# Patient Record
Sex: Male | Born: 1940 | Race: White | Hispanic: No | Marital: Married | Smoking: Former smoker
Health system: Southern US, Community
[De-identification: ages and names within clinical notes are randomized; demographics above are authoritative.]

## PROBLEM LIST (undated history)

## (undated) DIAGNOSIS — G8929 Other chronic pain: Secondary | ICD-10-CM

## (undated) DIAGNOSIS — J45909 Unspecified asthma, uncomplicated: Secondary | ICD-10-CM

## (undated) DIAGNOSIS — E785 Hyperlipidemia, unspecified: Secondary | ICD-10-CM

## (undated) DIAGNOSIS — Z955 Presence of coronary angioplasty implant and graft: Secondary | ICD-10-CM

## (undated) DIAGNOSIS — I2510 Atherosclerotic heart disease of native coronary artery without angina pectoris: Secondary | ICD-10-CM

## (undated) DIAGNOSIS — I214 Non-ST elevation (NSTEMI) myocardial infarction: Secondary | ICD-10-CM

## (undated) DIAGNOSIS — I10 Essential (primary) hypertension: Secondary | ICD-10-CM

## (undated) DIAGNOSIS — Z9861 Coronary angioplasty status: Secondary | ICD-10-CM

## (undated) DIAGNOSIS — M545 Low back pain, unspecified: Secondary | ICD-10-CM

## (undated) DIAGNOSIS — H3530 Unspecified macular degeneration: Secondary | ICD-10-CM

## (undated) DIAGNOSIS — Z9889 Other specified postprocedural states: Secondary | ICD-10-CM

## (undated) DIAGNOSIS — H35039 Hypertensive retinopathy, unspecified eye: Secondary | ICD-10-CM

## (undated) DIAGNOSIS — Z951 Presence of aortocoronary bypass graft: Secondary | ICD-10-CM

## (undated) DIAGNOSIS — R0989 Other specified symptoms and signs involving the circulatory and respiratory systems: Secondary | ICD-10-CM

## (undated) DIAGNOSIS — I25810 Atherosclerosis of coronary artery bypass graft(s) without angina pectoris: Secondary | ICD-10-CM

## (undated) HISTORY — DX: Presence of aortocoronary bypass graft: Z95.1

## (undated) HISTORY — PX: TONSILLECTOMY: SUR1361

## (undated) HISTORY — DX: Essential (primary) hypertension: I10

## (undated) HISTORY — PX: EYE SURGERY: SHX253

## (undated) HISTORY — DX: Atherosclerosis of coronary artery bypass graft(s) without angina pectoris: I25.810

## (undated) HISTORY — DX: Hypertensive retinopathy, unspecified eye: H35.039

## (undated) HISTORY — DX: Hyperlipidemia, unspecified: E78.5

## (undated) HISTORY — DX: Non-ST elevation (NSTEMI) myocardial infarction: I21.4

## (undated) HISTORY — DX: Atherosclerotic heart disease of native coronary artery without angina pectoris: Z98.61

## (undated) HISTORY — PX: CATARACT EXTRACTION: SUR2

## (undated) HISTORY — DX: Other specified symptoms and signs involving the circulatory and respiratory systems: R09.89

## (undated) HISTORY — DX: Atherosclerotic heart disease of native coronary artery without angina pectoris: I25.10

## (undated) HISTORY — DX: Low back pain, unspecified: M54.50

## (undated) HISTORY — DX: Unspecified macular degeneration: H35.30

## (undated) HISTORY — DX: Other chronic pain: G89.29

## (undated) HISTORY — DX: Low back pain: M54.5

## (undated) HISTORY — DX: Presence of coronary angioplasty implant and graft: Z95.5

## (undated) HISTORY — PX: CATARACT EXTRACTION W/ INTRAOCULAR LENS  IMPLANT, BILATERAL: SHX1307

## (undated) HISTORY — DX: Other specified postprocedural states: Z98.890

---

## 1996-03-02 DIAGNOSIS — Z951 Presence of aortocoronary bypass graft: Secondary | ICD-10-CM

## 1996-03-02 DIAGNOSIS — I2510 Atherosclerotic heart disease of native coronary artery without angina pectoris: Secondary | ICD-10-CM

## 1996-03-02 HISTORY — DX: Presence of aortocoronary bypass graft: Z95.1

## 1996-03-02 HISTORY — DX: Atherosclerotic heart disease of native coronary artery without angina pectoris: I25.10

## 1996-03-02 HISTORY — PX: CORONARY ARTERY BYPASS GRAFT: SHX141

## 1997-08-20 ENCOUNTER — Ambulatory Visit (HOSPITAL_COMMUNITY): Admission: RE | Admit: 1997-08-20 | Discharge: 1997-08-20 | Payer: Self-pay | Admitting: Cardiology

## 2001-03-02 DIAGNOSIS — I25810 Atherosclerosis of coronary artery bypass graft(s) without angina pectoris: Secondary | ICD-10-CM

## 2001-03-02 HISTORY — DX: Atherosclerosis of coronary artery bypass graft(s) without angina pectoris: I25.810

## 2001-03-02 HISTORY — PX: LEFT HEART CATH AND CORS/GRAFTS ANGIOGRAPHY: CATH118250

## 2001-03-02 HISTORY — PX: CORONARY ANGIOPLASTY WITH STENT PLACEMENT: SHX49

## 2001-09-21 ENCOUNTER — Encounter: Admission: RE | Admit: 2001-09-21 | Discharge: 2001-09-21 | Payer: Self-pay | Admitting: Cardiology

## 2001-09-21 ENCOUNTER — Encounter: Payer: Self-pay | Admitting: Cardiology

## 2001-09-26 ENCOUNTER — Ambulatory Visit (HOSPITAL_COMMUNITY): Admission: RE | Admit: 2001-09-26 | Discharge: 2001-09-27 | Payer: Self-pay | Admitting: Cardiology

## 2007-07-27 ENCOUNTER — Encounter: Admission: RE | Admit: 2007-07-27 | Discharge: 2007-07-27 | Payer: Self-pay | Admitting: Cardiology

## 2007-07-27 IMAGING — CR DG CHEST 2V
2 series · 2 of 2 positions shown · non-contrast
Comparison: None

CLINICAL DATA: Short of breath, history of CABG in [UE]

CHEST - 2 VIEW

[w chest pa]
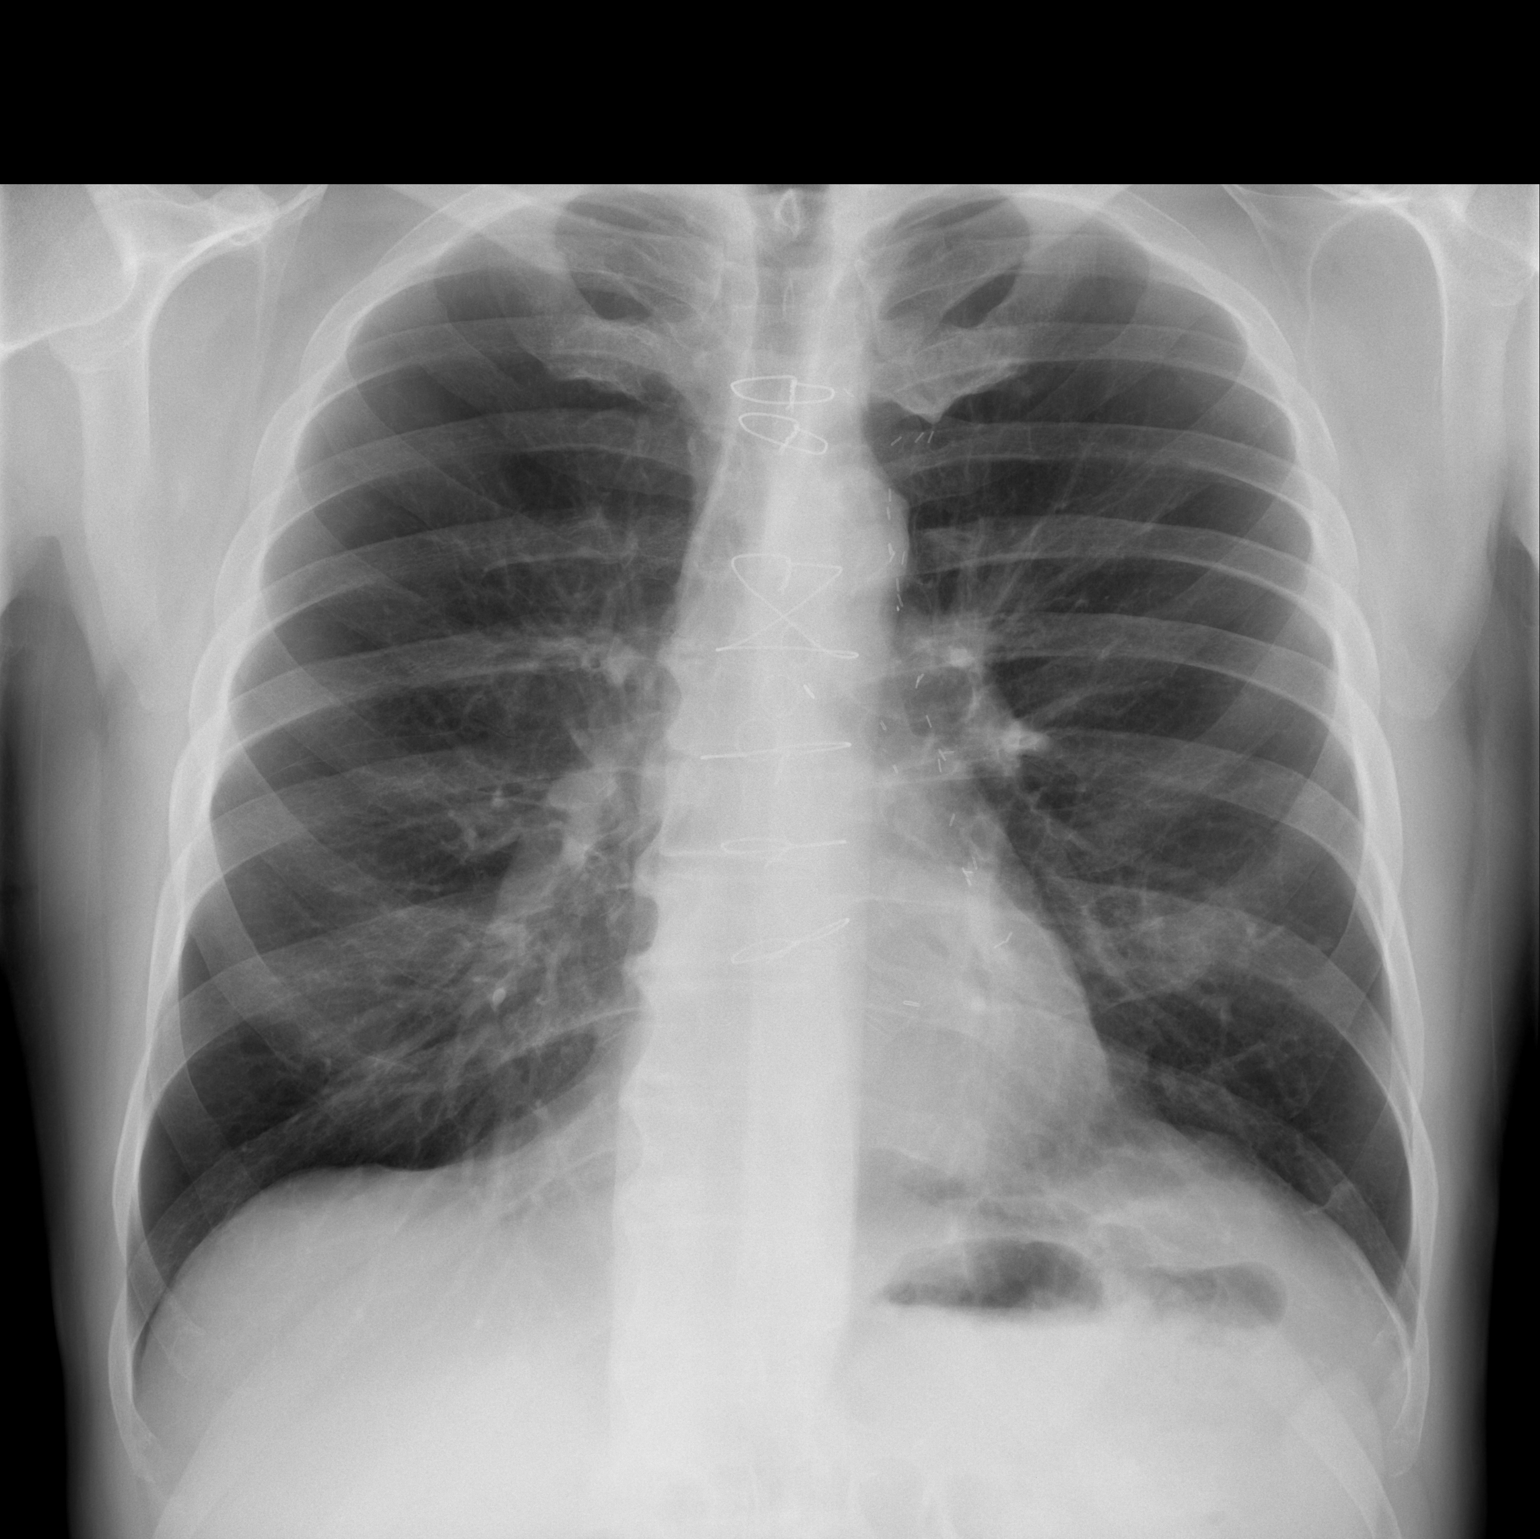

[w chest lat]
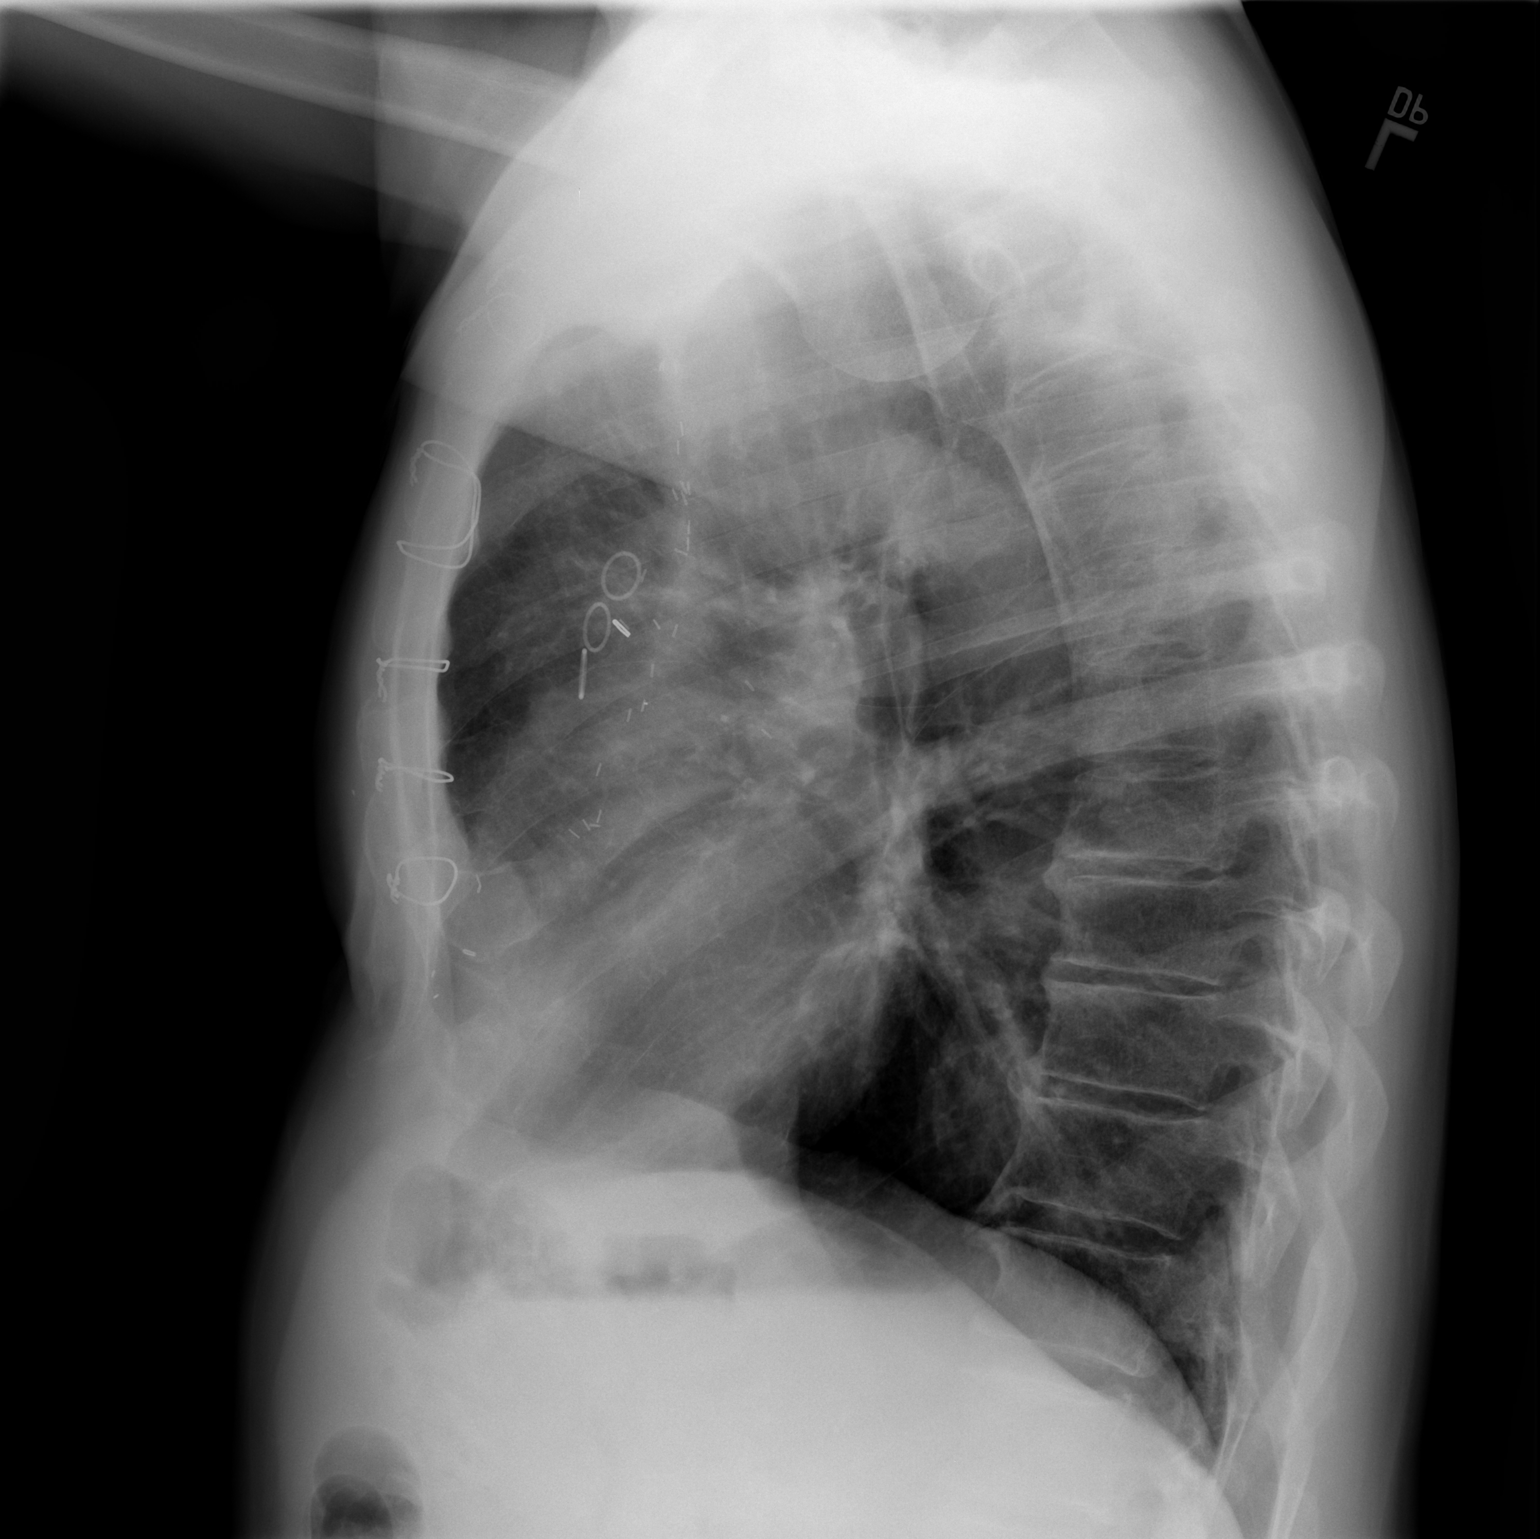

[2 of 2 positions shown; findings below may reference images not displayed]

FINDINGS: The lungs are clear but hyperaerated.  The heart is
within normal limits in size.  Median sternotomy sutures are noted
from prior CABG.  There are degenerative changes in the mid to
lower thoracic spine.
IMPRESSION: No active lung disease.  The lungs are somewhat hyperaerated.

## 2007-08-04 ENCOUNTER — Ambulatory Visit: Payer: Self-pay | Admitting: Internal Medicine

## 2010-07-18 NOTE — Cardiovascular Report (Signed)
NAME:  Xavier Giles, Xavier Giles                          ACCOUNT NO.:  [id number]   MEDICAL RECORD NO.:  [id number]                   PATIENT TYPE:  OIB   LOCATION:  6532                                 FACILITY:  [facility]   PHYSICIAN:  Thereasa Solo. Little, M.D.              DATE OF BIRTH:  12/06/1940   DATE OF PROCEDURE:  09/26/2001  DATE OF DISCHARGE:  09/27/2001                              CARDIAC CATHETERIZATION   INDICATIONS:  The patient is a 70 year old male who had bypass surgery five  years ago.  He has had increasing episodes of tiredness and fatigue.  He  underwent a nuclear study which was positive for anterolateral ischemia.  Because of this, he was brought in for outpatient cardiac catheterization  and graft visualization.   DESCRIPTION OF PROCEDURE:  The patient was prepped and draped in the usual  sterile fashion, exposing the right groin.  Following local anesthetic with  1% Xylocaine, a 5 French introducer sheath was placed into the right femoral  artery.  Selective left and right coronary arteriography and graft  visualization using a right coronary catheter was performed.   Ventriculography in the RAO projection was performed.   RESULTS:  1. Hemodynamic monitoring:  Central aortic pressure 147/74, left ventricular     pressure 147/15 with no aortic valve gradient noted at the time of     pullback.  2. Ventriculography:  Ventriculography in the RAO projection using 25 cc of     contrast at 12 cc/sec revealed good opacification of the left ventricle.     The posterior basilar and inferior walls were hypokinetic.  The ejection     fraction was 45-50%.  The end-diastolic pressure was 15.   CORONARY ARTERIOGRAPHY:  On fluoroscopy, there was calcification in the  distribution of the left main and LAD.  1. Left main normal.  2. LAD:  In in the distal portion of the LAD there was bidirectional flow     and no visualization of the distal portion of the vessel.  There was 60%  irregularity in the proximal portion.  The first diagonal was well     visualized with 50% proximal and 80% distal narrowing.  3. Circumflex:  The circumflex was a large vessel that stayed in the AV     groove.  OM1 and OM2 were faintly visualized late and retrograde from     left-to-left collaterals.  They were totally occluded proximally.  Small     OM3 and OM4 were well visualized.  Mild 40% or less irregularities in the     AV groove were noted.  4. Right coronary artery:  The right coronary artery was 100% occluded     proximally with skipped collaterals to the distal RCA and the PDA.  There     were also a large left-to-right collateral beds of the PDA.   GRAFT VISUALIZATION:  1. Saphenous vein graft to  the diagonal:  The graft itself was widely     patent.  Just distal to the insertion site, the saphenous vein graft in     the diagonal itself was an area of 80% narrowing.  There was retrograde     filling of the LAD and circumflex via this bypass graft.  2. Saphenous vein graft to the OM:  In the distal portion of the graft just     before the anastomotic site and the anastomotic site showed an area of     90% narrowing.  The OM vessel itself was free of significant disease and     was about the same size as the bypass graft.  This graft was sequential     and OM2 is where the problem was.  OM1 was a relatively small vessel and     this anastomotic site was fine.  3. Saphenous vein graft to the PDA:  This graft was never visualized.  It     appeared to be 100% occluded at the ostium.  4. Left internal mammary artery to the LAD:  The internal mammary artery     itself was widely patent.  The LAD itself had 40% irregularities with     some left-to-left collaterals filling some faint OM's.   Because of the high-grade stenosis in both the saphenous vein graft to the  OM and in the diagonal itself, arrangements were made for intervention.  The  entire case time was 2 hours and 15  minutes.   Initially the 5 French sheath was changed out for a 6 [place] sheath.  A left  coronary bypass catheter was tried, but gave very poor backup.  A right  coronary bypass catheter also gave poor backup.  With attempts at using a J-  tipped short Luque wire, the wire could easily be passed down the vessel,  but each time the stent was placed the guide catheters would back out.  Finally a Medtronic hockey stick guide catheter was used and a Art therapist.  The wire was placed down the distal portion of the vessel.  At this point, a  3.0 x 8 Zeta stent was made ready.  It was placed in such a manner that it  covered the area of narrowing in the distal portion of the saphenous vein  graft to the OM and actually extended into the proximal portion of OM2.  Once appropriate placement was visualized, the stent was deployed at 10 x 62  and finally 13 x 62.  After stent placement, the vessel appeared to be  slightly hyperexpanded at the stent site.  It had been 90% previous.  There  was brisk TIMI-3 pre and post and there was no evidence of any dissection or  thrombus formation.   At this point, we decided to proceed on with treatment of the diagonal  itself.  The same hockey stick guide catheter was used and the Sport wire.  The wire was placed down the saphenous vein graft to the diagonal and into  the distal portion of the diagonal.  The cutting balloon was placed through  the stent into the diagonal and a total of four inflations ranging from 6 x  60-8 x 62 were performed.  This area of 90% narrowing that was initially  seen now appeared to be normal post angioplasty.  There was brisk distal  flow and no dissection of thrombus.   Angiomax was used during the case.  Even with inflations,  the patient had no  symptoms.  A total of 300 cc of contrast media was used.  The sheath will be  removed later today.                                               Thereasa Solo. Little, M.D.    ABL/MEDQ   D:  09/26/2001  T:  10/03/2001  Job:  [id number]   cc:   Catheterization Laboratory   Kathlee Nations Suann Larry, M.D.   Ronnald Nian, M.D.

## 2010-12-29 ENCOUNTER — Telehealth: Payer: Self-pay | Admitting: Internal Medicine

## 2010-12-29 NOTE — Telephone Encounter (Signed)
Pt hasnt not been here in years. No current labs we have for the SE heart for his appt

## 2011-03-26 DIAGNOSIS — E782 Mixed hyperlipidemia: Secondary | ICD-10-CM | POA: Diagnosis not present

## 2011-06-10 DIAGNOSIS — I2510 Atherosclerotic heart disease of native coronary artery without angina pectoris: Secondary | ICD-10-CM | POA: Diagnosis not present

## 2011-06-10 DIAGNOSIS — E782 Mixed hyperlipidemia: Secondary | ICD-10-CM | POA: Diagnosis not present

## 2011-06-10 DIAGNOSIS — I10 Essential (primary) hypertension: Secondary | ICD-10-CM | POA: Diagnosis not present

## 2011-09-22 DIAGNOSIS — L0291 Cutaneous abscess, unspecified: Secondary | ICD-10-CM | POA: Diagnosis not present

## 2011-10-14 DIAGNOSIS — I2510 Atherosclerotic heart disease of native coronary artery without angina pectoris: Secondary | ICD-10-CM | POA: Diagnosis not present

## 2011-10-14 DIAGNOSIS — I10 Essential (primary) hypertension: Secondary | ICD-10-CM | POA: Diagnosis not present

## 2012-01-01 DIAGNOSIS — R0989 Other specified symptoms and signs involving the circulatory and respiratory systems: Secondary | ICD-10-CM

## 2012-01-01 DIAGNOSIS — I214 Non-ST elevation (NSTEMI) myocardial infarction: Secondary | ICD-10-CM

## 2012-01-01 HISTORY — DX: Non-ST elevation (NSTEMI) myocardial infarction: I21.4

## 2012-01-01 HISTORY — DX: Other specified symptoms and signs involving the circulatory and respiratory systems: R09.89

## 2012-01-13 ENCOUNTER — Encounter (HOSPITAL_COMMUNITY)
Admission: EM | Disposition: A | Discharge status: Home / Self Care | Payer: Self-pay | Source: Home / Self Care | Attending: Cardiovascular Disease

## 2012-01-13 ENCOUNTER — Inpatient Hospital Stay (HOSPITAL_COMMUNITY)
Admission: EM | Admit: 2012-01-13 | Discharge: 2012-01-15 | DRG: 247 | Disposition: A | Discharge status: Home / Self Care | Payer: Medicare Other | Attending: Cardiovascular Disease | Admitting: Cardiovascular Disease

## 2012-01-13 ENCOUNTER — Emergency Department (HOSPITAL_COMMUNITY): Payer: Medicare Other

## 2012-01-13 ENCOUNTER — Encounter (HOSPITAL_COMMUNITY): Payer: Self-pay | Admitting: Emergency Medicine

## 2012-01-13 DIAGNOSIS — Z9861 Coronary angioplasty status: Secondary | ICD-10-CM

## 2012-01-13 DIAGNOSIS — I25810 Atherosclerosis of coronary artery bypass graft(s) without angina pectoris: Secondary | ICD-10-CM | POA: Diagnosis present

## 2012-01-13 DIAGNOSIS — R079 Chest pain, unspecified: Secondary | ICD-10-CM | POA: Diagnosis not present

## 2012-01-13 DIAGNOSIS — I10 Essential (primary) hypertension: Secondary | ICD-10-CM | POA: Diagnosis present

## 2012-01-13 DIAGNOSIS — I214 Non-ST elevation (NSTEMI) myocardial infarction: Secondary | ICD-10-CM | POA: Diagnosis not present

## 2012-01-13 DIAGNOSIS — I2510 Atherosclerotic heart disease of native coronary artery without angina pectoris: Secondary | ICD-10-CM | POA: Diagnosis present

## 2012-01-13 DIAGNOSIS — E7800 Pure hypercholesterolemia, unspecified: Secondary | ICD-10-CM | POA: Diagnosis present

## 2012-01-13 DIAGNOSIS — R072 Precordial pain: Secondary | ICD-10-CM | POA: Diagnosis not present

## 2012-01-13 HISTORY — PX: CORONARY STENT INTERVENTION: CATH118234

## 2012-01-13 HISTORY — PX: LEFT HEART CATHETERIZATION WITH CORONARY/GRAFT ANGIOGRAM: SHX5450

## 2012-01-13 LAB — PROTIME-INR
INR: 1.09 (ref 0.00–1.49)
INR: 1.14 (ref 0.00–1.49)
Prothrombin Time: 14 seconds (ref 11.6–15.2)

## 2012-01-13 LAB — TROPONIN I
Troponin I: 0.7 ng/mL (ref <0.30)
Troponin I: 1.2 ng/mL (ref <0.30)

## 2012-01-13 LAB — CBC
HCT: 42.6 % (ref 39.0–52.0)
Hemoglobin: 14.8 g/dL (ref 13.0–17.0)
MCHC: 34.7 g/dL (ref 30.0–36.0)
RBC: 5.1 MIL/uL (ref 4.22–5.81)
WBC: 7.5 10*3/uL (ref 4.0–10.5)

## 2012-01-13 LAB — TSH: TSH: 1.783 u[IU]/mL (ref 0.350–4.500)

## 2012-01-13 LAB — BASIC METABOLIC PANEL
BUN: 17 mg/dL (ref 6–23)
CO2: 27 mEq/L (ref 19–32)
Chloride: 103 mEq/L (ref 96–112)
GFR calc non Af Amer: 58 mL/min — ABNORMAL LOW (ref >90)
Glucose, Bld: 178 mg/dL — ABNORMAL HIGH (ref 70–99)
Potassium: 4.2 mEq/L (ref 3.5–5.1)
Sodium: 140 mEq/L (ref 135–145)

## 2012-01-13 LAB — APTT: aPTT: 31 seconds (ref 24–37)

## 2012-01-13 LAB — HEMOGLOBIN A1C: Mean Plasma Glucose: 140 mg/dL — ABNORMAL HIGH (ref <117)

## 2012-01-13 LAB — POCT ACTIVATED CLOTTING TIME: Activated Clotting Time: 324 seconds

## 2012-01-13 LAB — MRSA PCR SCREENING: MRSA by PCR: NEGATIVE

## 2012-01-13 IMAGING — CR DG CHEST 2V
2 series · 2 of 2 positions shown · non-contrast
Comparison: [DATE]

CLINICAL DATA: Chest pain

CHEST - 2 VIEW

[w chest pa]
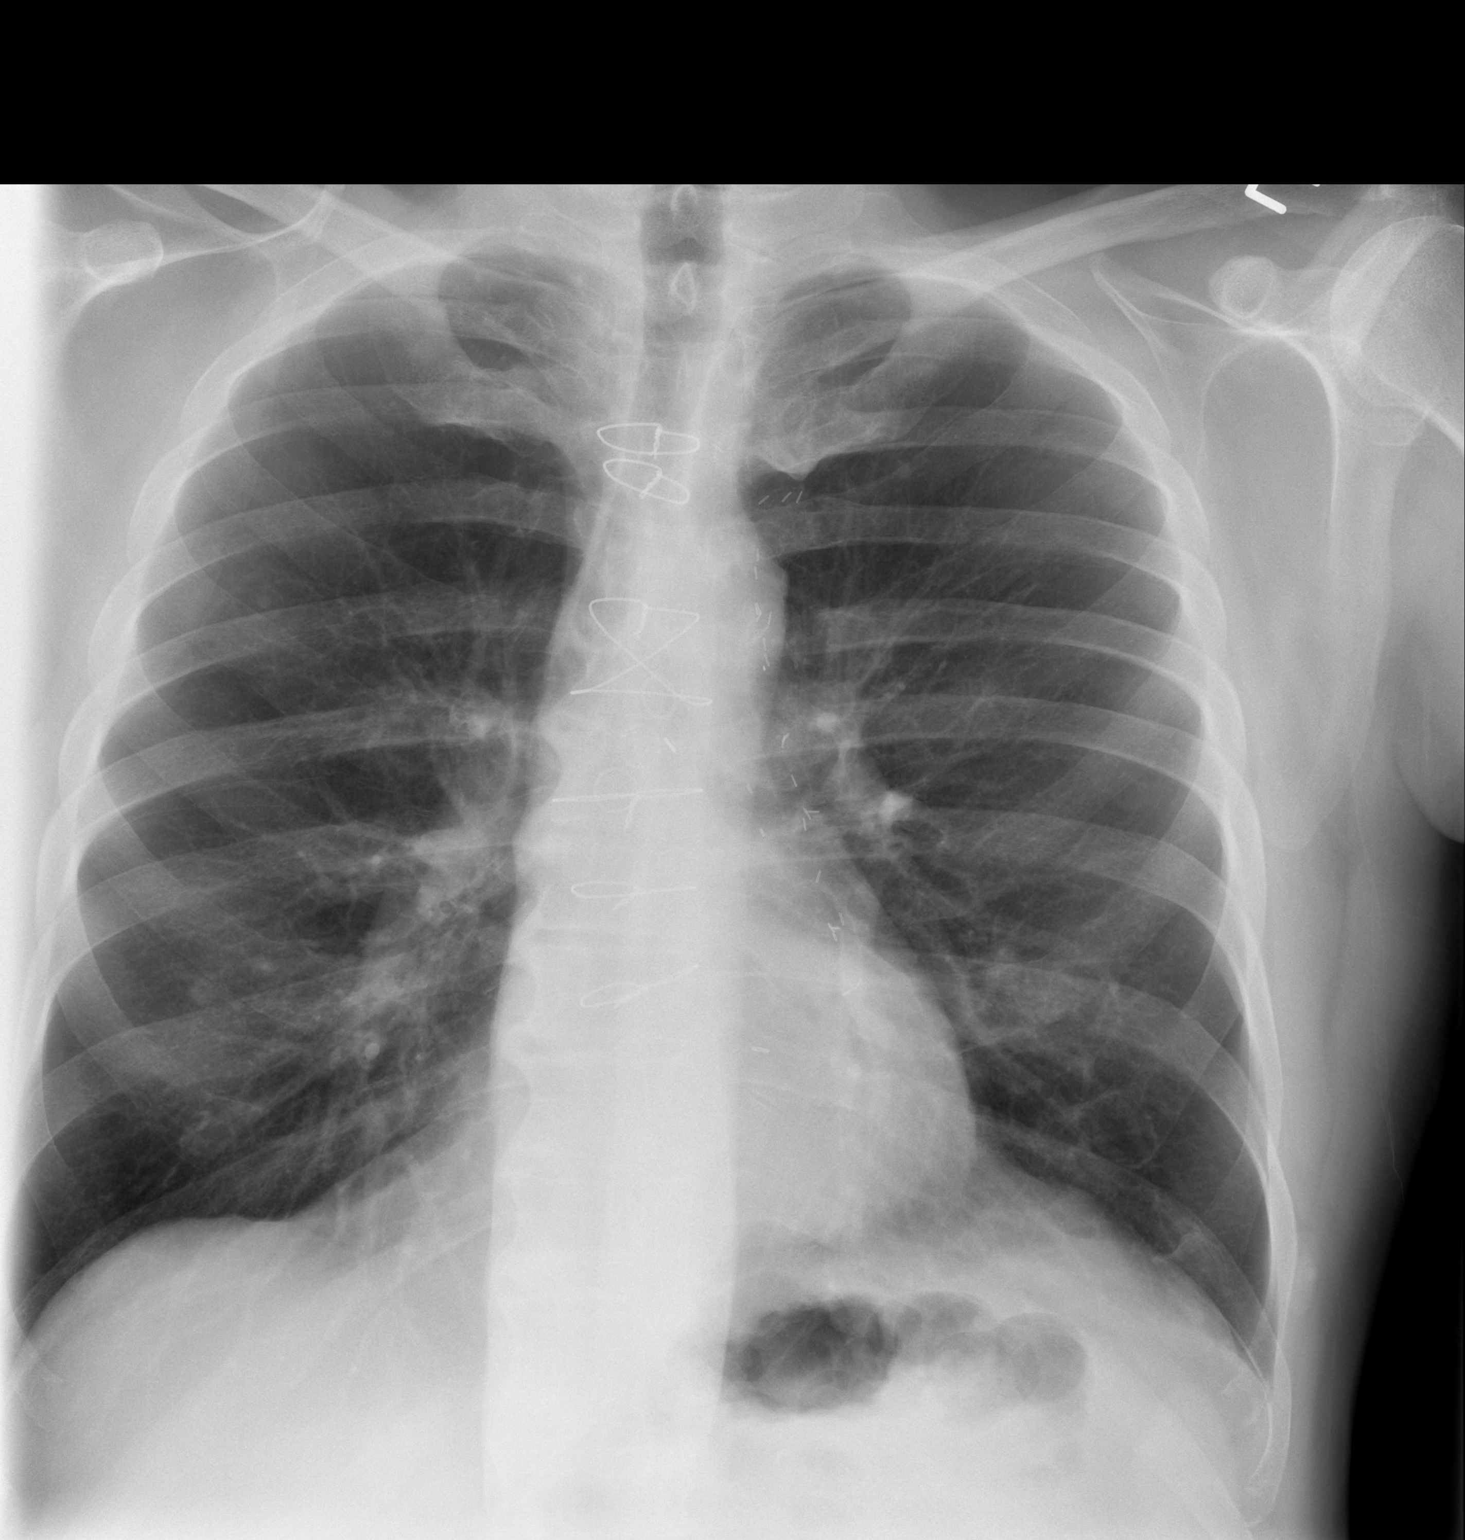

[w chest lat]
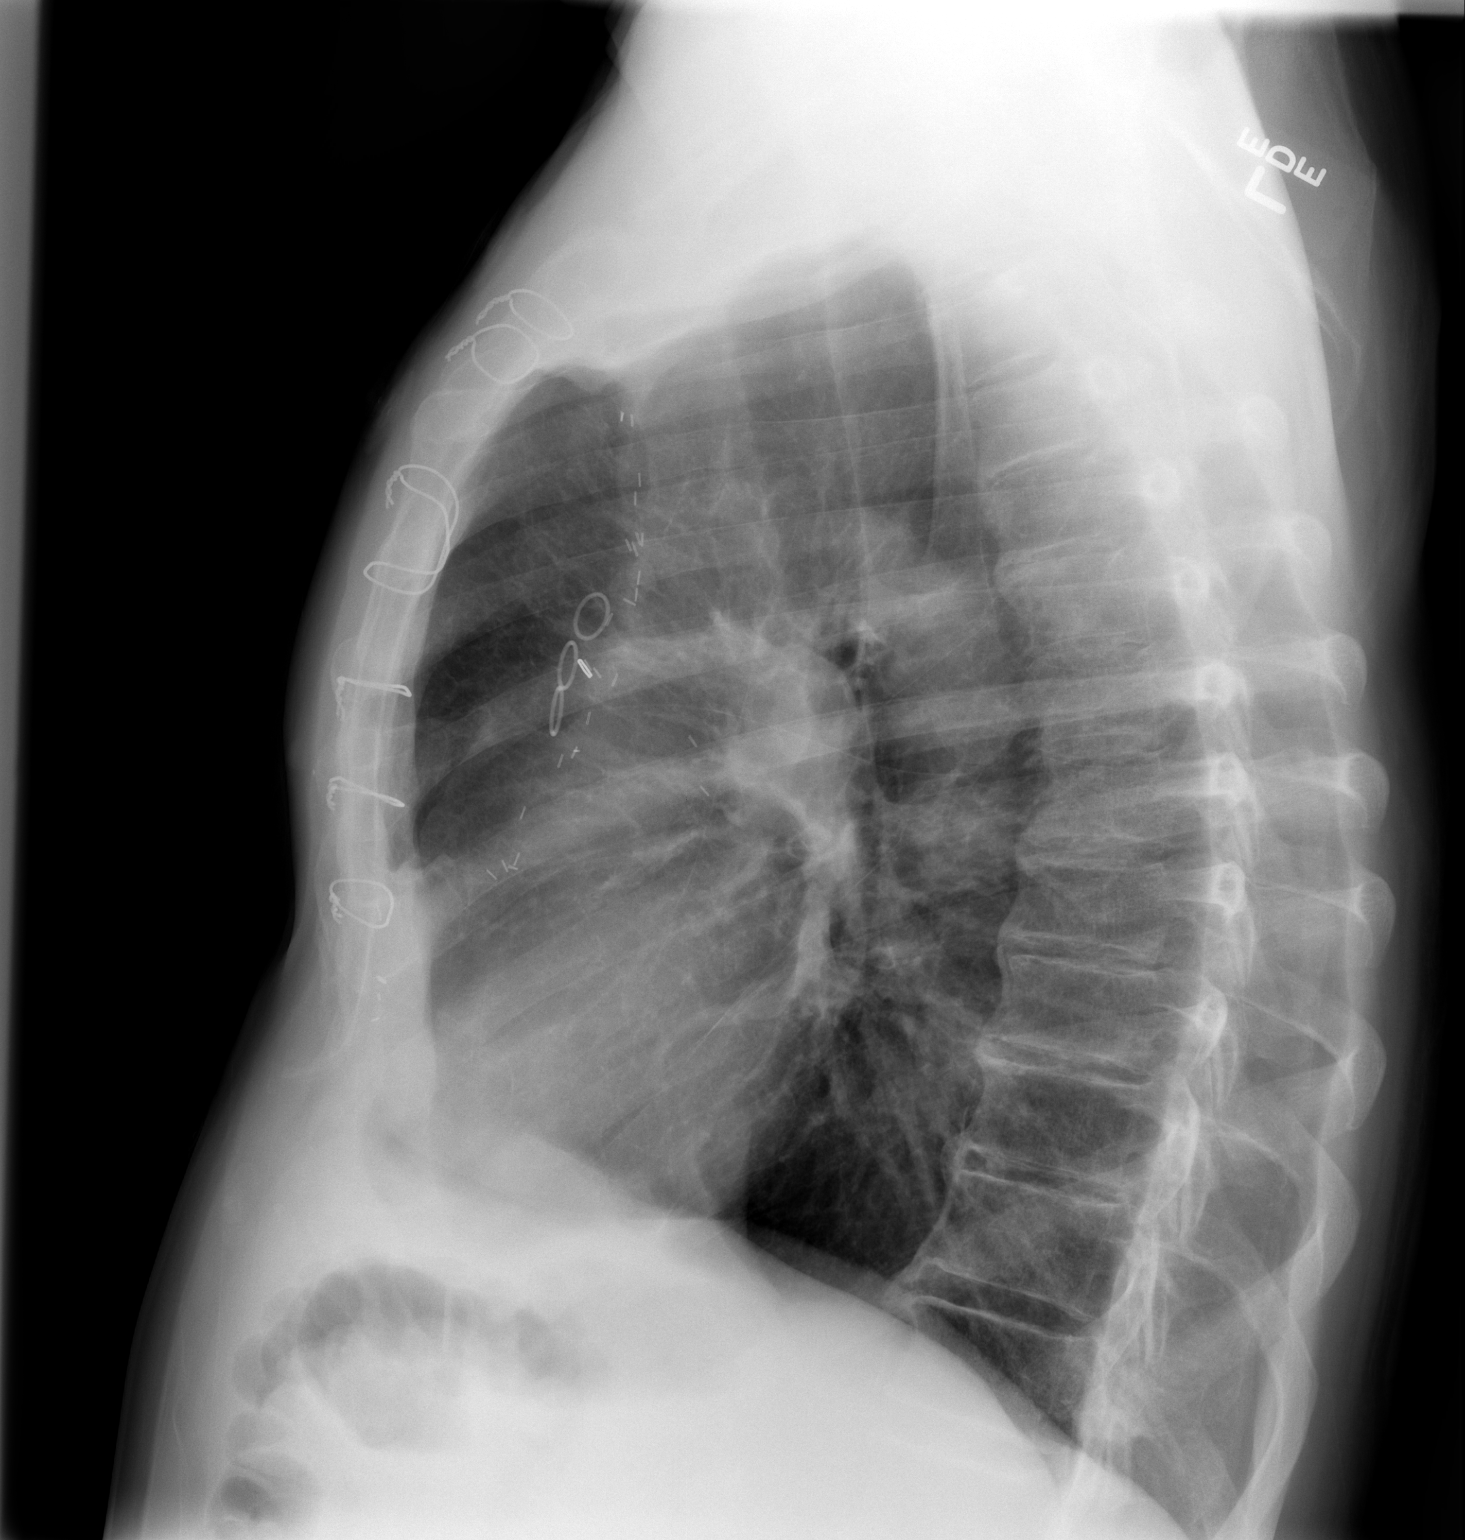

[2 of 2 positions shown; findings below may reference images not displayed]

FINDINGS: Lungs are essentially clear. No pleural effusion or
pneumothorax.

Cardiomediastinal silhouette is within normal limits. Postsurgical
changes related to prior CABG.

Degenerative changes of the visualized thoracolumbar spine.
IMPRESSION: No evidence of acute cardiopulmonary disease.

## 2012-01-13 SURGERY — LEFT HEART CATHETERIZATION WITH CORONARY/GRAFT ANGIOGRAM
Anesthesia: LOCAL

## 2012-01-13 MED ORDER — BENAZEPRIL HCL 20 MG PO TABS
20.0000 mg | ORAL_TABLET | Freq: Every day | ORAL | Status: DC
  Administered 2012-01-14 – 2012-01-15 (×2): 20 mg via ORAL
  Filled 2012-01-13 (×2): qty 1

## 2012-01-13 MED ORDER — TICAGRELOR 90 MG PO TABS
90.0000 mg | ORAL_TABLET | Freq: Two times a day (BID) | ORAL | Status: DC
  Administered 2012-01-14 – 2012-01-15 (×3): 90 mg via ORAL
  Filled 2012-01-13 (×4): qty 1

## 2012-01-13 MED ORDER — HYDROCHLOROTHIAZIDE 12.5 MG PO CAPS
12.5000 mg | ORAL_CAPSULE | Freq: Every day | ORAL | Status: DC
  Administered 2012-01-14 – 2012-01-15 (×2): 12.5 mg via ORAL
  Filled 2012-01-13 (×2): qty 1

## 2012-01-13 MED ORDER — ASPIRIN 81 MG PO CHEW
81.0000 mg | CHEWABLE_TABLET | Freq: Every day | ORAL | Status: DC
  Administered 2012-01-14 – 2012-01-15 (×2): 81 mg via ORAL
  Filled 2012-01-13 (×2): qty 1

## 2012-01-13 MED ORDER — HEPARIN (PORCINE) IN NACL 100-0.45 UNIT/ML-% IJ SOLN
1000.0000 [IU]/h | INTRAMUSCULAR | Status: DC
  Administered 2012-01-13: 1000 [IU]/h via INTRAVENOUS
  Filled 2012-01-13: qty 250

## 2012-01-13 MED ORDER — ALPRAZOLAM 0.25 MG PO TABS: 0.2500 mg | ORAL_TABLET | Freq: Two times a day (BID) | ORAL | Status: DC | PRN

## 2012-01-13 MED ORDER — SODIUM CHLORIDE 0.9 % IJ SOLN: 3.0000 mL | Freq: Two times a day (BID) | INTRAMUSCULAR | Status: DC

## 2012-01-13 MED ORDER — ONDANSETRON HCL 4 MG/2ML IJ SOLN: 4.0000 mg | Freq: Four times a day (QID) | INTRAMUSCULAR | Status: DC | PRN

## 2012-01-13 MED ORDER — ZOLPIDEM TARTRATE 5 MG PO TABS: 5.0000 mg | ORAL_TABLET | Freq: Every evening | ORAL | Status: DC | PRN

## 2012-01-13 MED ORDER — ACETAMINOPHEN 325 MG PO TABS: 650.0000 mg | ORAL_TABLET | ORAL | Status: DC | PRN

## 2012-01-13 MED ORDER — INFLUENZA VIRUS VACC SPLIT PF IM SUSP
0.5000 mL | INTRAMUSCULAR | Status: AC
  Administered 2012-01-15: 0.5 mL via INTRAMUSCULAR
  Filled 2012-01-13 (×2): qty 0.5

## 2012-01-13 MED ORDER — BIVALIRUDIN 250 MG IV SOLR
INTRAVENOUS | Status: AC
  Filled 2012-01-13: qty 250

## 2012-01-13 MED ORDER — HEPARIN BOLUS VIA INFUSION
4000.0000 [IU] | Freq: Once | INTRAVENOUS | Status: AC
  Administered 2012-01-13: 4000 [IU] via INTRAVENOUS

## 2012-01-13 MED ORDER — NITROGLYCERIN 0.4 MG SL SUBL: 0.4000 mg | SUBLINGUAL_TABLET | SUBLINGUAL | Status: DC | PRN

## 2012-01-13 MED ORDER — AMLODIPINE BESY-BENAZEPRIL HCL 10-20 MG PO CAPS: 1.0000 | ORAL_CAPSULE | Freq: Every day | ORAL | Status: DC

## 2012-01-13 MED ORDER — NITROGLYCERIN 0.2 MG/ML ON CALL CATH LAB
INTRAVENOUS | Status: AC
  Filled 2012-01-13: qty 1

## 2012-01-13 MED ORDER — NON FORMULARY: 40.0000 mg | Freq: Every day | Status: DC

## 2012-01-13 MED ORDER — MORPHINE SULFATE 2 MG/ML IJ SOLN: 2.0000 mg | INTRAMUSCULAR | Status: DC | PRN

## 2012-01-13 MED ORDER — PRAVASTATIN SODIUM 40 MG PO TABS
40.0000 mg | ORAL_TABLET | Freq: Every day | ORAL | Status: DC
  Administered 2012-01-13 – 2012-01-14 (×2): 40 mg via ORAL
  Filled 2012-01-13 (×3): qty 1

## 2012-01-13 MED ORDER — SODIUM CHLORIDE 0.9 % IV SOLN: 250.0000 mL | INTRAVENOUS | Status: DC

## 2012-01-13 MED ORDER — SODIUM CHLORIDE 0.9 % IV SOLN
INTRAVENOUS | Status: DC
  Administered 2012-01-13 – 2012-01-14 (×2): via INTRAVENOUS

## 2012-01-13 MED ORDER — NITROGLYCERIN IN D5W 200-5 MCG/ML-% IV SOLN
5.0000 ug/min | INTRAVENOUS | Status: DC
  Administered 2012-01-13: 5 ug/min via INTRAVENOUS
  Filled 2012-01-13: qty 250

## 2012-01-13 MED ORDER — TICAGRELOR 90 MG PO TABS
ORAL_TABLET | ORAL | Status: AC
  Filled 2012-01-13: qty 2

## 2012-01-13 MED ORDER — ASPIRIN 81 MG PO CHEW: 324.0000 mg | CHEWABLE_TABLET | ORAL | Status: DC

## 2012-01-13 MED ORDER — HEPARIN (PORCINE) IN NACL 2-0.9 UNIT/ML-% IJ SOLN
INTRAMUSCULAR | Status: AC
  Filled 2012-01-13: qty 1000

## 2012-01-13 MED ORDER — ASPIRIN 81 MG PO CHEW: 324.0000 mg | CHEWABLE_TABLET | Freq: Once | ORAL | Status: DC

## 2012-01-13 MED ORDER — ASPIRIN EC 81 MG PO TBEC: 81.0000 mg | DELAYED_RELEASE_TABLET | Freq: Every day | ORAL | Status: DC

## 2012-01-13 MED ORDER — DIAZEPAM 5 MG PO TABS
5.0000 mg | ORAL_TABLET | ORAL | Status: DC
  Filled 2012-01-13: qty 1

## 2012-01-13 MED ORDER — AMLODIPINE BESYLATE 10 MG PO TABS
10.0000 mg | ORAL_TABLET | Freq: Every day | ORAL | Status: DC
  Administered 2012-01-14 – 2012-01-15 (×2): 10 mg via ORAL
  Filled 2012-01-13 (×2): qty 1

## 2012-01-13 MED ORDER — SODIUM CHLORIDE 0.9 % IV SOLN
INTRAVENOUS | Status: DC
  Administered 2012-01-13: 100 mL/h via INTRAVENOUS

## 2012-01-13 MED ORDER — LIDOCAINE HCL (PF) 1 % IJ SOLN
INTRAMUSCULAR | Status: AC
  Filled 2012-01-13: qty 30

## 2012-01-13 MED ORDER — ASPIRIN 81 MG PO CHEW: 81.0000 mg | CHEWABLE_TABLET | Freq: Every day | ORAL | Status: DC

## 2012-01-13 MED ORDER — ASPIRIN 81 MG PO CHEW
243.0000 mg | CHEWABLE_TABLET | Freq: Once | ORAL | Status: AC
  Administered 2012-01-13: 243 mg via ORAL
  Filled 2012-01-13: qty 3

## 2012-01-13 MED ORDER — ATENOLOL 50 MG PO TABS
50.0000 mg | ORAL_TABLET | Freq: Every day | ORAL | Status: DC
  Administered 2012-01-14 – 2012-01-15 (×2): 50 mg via ORAL
  Filled 2012-01-13 (×2): qty 1

## 2012-01-13 MED ORDER — SODIUM CHLORIDE 0.9 % IJ SOLN: 3.0000 mL | INTRAMUSCULAR | Status: DC | PRN

## 2012-01-13 NOTE — ED Notes (Signed)
Pt c/o mid sternal CP with nausea and SOB x 1 week

## 2012-01-13 NOTE — Op Note (Signed)
Xavier Giles is a 71 y.o. male    [id number] LOCATION:  FACILITY: [facility]  PHYSICIAN: Xavier Giles, M.D. 29-Mar-1940   DATE OF PROCEDURE:  01/13/2012  DATE OF DISCHARGE:  [facility]  CARDIAC CATHETERIZATION    .   History obtained from chart review.71 year old white married male presents to the emergency room today after 4 day history of episodic chest pain. Initially the pain occurred with activity and would resolve with rest. Last night during the night he had chest discomfort premeds all night describes as a midsternal pressure with radiation across the chest none into his arms or back, mild nausea, no diaphoresis, mild shortness of breath. He does not take nitroglycerin sublingual. Here in the emergency room he is currently pain-free but his troponin is 0.67 and he has EKG changes with inferior lateral ischemia. His IV heparin has been started.  Cardiac history does include bypass grafting in 1998 with vein graft to the diagonal vein graft to the OM vein graft to the PDA and LIMA to the LAD. His last cardiac cath was 2003 after positive stress test he was found to have 100% occluded vein graft to the PDA, LIMA was patent, vein graft to the diagonal had an area of 80% narrowing and underwent cutting balloon angioplasty and the vein graft to the OM was 90% stenotic and underwent PTCA and stent.  The patient has done quite well since 2003 without chest pain, and his last nuclear stress test was 2012 and there was no ischemia, though the EKG was positive with apparent conduction in the inferior leads with ST elevation in aVL and aVR. EF in 2003 by cardiac cath was 45-50%   PROCEDURE DESCRIPTION:    The patient was brought to the second floor  [facility] lab in the postabsorptive state. He was  premedicated with Valium 5 mg by mouth.. His right groin was prepped and shaved in usual sterile fashion. Xylocaine 1% was used  for local anesthesia. A 6  French sheath was inserted into the right common femoral  artery using standard Seldinger technique. The 5 French right and left Judkins diagnostic catheters all the 5 French pigtail catheter were used for selective coronary angiography, left ventriculography, selective vein graft and LIMA angiography. Visipaque dye was used for the entirety of the case. Retrograde aortic, left ventricular, and pullback pressures were recorded.   HEMODYNAMICS:    AO SYSTOLIC/AO DIASTOLIC: 112/59   LV SYSTOLIC/LV DIASTOLIC: 115/12  ANGIOGRAPHIC RESULTS:   1. Left main; 50% tapered distal  2. LAD; the 70-80% ostial/proximal, 90% mid focal after diagonal branch #2 with competitive flow distally. The first diagonal branch was moderate in size and a 50% segmental proximal stenosis. There was a tented segment with what was appeared to be an occluded SVG. 3. Left circumflex; occluded in the mid AV groove after OM1 with bridging collaterals to the posterolateral branches..  4. Right coronary artery; codominant with a Mignogna segment of occlusion in the midportion and bridging collaterals 5.LIMA TO LAD; widely patent with a 70% hypodense lesion just after the anastomosis 6. SVG TO RCA was totally occluded at the origin and this was an old finding     SVG TO diagonal branch was occluded at the origin     SVG TO 12 and 3 was patent with a 90% stenosis in the vein graft between the 2 anastomoses.  7. Left ventriculography; RAO left ventriculogram was performed using  25 mL of Visipaque dye  at 12 mL/second. The overall LVEF estimated  60 % Without wall motion abnormalities  IMPRESSION:Xavier Giles has occluded vein graft to the RCA and diagonal branch, patent sequential vein to the obtuse marginal branches with a high-grade lesion between the 2 anastomoses distally and a patent LIMA the LAD was a moderate lesion just after insertion. I believe the culprit vessel this is obtuse marginal branch and vein graft which intervene on. I  do not think an FFR of the LAD lesion the the LIMA given competitive flow be physiologically accurate.  Procedure description: the patient received 180 mg of Brilenta loading dose. He had already received aspirin. Using a 6 French left bypass graft guiding catheter along with an 014 Clinical cytogeneticist guiding wire and a 2.0 x 12 mm Lo balloon predilatation was performed on the distal SVG to obtuse marginal branches 2 and 3. Stenting was then performed with a 2.75 mm x 15 mm Harries Xience Xpedition drug-eluting stent at 15 atmospheres post dilatation being performed with a 3.0 mm but 12 mm Rieman noncompliant balloon at 16 atmospheres (3.1 mm) resulting reduction of a 90% stenosis to 0% residual with excellent tone or dissection.  Final impression: Vessel PCI and stenting of the SVG to obtuse marginal branch with drug-eluting stent, Angiomax, and Brilenta. The RCA and diagonal branch vein grafts are occluded and there is moderate disease in the diagonal branch proximally and in the distal LAD beyond the LIMA insertion for CABG medically. The sheath was removed and the right common femoral arterial puncture site was sealed with the "[facility] " closure device with excellent hemostasis. The patient left the Cath Lab in stable condition on reduced dose Angiomax until the existing bag is completed.Xavier Gess MD, [facility] 01/13/2012 6:01 PM

## 2012-01-13 NOTE — ED Provider Notes (Signed)
History     CSN: [id number]  Arrival date & time 01/13/12  0940   First MD Initiated Contact with Patient 01/13/12 [phone]      Chief Complaint  Patient presents with  . Chest Pain    (Consider location/radiation/quality/duration/timing/severity/associated sxs/prior treatment) HPI Comments: 71 year old male presents emergency department with his wife complaining of sudden onset chest pain while laying in bed on Friday. His pain has been intermittent since, worse when he is up and moving. Chest pain described as dull and nonradiating. He has not tried any alleviating factors. States this feels similar to his prior heart attack 15 years ago when he had bypass surgery. He sees cardiology a regular basis, however Dr. little is retiring and he'll be seeing a new cardiologist at the same practice. He takes an aspirin 81 mg daily. Admits to associated shortness of breath it has been present for one week and feeling slightly nauseated. Denies vomiting or diaphoresis. Denies headache, lightheadedness, dizziness.  The history is provided by the patient and the spouse.    Past Medical History  Diagnosis Date  . Coronary artery disease   . Hypertension   . Hypercholesteremia     Past Surgical History  Procedure Date  . Coronary artery bypass graft     History reviewed. No pertinent family history.  History  Substance Use Topics  . Smoking status: Never Smoker   . Smokeless tobacco: Not on file  . Alcohol Use: Yes     Comment: occ      Review of Systems  Respiratory: Positive for shortness of breath. Negative for cough.   Cardiovascular: Positive for chest pain.  Gastrointestinal: Positive for nausea.  All other systems reviewed and are negative.    Allergies  Sulfa antibiotics  Home Medications  No current outpatient prescriptions on file.  BP 133/68  Temp 97.4 F (36.3 C) (Oral)  Resp 20  SpO2 99%  Physical Exam  Constitutional: He is oriented to person, place, and  time. He appears well-developed and well-nourished. No distress.  HENT:  Head: Normocephalic and atraumatic.  Mouth/Throat: Oropharynx is clear and moist.       Hard of hearing  Eyes: Conjunctivae normal and EOM are normal. Pupils are equal, round, and reactive to light.  Neck: Normal range of motion. Neck supple. No JVD present.  Cardiovascular: Normal rate, regular rhythm, normal heart sounds and intact distal pulses.   Pulmonary/Chest: Effort normal and breath sounds normal. No respiratory distress. He has no wheezes. He has no rhonchi. He has no rales. He exhibits no tenderness.  Abdominal: Soft. Bowel sounds are normal. There is no tenderness.  Musculoskeletal: Normal range of motion.  Neurological: He is alert and oriented to person, place, and time.  Skin: Skin is warm and dry.  Psychiatric: He has a normal mood and affect. His behavior is normal.    ED Course  Procedures (including critical care time)  Labs Reviewed  BASIC METABOLIC PANEL - Abnormal; Notable for the following:    Glucose, Bld 178 (*)     GFR calc non Af Amer 58 (*)     GFR calc Af Amer 67 (*)     All other components within normal limits  TROPONIN I - Abnormal; Notable for the following:    Troponin I 0.67 (*)     All other components within normal limits  CBC  APTT  PROTIME-INR   Dg Chest 2 View  01/13/2012  *RADIOLOGY REPORT*  Clinical Data: Chest pain  CHEST - 2 VIEW  Comparison: 07/27/2007  Findings: Lungs are essentially clear. No pleural effusion or pneumothorax.  Cardiomediastinal silhouette is within normal limits. Postsurgical changes related to prior CABG.  Degenerative changes of the visualized thoracolumbar spine.  IMPRESSION: No evidence of acute cardiopulmonary disease.   Original Report Authenticated By: Charline Bills, M.D.     Date: 01/13/2012  Rate: 55  Rhythm: normal sinus rhythm  QRS Axis: left  Intervals: abnormal R wave progression  ST/T Wave abnormalities: ST depressions  anteriorly  Conduction Disutrbances:ventricular premature complexes  Narrative Interpretation: no stemi, abnormal ekg  Old EKG Reviewed: none available    1. NSTEMI (non-ST elevated myocardial infarction)       MDM  71 year old male with NSTEMI. ST depression noted in anterior leads on EKG. Troponin elevated at 0.67. Spoke with [facility] cardiology who will come evaluate patient for admission.       Trevor Mace, [name] 01/13/12 1146

## 2012-01-13 NOTE — Progress Notes (Signed)
Utilization review completed. P.J. Ahyan Kreeger,RN,BSN case manager [phone]

## 2012-01-13 NOTE — ED Notes (Signed)
Patient denies chest pain at present.  Took 1 81 mg asa at home.

## 2012-01-13 NOTE — H&P (Signed)
    Pt was reexamined and existing H & P reviewed. No changes found.  [name], MD [facility] 01/13/2012 4:27 PM

## 2012-01-13 NOTE — ED Notes (Signed)
Critical trop related to Dr. Bebe Shaggy and Mathis Fare PA

## 2012-01-13 NOTE — H&P (Signed)
Xavier Giles is an 71 y.o. male.    Cardiologist: Dr. Langston Reusing  -- now Dr. Herbie Baltimore Chief Complaint: chest pain HPI: 71 year old white married male presents to the emergency room today after 4 day history of episodic chest pain. Initially the pain occurred with activity and would resolve with rest. Last night during the night he had chest discomfort premeds all night describes as a midsternal pressure with radiation across the chest none into his arms or back, mild nausea, no diaphoresis, mild shortness of breath. He does not take nitroglycerin sublingual. Here in the emergency room he is currently pain-free but his troponin is 0.67 and he has EKG changes with inferior lateral ischemia. His IV heparin has been started.  Cardiac history does include bypass grafting in 1998 with vein graft to the diagonal vein graft to the OM vein graft to the PDA and LIMA to the LAD. His last cardiac cath was 2003 after positive stress test he was found to have 100% occluded vein graft to the PDA, LIMA was patent, vein graft to the diagonal had an area of 80% narrowing and underwent cutting balloon angioplasty and the vein graft to the OM was 90% stenotic and underwent PTCA and stent. The patient has done quite well since 2003 without chest pain, and his last nuclear stress test was 2012 and there was no ischemia, though the EKG was positive with apparent conduction in the inferior leads with ST elevation in aVL and aVR.  EF in 2003 by cardiac cath was 45-50%.    Past Medical History  Diagnosis Date  . Coronary artery disease   . Hypertension   . Hypercholesteremia   . NSTEMI (non-ST elevated myocardial infarction) 01/13/2012  . CAD (coronary artery disease), CABG in 1998 and stent to VG-OM with cutting balloon to diag in 2003 01/13/2012  . HTN (hypertension) 01/13/2012    Past Surgical History  Procedure Date  . Coronary artery bypass graft   . Cardiac catheterization   . Coronary angioplasty     History  reviewed. No pertinent family history. Social History:  reports that he has never smoked. He has never used smokeless tobacco. He reports that he drinks about .6 ounces of alcohol per week. He reports that he does not use illicit drugs.Married with 2 children and 3 grandchildren. He does not exercise but he is active around the house.  Allergies:  Allergies  Allergen Reactions  . Keflex (Cephalexin)   . Lipitor (Atorvastatin)   . Sulfa Antibiotics Nausea Only  . Zocor (Simvastatin)    Outpatient medications: Pravastatin 40 mg daily Hydrochlorothiazide 12 and half milligrams daily Atenolol 50 mg daily Enteric-coated aspirin 81 mg daily Lotrel 10/40 one daily  Results for orders placed during the hospital encounter of 01/13/12 (from the past 48 hour(s))  CBC     Status: Normal   Collection Time   01/13/12 10:10 AM      Component Value Range Comment   WBC 7.5  4.0 - 10.5 K/uL    RBC 5.10  4.22 - 5.81 MIL/uL    Hemoglobin 14.8  13.0 - 17.0 g/dL    HCT 40.9  81.1 - 91.4 %    MCV 83.5  78.0 - 100.0 fL    MCH 29.0  26.0 - 34.0 pg    MCHC 34.7  30.0 - 36.0 g/dL    RDW 78.2  95.6 - 21.3 %    Platelets 197  150 - 400 K/uL   BASIC METABOLIC PANEL  Status: Abnormal   Collection Time   01/13/12 10:10 AM      Component Value Range Comment   Sodium 140  135 - 145 mEq/L    Potassium 4.2  3.5 - 5.1 mEq/L    Chloride 103  96 - 112 mEq/L    CO2 27  19 - 32 mEq/L    Glucose, Bld 178 (*) 70 - 99 mg/dL    BUN 17  6 - 23 mg/dL    Creatinine, Ser 0.45  0.50 - 1.35 mg/dL    Calcium 9.6  8.4 - 40.9 mg/dL    GFR calc non Af Amer 58 (*) >90 mL/min    GFR calc Af Amer 67 (*) >90 mL/min   TROPONIN I     Status: Abnormal   Collection Time   01/13/12 10:10 AM      Component Value Range Comment   Troponin I 0.67 (*) <0.30 ng/mL    Dg Chest 2 View  01/13/2012  *RADIOLOGY REPORT*  Clinical Data: Chest pain  CHEST - 2 VIEW  Comparison: 07/27/2007  Findings: Lungs are essentially clear. No  pleural effusion or pneumothorax.  Cardiomediastinal silhouette is within normal limits. Postsurgical changes related to prior CABG.  Degenerative changes of the visualized thoracolumbar spine.  IMPRESSION: No evidence of acute cardiopulmonary disease.   Original Report Authenticated By: Charline Bills, M.D.     ROS: General:no colds or fevers, no weight changes Skin:no rashes or ulcers HEENT:no blurred vision, no congestion CV:see HPI PUL:see HPI GI:no diarrhea constipation or melena, no indigestion GU:no hematuria, no dysuria MS:no joint pain, no claudication, does have back pain, lower back pain Neuro:no syncope, no lightheadedness, no history of CVA Endo:no diabetes, no thyroid disease   Blood pressure 139/77, pulse 58, temperature 97.4 F (36.3 C), temperature source Oral, resp. rate 17, SpO2 97.00%. PE: General:Alert oriented and pleasant affect, anxious but in no acute distress Skin:Warm and dry brisk capillary refill HEENT:Normocephalic, sclera clear Neck:Supple no JVD Soft carotid bruits, Have been noted before Heart:S1-S2 regular rate and rhythm, no murmur gallop or click Lungs:Diminished but no rales rhonchi or wheezes WJX:BJYN nontender positive bowel sounds do not palpate liver spleen or masses Ext:No edema 2+ pedal pulses bilaterally 2+ radials bilaterally Neuro:Alert and oriented x3 follows commands moves all extremities    Assessment/Plan Principal Problem:  *NSTEMI (non-ST elevated myocardial infarction) Active Problems:  Hypercholesteremia  CAD (coronary artery disease), CABG in 1998 and stent to VG-OM with cutting balloon to diag in 2003  HTN (hypertension)  PLAN: Crescendo angina began with activity only then last pm pain during the night now with abnormal EKG and pos. Trop.  IV heparin has been started, He has rec'd asprin.  Will add IV NTG to prevent recurrence of angina. Plan to proceed with cath today as schedule allows. Currently pain free. [id number]  R 01/13/2012, 12:21 PM    Agree with note written by Nada Boozer RNP  Crescendo angina, H/O CAD s/p remote CABG. + eng/NSTEMI. With EKG changes. For cath today  [name] 01/13/2012 4:03 PM

## 2012-01-13 NOTE — ED Provider Notes (Signed)
Pt seen with PA He has EKG changes/ST depression History is consistent with unstable angina Will call cardiology for admission Heparin ordered He is CP free at this time Denies recent h/o GI bleed  Joya Gaskins, MD 01/13/12 1121

## 2012-01-14 DIAGNOSIS — I10 Essential (primary) hypertension: Secondary | ICD-10-CM | POA: Diagnosis not present

## 2012-01-14 DIAGNOSIS — I214 Non-ST elevation (NSTEMI) myocardial infarction: Secondary | ICD-10-CM | POA: Diagnosis not present

## 2012-01-14 DIAGNOSIS — E7800 Pure hypercholesterolemia, unspecified: Secondary | ICD-10-CM | POA: Diagnosis not present

## 2012-01-14 DIAGNOSIS — I25810 Atherosclerosis of coronary artery bypass graft(s) without angina pectoris: Secondary | ICD-10-CM | POA: Diagnosis not present

## 2012-01-14 DIAGNOSIS — I2510 Atherosclerotic heart disease of native coronary artery without angina pectoris: Secondary | ICD-10-CM | POA: Diagnosis not present

## 2012-01-14 LAB — CBC
MCHC: 35.5 g/dL (ref 30.0–36.0)
Platelets: 180 10*3/uL (ref 150–400)
RDW: 13.2 % (ref 11.5–15.5)
WBC: 7.1 10*3/uL (ref 4.0–10.5)

## 2012-01-14 LAB — LIPID PANEL
HDL: 38 mg/dL — ABNORMAL LOW (ref >39)
LDL Cholesterol: 74 mg/dL (ref 0–99)
Total CHOL/HDL Ratio: 3.5 RATIO
VLDL: 21 mg/dL (ref 0–40)

## 2012-01-14 LAB — BASIC METABOLIC PANEL
BUN: 14 mg/dL (ref 6–23)
Chloride: 102 mEq/L (ref 96–112)
GFR calc Af Amer: 79 mL/min — ABNORMAL LOW (ref >90)
GFR calc non Af Amer: 68 mL/min — ABNORMAL LOW (ref >90)
Potassium: 3.5 mEq/L (ref 3.5–5.1)
Sodium: 136 mEq/L (ref 135–145)

## 2012-01-14 MED FILL — Dextrose Inj 5%: INTRAVENOUS | Qty: 50 | Status: AC

## 2012-01-14 NOTE — Progress Notes (Signed)
CARDIAC REHAB PHASE I   PRE:  Rate/Rhythm: 88SR  BP:  Supine:   Sitting: 147/76  Standing:    SaO2:   MODE:  Ambulation: 620 ft   POST:  Rate/Rhythem: 90SR  BP:  Supine:   Sitting: 167/74  Standing:    SaO2:  1010-1105 Pt walked 620 ft with steady gait. Denied CP. Tolerated well. Education completed with pt and family except ex ed. Discussed CRP 2. Pt declined at this time. Does not want referral. Let brochure in case pt changes his mind. Will follow up tomorrow.  Duanne Limerick

## 2012-01-14 NOTE — Care Management Note (Addendum)
    Page 1 of 1   01/15/2012     2:23:18 PM   CARE MANAGEMENT NOTE 01/15/2012  Patient:  Xavier, Giles   Account Number:  [id number]  Date Initiated:  01/13/2012  Documentation initiated by:  Junius Creamer  Subjective/Objective Assessment:   adm w mi     Action/Plan:   lives w spouse   Anticipated DC Date:  01/15/2012   Anticipated DC Plan:  HOME/SELF CARE      DC Planning Services  CM consult      Choice offered to / List presented to:             Status of service:  Completed, signed off Medicare Important Message given?   (If response is "NO", the following Medicare IM given date fields will be blank) Date Medicare IM given:   Date Additional Medicare IM given:    Discharge Disposition:  HOME/SELF CARE  Per UR Regulation:  Reviewed for med. necessity/level of care/duration of stay  If discussed at [facility] Length of Stay Meetings, dates discussed:    Comments:  11/14 11:23a debbie dowell rn,bsn gave pt brilinta 30day free card and copay assist card.no ins for meds. left pt assist form for brilinta on chart for md to sign. gave pt prescription card that may help w brand name meds.  11/13 15:00 debbie dowell rn,bsn [phone]

## 2012-01-14 NOTE — Progress Notes (Signed)
Subjective:  S/P LCX-OM PCI/Stent using DES. Small NSTEMI. No CP last PM  Objective:  Temp:  [97.4 F (36.3 C)-98.6 F (37 C)] 97.4 F (36.3 C) (11/14 0724) Pulse Rate:  [55-72] 68  (11/14 0724) Resp:  [9-21] 12  (11/14 0330) BP: (108-139)/(56-92) 131/65 mmHg (11/14 0724) SpO2:  [96 %-100 %] 97 % (11/14 0724) Weight:  [81.8 kg (180 lb 5.4 oz)] 81.8 kg (180 lb 5.4 oz) (11/13 1529) Weight change:   Intake/Output from previous day: 11/13 0701 - 11/14 0700 In: 758.5 [P.O.:120; I.V.:638.5] Out: 1025 [Urine:1025]  Intake/Output from this shift:    Physical Exam: General appearance: alert, cooperative and no distress Neck: no adenopathy, no carotid bruit, no JVD, supple, symmetrical, trachea midline and thyroid not enlarged, symmetric, no tenderness/mass/nodules Lungs: clear to auscultation bilaterally Heart: regular rate and rhythm, S1, S2 normal, no murmur, click, rub or gallop Extremities: extremities normal, atraumatic, no cyanosis or edema and right groin OK  Lab Results: Results for orders placed during the hospital encounter of 01/13/12 (from the past 48 hour(s))  CBC     Status: Normal   Collection Time   01/13/12 10:10 AM      Component Value Range Comment   WBC 7.5  4.0 - 10.5 K/uL    RBC 5.10  4.22 - 5.81 MIL/uL    Hemoglobin 14.8  13.0 - 17.0 g/dL    HCT 16.1  09.6 - 04.5 %    MCV 83.5  78.0 - 100.0 fL    MCH 29.0  26.0 - 34.0 pg    MCHC 34.7  30.0 - 36.0 g/dL    RDW 40.9  81.1 - 91.4 %    Platelets 197  150 - 400 K/uL   BASIC METABOLIC PANEL     Status: Abnormal   Collection Time   01/13/12 10:10 AM      Component Value Range Comment   Sodium 140  135 - 145 mEq/L    Potassium 4.2  3.5 - 5.1 mEq/L    Chloride 103  96 - 112 mEq/L    CO2 27  19 - 32 mEq/L    Glucose, Bld 178 (*) 70 - 99 mg/dL    BUN 17  6 - 23 mg/dL    Creatinine, Ser 7.82  0.50 - 1.35 mg/dL    Calcium 9.6  8.4 - 95.6 mg/dL    GFR calc non Af Amer 58 (*) >90 mL/min    GFR calc Af Amer 67  (*) >90 mL/min   TROPONIN I     Status: Abnormal   Collection Time   01/13/12 10:10 AM      Component Value Range Comment   Troponin I 0.67 (*) <0.30 ng/mL   APTT     Status: Normal   Collection Time   01/13/12 11:31 AM      Component Value Range Comment   aPTT 31  24 - 37 seconds   PROTIME-INR     Status: Normal   Collection Time   01/13/12 11:31 AM      Component Value Range Comment   Prothrombin Time 14.0  11.6 - 15.2 seconds    INR 1.09  0.00 - 1.49   PROTIME-INR     Status: Normal   Collection Time   01/13/12 12:23 PM      Component Value Range Comment   Prothrombin Time 14.4  11.6 - 15.2 seconds    INR 1.14  0.00 - 1.49   TSH  Status: Normal   Collection Time   01/13/12 12:23 PM      Component Value Range Comment   TSH 1.783  0.350 - 4.500 uIU/mL   MAGNESIUM     Status: Normal   Collection Time   01/13/12 12:23 PM      Component Value Range Comment   Magnesium 2.1  1.5 - 2.5 mg/dL   HEMOGLOBIN Z6X     Status: Abnormal   Collection Time   01/13/12 12:23 PM      Component Value Range Comment   Hemoglobin A1C 6.5 (*) <5.7 %    Mean Plasma Glucose 140 (*) <117 mg/dL   CK TOTAL AND CKMB     Status: Abnormal   Collection Time   01/13/12 12:24 PM      Component Value Range Comment   Total CK 64  7 - 232 U/L    CK, MB 4.7 (*) 0.3 - 4.0 ng/mL    Relative Index RELATIVE INDEX IS INVALID  0.0 - 2.5   TROPONIN I     Status: Abnormal   Collection Time   01/13/12 12:24 PM      Component Value Range Comment   Troponin I 0.70 (*) <0.30 ng/mL   MRSA PCR SCREENING     Status: Normal   Collection Time   01/13/12  3:11 PM      Component Value Range Comment   MRSA by PCR NEGATIVE  NEGATIVE   POCT ACTIVATED CLOTTING TIME     Status: Normal   Collection Time   01/13/12  5:02 PM      Component Value Range Comment   Activated Clotting Time 324     TROPONIN I     Status: Abnormal   Collection Time   01/13/12  6:16 PM      Component Value Range Comment   Troponin I 1.20  (*) <0.30 ng/mL   TROPONIN I     Status: Abnormal   Collection Time   01/13/12 11:02 PM      Component Value Range Comment   Troponin I 2.15 (*) <0.30 ng/mL   CBC     Status: Normal   Collection Time   01/14/12  4:00 AM      Component Value Range Comment   WBC 7.1  4.0 - 10.5 K/uL    RBC 4.75  4.22 - 5.81 MIL/uL    Hemoglobin 14.1  13.0 - 17.0 g/dL    HCT 09.6  04.5 - 40.9 %    MCV 83.6  78.0 - 100.0 fL    MCH 29.7  26.0 - 34.0 pg    MCHC 35.5  30.0 - 36.0 g/dL    RDW 81.1  91.4 - 78.2 %    Platelets 180  150 - 400 K/uL   BASIC METABOLIC PANEL     Status: Abnormal   Collection Time   01/14/12  4:00 AM      Component Value Range Comment   Sodium 136  135 - 145 mEq/L    Potassium 3.5  3.5 - 5.1 mEq/L    Chloride 102  96 - 112 mEq/L    CO2 22  19 - 32 mEq/L    Glucose, Bld 134 (*) 70 - 99 mg/dL    BUN 14  6 - 23 mg/dL    Creatinine, Ser 9.56  0.50 - 1.35 mg/dL    Calcium 9.0  8.4 - 21.3 mg/dL    GFR calc non Af Amer 68 (*) >  90 mL/min    GFR calc Af Amer 79 (*) >90 mL/min   LIPID PANEL     Status: Abnormal   Collection Time   01/14/12  4:00 AM      Component Value Range Comment   Cholesterol 133  0 - 200 mg/dL    Triglycerides 478  <295 mg/dL    HDL 38 (*) >62 mg/dL    Total CHOL/HDL Ratio 3.5      VLDL 21  0 - 40 mg/dL    LDL Cholesterol 74  0 - 99 mg/dL     Imaging: Imaging results have been reviewed  Assessment/Plan:   1. Principal Problem: 2.  *NSTEMI (non-ST elevated myocardial infarction) 3. Active Problems: 4.  Hypercholesteremia 5.  CAD (coronary artery disease), CABG in 1998 and stent to VG-OM with cutting balloon to diag in 2003 6.  HTN (hypertension) 7.   Time Spent Directly with Patient:  20 minutes  Length of Stay:  LOS: 1 day   OK for transfer to tele. Ambulate. ASA and Brilenta. Home tomorrow.  [name] 01/14/2012, 7:39 AM

## 2012-01-15 DIAGNOSIS — I2510 Atherosclerotic heart disease of native coronary artery without angina pectoris: Secondary | ICD-10-CM | POA: Diagnosis not present

## 2012-01-15 DIAGNOSIS — I214 Non-ST elevation (NSTEMI) myocardial infarction: Secondary | ICD-10-CM | POA: Diagnosis not present

## 2012-01-15 MED ORDER — TICAGRELOR 90 MG PO TABS: 90.0000 mg | ORAL_TABLET | Freq: Two times a day (BID) | ORAL | Status: DC

## 2012-01-15 MED ORDER — NITROGLYCERIN 0.4 MG SL SUBL: 0.4000 mg | SUBLINGUAL_TABLET | SUBLINGUAL | Status: DC | PRN

## 2012-01-15 MED ORDER — POTASSIUM CHLORIDE CRYS ER 20 MEQ PO TBCR
EXTENDED_RELEASE_TABLET | ORAL | Status: AC
  Filled 2012-01-15: qty 2

## 2012-01-15 NOTE — ED Provider Notes (Signed)
Medical screening examination/treatment/procedure(s) were conducted as a shared visit with non-physician practitioner(s) and myself.  I personally evaluated the patient during the encounter  Pt seen/examed, he has story c/w unstable angina with elevated troponin and EKG changes Cardiology consulted.  Pt stabilized in the ED  Joya Gaskins, MD 01/15/12 0230

## 2012-01-15 NOTE — Discharge Summary (Signed)
Physician Discharge Summary  Patient ID: Xavier Giles MRN: [id number] DOB/AGE: August 31, 1940 71 y.o.  Admit date: 01/13/2012 Discharge date: 01/15/2012  Admission Diagnoses: NSTEMI  Discharge Diagnoses:  Principal Problem:  *NSTEMI (non-ST elevated myocardial infarction) Active Problems:  Hypercholesteremia  CAD (coronary artery disease), CABG in 1998 and stent to VG-OM with cutting balloon to diag in 2003  HTN (hypertension)   Discharged Condition: stable  Hospital Course:   71 year old white married male presents to the emergency room after 4 day history of episodic chest pain. Initially the pain occurred with activity and would resolve with rest. During the night he had chest discomfort described as a midsternal pressure with radiation across the chest none into his arms or back, mild nausea, no diaphoresis, mild shortness of breath. He did not take nitroglycerin sublingual. In the emergency room he was pain-free.  Troponin was 0.67 and he has EKG changes with inferior lateral ischemia.  IV heparin was started.   Cardiac history  includes bypass grafting in 1998 with vein graft to the diagonal vein graft to the OM vein graft to the PDA and LIMA to the LAD. His last cardiac cath was 2003 after positive stress test he was found to have 100% occluded vein graft to the PDA, LIMA was patent, vein graft to the diagonal had an area of 80% narrowing and underwent cutting balloon angioplasty and the vein graft to the OM was 90% stenotic and underwent PTCA and stent. His last nuclear stress test was 2012 and there was no ischemia, though the EKG was positive with apparent conduction in the inferior leads with ST elevation in aVL and aVR. EF in 2003 by cardiac cath was 45-50%.  The patient was admitted as an inpatient.  IV nitro was started.  He was taken for [facility] which revealed occluded vein graft to the RCA and diagonal branch, patent sequential vein to the obtuse marginal branches with a high-grade  lesion between the 2 anastomoses distally and a patent LIMA the LAD was a moderate lesion just after insertion.  He had successful PCI and stenting of the SVG to the OM with a DES(See full cath report below).  The patient was seen by Dr. Herbie Baltimore who feels he is stable for DC home.  Consults: None  Significant Diagnostic Studies:  Left heart cath with PCI PROCEDURE DESCRIPTION:  The patient was brought to the second floor  [facility] lab in the postabsorptive state. He was  premedicated with Valium 5 mg by mouth.. His right groin  was prepped and shaved in usual sterile fashion. Xylocaine 1% was used  for local anesthesia. A 6 French sheath was inserted into the right common femoral  artery using standard Seldinger technique. The 5 French right and left Judkins diagnostic catheters all the 5 French pigtail catheter were used for selective coronary angiography, left ventriculography, selective vein graft and LIMA angiography. Visipaque dye was used for the entirety of the case. Retrograde aortic, left ventricular, and pullback pressures were recorded.  HEMODYNAMICS:  AO SYSTOLIC/AO DIASTOLIC: 112/59  LV SYSTOLIC/LV DIASTOLIC: 115/12  ANGIOGRAPHIC RESULTS:  1. Left main; 50% tapered distal  2. LAD; the 70-80% ostial/proximal, 90% mid focal after diagonal branch #2 with competitive flow distally. The first diagonal branch was moderate in size and a 50% segmental proximal stenosis. There was a tented segment with what was appeared to be an occluded SVG.  3. Left circumflex; occluded in the mid AV groove after OM1 with bridging collaterals to the  posterolateral branches..  4. Right coronary artery; codominant with a Mahaffy segment of occlusion in the midportion and bridging collaterals  5.LIMA TO LAD; widely patent with a 70% hypodense lesion just after the anastomosis  6. SVG TO RCA was totally occluded at the origin and this was an old finding  SVG TO diagonal branch was occluded at the  origin  SVG TO 12 and 3 was patent with a 90% stenosis in the vein graft between the 2 anastomoses.  7. Left ventriculography; RAO left ventriculogram was performed using  25 mL of Visipaque dye at 12 mL/second. The overall LVEF estimated  60 % Without wall motion abnormalities  IMPRESSION:Xavier Giles has occluded vein graft to the RCA and diagonal branch, patent sequential vein to the obtuse marginal branches with a high-grade lesion between the 2 anastomoses distally and a patent LIMA the LAD was a moderate lesion just after insertion. I believe the culprit vessel this is obtuse marginal branch and vein graft which intervene on. I do not think an FFR of the LAD lesion the the LIMA given competitive flow be physiologically accurate.  Procedure description: the patient received 180 mg of Brilenta loading dose. He had already received aspirin. Using a 6 French left bypass graft guiding catheter along with an 014 Clinical cytogeneticist guiding wire and a 2.0 x 12 mm Keeling balloon predilatation was performed on the distal SVG to obtuse marginal branches 2 and 3. Stenting was then performed with a 2.75 mm x 15 mm Frutiger Xience Xpedition drug-eluting stent at 15 atmospheres post dilatation being performed with a 3.0 mm but 12 mm Zaremba noncompliant balloon at 16 atmospheres (3.1 mm) resulting reduction of a 90% stenosis to 0% residual with excellent tone or dissection.  Final impression: Vessel PCI and stenting of the SVG to obtuse marginal branch with drug-eluting stent, Angiomax, and Brilenta. The RCA and diagonal branch vein grafts are occluded and there is moderate disease in the diagonal branch proximally and in the distal LAD beyond the LIMA insertion for CABG medically. The sheath was removed and the right common femoral arterial puncture site was sealed with the "[facility] " closure device with excellent hemostasis. The patient left the Cath Lab in stable condition on reduced dose Angiomax until the existing bag is completed.[name] MD, [facility]  BMET    Component Value Date/Time   NA 136 01/14/2012 0400   K 3.5 01/14/2012 0400   CL 102 01/14/2012 0400   CO2 22 01/14/2012 0400   GLUCOSE 134* 01/14/2012 0400   BUN 14 01/14/2012 0400   CREATININE 1.05 01/14/2012 0400   CALCIUM 9.0 01/14/2012 0400   GFRNONAA 68* 01/14/2012 0400   GFRAA 79* 01/14/2012 0400   CBC    Component Value Date/Time   WBC 7.1 01/14/2012 0400   RBC 4.75 01/14/2012 0400   HGB 14.1 01/14/2012 0400   HCT 39.7 01/14/2012 0400   PLT 180 01/14/2012 0400   MCV 83.6 01/14/2012 0400   MCH 29.7 01/14/2012 0400   MCHC 35.5 01/14/2012 0400   RDW 13.2 01/14/2012 0400   Treatments:  See above  Discharge Exam: Blood pressure 146/68, pulse 85, temperature 98.4 F (36.9 C), temperature source Oral, resp. rate 17, height 5\' 11"  (1.803 m), weight 81.8 kg (180 lb 5.4 oz), SpO2 98.00%.  Disposition:   Discharge Orders    Future Orders Please Complete By Expires   Diet - low sodium heart healthy      Increase activity slowly  Discharge instructions      Comments:   No lifting more than a half gallon of milk or driving for the next two days.       Medication List     As of 01/15/2012 10:50 AM    TAKE these medications         amLODipine-benazepril 10-20 MG per capsule   Commonly known as: LOTREL   Take 1 capsule by mouth daily.      aspirin EC 81 MG tablet   Take 81 mg by mouth daily.      atenolol 50 MG tablet   Commonly known as: TENORMIN   Take 50 mg by mouth daily.      hydrochlorothiazide 12.5 MG capsule   Commonly known as: MICROZIDE   Take 12.5 mg by mouth daily.      nitroGLYCERIN 0.4 MG SL tablet   Commonly known as: NITROSTAT   Place 1 tablet (0.4 mg total) under the tongue every 5 (five) minutes x 3 doses as needed for chest pain.      pravastatin 40 MG tablet   Commonly known as: PRAVACHOL   Take 40 mg by mouth daily.      Ticagrelor 90 MG Tabs tablet   Commonly known as: BRILINTA   Take 1  tablet (90 mg total) by mouth 2 (two) times daily.           Follow-up Information    Follow up with Sukhdeep Wieting W, MD. (Our office will call you with your appt date and time. )    Contact information:   [address] [place] [address] [phone]          Signed: Wilburt Finlay 01/15/2012, 10:50 AM  I saw and examined the patient on the morning of discharge.   He was doing well s/p PCI in the setting of a NSTEMI.  He has had no further angina or CHF symptoms.  His cath site is stable. BP & HR are also stable.  He is ready for discharge.  I agree with Xavier Giles d/c summary.  Marykay Lex, M.D., M.S. [facility] [address] [place], [place]  [address]  [phone] Pager # [phone] 01/16/2012 1:10 AM

## 2012-01-15 NOTE — Progress Notes (Signed)
CARDIAC REHAB PHASE I   PRE:  Rate/Rhythm: 81SR  BP:  Supine:   Sitting: 120/60  Standing:    SaO2:   MODE:  Ambulation: 790 ft   POST:  Rate/Rhythem: 93  BP:  Supine:   Sitting: 130/70  Standing:    SaO2: 95%RA [id number] Pt walked 790 ft with steady gait. Denied chest pain. Tolerated well. Ex ed completed with pt and wife. Offered CRP 2 again but pt declines.  Xavier Giles

## 2012-01-15 NOTE — Progress Notes (Signed)
[facility]  Subjective: No Complaints  Objective: Vital signs in last 24 hours: Temp:  [97.6 F (36.4 C)-98.6 F (37 C)] 98.4 F (36.9 C) (11/15 0600) Pulse Rate:  [62-81] 62  (11/15 0600) Resp:  [17-18] 17  (11/15 0600) BP: (111-135)/(57-75) 125/72 mmHg (11/15 0600) SpO2:  [95 %-99 %] 98 % (11/15 0600) Last BM Date: 01/15/12  Intake/Output from previous day: 11/14 0701 - 11/15 0700 In: 455 [P.O.:440; I.V.:15] Out: 875 [Urine:875] Intake/Output this shift:    Medications Current Facility-Administered Medications  Medication Dose Route Frequency Provider Last Rate Last Dose  . acetaminophen (TYLENOL) tablet 650 mg  650 mg Oral Q4H PRN [name], MD      . ALPRAZolam Prudy Feeler) tablet 0.25 mg  0.25 mg Oral BID PRN Nada Boozer, NP      . amLODipine (NORVASC) tablet 10 mg  10 mg Oral Daily [name], MD   10 mg at 01/14/12 0956   And  . benazepril (LOTENSIN) tablet 20 mg  20 mg Oral Daily [name], MD   20 mg at 01/14/12 0956  . aspirin chewable tablet 81 mg  81 mg Oral Daily [name], MD   81 mg at 01/14/12 0956  . atenolol (TENORMIN) tablet 50 mg  50 mg Oral Daily Nada Boozer, NP   50 mg at 01/14/12 0956  . hydrochlorothiazide (MICROZIDE) capsule 12.5 mg  12.5 mg Oral Daily Nada Boozer, NP   12.5 mg at 01/14/12 0956  . influenza  inactive virus vaccine (FLUZONE/FLUARIX) injection 0.5 mL  0.5 mL Intramuscular Tomorrow-1000 [name], MD      . morphine 2 MG/ML injection 2 mg  2 mg Intravenous Q1H PRN [name], MD      . nitroGLYCERIN (NITROSTAT) SL tablet 0.4 mg  0.4 mg Sublingual Q5 Min x 3 PRN Nada Boozer, NP      . ondansetron [facility]) injection 4 mg  4 mg Intravenous Q6H PRN [name], MD      . pravastatin (PRAVACHOL) tablet 40 mg  40 mg Oral QHS [name], MD   40 mg at 01/14/12 2116  . Ticagrelor (BRILINTA) tablet 90 mg  90 mg Oral BID Marykay Lex, MD   90 mg at 01/14/12 2116    . zolpidem (AMBIEN) tablet 5 mg  5 mg Oral QHS PRN Nada Boozer, NP      . [DISCONTINUED] 0.9 %  sodium chloride infusion   Intravenous Continuous [name], MD 75 mL/hr at 01/14/12 0153      PE: General appearance: alert, cooperative and no distress Lungs: clear to auscultation bilaterally Heart: regular rate and rhythm, S1, S2 normal, no murmur, click, rub or gallop Extremities: No LEE Pulses: 2+ and symmetric Neurologic: Grossly normal Skin:  Right groin-small hematoma.  No ecchymosis or tenderness.  Lab Results:   Basename 01/14/12 0400 01/13/12 1010  WBC 7.1 7.5  HGB 14.1 14.8  HCT 39.7 42.6  PLT 180 197   BMET  Basename 01/14/12 0400 01/13/12 1010  NA 136 140  K 3.5 4.2  CL 102 103  CO2 22 27  GLUCOSE 134* 178*  BUN 14 17  CREATININE 1.05 1.20  CALCIUM 9.0 9.6   PT/INR  Basename 01/13/12 1223 01/13/12 1131  LABPROT 14.4 14.0  INR 1.14 1.09   Lipid Panel     Component Value Date/Time   CHOL 133 01/14/2012 0400   TRIG 104 01/14/2012  0400   HDL 38* 01/14/2012 0400   CHOLHDL 3.5 01/14/2012 0400   VLDL 21 01/14/2012 0400   LDLCALC 74 01/14/2012 0400    Assessment/Plan  Principal Problem:  *NSTEMI (non-ST elevated myocardial infarction) Active Problems:  Hypercholesteremia  CAD (coronary artery disease), CABG in 1998 and stent to VG-OM with cutting balloon to diag in 2003  HTN (hypertension)  Plan:  S/P LCX-OM PCI/Stent using DES.   Doing well.  No CP.   BP and HR controlled.   ASA, benazepril, Atenolol, statin, brilinta, HCTZ.  DC home today.    LOS: 2 days    HAGER, BRYAN 01/15/2012 7:54 AM  I seen and evaluated the patient this morning along with Wilburt Finlay, PA. I agree with their findings, examination as well as impression recommendations.  Looks good s/p PCI of LC-OM for NSTEMI.  BP & HR well controlled & on DAPT.  Labs look good & cath site looks good.  I agree with plan to d/c home today.  Marykay Lex, M.D., M.S. [facility] [address] [place], [place]  [address]  [phone] Pager # [phone] 01/15/2012 9:27 AM

## 2012-01-15 NOTE — Progress Notes (Signed)
Patient given flu vaccine prior to discharge and discharge instructions given to patient and family questions answered will discharge home with wife. Jaretssi Kraker, Randall An RN

## 2012-01-26 DIAGNOSIS — E782 Mixed hyperlipidemia: Secondary | ICD-10-CM | POA: Diagnosis not present

## 2012-01-26 DIAGNOSIS — I2510 Atherosclerotic heart disease of native coronary artery without angina pectoris: Secondary | ICD-10-CM | POA: Diagnosis not present

## 2012-01-26 DIAGNOSIS — I10 Essential (primary) hypertension: Secondary | ICD-10-CM | POA: Diagnosis not present

## 2012-03-28 DIAGNOSIS — E782 Mixed hyperlipidemia: Secondary | ICD-10-CM | POA: Diagnosis not present

## 2012-03-28 DIAGNOSIS — I10 Essential (primary) hypertension: Secondary | ICD-10-CM | POA: Diagnosis not present

## 2012-03-28 DIAGNOSIS — Z9861 Coronary angioplasty status: Secondary | ICD-10-CM | POA: Diagnosis not present

## 2012-03-28 DIAGNOSIS — I2510 Atherosclerotic heart disease of native coronary artery without angina pectoris: Secondary | ICD-10-CM | POA: Diagnosis not present

## 2012-04-01 ENCOUNTER — Ambulatory Visit (HOSPITAL_COMMUNITY)
Admission: RE | Admit: 2012-04-01 | Discharge: 2012-04-01 | Disposition: A | Discharge status: Home / Self Care | Payer: Medicare Other | Source: Ambulatory Visit | Attending: Cardiology | Admitting: Cardiology

## 2012-04-01 DIAGNOSIS — Z9861 Coronary angioplasty status: Secondary | ICD-10-CM | POA: Diagnosis not present

## 2012-04-01 DIAGNOSIS — I10 Essential (primary) hypertension: Secondary | ICD-10-CM | POA: Insufficient documentation

## 2012-04-01 DIAGNOSIS — I25810 Atherosclerosis of coronary artery bypass graft(s) without angina pectoris: Secondary | ICD-10-CM | POA: Insufficient documentation

## 2012-04-01 DIAGNOSIS — E7800 Pure hypercholesterolemia, unspecified: Secondary | ICD-10-CM | POA: Diagnosis not present

## 2012-04-01 DIAGNOSIS — I214 Non-ST elevation (NSTEMI) myocardial infarction: Secondary | ICD-10-CM | POA: Diagnosis not present

## 2012-08-23 ENCOUNTER — Other Ambulatory Visit: Payer: Self-pay | Admitting: *Deleted

## 2012-08-23 MED ORDER — HYDROCHLOROTHIAZIDE 12.5 MG PO CAPS: 12.5000 mg | ORAL_CAPSULE | Freq: Every day | ORAL | Status: DC

## 2012-08-23 NOTE — Telephone Encounter (Signed)
HCTZ refilled electronically

## 2012-08-29 ENCOUNTER — Other Ambulatory Visit: Payer: Self-pay | Admitting: *Deleted

## 2012-08-29 MED ORDER — HYDROCHLOROTHIAZIDE 12.5 MG PO CAPS: 12.5000 mg | ORAL_CAPSULE | Freq: Every day | ORAL | Status: DC

## 2012-08-29 NOTE — Telephone Encounter (Signed)
HCTZ refilled w/6 refills

## 2012-10-03 ENCOUNTER — Encounter: Payer: Self-pay | Admitting: Cardiology

## 2012-10-03 ENCOUNTER — Encounter: Payer: Self-pay | Admitting: *Deleted

## 2012-10-04 ENCOUNTER — Ambulatory Visit: Payer: PRIVATE HEALTH INSURANCE | Admitting: Cardiology

## 2012-10-07 ENCOUNTER — Other Ambulatory Visit: Payer: Self-pay | Admitting: *Deleted

## 2012-10-07 MED ORDER — AMLODIPINE BESY-BENAZEPRIL HCL 10-20 MG PO CAPS: 1.0000 | ORAL_CAPSULE | Freq: Every day | ORAL | Status: DC

## 2012-10-07 NOTE — Telephone Encounter (Signed)
Rx was sent to pharmacy electronically. 

## 2012-10-18 ENCOUNTER — Encounter: Payer: Self-pay | Admitting: Cardiology

## 2012-10-18 ENCOUNTER — Ambulatory Visit (INDEPENDENT_AMBULATORY_CARE_PROVIDER_SITE_OTHER): Payer: Medicare Other | Admitting: Cardiology

## 2012-10-18 VITALS — BP 140/50 | HR 62 | Ht 71.0 in | Wt 180.6 lb

## 2012-10-18 DIAGNOSIS — I10 Essential (primary) hypertension: Secondary | ICD-10-CM

## 2012-10-18 DIAGNOSIS — Z79899 Other long term (current) drug therapy: Secondary | ICD-10-CM | POA: Diagnosis not present

## 2012-10-18 DIAGNOSIS — I214 Non-ST elevation (NSTEMI) myocardial infarction: Secondary | ICD-10-CM

## 2012-10-18 DIAGNOSIS — Z951 Presence of aortocoronary bypass graft: Secondary | ICD-10-CM

## 2012-10-18 DIAGNOSIS — I25810 Atherosclerosis of coronary artery bypass graft(s) without angina pectoris: Secondary | ICD-10-CM

## 2012-10-18 DIAGNOSIS — Z9861 Coronary angioplasty status: Secondary | ICD-10-CM

## 2012-10-18 DIAGNOSIS — I951 Orthostatic hypotension: Secondary | ICD-10-CM

## 2012-10-18 DIAGNOSIS — E782 Mixed hyperlipidemia: Secondary | ICD-10-CM | POA: Diagnosis not present

## 2012-10-18 DIAGNOSIS — E785 Hyperlipidemia, unspecified: Secondary | ICD-10-CM

## 2012-10-18 DIAGNOSIS — I2510 Atherosclerotic heart disease of native coronary artery without angina pectoris: Secondary | ICD-10-CM

## 2012-10-18 DIAGNOSIS — Z955 Presence of coronary angioplasty implant and graft: Secondary | ICD-10-CM

## 2012-10-18 NOTE — Patient Instructions (Addendum)
Things see pretty stable. Your blood pressure is OK, but with your occasional dizziness, I would like you to take your Atenolol at night instead of all together with the Lotrel & HCTZ.  I need you to stay on the Plavix continuously until at [facility] November -- after that, we can stop it for procedures.  Since you still have other areas of concern, I would like to keep you on it otherwise.  We are going to order some blood work to check your cholesterol levels.    I will see you back in 6 months.

## 2012-10-24 DIAGNOSIS — Z79899 Other long term (current) drug therapy: Secondary | ICD-10-CM | POA: Diagnosis not present

## 2012-10-24 DIAGNOSIS — E782 Mixed hyperlipidemia: Secondary | ICD-10-CM | POA: Diagnosis not present

## 2012-10-24 LAB — LIPID PANEL
Cholesterol: 130 mg/dL (ref 0–200)
HDL: 38 mg/dL — ABNORMAL LOW (ref >39)
Triglycerides: 86 mg/dL (ref <150)

## 2012-10-24 LAB — COMPREHENSIVE METABOLIC PANEL
Albumin: 5 g/dL (ref 3.5–5.2)
BUN: 16 mg/dL (ref 6–23)
CO2: 29 mEq/L (ref 19–32)
Calcium: 9.6 mg/dL (ref 8.4–10.5)
Chloride: 104 mEq/L (ref 96–112)
Glucose, Bld: 131 mg/dL — ABNORMAL HIGH (ref 70–99)
Potassium: 4.6 mEq/L (ref 3.5–5.3)

## 2012-10-26 ENCOUNTER — Telehealth: Payer: Self-pay | Admitting: *Deleted

## 2012-10-26 NOTE — Telephone Encounter (Signed)
Spoke to wife results were good. If the actual # are needed. He can call back.  She verbalized understanding.

## 2012-10-28 ENCOUNTER — Encounter: Payer: Self-pay | Admitting: Cardiology

## 2012-10-28 DIAGNOSIS — Z515 Encounter for palliative care: Secondary | ICD-10-CM | POA: Insufficient documentation

## 2012-10-28 DIAGNOSIS — E785 Hyperlipidemia, unspecified: Secondary | ICD-10-CM | POA: Insufficient documentation

## 2012-10-28 DIAGNOSIS — I25709 Atherosclerosis of coronary artery bypass graft(s), unspecified, with unspecified angina pectoris: Secondary | ICD-10-CM | POA: Insufficient documentation

## 2012-10-28 DIAGNOSIS — I951 Orthostatic hypotension: Secondary | ICD-10-CM | POA: Insufficient documentation

## 2012-10-28 DIAGNOSIS — Z951 Presence of aortocoronary bypass graft: Secondary | ICD-10-CM | POA: Insufficient documentation

## 2012-10-28 DIAGNOSIS — Z79899 Other long term (current) drug therapy: Secondary | ICD-10-CM | POA: Insufficient documentation

## 2012-10-28 DIAGNOSIS — Z955 Presence of coronary angioplasty implant and graft: Secondary | ICD-10-CM | POA: Insufficient documentation

## 2012-10-28 NOTE — Assessment & Plan Note (Signed)
Borderline controlled.  However with having with orthostatic symptoms, mild PERMISSIVE hypertension is warranted.

## 2012-10-28 NOTE — Assessment & Plan Note (Signed)
He has a Xience DES in the vein graft sequential limb.  His best served on dual antiplatelet therapy for least a year.  At this time it can be stopped for procedure.  I would like to keep him on dual antiplatelet therapy unless he has a bleeding complication.  Simply because of the 8 extent of his disease.

## 2012-10-28 NOTE — Assessment & Plan Note (Addendum)
Quite stable on aspirin Plavix as well as beta blocker and statin.  He is also on calcitriol blocker for blood pressure and antianginal effect.  Plan: Continue current regimen, simply make dose timing adjustments as noted below.

## 2012-10-28 NOTE — Progress Notes (Signed)
Patient ID: Xavier Giles, male   DOB: [birth year], 72 y.o.   MRN: [id number] PCP: Carollee Herter, MD  Clinic Note: Chief Complaint  Patient presents with  . ROV 8 months    Lightheadedness when he stands up.   HPI: Xavier Giles is a 72 y.o. male with a PMH below who presents today for followup of his coronary disease. He is a Morning-term patient of Dr. Caprice Kluver although a 72 years old he appears to be in his 75s. His very healthy-appearing but with a [place]-standing coronary history of CABG and multiple interventions noted below.  We recently converted from Brilinta to Plavix. He, 12 assay checked which was suggestive of adequate platelet inhibition  Interval History: He presents today relatively  well but his symptoms. He denies any chest pain or shortness of breath at rest exertion unless he really pushes it. He does about a mile walking a day and with that has no symptoms. If he does any more strenuous or heavy lifting he'll not be breathing hard but not as early chest discomfort. He notes occasional orthostatic dizziness and occasional palpitations but otherwise no TIA or amaurosis fugax in this. No syncope or near-syncope. No melena, hematochezia or hematuria. No claudication symptoms.  Past Medical History  Diagnosis Date  . CAD in native artery 1998    Multivessel disease referred for CABG  . S/P CABG x 3 2003    LIMA-LAD, SVG-OM2-OM 3, SVG to PDA, SVG-D1  . CAD (coronary artery disease) of bypass graft 2003    Positive stress test: 100% occlusion SVG-PDA, 80% in D1 through SVG, 80% SVG-OM, patent LIMA-LAD; PCI SVG-OM (3.0 x 8 Zeta stent - going into native OM) and native D1 - PTCA only  . History of: Non-STEMI (non-ST elevated myocardial infarction) 01/13/2012    SVG-diagonal occluded, 50% distal left main, 70-80% proximal LAD and 90% mid.  Patent LIMA with 70% lesion post anastomosis.  SVG to RCA occluded, SVG-OM2-3 90% sequential limb stenosis -- PCI SVG-OM 2 -OM 3: Xience  Xpedition DES 2.75 MM x15 MM (3 MM)  . Presence of drug coated stent in left circumflex coronary artery     Xience expedition DES and SVG-OM 2 and OM 3.;  Previously placed BMS into first anastomosis.  [name] HTN (hypertension) 01/13/2012  . History of nuclear stress test 01/2011    infarct v. scar with with fixed inferoseptal defecrt  . Carotid bruit     asymptomatic; carotid doppler 01/2012 - R bulb/prox ICA mild-mod amt of fibrous plaque, L subclavian 70-99% diameter reduction, L bulb/ICA mild amt of fibrous plaque  . Hypertension   . Dyslipidemia, goal LDL below 70    Prior Cardiac Evaluation and Past Surgical History: Past Surgical History  Procedure Laterality Date  . Coronary artery bypass graft  1998    SVG-diagonal, DVG-OM, DVG-PDA, LIMA-LAD  . Coronary angioplasty  2003    BMS 3.0 mm x 8 mm stent to SVG to OM & cuttin balloon to diagonal; 100% occluded vein graft to PDA, patent LIMA; 80% stenosis in SVG-diagnal; vein graft stenosis in OM  . Coronary angioplasty with stent placement  01/2012    PCI & stenting of SVG-OM2 to OM3 segment with Xience Xpedition 2.62mmx15mm DES   Allergies  Allergen Reactions  . Keflex [Cephalexin]   . Lipitor [Atorvastatin]   . Sulfa Antibiotics Nausea Only  . Zocor [Simvastatin]     Current Outpatient Prescriptions  Medication Sig Dispense Refill  . amLODipine-benazepril (LOTREL)  10-20 MG per capsule Take 1 capsule by mouth daily.  90 capsule  2  . aspirin EC 81 MG tablet Take 81 mg by mouth daily.      [name] atenolol (TENORMIN) 50 MG tablet Take 50 mg by mouth daily.      . clopidogrel (PLAVIX) 75 MG tablet Take 75 mg by mouth daily.      . hydrochlorothiazide (MICROZIDE) 12.5 MG capsule Take 1 capsule (12.5 mg total) by mouth daily.  30 capsule  6  . nitroGLYCERIN (NITROSTAT) 0.4 MG SL tablet Place 1 tablet (0.4 mg total) under the tongue every 5 (five) minutes x 3 doses as needed for chest pain.  25 tablet  5  . pravastatin (PRAVACHOL) 40 MG  tablet Take 40 mg by mouth daily.       No current facility-administered medications for this visit.    History   Social History Narrative   Retired Married father of 2, grandfather tube.  Exercises routinely walking a roughly an hour a day.  Does not smoke does not drink.  He quit smoking many years ago.    ROS: A comprehensive Review of Systems - Negative except Pertinence and as noted above. Otherwise mild musculoskeletal pains.  PHYSICAL EXAM BP 140/50  Pulse 62  Ht 5\' 11"  (1.803 m)  Wt 180 lb 9.6 oz (81.92 kg)  BMI 25.2 kg/m2 General: He is a pleasant, healthy-appearing gentleman. He looks younger than his stated age. He is in no acute distress, alert and oriented x3, answers questions appropriately.  HEENT: NCAT, EOMI, MMM, anicteric sclerae.  Neck: Supple; no LAN, JVD, with soft carotid bruits bilaterally, more notable on the right.  Heart: RRR, normal S1, S2. No M/R/G.  Lungs: CTAB, nonlabored, normal effort, good air movement.  Abdomen: Soft/NT/ND/NABS. No HSM. Extremities: No C/C/E, 2+ equal pulses throughout. Strength 5/5 throughout.   ZOX:[id number] today: Yes Rate: 62 , Rhythm: Sinus rhythm with PVC, cannot rule out anteroseptal MI age undetermined.  No significant change  Recent Labs: None at time of visit  ASSESSMENT / PLAN: CAD (coronary artery disease), CABG in 1998 and stent to VG-OM with cutting balloon to diag in 2003 Quite stable on aspirin Plavix as well as beta blocker and statin.  He is also on calcitriol blocker for blood pressure and antianginal effect.  Plan: Continue current regimen, simply make dose timing adjustments as noted below.  Presence of drug coated stent in left circumflex coronary artery He has a Xience DES in the vein graft sequential limb.  His best served on dual antiplatelet therapy for least a year.  At this time it can be stopped for procedure.  I would like to keep him on dual antiplatelet therapy unless he has a bleeding  complication.  Simply because of the 8 extent of his disease.  Dyslipidemia, goal LDL below 70 On pravastatin.  He is due for surveillance labs including lipid panel and CMP.  Orthostatic hypotension Problems he takes all his medications at one time.  I recommended that he take the atenolol in the evening.  He'll take the amlodipine plus HCTZ in the morning.  HTN (hypertension) Borderline controlled.  However with having with orthostatic symptoms, mild PERMISSIVE hypertension is warranted.   Orders Placed This Encounter  Procedures  . Comprehensive metabolic panel    Order Specific Question:  Has the patient fasted?    Answer:  Yes  . Lipid panel    Order Specific Question:  Has the patient fasted?  Answer:  Yes  . EKG 12-Lead   No orders of the defined types were placed in this encounter.    Followup: 6 months  DAVID W. Herbie Baltimore, M.D., M.S. [facility] [address] [place], [place]  [address]  [phone] Pager # [phone]

## 2012-10-28 NOTE — Assessment & Plan Note (Addendum)
Problems he takes all his medications at one time.  I recommended that he take the atenolol in the evening.  He'll take the amlodipine plus HCTZ in the morning.

## 2012-10-28 NOTE — Assessment & Plan Note (Signed)
On pravastatin.  He is due for surveillance labs including lipid panel and CMP.

## 2012-11-21 DIAGNOSIS — I8310 Varicose veins of unspecified lower extremity with inflammation: Secondary | ICD-10-CM | POA: Diagnosis not present

## 2012-12-09 ENCOUNTER — Other Ambulatory Visit: Payer: Self-pay | Admitting: *Deleted

## 2012-12-09 MED ORDER — PRAVASTATIN SODIUM 40 MG PO TABS: 40.0000 mg | ORAL_TABLET | Freq: Every day | ORAL | Status: DC

## 2012-12-09 NOTE — Telephone Encounter (Signed)
Wife aware.e-prescribed to the pharmacy

## 2013-01-03 DIAGNOSIS — Z23 Encounter for immunization: Secondary | ICD-10-CM | POA: Diagnosis not present

## 2013-01-05 ENCOUNTER — Encounter: Payer: Self-pay | Admitting: Internal Medicine

## 2013-02-17 ENCOUNTER — Other Ambulatory Visit: Payer: Self-pay | Admitting: Cardiology

## 2013-02-17 NOTE — Telephone Encounter (Signed)
Rx was sent to pharmacy electronically. 

## 2013-04-05 ENCOUNTER — Other Ambulatory Visit: Payer: Self-pay | Admitting: Cardiology

## 2013-04-05 NOTE — Telephone Encounter (Signed)
Rx was sent to pharmacy electronically. 

## 2013-04-26 ENCOUNTER — Other Ambulatory Visit: Payer: Self-pay | Admitting: Cardiology

## 2013-04-26 NOTE — Telephone Encounter (Signed)
Rx was sent to pharmacy electronically. 

## 2013-06-05 ENCOUNTER — Encounter: Payer: Self-pay | Admitting: Cardiology

## 2013-06-05 ENCOUNTER — Ambulatory Visit (INDEPENDENT_AMBULATORY_CARE_PROVIDER_SITE_OTHER): Payer: Medicare Other | Admitting: Cardiology

## 2013-06-05 VITALS — BP 118/60 | HR 62 | Ht 71.0 in | Wt 181.9 lb

## 2013-06-05 DIAGNOSIS — E785 Hyperlipidemia, unspecified: Secondary | ICD-10-CM

## 2013-06-05 DIAGNOSIS — I25810 Atherosclerosis of coronary artery bypass graft(s) without angina pectoris: Secondary | ICD-10-CM | POA: Diagnosis not present

## 2013-06-05 DIAGNOSIS — I214 Non-ST elevation (NSTEMI) myocardial infarction: Secondary | ICD-10-CM | POA: Diagnosis not present

## 2013-06-05 DIAGNOSIS — Z955 Presence of coronary angioplasty implant and graft: Secondary | ICD-10-CM

## 2013-06-05 DIAGNOSIS — I10 Essential (primary) hypertension: Secondary | ICD-10-CM

## 2013-06-05 DIAGNOSIS — Z951 Presence of aortocoronary bypass graft: Secondary | ICD-10-CM | POA: Diagnosis not present

## 2013-06-05 DIAGNOSIS — Z79899 Other long term (current) drug therapy: Secondary | ICD-10-CM

## 2013-06-05 DIAGNOSIS — I2510 Atherosclerotic heart disease of native coronary artery without angina pectoris: Secondary | ICD-10-CM | POA: Diagnosis not present

## 2013-06-05 DIAGNOSIS — Z9861 Coronary angioplasty status: Secondary | ICD-10-CM

## 2013-06-05 NOTE — Assessment & Plan Note (Signed)
Well-controlled. Would not be more aggressive due to history of orthostatic symptoms in the past.

## 2013-06-05 NOTE — Assessment & Plan Note (Addendum)
Lab were not checked for this visit based on last labs. Will recheck in August -- NMR panel. Continue pravastatin.

## 2013-06-05 NOTE — Assessment & Plan Note (Signed)
Very stable. Can likely stop aspirin and continue Plavix. On beta blocker on combination ACE inhibitor/CCB. Also on statin.  Plan: Continue current regimen. As he will be nearly 2 years status post most recent PCI for graft disease, will check Treadmill Myoview stress prior to one year followup

## 2013-06-05 NOTE — Progress Notes (Signed)
Patient ID: Xavier Giles, male   DOB: 1940-10-03, 73 y.o.   MRN: [id number] PCP: Wyatt Haste, MD  Clinic Note: Chief Complaint  Patient presents with  . 6 month visit    no chest pain ,little sob , no edema last set of labs 8/ 2014   HPI: Xavier Giles is a 73 y.o. male with a PMH below who presents today for followup of his coronary disease. He is a former [facility]-term patient of Dr. Aldona Bar who looks to be in his 61s despite bieng 73 years old. His very healthy-appearing but with a [place]-standing coronary history of CABG and multiple interventions noted below.    Interval History: He presents today with no significant cardiac symptoms. He denies any chest pain or shortness of breath at rest exertion unless he really pushes it. He does about a mile to 2 miles walking a day and with that has no symptoms. If he does any more strenuous or heavy lifting he may start breathing hard, but not as early chest discomfort. He notes occasional orthostatic dizziness and occasional palpitations but otherwise no TIA or amaurosis fugax in this. No syncope or near-syncope. No melena, hematochezia, hematuria or epistaxis. No claudication symptoms. No PND, orthopnea, with mild/stable lower extremity edema.  Past Medical History  Diagnosis Date  . CAD in native artery 1998    Multivessel disease referred for CABG  . S/P CABG x 3 1990    LIMA-LAD, SVG-OM2-OM 3, SVG to PDA, SVG-D1  . CAD (coronary artery disease) of bypass graft 2003    Positive stress test: 100% occlusion SVG-PDA, 80% in D1 through SVG, 80% SVG-OM, patent LIMA-LAD; PCI SVG-OM (3.0 x 8 Zeta stent - going into native OM) and native D1 - PTCA only  . History of: Non-STEMI (non-ST elevated myocardial infarction) 01/13/2012    SVG-diagonal occluded, 50% distal left main, 70-80% proximal LAD and 90% mid.  Patent LIMA with 70% lesion post anastomosis.  SVG to RCA occluded, SVG-OM2-3 90% sequential limb stenosis -- PCI SVG-OM 2 -OM 3: Xience  Xpedition DES 2.75 MM x15 MM (3 MM)  . Presence of drug coated stent in left circumflex coronary artery     Xience expedition DES -- SVG-OM 2 -OM 3.;  Previously placed BMS into first anastomosis.  [name] HTN (hypertension) 01/13/2012  . History of nuclear stress test 01/2011    infarct v. scar with with fixed inferoseptal defecrt  . Carotid bruit     asymptomatic; carotid doppler 01/2012 - R bulb/prox ICA mild-mod amt of fibrous plaque, L subclavian 70-99% diameter reduction, L bulb/ICA mild amt of fibrous plaque  . Hypertension   . Dyslipidemia, goal LDL below 70    Prior Cardiac Evaluation and Past Surgical History: Past Surgical History  Procedure Laterality Date  . Coronary artery bypass graft  1998    SVG-diagonal, DVG-OM, DVG-PDA, LIMA-LAD  . Coronary angioplasty  2003    BMS 3.0 mm x 8 mm stent to SVG to OM & cuttin balloon to diagonal; 100% occluded vein graft to PDA, patent LIMA; 80% stenosis in SVG-diagnal; vein graft stenosis in OM  . Coronary angioplasty with stent placement  01/2012    PCI & stenting of SVG-OM2 to OM3 segment with Xience Xpedition 2.56mmx15mm DES   Allergies  Allergen Reactions  . Keflex [Cephalexin]   . Lipitor [Atorvastatin]   . Sulfa Antibiotics Nausea Only  . Zocor [Simvastatin]     Current Outpatient Prescriptions  Medication Sig Dispense Refill  .  amLODipine-benazepril (LOTREL) 10-20 MG per capsule Take 1 capsule by mouth daily.  90 capsule  2  . aspirin EC 81 MG tablet Take 81 mg by mouth daily.      [name] atenolol (TENORMIN) 50 MG tablet TAKE ONE TABLET BY MOUTH EVERY DAY  90 tablet  1  . clopidogrel (PLAVIX) 75 MG tablet TAKE ONE TABLET BY MOUTH EVERY DAY  30 tablet  8  . hydrochlorothiazide (MICROZIDE) 12.5 MG capsule TAKE ONE CAPSULE BY MOUTH ONCE DAILY  30 capsule  6  . nitroGLYCERIN (NITROSTAT) 0.4 MG SL tablet Place 1 tablet (0.4 mg total) under the tongue every 5 (five) minutes x 3 doses as needed for chest pain.  25 tablet  5  . pravastatin  (PRAVACHOL) 40 MG tablet Take 1 tablet (40 mg total) by mouth daily.  30 tablet  11   No current facility-administered medications for this visit.    History   Social History Narrative   Retired Married father of 2, grandfather tube.  Exercises routinely walking a roughly an hour a day.  Does not smoke does not drink.  He quit smoking many years ago.   ROS: A comprehensive Review of Systems - Negative except pertinent symptoms and as noted above. Otherwise mild musculoskeletal pains.  occasional sharp/20 mean discomfort in his chest regardless of activity. Mild lower extremity edema that comes and goes.  PHYSICAL EXAM BP 118/60  Pulse 62  Ht 5\' 11"  (1.803 m)  Wt 181 lb 14.4 oz (82.509 kg)  BMI 25.38 kg/m2 General: He is a pleasant, healthy-appearing gentleman. He looks younger than his stated age. He is in no acute distress, alert and oriented x3, answers questions appropriately.  HEENT: NCAT, EOMI, MMM, anicteric sclerae.  Neck: Supple; no LAN, JVD, with soft carotid bruits bilaterally, more notable on the right.  Heart: RRR, normal S1, S2. No M/R/G.  Lungs: CTAB, nonlabored, normal effort, good air movement.  Abdomen: Soft/NT/ND/NABS. No HSM. Extremities: No C/C/E, 2+ equal pulses throughout. Strength 5/5 throughout.   WUJ:[id number] today: Yes Rate: 62 , Rhythm: NSR; cannot rule out anteroseptal MI age undetermined - with forward progression.  No significant change  Recent Labs: Last lipids 10/24/12: T. C-130, TG 86, HDL 38, LDL 75 - stable  ASSESSMENT / PLAN: CAD (coronary artery disease), CABG in 1998 and stent to VG-OM with cutting balloon to diag in 2003 Very stable. Can likely stop aspirin and continue Plavix. On beta blocker on combination ACE inhibitor/CCB. Also on statin.  Plan: Continue current regimen. As he will be nearly 2 years status post most recent PCI for graft disease, will check Treadmill Myoview stress prior to one year followup  History of: NSTEMI (non-ST  elevated myocardial infarction) No active anginal symptoms. Prior nuclear stress test showed a fixed inferoseptal defect. Will reassess prior to followup.  HTN (hypertension) Well-controlled. Would not be more aggressive due to history of orthostatic symptoms in the past.  Presence of drug coated stent in left circumflex coronary artery Doing well with Plavix. Now almost 1 1/2 years post PCI, can DC aspirin  Dyslipidemia, goal LDL below 70 Lab were not checked for this visit based on last labs. Will recheck in August -- NMR panel. Continue pravastatin.   Orders Placed This Encounter  Procedures  . NMR Lipoprofile with Lipids    Standing Status: Future     Number of Occurrences:      Standing Expiration Date: 06/06/2014  . Comprehensive metabolic panel    Standing Status:  Future     Number of Occurrences:      Standing Expiration Date: 06/06/2014    Order Specific Question:  Has the patient fasted?    Answer:  Yes  . Myocardial Perfusion Imaging    Standing Status: Future     Number of Occurrences:      Standing Expiration Date: 06/05/2015    Scheduling Instructions:     Schedule in April 2016 follow up appointment.    Order Specific Question:  Where should this test be performed    Answer:  [facility]    Order Specific Question:  Type of stress    Answer:  Exercise    Order Specific Question:  Patient weight in lbs    Answer:  181  . EKG 12-Lead   No orders of the defined types were placed in this encounter.    Followup: 12 months  DAVID W. Ellyn Hack, M.D., M.S. [facility] [address] [place], [place]  [address]  [phone] Pager # [phone]

## 2013-06-05 NOTE — Assessment & Plan Note (Signed)
Doing well with Plavix. Now almost 1 1/2 years post PCI, can DC aspirin

## 2013-06-05 NOTE — Patient Instructions (Signed)
Your physician has requested that you have en exercise stress myoview. For further information please visit [email or web address]. Please follow instruction sheet, as given. Schedule April 2016   LABS  NMR with LIPIDS, CMP Aug 2015  Your physician wants you to follow-up in APRIL  2016 DR Churchill.  You will receive a reminder letter in the mail two months in advance. If you don't receive a letter, please call our office to schedule the follow-up appointment.

## 2013-06-05 NOTE — Assessment & Plan Note (Signed)
No active anginal symptoms. Prior nuclear stress test showed a fixed inferoseptal defect. Will reassess prior to followup.

## 2013-06-28 ENCOUNTER — Encounter (HOSPITAL_COMMUNITY)
Admission: EM | Disposition: A | Discharge status: Home / Self Care | Payer: Medicare Other | Source: Home / Self Care | Attending: Cardiology

## 2013-06-28 ENCOUNTER — Emergency Department (HOSPITAL_COMMUNITY): Payer: Medicare Other

## 2013-06-28 ENCOUNTER — Inpatient Hospital Stay (HOSPITAL_COMMUNITY)
Admission: EM | Admit: 2013-06-28 | Discharge: 2013-06-30 | DRG: 247 | Disposition: A | Discharge status: Home / Self Care | Payer: Medicare Other | Attending: Cardiology | Admitting: Cardiology

## 2013-06-28 ENCOUNTER — Encounter (HOSPITAL_COMMUNITY): Payer: Self-pay | Admitting: Emergency Medicine

## 2013-06-28 DIAGNOSIS — Z79899 Other long term (current) drug therapy: Secondary | ICD-10-CM

## 2013-06-28 DIAGNOSIS — Z951 Presence of aortocoronary bypass graft: Secondary | ICD-10-CM

## 2013-06-28 DIAGNOSIS — E782 Mixed hyperlipidemia: Secondary | ICD-10-CM | POA: Diagnosis present

## 2013-06-28 DIAGNOSIS — K920 Hematemesis: Secondary | ICD-10-CM

## 2013-06-28 DIAGNOSIS — I200 Unstable angina: Secondary | ICD-10-CM | POA: Diagnosis present

## 2013-06-28 DIAGNOSIS — Z9861 Coronary angioplasty status: Secondary | ICD-10-CM | POA: Diagnosis not present

## 2013-06-28 DIAGNOSIS — I10 Essential (primary) hypertension: Secondary | ICD-10-CM | POA: Diagnosis present

## 2013-06-28 DIAGNOSIS — I2582 Chronic total occlusion of coronary artery: Secondary | ICD-10-CM | POA: Diagnosis not present

## 2013-06-28 DIAGNOSIS — E785 Hyperlipidemia, unspecified: Secondary | ICD-10-CM | POA: Diagnosis present

## 2013-06-28 DIAGNOSIS — I252 Old myocardial infarction: Secondary | ICD-10-CM | POA: Diagnosis not present

## 2013-06-28 DIAGNOSIS — J984 Other disorders of lung: Secondary | ICD-10-CM | POA: Diagnosis not present

## 2013-06-28 DIAGNOSIS — Y831 Surgical operation with implant of artificial internal device as the cause of abnormal reaction of the patient, or of later complication, without mention of misadventure at the time of the procedure: Secondary | ICD-10-CM | POA: Diagnosis present

## 2013-06-28 DIAGNOSIS — Z7982 Long term (current) use of aspirin: Secondary | ICD-10-CM | POA: Diagnosis not present

## 2013-06-28 DIAGNOSIS — R079 Chest pain, unspecified: Secondary | ICD-10-CM | POA: Diagnosis not present

## 2013-06-28 DIAGNOSIS — K208 Other esophagitis without bleeding: Secondary | ICD-10-CM | POA: Diagnosis not present

## 2013-06-28 DIAGNOSIS — Z7902 Long term (current) use of antithrombotics/antiplatelets: Secondary | ICD-10-CM

## 2013-06-28 DIAGNOSIS — I25810 Atherosclerosis of coronary artery bypass graft(s) without angina pectoris: Secondary | ICD-10-CM

## 2013-06-28 DIAGNOSIS — I2510 Atherosclerotic heart disease of native coronary artery without angina pectoris: Secondary | ICD-10-CM | POA: Diagnosis present

## 2013-06-28 DIAGNOSIS — Z48812 Encounter for surgical aftercare following surgery on the circulatory system: Secondary | ICD-10-CM | POA: Diagnosis not present

## 2013-06-28 DIAGNOSIS — T82897A Other specified complication of cardiac prosthetic devices, implants and grafts, initial encounter: Secondary | ICD-10-CM | POA: Diagnosis not present

## 2013-06-28 DIAGNOSIS — K449 Diaphragmatic hernia without obstruction or gangrene: Secondary | ICD-10-CM | POA: Diagnosis present

## 2013-06-28 DIAGNOSIS — K209 Esophagitis, unspecified without bleeding: Secondary | ICD-10-CM | POA: Diagnosis present

## 2013-06-28 HISTORY — PX: LEFT HEART CATHETERIZATION WITH CORONARY/GRAFT ANGIOGRAM: SHX5450

## 2013-06-28 HISTORY — PX: CORONARY ANGIOPLASTY WITH STENT PLACEMENT: SHX49

## 2013-06-28 HISTORY — DX: Unspecified asthma, uncomplicated: J45.909

## 2013-06-28 LAB — PROTIME-INR
INR: 1.09 (ref 0.00–1.49)
PROTHROMBIN TIME: 13.9 s (ref 11.6–15.2)

## 2013-06-28 LAB — CBC WITH DIFFERENTIAL/PLATELET
BASOS ABS: 0 10*3/uL (ref 0.0–0.1)
Basophils Absolute: 0 10*3/uL (ref 0.0–0.1)
Basophils Relative: 1 % (ref 0–1)
Basophils Relative: 0 % (ref 0–1)
EOS ABS: 0.1 10*3/uL (ref 0.0–0.7)
Eosinophils Absolute: 0.1 10*3/uL (ref 0.0–0.7)
Eosinophils Relative: 2 % (ref 0–5)
Eosinophils Relative: 1 % (ref 0–5)
HCT: 42.1 % (ref 39.0–52.0)
HEMATOCRIT: 42.2 % (ref 39.0–52.0)
HEMOGLOBIN: 14.7 g/dL (ref 13.0–17.0)
HEMOGLOBIN: 14.7 g/dL (ref 13.0–17.0)
LYMPHS ABS: 1.2 10*3/uL (ref 0.7–4.0)
LYMPHS PCT: 25 % (ref 12–46)
LYMPHS PCT: 13 % (ref 12–46)
Lymphs Abs: 1.1 10*3/uL (ref 0.7–4.0)
MCH: 29.2 pg (ref 26.0–34.0)
MCH: 29.1 pg (ref 26.0–34.0)
MCHC: 34.9 g/dL (ref 30.0–36.0)
MCHC: 34.8 g/dL (ref 30.0–36.0)
MCV: 83.7 fL (ref 78.0–100.0)
MCV: 83.6 fL (ref 78.0–100.0)
MONO ABS: 1.1 10*3/uL — AB (ref 0.1–1.0)
MONOS PCT: 14 % — AB (ref 3–12)
Monocytes Absolute: 0.7 10*3/uL (ref 0.1–1.0)
Monocytes Relative: 12 % (ref 3–12)
NEUTROS ABS: 2.9 10*3/uL (ref 1.7–7.7)
NEUTROS ABS: 6.6 10*3/uL (ref 1.7–7.7)
NEUTROS PCT: 58 % (ref 43–77)
Neutrophils Relative %: 74 % (ref 43–77)
Platelets: 173 10*3/uL (ref 150–400)
Platelets: 168 10*3/uL (ref 150–400)
RBC: 5.03 MIL/uL (ref 4.22–5.81)
RBC: 5.05 MIL/uL (ref 4.22–5.81)
RDW: 13.3 % (ref 11.5–15.5)
RDW: 13.3 % (ref 11.5–15.5)
WBC: 5 10*3/uL (ref 4.0–10.5)
WBC: 8.8 10*3/uL (ref 4.0–10.5)

## 2013-06-28 LAB — I-STAT CHEM 8, ED
BUN: 16 mg/dL (ref 6–23)
CHLORIDE: 107 meq/L (ref 96–112)
CREATININE: 1.2 mg/dL (ref 0.50–1.35)
Calcium, Ion: 1.14 mmol/L (ref 1.13–1.30)
Glucose, Bld: 158 mg/dL — ABNORMAL HIGH (ref 70–99)
HCT: 43 % (ref 39.0–52.0)
Hemoglobin: 14.6 g/dL (ref 13.0–17.0)
POTASSIUM: 3.9 meq/L (ref 3.7–5.3)
Sodium: 137 mEq/L (ref 137–147)
TCO2: 24 mmol/L (ref 0–100)

## 2013-06-28 LAB — COMPREHENSIVE METABOLIC PANEL
ALBUMIN: 4.5 g/dL (ref 3.5–5.2)
ALT: 19 U/L (ref 0–53)
AST: 22 U/L (ref 0–37)
Alkaline Phosphatase: 62 U/L (ref 39–117)
BUN: 16 mg/dL (ref 6–23)
CALCIUM: 9.8 mg/dL (ref 8.4–10.5)
CO2: 27 mEq/L (ref 19–32)
CREATININE: 1.05 mg/dL (ref 0.50–1.35)
Chloride: 102 mEq/L (ref 96–112)
GFR calc Af Amer: 79 mL/min — ABNORMAL LOW (ref >90)
GFR calc non Af Amer: 68 mL/min — ABNORMAL LOW (ref >90)
Glucose, Bld: 120 mg/dL — ABNORMAL HIGH (ref 70–99)
POTASSIUM: 4.4 meq/L (ref 3.7–5.3)
Sodium: 143 mEq/L (ref 137–147)
Total Bilirubin: 0.6 mg/dL (ref 0.3–1.2)
Total Protein: 7.4 g/dL (ref 6.0–8.3)

## 2013-06-28 LAB — TROPONIN I
Troponin I: <0.3 ng/mL (ref <0.30)
Troponin I: <0.3 ng/mL (ref <0.30)

## 2013-06-28 LAB — I-STAT TROPONIN, ED: Troponin i, poc: 0 ng/mL (ref 0.00–0.08)

## 2013-06-28 LAB — CREATININE, SERUM
Creatinine, Ser: 0.96 mg/dL (ref 0.50–1.35)
GFR calc Af Amer: >90 mL/min (ref >90)
GFR, EST NON AFRICAN AMERICAN: 80 mL/min — AB (ref >90)

## 2013-06-28 LAB — HEMOGLOBIN AND HEMATOCRIT, BLOOD
HCT: 41 % (ref 39.0–52.0)
Hemoglobin: 14.2 g/dL (ref 13.0–17.0)

## 2013-06-28 LAB — TSH: TSH: 2.9 u[IU]/mL (ref 0.350–4.500)

## 2013-06-28 LAB — POCT ACTIVATED CLOTTING TIME: ACTIVATED CLOTTING TIME: 354 s

## 2013-06-28 LAB — PRO B NATRIURETIC PEPTIDE: PRO B NATRI PEPTIDE: 443.4 pg/mL — AB (ref 0–125)

## 2013-06-28 IMAGING — CR DG CHEST 2V
2 series · 2 of 2 positions shown · non-contrast
Comparison: DG CHEST 2 VIEW dated [DATE]; DG CHEST 2 VIEW dated
[DATE]

CLINICAL DATA: Chest pain.

EXAM:
CHEST  2 VIEW

[w chest pa]
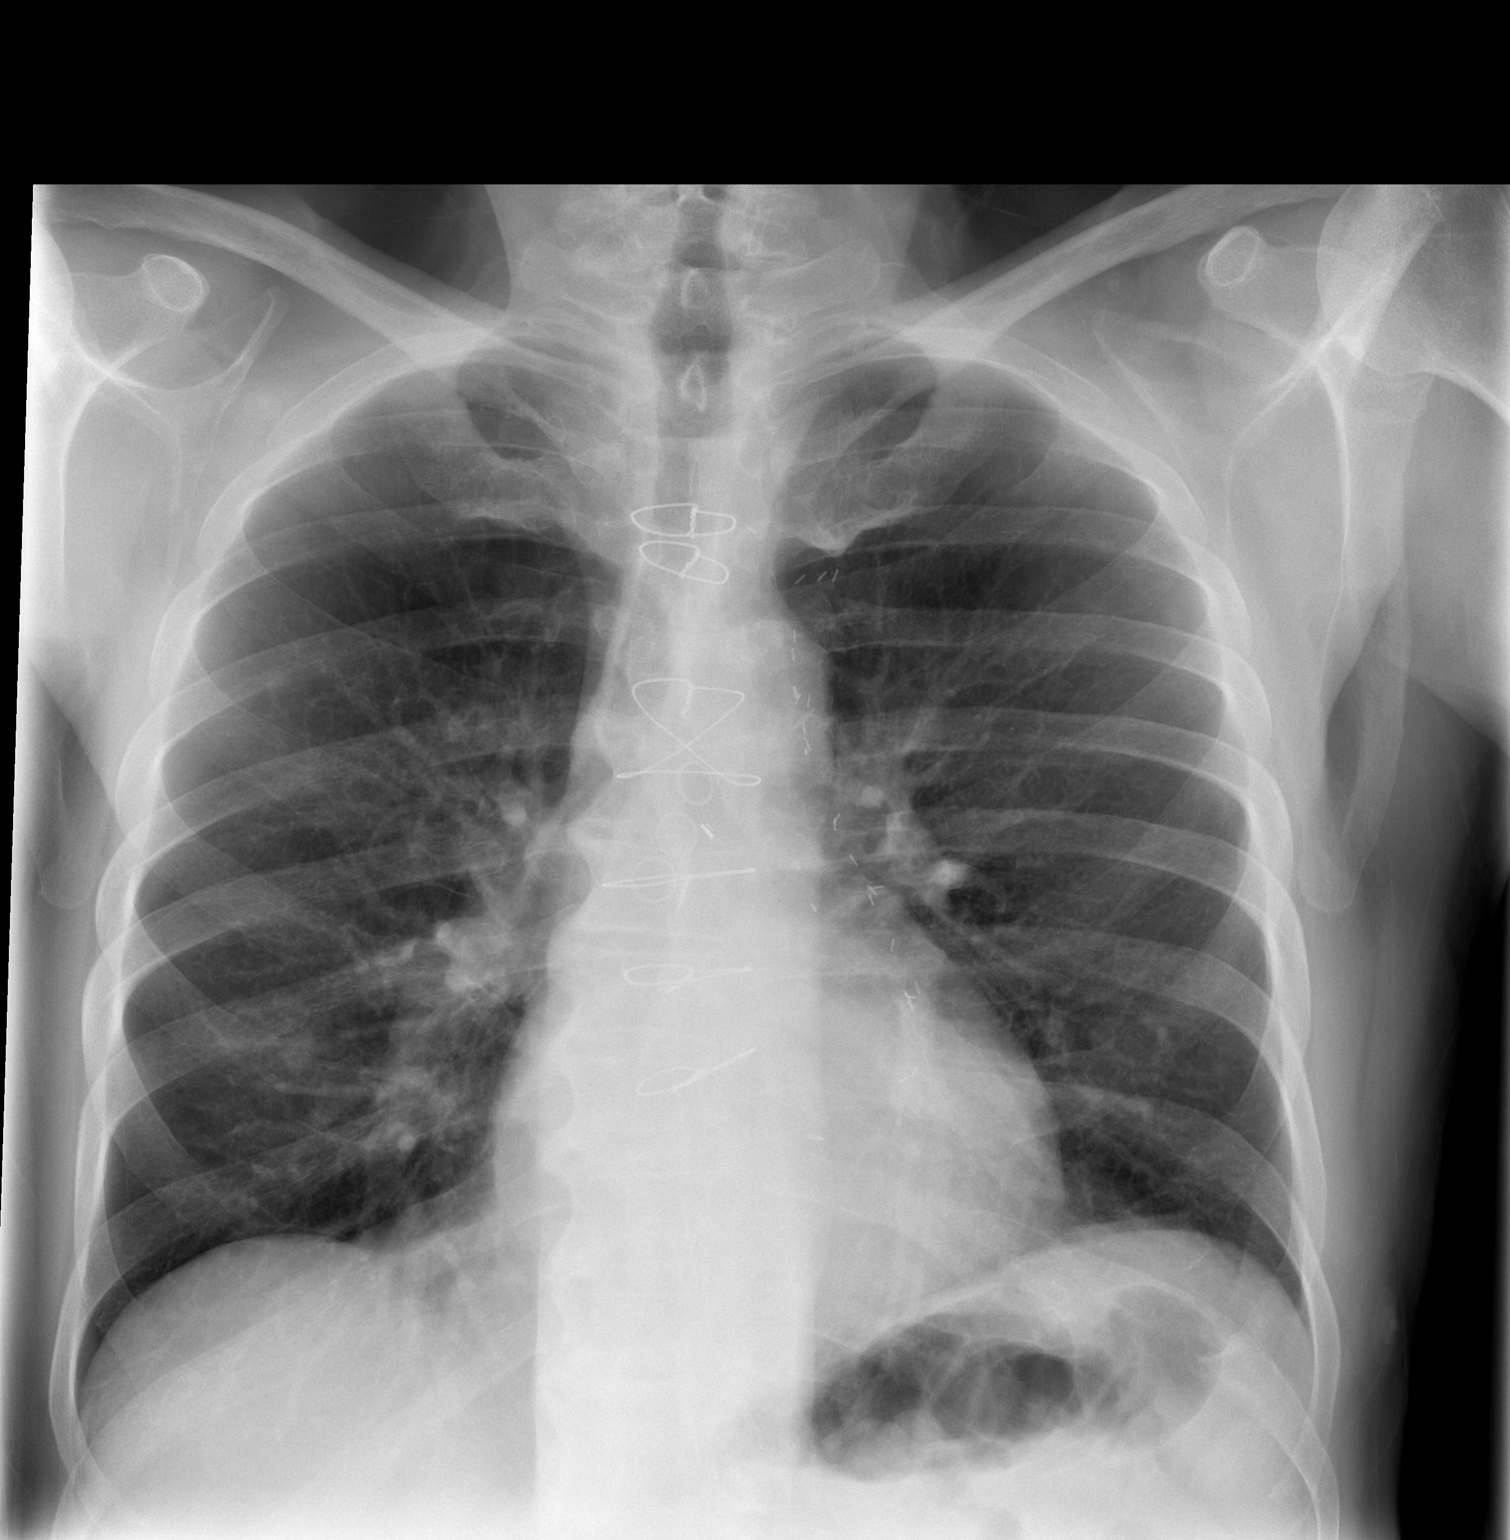

[w chest lat]
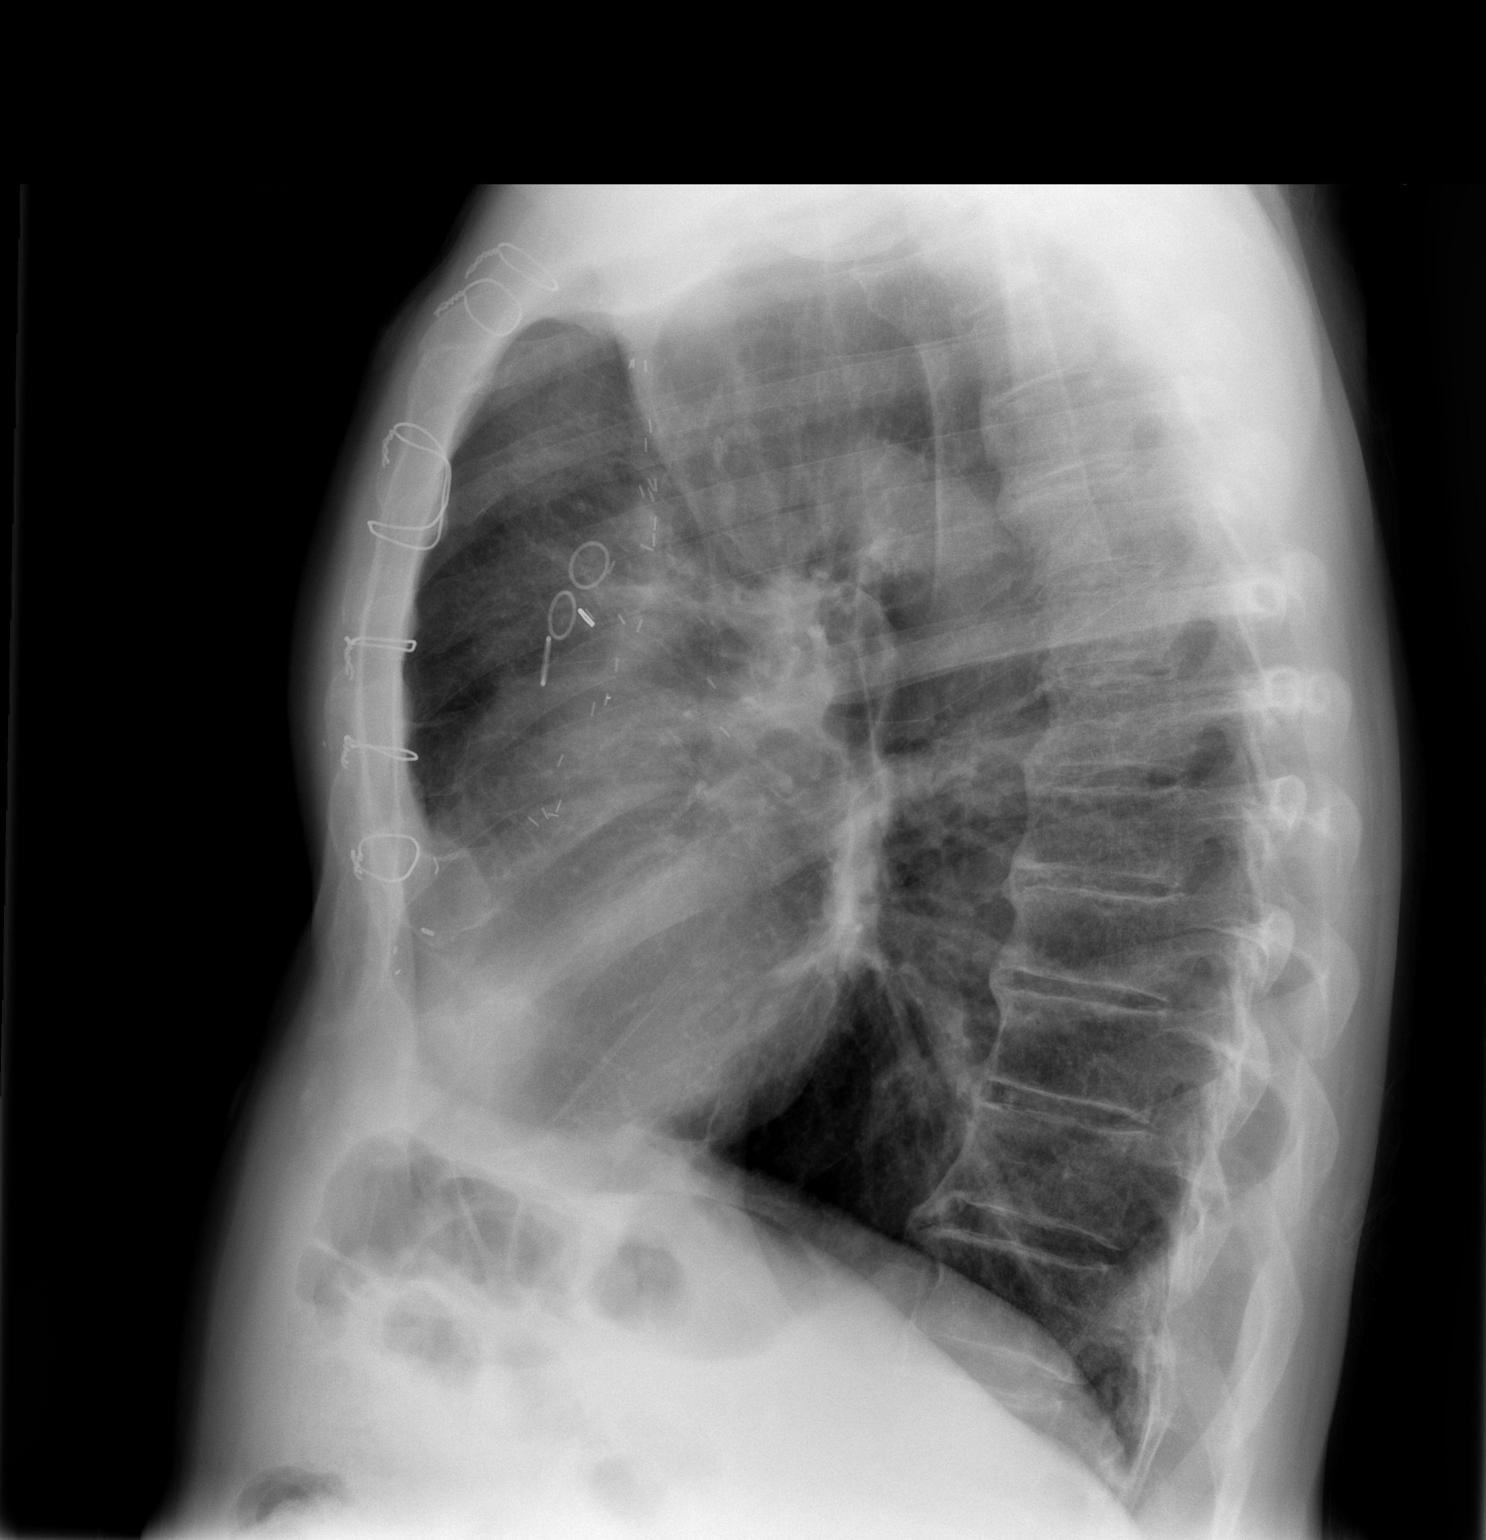

[2 of 2 positions shown; findings below may reference images not displayed]

FINDINGS: Mediastinum hilar structures normal. Question pulmonary nodule right
and left lung base, and right upper lobe. Chest CT suggested for
further evaluate. Lungs clear of acute infiltrates. Prior CABG.
Cardiomegaly. Normal pulmonary vascularity. No pleural effusion or
pneumothorax. Degenerative changes thoracic spine.
IMPRESSION: 1. Questionable new bilateral pulmonary nodules, chest CT suggested
for further evaluation.
2. Prior CABG.  Cardiomegaly, no CHF.

## 2013-06-28 IMAGING — CT CT CHEST W/O CM
2 of 3 series · 15 of 36 positions shown, 18 images · non-contrast
Comparison: Chest x-ray [DATE]

CLINICAL DATA: Chest pain. Assess pulmonary nodule seen on chest
x-ray.

EXAM:
CT CHEST WITHOUT CONTRAST
TECHNIQUE: Multidetector CT imaging of the chest was performed following the
standard protocol without IV contrast.

[Series 2: thorax 5.0 i31f 1 · axial · 0.79mm/px · z∈[-399,-44]mm · 12 of 85 slices shown, 15 images]
[im 7/85  mediastinal]
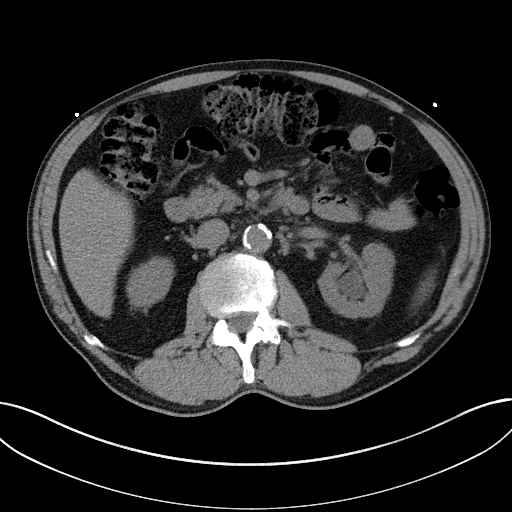
[im 7/85  lung]
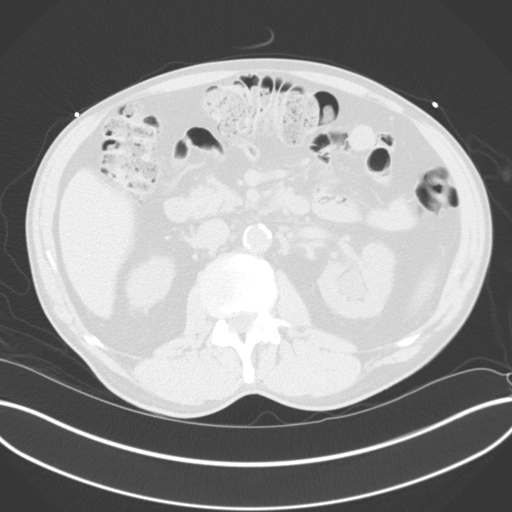
[im 13/85  lung]
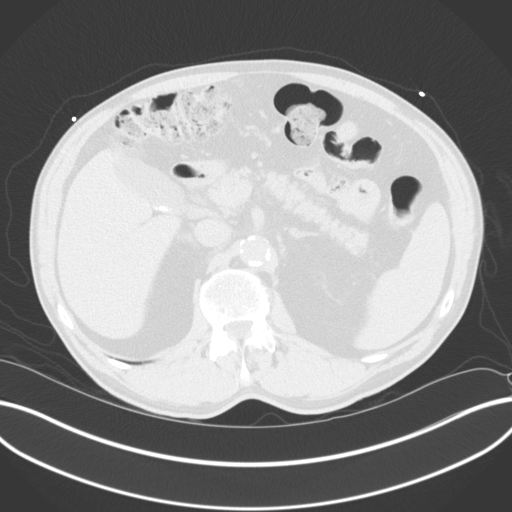
[im 19/85  lung]
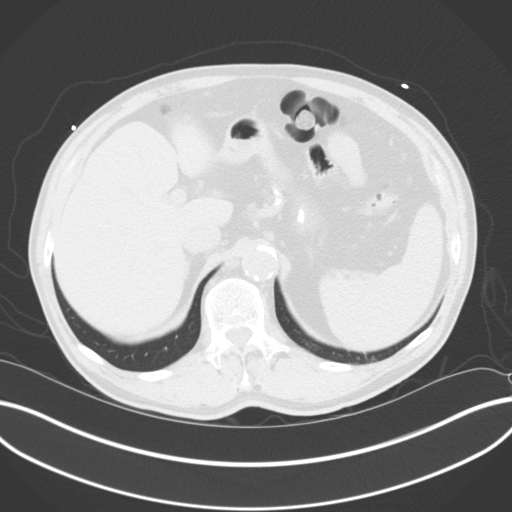
[im 25/85  lung]
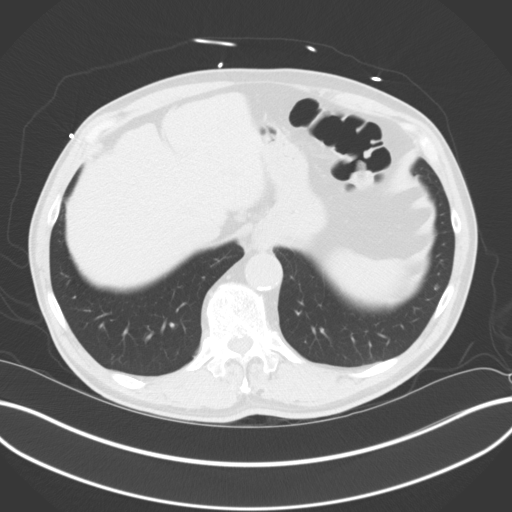
[im 32/85  mediastinal]
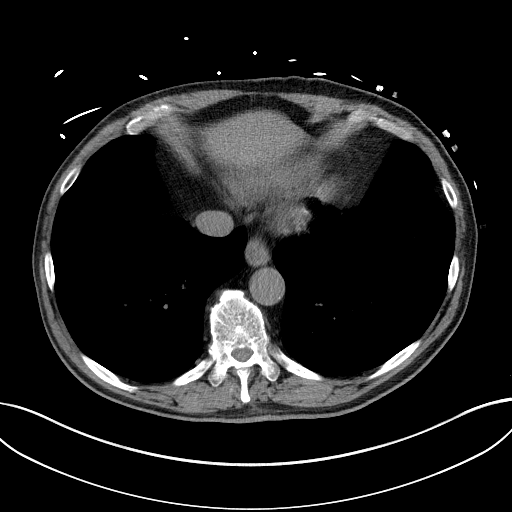
[im 32/85  lung]
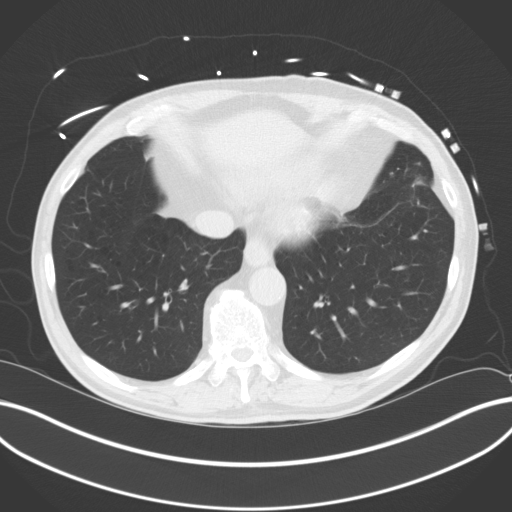
[im 38/85  lung]
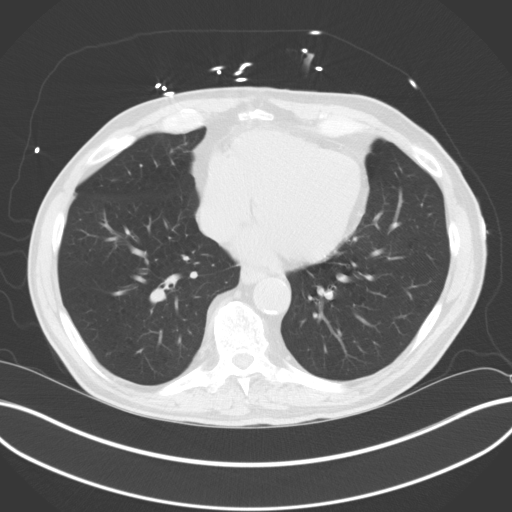
[im 47/85  lung]
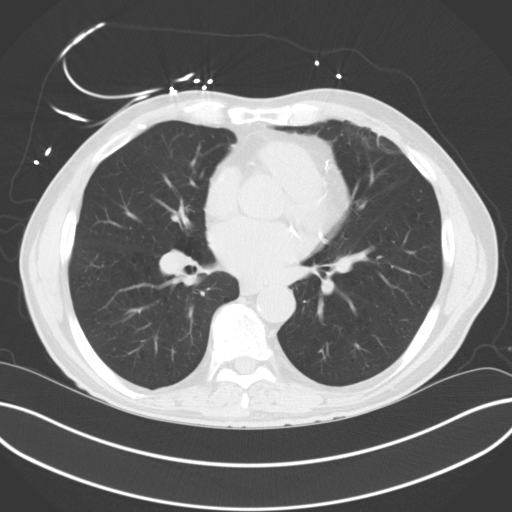
[im 53/85  lung]
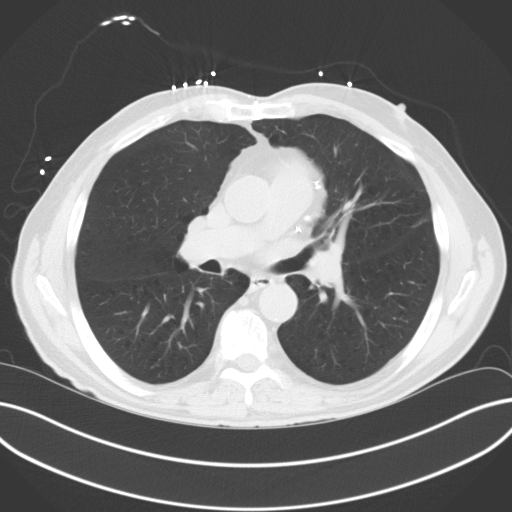
[im 60/85  mediastinal]
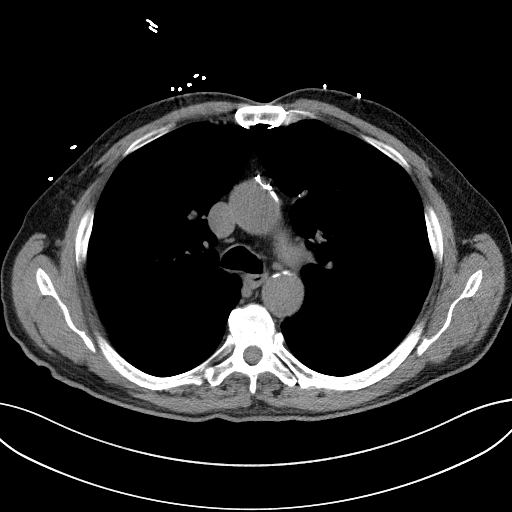
[im 60/85  lung]
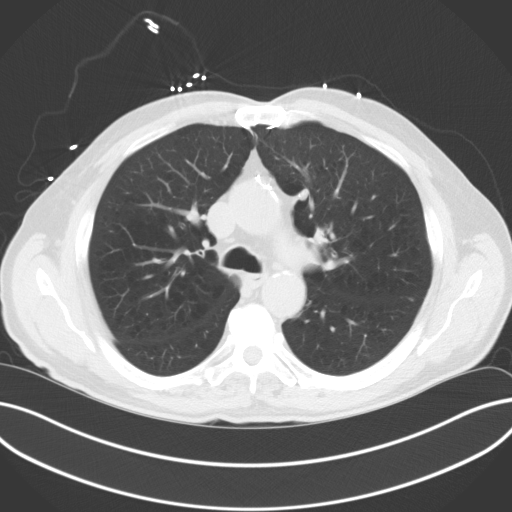
[im 66/85  lung]
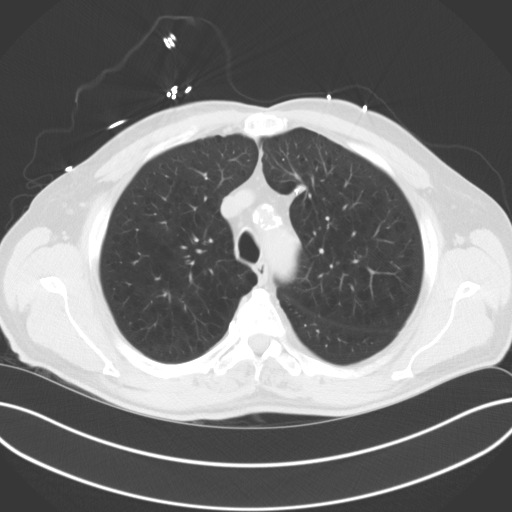
[im 72/85  lung]
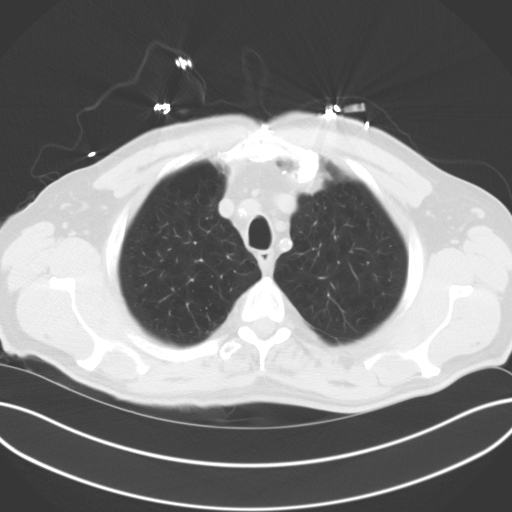
[im 78/85  lung]
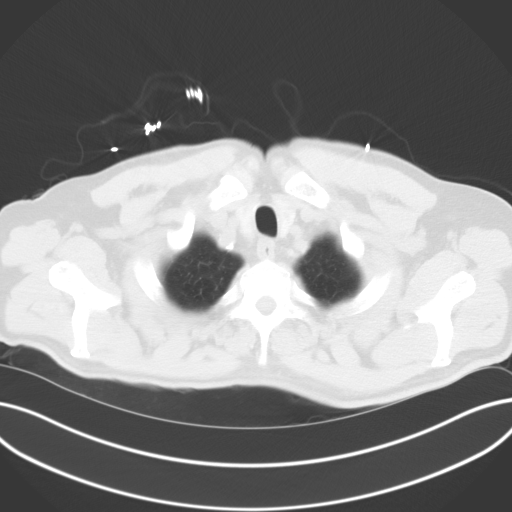

[Series 5: coronal · coronal · 0.87mm/px · 3 of 95 slices shown]
[im 19/95  lung]
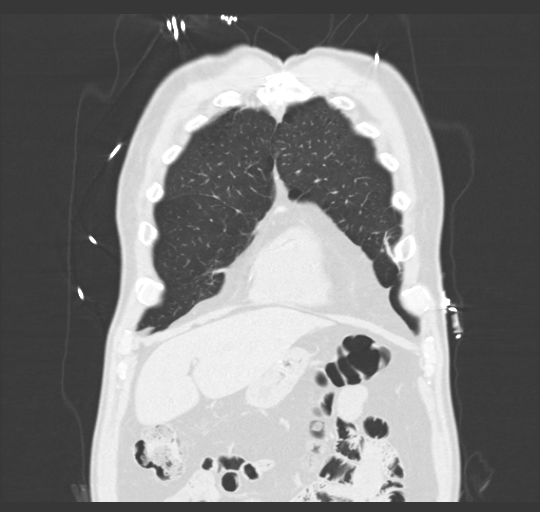
[im 38/95  lung]
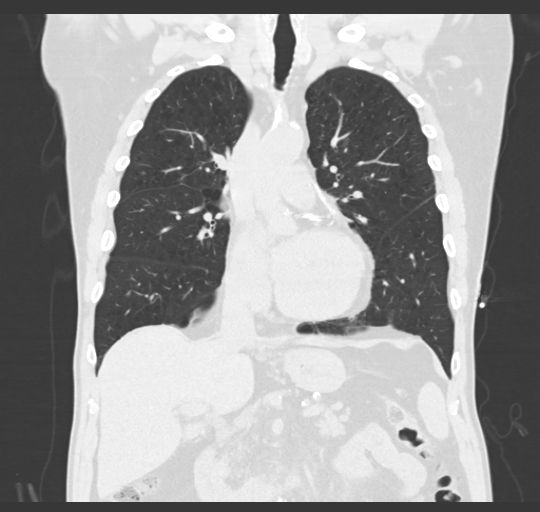
[im 57/95  lung]
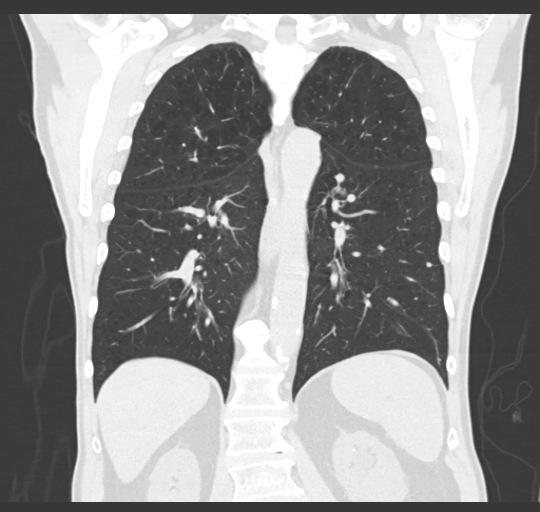

[15 of 36 positions shown; findings below may reference images not displayed]

FINDINGS: Linear densities within the lingula and right middle lobe likely
reflect scarring and corresponds to some of the density seen on
prior chest x-ray. No nodular density is noted in the right upper
lobe in the area questioned on chest x-ray.

There is a 5 mm well-circumscribed rounded nodule in the left lower
lobe on image 45, nonspecific. 6 mm posterior right lower lobe
nodule on image 45. Mild centrilobular emphysema. No pleural
effusions.

Prior CABG. Heart is normal size. Aorta is normal caliber. No
mediastinal, hilar, or axillary adenopathy. Chest wall soft tissues
are unremarkable.

Imaging into the upper abdomen demonstrates left sided
hydronephrosis, partially imaged. There is a nonobstructing stone in
the midpole measuring 5 mm. Layering gallstones within the
gallbladder.
IMPRESSION: Small 5-6 mm pulmonary nodules in the lower lobes. If the patient is
at high risk for bronchogenic carcinoma, follow-up chest CT at 6-12
months is recommended. If the patient is at low risk for
bronchogenic carcinoma, follow-up chest CT at 12 months is
recommended. This recommendation follows the consensus statement:
Guidelines for Management of Small Pulmonary Nodules Detected on CT
Scans: A Statement from the [HOSPITAL] as published in

Scarring in the lingula and right middle lobe corresponding to some
of the densities on chest x-ray.

Left-sided hydronephrosis and nephrolithiasis. Cannot exclude left
ureteral stone. Recommend clinical correlation for left flank pain
and hematuria.

Cholelithiasis.

## 2013-06-28 SURGERY — LEFT HEART CATHETERIZATION WITH CORONARY/GRAFT ANGIOGRAM
Anesthesia: LOCAL

## 2013-06-28 MED ORDER — ISOSORBIDE MONONITRATE ER 30 MG PO TB24
30.0000 mg | ORAL_TABLET | Freq: Every day | ORAL | Status: DC
  Administered 2013-06-29 – 2013-06-30 (×2): 30 mg via ORAL
  Filled 2013-06-28 (×4): qty 1

## 2013-06-28 MED ORDER — AMLODIPINE BESYLATE 5 MG PO TABS
5.0000 mg | ORAL_TABLET | Freq: Every day | ORAL | Status: DC
  Administered 2013-06-29 – 2013-06-30 (×2): 5 mg via ORAL
  Filled 2013-06-28 (×3): qty 1

## 2013-06-28 MED ORDER — HYDROCHLOROTHIAZIDE 12.5 MG PO CAPS
12.5000 mg | ORAL_CAPSULE | Freq: Every day | ORAL | Status: DC
  Administered 2013-06-29 – 2013-06-30 (×2): 12.5 mg via ORAL
  Filled 2013-06-28 (×3): qty 1

## 2013-06-28 MED ORDER — ONDANSETRON HCL 4 MG/2ML IJ SOLN
INTRAMUSCULAR | Status: AC
  Filled 2013-06-28: qty 2

## 2013-06-28 MED ORDER — MIDAZOLAM HCL 2 MG/2ML IJ SOLN
INTRAMUSCULAR | Status: AC
Start: 2013-06-28 — End: 2013-06-28
  Filled 2013-06-28: qty 2

## 2013-06-28 MED ORDER — CLOPIDOGREL BISULFATE 75 MG PO TABS: 75.0000 mg | ORAL_TABLET | Freq: Every day | ORAL | Status: DC

## 2013-06-28 MED ORDER — ONDANSETRON HCL 4 MG/2ML IJ SOLN
4.0000 mg | Freq: Four times a day (QID) | INTRAMUSCULAR | Status: DC | PRN
  Administered 2013-06-28 – 2013-06-29 (×2): 4 mg via INTRAVENOUS
  Filled 2013-06-28: qty 2

## 2013-06-28 MED ORDER — HYDRALAZINE HCL 20 MG/ML IJ SOLN
10.0000 mg | INTRAMUSCULAR | Status: DC | PRN
  Administered 2013-06-28: 10 mg via INTRAVENOUS
  Filled 2013-06-28: qty 1

## 2013-06-28 MED ORDER — HEPARIN (PORCINE) IN NACL 2-0.9 UNIT/ML-% IJ SOLN
INTRAMUSCULAR | Status: AC
  Filled 2013-06-28: qty 500

## 2013-06-28 MED ORDER — ASPIRIN 81 MG PO CHEW
324.0000 mg | CHEWABLE_TABLET | Freq: Once | ORAL | Status: AC
  Administered 2013-06-28: 324 mg via ORAL
  Filled 2013-06-28: qty 4

## 2013-06-28 MED ORDER — TICAGRELOR 90 MG PO TABS
90.0000 mg | ORAL_TABLET | Freq: Two times a day (BID) | ORAL | Status: DC
  Administered 2013-06-29: 90 mg via ORAL
  Filled 2013-06-28 (×4): qty 1

## 2013-06-28 MED ORDER — LIDOCAINE HCL (PF) 1 % IJ SOLN
INTRAMUSCULAR | Status: AC
  Filled 2013-06-28: qty 30

## 2013-06-28 MED ORDER — MIDAZOLAM HCL 2 MG/2ML IJ SOLN
INTRAMUSCULAR | Status: AC
  Filled 2013-06-28: qty 2

## 2013-06-28 MED ORDER — SIMVASTATIN 20 MG PO TABS: 20.0000 mg | ORAL_TABLET | Freq: Every day | ORAL | Status: DC

## 2013-06-28 MED ORDER — SODIUM CHLORIDE 0.9 % IV SOLN
0.2500 mg/kg/h | INTRAVENOUS | Status: DC
  Filled 2013-06-28: qty 250

## 2013-06-28 MED ORDER — SODIUM CHLORIDE 0.9 % IV SOLN: 250.0000 mL | INTRAVENOUS | Status: DC | PRN

## 2013-06-28 MED ORDER — HEPARIN (PORCINE) IN NACL 2-0.9 UNIT/ML-% IJ SOLN
INTRAMUSCULAR | Status: AC
  Filled 2013-06-28: qty 1000

## 2013-06-28 MED ORDER — SODIUM CHLORIDE 0.9 % IV SOLN
Freq: Once | INTRAVENOUS | Status: AC
  Administered 2013-06-28: 17:00:00 via INTRAVENOUS

## 2013-06-28 MED ORDER — SODIUM CHLORIDE 0.9 % IJ SOLN
3.0000 mL | INTRAMUSCULAR | Status: DC | PRN
  Administered 2013-06-28: 3 mL via INTRAVENOUS

## 2013-06-28 MED ORDER — HEPARIN (PORCINE) IN NACL 100-0.45 UNIT/ML-% IJ SOLN
800.0000 [IU]/h | INTRAMUSCULAR | Status: DC
  Filled 2013-06-28: qty 250

## 2013-06-28 MED ORDER — PROCHLORPERAZINE EDISYLATE 5 MG/ML IJ SOLN
10.0000 mg | Freq: Four times a day (QID) | INTRAMUSCULAR | Status: DC | PRN
  Administered 2013-06-28: 10 mg via INTRAVENOUS
  Filled 2013-06-28: qty 2

## 2013-06-28 MED ORDER — FENTANYL CITRATE 0.05 MG/ML IJ SOLN
INTRAMUSCULAR | Status: AC
  Filled 2013-06-28: qty 2

## 2013-06-28 MED ORDER — ASPIRIN EC 81 MG PO TBEC
81.0000 mg | DELAYED_RELEASE_TABLET | Freq: Every day | ORAL | Status: DC
  Administered 2013-06-29 – 2013-06-30 (×2): 81 mg via ORAL
  Filled 2013-06-28 (×4): qty 1

## 2013-06-28 MED ORDER — ASPIRIN EC 81 MG PO TBEC: 81.0000 mg | DELAYED_RELEASE_TABLET | Freq: Every day | ORAL | Status: DC

## 2013-06-28 MED ORDER — BENAZEPRIL HCL 20 MG PO TABS
20.0000 mg | ORAL_TABLET | Freq: Every day | ORAL | Status: DC
  Administered 2013-06-29 – 2013-06-30 (×2): 20 mg via ORAL
  Filled 2013-06-28 (×3): qty 1

## 2013-06-28 MED ORDER — TICAGRELOR 90 MG PO TABS
ORAL_TABLET | ORAL | Status: AC
Start: 2013-06-28 — End: 2013-06-28
  Filled 2013-06-28: qty 2

## 2013-06-28 MED ORDER — TICAGRELOR 90 MG PO TABS
ORAL_TABLET | ORAL | Status: AC
  Filled 2013-06-28: qty 1

## 2013-06-28 MED ORDER — ATENOLOL 50 MG PO TABS
50.0000 mg | ORAL_TABLET | Freq: Every day | ORAL | Status: DC
  Administered 2013-06-29 – 2013-06-30 (×2): 50 mg via ORAL
  Filled 2013-06-28 (×2): qty 1

## 2013-06-28 MED ORDER — SODIUM CHLORIDE 0.9 % IJ SOLN: 3.0000 mL | Freq: Two times a day (BID) | INTRAMUSCULAR | Status: DC

## 2013-06-28 MED ORDER — NITROGLYCERIN 0.4 MG SL SUBL
0.4000 mg | SUBLINGUAL_TABLET | SUBLINGUAL | Status: DC | PRN
  Filled 2013-06-28: qty 1

## 2013-06-28 MED ORDER — DIAZEPAM 5 MG PO TABS
5.0000 mg | ORAL_TABLET | ORAL | Status: AC
  Administered 2013-06-28: 5 mg via ORAL
  Filled 2013-06-28: qty 1

## 2013-06-28 MED ORDER — BIVALIRUDIN 250 MG IV SOLR
INTRAVENOUS | Status: AC
  Filled 2013-06-28: qty 250

## 2013-06-28 MED ORDER — ACETAMINOPHEN 325 MG PO TABS
650.0000 mg | ORAL_TABLET | ORAL | Status: DC | PRN
  Administered 2013-06-28 – 2013-06-29 (×3): 650 mg via ORAL
  Filled 2013-06-28 (×3): qty 2

## 2013-06-28 MED ORDER — HEPARIN SODIUM (PORCINE) 5000 UNIT/ML IJ SOLN
4000.0000 [IU] | Freq: Once | INTRAMUSCULAR | Status: AC
Start: 2013-06-28 — End: 2013-06-28
  Administered 2013-06-28: 4000 [IU] via INTRAVENOUS

## 2013-06-28 MED ORDER — PRAVASTATIN SODIUM 40 MG PO TABS
40.0000 mg | ORAL_TABLET | Freq: Every day | ORAL | Status: DC
  Administered 2013-06-29: 16:00:00 40 mg via ORAL
  Filled 2013-06-28 (×4): qty 1

## 2013-06-28 MED ORDER — HEPARIN SODIUM (PORCINE) 5000 UNIT/ML IJ SOLN
5000.0000 [IU] | Freq: Three times a day (TID) | INTRAMUSCULAR | Status: DC
  Filled 2013-06-28: qty 1

## 2013-06-28 MED ORDER — NITROGLYCERIN 0.2 MG/ML ON CALL CATH LAB
INTRAVENOUS | Status: AC
  Filled 2013-06-28: qty 1

## 2013-06-28 MED ORDER — SODIUM CHLORIDE 0.9 % IV SOLN
1.0000 mL/kg/h | INTRAVENOUS | Status: DC
  Administered 2013-06-28: 1 mL/kg/h via INTRAVENOUS

## 2013-06-28 NOTE — Progress Notes (Signed)
Pt having nausea and vomiting.  Pt had 500cc of coffee ground emesis.  Pt denied CP, SOB, Dr.Kelly aware of emesis & orders received.  Will continue to monitor patient.

## 2013-06-28 NOTE — ED Notes (Signed)
Dr. Belfi at bedside 

## 2013-06-28 NOTE — Progress Notes (Signed)
UR completed Jonet Mathies K. Kennet Mccort, RN, BSN, [place], CCM  06/28/2013 6:38 PM

## 2013-06-28 NOTE — Progress Notes (Signed)
Site area: right groin  Site Prior to Removal:  Level 0  Pressure Applied For 20 MINUTES    Minutes Beginning at 1715  Manual:   yes  Patient Status During Pull:  stable  Post Pull Groin Site:  Level 0  Post Pull Instructions Given:  yes  Post Pull Pulses Present:  yes  Dressing Applied:  yes  Comments:

## 2013-06-28 NOTE — ED Notes (Signed)
Pt reports intermittent left sided cp since Monday night. Denies SOB, n/v. Skin warm and dry. Has cardiac hx.

## 2013-06-28 NOTE — ED Notes (Signed)
Patient transported to CT 

## 2013-06-28 NOTE — ED Provider Notes (Signed)
CSN: [id number]     Arrival date & time 06/28/13  0757 History   First MD Initiated Contact with Patient 06/28/13 0801     Chief Complaint  Patient presents with  . Chest Pain     (Consider location/radiation/quality/duration/timing/severity/associated sxs/prior Treatment) HPI Comments: Patient presents with chest pain. He has a history of coronary artery disease status post CABG in 1990. He also had stenting of his vein graft going to the obtuse marginal in 2013. He presents today with chest tightness. He describes it as a tightness across the left side of his chest. Otherwise nonradiating. It started on Monday night and was only with exertion. Last night he says it was while he was trying to lay down in bed. The episodes lasting about 30 minutes. He has no associated shortness of breath but no nausea or diaphoresis. He denies any increase in his pedal edema or calf tenderness. He denies any cough or chest congestion. He denies any fevers or chills. He has nitroglycerin at home, but he says it's outdated and he did not take any for these anginal symptoms. He does feel that his discomfort today similar to when he was admitted in 2013 for non-STEMI.  Patient is a 73 y.o. male presenting with chest pain.  Chest Pain Associated symptoms: shortness of breath   Associated symptoms: no abdominal pain, no back pain, no cough, no diaphoresis, no dizziness, no fatigue, no fever, no headache, no nausea, no numbness, not vomiting and no weakness     Past Medical History  Diagnosis Date  . CAD in native artery 1998    Multivessel disease referred for CABG  . S/P CABG x 3 1990    LIMA-LAD, SVG-OM2-OM 3, SVG to PDA, SVG-D1  . CAD (coronary artery disease) of bypass graft 2003    Positive stress test: 100% occlusion SVG-PDA, 80% in D1 through SVG, 80% SVG-OM, patent LIMA-LAD; PCI SVG-OM (3.0 x 8 Zeta stent - going into native OM) and native D1 - PTCA only  . History of: Non-STEMI (non-ST elevated  myocardial infarction) 01/13/2012    SVG-diagonal occluded, 50% distal left main, 70-80% proximal LAD and 90% mid.  Patent LIMA with 70% lesion post anastomosis.  SVG to RCA occluded, SVG-OM2-3 90% sequential limb stenosis -- PCI SVG-OM 2 -OM 3: Xience Xpedition DES 2.75 MM x15 MM (3 MM)  . Presence of drug coated stent in left circumflex coronary artery     Xience expedition DES -- SVG-OM 2 -OM 3.;  Previously placed BMS into first anastomosis.  [name] HTN (hypertension) 01/13/2012  . History of nuclear stress test 01/2011    infarct v. scar with with fixed inferoseptal defecrt  . Carotid bruit     asymptomatic; carotid doppler 01/2012 - R bulb/prox ICA mild-mod amt of fibrous plaque, L subclavian 70-99% diameter reduction, L bulb/ICA mild amt of fibrous plaque  . Hypertension   . Dyslipidemia, goal LDL below 70    Past Surgical History  Procedure Laterality Date  . Coronary artery bypass graft  1998    SVG-diagonal, DVG-OM, DVG-PDA, LIMA-LAD  . Coronary angioplasty  2003    BMS 3.0 mm x 8 mm stent to SVG to OM & cuttin balloon to diagonal; 100% occluded vein graft to PDA, patent LIMA; 80% stenosis in SVG-diagnal; vein graft stenosis in OM  . Coronary angioplasty with stent placement  01/2012    PCI & stenting of SVG-OM2 to OM3 segment with Xience Xpedition 2.28mmx15mm DES   Family History  Problem  Relation Age of Onset  . Cancer Mother   . Heart attack Father    History  Substance Use Topics  . Smoking status: Former Smoker    Quit date: 03/02/1990  . Smokeless tobacco: Never Used  . Alcohol Use: No     Comment: occ, 10/18/12-no alcohol use    Review of Systems  Constitutional: Negative for fever, chills, diaphoresis and fatigue.  HENT: Negative for congestion, rhinorrhea and sneezing.   Eyes: Negative.   Respiratory: Positive for shortness of breath. Negative for cough and chest tightness.   Cardiovascular: Positive for chest pain. Negative for leg swelling.  Gastrointestinal:  Negative for nausea, vomiting, abdominal pain, diarrhea and blood in stool.  Genitourinary: Negative for frequency, hematuria, flank pain and difficulty urinating.  Musculoskeletal: Negative for arthralgias and back pain.  Skin: Negative for rash.  Neurological: Negative for dizziness, speech difficulty, weakness, numbness and headaches.      Allergies  Keflex; Lipitor; Sulfa antibiotics; and Zocor  Home Medications   Prior to Admission medications   Medication Sig Start Date End Date Taking? Authorizing Provider  amLODipine-benazepril (LOTREL) 10-20 MG per capsule Take 1 capsule by mouth daily. 10/07/12   [name], MD  aspirin EC 81 MG tablet Take 81 mg by mouth daily.    Historical Provider, MD  atenolol (TENORMIN) 50 MG tablet TAKE ONE TABLET BY MOUTH EVERY DAY 04/05/13   [name], MD  clopidogrel (PLAVIX) 75 MG tablet TAKE ONE TABLET BY MOUTH EVERY DAY 02/17/13   [name], MD  hydrochlorothiazide (MICROZIDE) 12.5 MG capsule TAKE ONE CAPSULE BY MOUTH ONCE DAILY 04/26/13   [name], MD  nitroGLYCERIN (NITROSTAT) 0.4 MG SL tablet Place 1 tablet (0.4 mg total) under the tongue every 5 (five) minutes x 3 doses as needed for chest pain. 01/15/12   Tarri Fuller, [name]  pravastatin (PRAVACHOL) 40 MG tablet Take 1 tablet (40 mg total) by mouth daily. 12/09/12   [name], MD   BP 152/68  Pulse 64  Temp(Src) 97.8 F (36.6 C) (Oral)  Resp 15  Wt 185 lb (83.915 kg)  SpO2 98% Physical Exam  Constitutional: He is oriented to person, place, and time. He appears well-developed and well-nourished.  HENT:  Head: Normocephalic and atraumatic.  Eyes: Pupils are equal, round, and reactive to light.  Neck: Normal range of motion. Neck supple.  Cardiovascular: Normal rate, regular rhythm and normal heart sounds.   Pulmonary/Chest: Effort normal and breath sounds normal. No respiratory distress. He has no wheezes. He has no rales. He exhibits no tenderness.  Abdominal:  Soft. Bowel sounds are normal. There is no tenderness. There is no rebound and no guarding.  Musculoskeletal: Normal range of motion. He exhibits edema.  Mild pitting edema of RLE, no calf tenderness (pt says this is chronic and unchanged, is leg where he had vein graft removed)  Lymphadenopathy:    He has no cervical adenopathy.  Neurological: He is alert and oriented to person, place, and time.  Skin: Skin is warm and dry. No rash noted.  Psychiatric: He has a normal mood and affect.    ED Course  Procedures (including critical care time) Labs Review Results for orders placed during the hospital encounter of 06/28/13  CBC WITH DIFFERENTIAL      Result Value Ref Range   WBC 5.0  4.0 - 10.5 K/uL   RBC 5.03  4.22 - 5.81 MIL/uL   Hemoglobin 14.7  13.0 - 17.0 g/dL  HCT 42.1  39.0 - 52.0 %   MCV 83.7  78.0 - 100.0 fL   MCH 29.2  26.0 - 34.0 pg   MCHC 34.9  30.0 - 36.0 g/dL   RDW 27.7  41.2 - 87.8 %   Platelets 173  150 - 400 K/uL   Neutrophils Relative % 58  43 - 77 %   Neutro Abs 2.9  1.7 - 7.7 K/uL   Lymphocytes Relative 25  12 - 46 %   Lymphs Abs 1.2  0.7 - 4.0 K/uL   Monocytes Relative 14 (*) 3 - 12 %   Monocytes Absolute 0.7  0.1 - 1.0 K/uL   Eosinophils Relative 2  0 - 5 %   Eosinophils Absolute 0.1  0.0 - 0.7 K/uL   Basophils Relative 1  0 - 1 %   Basophils Absolute 0.0  0.0 - 0.1 K/uL  I-STAT CHEM 8, ED      Result Value Ref Range   Sodium 137  137 - 147 mEq/L   Potassium 3.9  3.7 - 5.3 mEq/L   Chloride 107  96 - 112 mEq/L   BUN 16  6 - 23 mg/dL   Creatinine, Ser 6.76  0.50 - 1.35 mg/dL   Glucose, Bld 720 (*) 70 - 99 mg/dL   Calcium, Ion 9.47  0.96 - 1.30 mmol/L   TCO2 24  0 - 100 mmol/L   Hemoglobin 14.6  13.0 - 17.0 g/dL   HCT 28.3  66.2 - 94.7 %  I-STAT TROPOININ, ED      Result Value Ref Range   Troponin i, poc 0.00  0.00 - 0.08 ng/mL   Comment 3            Dg Chest 2 View  06/28/2013   CLINICAL DATA:  Chest pain.  EXAM: CHEST  2 VIEW  COMPARISON:  DG  CHEST 2 VIEW dated 01/13/2012; DG CHEST 2 VIEW dated 07/27/2007  FINDINGS: Mediastinum hilar structures normal. Question pulmonary nodule right and left lung base, and right upper lobe. Chest CT suggested for further evaluate. Lungs clear of acute infiltrates. Prior CABG. Cardiomegaly. Normal pulmonary vascularity. No pleural effusion or pneumothorax. Degenerative changes thoracic spine.  IMPRESSION: 1. Questionable new bilateral pulmonary nodules, chest CT suggested for further evaluation. 2. Prior CABG.  Cardiomegaly, no CHF.   Electronically Signed   By: Maisie Fus  Register   On: 06/28/2013 08:54      Imaging Review Dg Chest 2 View  06/28/2013   CLINICAL DATA:  Chest pain.  EXAM: CHEST  2 VIEW  COMPARISON:  DG CHEST 2 VIEW dated 01/13/2012; DG CHEST 2 VIEW dated 07/27/2007  FINDINGS: Mediastinum hilar structures normal. Question pulmonary nodule right and left lung base, and right upper lobe. Chest CT suggested for further evaluate. Lungs clear of acute infiltrates. Prior CABG. Cardiomegaly. Normal pulmonary vascularity. No pleural effusion or pneumothorax. Degenerative changes thoracic spine.  IMPRESSION: 1. Questionable new bilateral pulmonary nodules, chest CT suggested for further evaluation. 2. Prior CABG.  Cardiomegaly, no CHF.   Electronically Signed   By: Maisie Fus  Register   On: 06/28/2013 08:54     EKG Interpretation None      Date: 06/28/2013  Rate: 68  Rhythm: normal sinus rhythm  QRS Axis: normal  Intervals: normal  ST/T Wave abnormalities: nonspecific ST/T changes and ST depressions laterally  Conduction Disutrbances:none  Narrative Interpretation:   Old EKG Reviewed: unchanged    MDM   Final diagnoses:  Unstable angina  Pt was seen by cards.  No ischemic changes on EKG, but has a concerning story of unstable angina.  [name] has seen pt, will arrange for pt to go to cath lab this am.  Per [name], will hold off on heparin since he is going straight to cath lab.  Pt  given asa.  Denies current pain or pressure.    Malvin Johns, MD 06/28/13 1036

## 2013-06-28 NOTE — ED Notes (Signed)
Patient returned from X-ray 

## 2013-06-28 NOTE — Progress Notes (Signed)
Brief X-Cover Note  Mr. Khoi Hamberger is a 73 yo man with PMH of CABG who had PCI today and loaded on ticagrelor. At 19:xx he has an episode of emesis which was dark tinged and had speckles of dark material, "borderline coffee ground emesis." He had one other episode of vomiting this evening. This is new for him. He hasn't had GERD or an ucler per my bedside history. He feels well, he has two 20g peripheral IVs. CBC was initially stable, recheck CBC was stable (14/42). I discussed findings with him. We will continue planned 02:xx ticagrelor given stent but hold off on any heparin gtt. Close monitoring, early AM repeat CBC.   Jules Husbands, MD

## 2013-06-28 NOTE — CV Procedure (Addendum)
Xavier Giles is a 73 y.o. male    [id number]  [id number] LOCATION:  FACILITY: [facility]  PHYSICIAN: Troy Sine, MD, [facility] 1941/01/25   DATE OF PROCEDURE:  06/28/2013    CARDIAC CATHETERIZATION     HISTORY:  Xavier Giles. [name] is a 73 year old patient who is a former patient of [name] and now sees [name].  The patient underwent CABG revascularization surgery in 1998 and had a LIMA graft placed to the LAD, sequential vein graft placed to the OM2 and OM 3 vessel, vein graft to a diagonal and vein graft to the distal right coronary artery.  Patient had a stent placed in 2003 to the sequential limb and 2013 at last catheterization by [name] had a stent placed in the sequential limb segment after the OM to take off.  At that time, the vein graft to the diagonal, and the vein graft to the RCA with were occluded.  The LIMA was patent, but there was 70% stenosis beyond the LIMA insertion.  The patient had done well recently.  He has developed exertional chest pain.  He was awakened from sleep.  This morning with class IV unstable angina.  He was admitted to the hospital by [name] and presents now for definitive cardiac catheterization and possible percutaneous coronary intervention.   PROCEDURE: Left heart catheterization: Coronary angiography; angiography into the vein graft; selective angiography into the left internal mammary artery graft; left ventriculography; percutaneous coronary intervention involving the the proximal limb of the vein graft supplying the circumflex marginal vessels with PTCA and stenting intervention to the distal limb of the graft do to in-stent restenosis with Angiosculp scoring balloon and noncompliant balloon dilatation.  The patient was brought to the second floor [place] Cardiac cath lab in the postabsorptive state.  He was premedicated with Versed 2 mg and fentanyl 25 mcg.  His right femoral artery was punctured anteriorly and a 5 French sheath was  inserted without difficulty.  Diagnostic cardiac catheterization was performed utilizing 5 French Judkins 4 left and right catheters.  The right catheter was used for selective angiography into the 3 vein grafts.  A LIMA catheter was used for selective angiography into the [place].  A 5 French pigtail catheter was used for left ventriculography.  With the demonstration of significant stenosis in the sequential vein graft, as well as a patent left internal artery graft, but with progressive disease in the LAD beyond the LIMA insertion, I broke scrub and went out and spoke with the patient's wife in detail concerning the catheterization findings after iscussing the options at length with the patient.  Percutaneous cardiac intervention was then performed to the proximal limb of the saphenous vein graft, as well as the distal sequential limb of the saphenous vein graft supplying the marginal vessels.  The 5 French sheath was upgraded to a 6 [place] sheath.  Angiomax bolus plus infusion was administered.  Brilinta 180 mg was administered for antiplatelet therapy.  A P[place]ater wire was able to be advanced down the graft via [place] 4 guide.  After demonstration of therapeutic anticoagulation, a Sprinter 2.0x12 mm balloon was inserted.  Initial dilatation was done at the 95-99% proximal stenosis.  It was felt that for the in-stent restenosis in the sequential limb an Angiosculpt cutting balloon should be utilized.  A 3.0x10 mm Angioculpt balloon was then inserted.  This was able to be advanced into the proximal portion of the stent, but was unable to cross  the distal high-grade in-stent restenosis.  There was significant difficulty with catheter backup.  Ultimately, the entire system was removed, and 6 French left bypass graft guiding catheter was inserted.  This provided somewhat improved back up to backup still remained difficult.  The 2.0x12 mm Sprinter balloon was then inserted over the [place] and was able to dilate  the distal in-stent restenosis.  The  3.0x10 mm Angiosculpt balloon was then reinserted and additional dilatations were made within the entire stented region.   Due to the diffuse moderate disease proximal to the initial proximal stenosis it was felt that this needed to be covered with the proximal stent.  A Resolute Integrity 3.0x26 mm DES stent was then inserted proximally covering the entire region of proximal stenosis.  This was dilated x2 up to 14 atmospheres.  A 3.25x15 mm Wallace Trek balloon was then used for post stent dilatation.  There was still an area that did not fully expand at the site of the original very high-grade stenosis.  The Ajo Trek was  advanced to the  distal sequential limb for noncompliant balloon dilatation within the entire stented sequential limb.  Angiography still confirmed significant distal in-stent narrowing.  A 3.5x8 mm Bruin Trek was then inserted and higher pressures up to 13 atmospheres focally at the stenosis was performed with successful benefit.  This 3.25x8 mm balloon was then positioned in the proximal stent at the site of residual narrowing with high pressure dilatation at 13 atmospheres.  Scattered angiography confirmed a very good angiographic result.  The patient tolerated the procedure well.  He was chest pain-free.  Left catheterization laboratory with stable hemodynamics.  HEMODYNAMICS:   Central Aorta: 109/50   Left Ventricle: 109/9  ANGIOGRAPHY:  Left main: [place] vessel with 60 to 70% distal stenosis prior to bifurcating into the LAD and the circumflex vessel.  LAD: Moderate size vessel that has 70% ostial stenosis.  The vessel gave rise to several small proximal septal perforating arteries and 2 diagonal.  A flush and fill phenonomen was seen in the mid LAD segment.  There was mild collateralization of the distal RCA via septal collaterals.  Left circumflex: Occluded marginal vessels and small caliber AV groove circumflex after the left atrial branch with  diffuse 90% stenosis.  Right coronary artery: The right coronary artery totally occluded proximally after a proximal branch.  There was bridging collaterals which supply the mid RCA.  There was evidence for thrombus in the mid RCA in this segment after the initial bridging collaterals proximal to a larger collateral which arose from a proximal branch.  The distal RCA ended in a small PDA vessel  LIMA to LAD: patent with 80% stenosis in LAD after anastomosis  SVG to Diagonal: occluded at origin  SVG to RCA: occluded at origin  Sequential SVG to the OM2 and OM 3 vessels with a 95-99% proximal stenosis in the proximal third of the body of the proximal graft with narrowing of 50% prior to this stenosis, 50% eccentric stenosis in the mid portion of the vein graft proximal to the initial anastomosis, and evidence for 95% distal in-stent restenosis in the sequential limb of the graft which had previously placed tandem stents from 2003, and 2013.   Following difficult, but successful percutaneous coronary intervention of the sequential SVG supplying the left circumflex marginal vessels, the region of proximal stenosis, treated with PTCA and DES stenting with a 3.0x26 mm Resolute Integrity DES stent, postdilated to 3.28 mm was reduced to 0%.  There was no change in the mid body 50% stenosis which was not intervened upon.  The in-stent restenosis that was 95% in the sequential limb of the graft treated with Angiosculpt scoring balloon and high pressure noncompliant balloon dilatation was reduced to 0%.  There was TIMI-3 flow.  There is no evidence for dissection.   IMPRESSION:  Significant native coronary artery disease with 60% distal left main stenosis; 70% ostial LAD stenosis; 90% circumflex stenoses with occlusion of the marginal branches; a total proximal RCA occlusion with antegrade bridging collaterals and evidence for thrombus burden in the mid RCA and evidence for faint left to right collaterals to  the distal RCA.  Total occlusion of the vein graft, which had supplied the diagonal vessel  Total occlusion of the vein graft, which had supplied the distal RCA  Sequential vein graft supplying the circumflex marginal vessels with high grade 95-99% proximal stenosis after an area of smooth, 50% narrowing, 50% mid graft stenosis prior to the initial marginal anastomosis and diffuse distal in-stent restenosis in tandem stents in the sequential limb of the graft supplying the distal marginal vessel.  Patent LIMA graft supplying the mid LAD, but with progressive 80% focal stenosis beyond the graft insertion.  Difficult but successful percutaneous coronary intervention involving the proximal limb of the sequential vein graft with PTCA and DES stenting with a 3.0x26 mm Resolute Integrity DES stent, postdilated to 3.28 mm with the >95% stenosis reduced to 0  and Angiosculpt scoring  balloon/PTCA of the sequential limb for instent restenosis reduced from 95% to 0.  RECOMMENDATION:  The patient developed significant thrombus and CAD progression on Plavix.  He was restarted on Brilinta for anti-platelet therapy.  Angiograms will be reviewed with colleagues.  He underwent successful intervention to his remaining sequential vein graft, but may require additional intervention to his LAD via the patent LIMA graft due to aggressive mid LAD stenosis beyond the graft anastomosis of 80%.  Troy Sine, MD, [facility] 06/28/2013 4:08 PM

## 2013-06-28 NOTE — Care Management Note (Addendum)
  Page 2 of 2   06/30/2013     3:06:58 PM CARE MANAGEMENT NOTE 06/30/2013  Patient:  Xavier Giles, Xavier Giles   Account Number:  [id number]  Date Initiated:  06/28/2013  Documentation initiated by:  Xavier Giles  Subjective/Objective Assessment:   Admitted with CP     Action/Plan:   CM to follow for disposition needs   Anticipated DC Date:  06/30/2013   Anticipated DC Plan:  [place]  CM consult  Medication Assistance      Choice offered to / List presented to:             Status of service:  Completed, signed off Medicare Important Message given?  YES (If response is "NO", the following Medicare IM given date fields will be blank) Date Medicare IM given:  06/28/2013 Date Additional Medicare IM given:    Discharge Disposition:  HOME/SELF CARE  Per UR Regulation:  Reviewed for med. necessity/level of care/duration of stay  If discussed at [place] of Stay Meetings, dates discussed:    Comments:  Xavier Jiminez RN, BSN, MSHL, CCM  Nurse - Case Manager, (Unit [phone]  06/29/2013 CM confirmed patient has no medication coverage;  patient using $4.00 med list [facility] Med assist started. Note to MD:  MD_ please sign Rx for application process. CM spoke with Xavier Giles [phone] pager re:  update of no coverage. CM discused option of MD office providing 30 days of samples needed for 1st month during application processing d/t patient has used life time benefit of 1 month free sample 01/2012. CM contacted Xavier Giles, [facility] Representative to confirm no option for another 30 day free option.  Phone call post d/c from patient stating MDs office did not know anything about sample pick up of [place]. CM f/u with Xavier Giles again to confirm office only had 4 day supply and rep will deliver medication to Dr. Ellyn Giles office on Monday around noon. CM contacted patients wife to confirm plan. CM instructed to complete application sent  home asap and [facility] will ship more medication to cover needs until financial aid application  completed. CM faxed partial / incomplete application to [employer] today to jump start process.    Xavier Stachnik RN, BSN, MSHL, CCM  Nurse - Case Manager, (Unit [phone]  06/28/2013 Benefits Check: ticagrelor (BRILINTA) tablet 90 mg Dose: 90 mg Freq: 2 times daily Route: PO Please check coverage, co-pays, deductibles, authorizations, and preferred pharmacy. Thanks,  Initial IM: 4/atatat29/2015 / 8:27:16 AM

## 2013-06-28 NOTE — Interval H&P Note (Signed)
Cath Lab Visit (complete for each Cath Lab visit)  Clinical Evaluation Leading to the Procedure:   ACS: no  Non-ACS:    Anginal Classification: CCS IV  Anti-ischemic medical therapy: Maximal Therapy (2 or more classes of medications)  Non-Invasive Test Results: No non-invasive testing performed  Prior CABG: Previous CABG      History and Physical Interval Note:  06/28/2013 12:29 PM  Xavier Giles  has presented today for surgery, with the diagnosis of cp  The various methods of treatment have been discussed with the patient and family. After consideration of risks, benefits and other options for treatment, the patient has consented to  Procedure(s): LEFT HEART CATHETERIZATION WITH CORONARY/GRAFT ANGIOGRAM (N/A) as a surgical intervention .  The patient's history has been reviewed, patient examined, no change in status, stable for surgery.  I have reviewed the patient's chart and labs.  Questions were answered to the patient's satisfaction.     Troy Sine

## 2013-06-28 NOTE — ED Notes (Signed)
Patient states that chest pain started Monday with exertion and decreased with rest, last night chest pain occurred while at rest. Describes chest pain as similar to previous occurences of angina.  30 minutes in duration.  Some sob, no nausea, diaphoresis, Some cough and congestion, denies fever, vomiting.  States it has been a Riv[place] time since he has had a PCP.   Sees [name].  Cardiology to see patient. Patient has history of CABG, Denies taking NTG, ASA today.

## 2013-06-28 NOTE — Progress Notes (Signed)
ANTICOAGULATION CONSULT NOTE - Initial Consult  Pharmacy Consult for heparin Indication: chest pain/ACS  Allergies  Allergen Reactions  . Keflex [Cephalexin] Other (See Comments)    Unknown   . Lipitor [Atorvastatin] Other (See Comments)    Elevated blood sugar  . Sulfa Antibiotics Nausea Only  . Zocor [Simvastatin] Other (See Comments)    Unknown     Patient Measurements: Weight: 185 lb (83.915 kg)   Vital Signs: Temp: 97.5 F (36.4 C) (04/29 1633) Temp src: Oral (04/29 1633) BP: 143/66 mmHg (04/29 1900) Pulse Rate: 59 (04/29 1900)  Labs:  Recent Labs  06/28/13 0820 06/28/13 0830 06/28/13 0838 06/28/13 1101  HGB 14.7  --  14.6  --   HCT 42.1  --  43.0  --   PLT 173  --   --   --   LABPROT  --  13.9  --   --   INR  --  1.09  --   --   CREATININE  --   --  1.20 1.05  TROPONINI  --   --   --  <0.30    The CrCl is unknown because both a height and weight (above a minimum accepted value) are required for this calculation.   Medical History: Past Medical History  Diagnosis Date  . CAD in native artery 1998    Multivessel disease referred for CABG  . S/P CABG x 3 1990    LIMA-LAD, SVG-OM2-OM 3, SVG to PDA, SVG-D1  . CAD (coronary artery disease) of bypass graft 2003    Positive stress test: 100% occlusion SVG-PDA, 80% in D1 through SVG, 80% SVG-OM, patent LIMA-LAD; PCI SVG-OM (3.0 x 8 Zeta stent - going into native OM) and native D1 - PTCA only  . History of: Non-STEMI (non-ST elevated myocardial infarction) 01/13/2012    SVG-diagonal occluded, 50% distal left main, 70-80% proximal LAD and 90% mid.  Patent LIMA with 70% lesion post anastomosis.  SVG to RCA occluded, SVG-OM2-3 90% sequential limb stenosis -- PCI SVG-OM 2 -OM 3: Xience Xpedition DES 2.75 MM x15 MM (3 MM)  . Presence of drug coated stent in left circumflex coronary artery     Xience expedition DES -- SVG-OM 2 -OM 3.;  Previously placed BMS into first anastomosis.  [name] HTN (hypertension) 01/13/2012  .  History of nuclear stress test 01/2011    infarct v. scar with with fixed inferoseptal defecrt  . Carotid bruit     asymptomatic; carotid doppler 01/2012 - R bulb/prox ICA mild-mod amt of fibrous plaque, L subclavian 70-99% diameter reduction, L bulb/ICA mild amt of fibrous plaque  . Hypertension   . Dyslipidemia, goal LDL below 70   . Arthritis     "hands" (06/28/2013)  . Chronic lower back pain       Assessment: 72yom with Hx CAD/CABG admitted with CP.  He as was taken to cath lab and found to have occluded grafts.  DES to LCx graft and possibly need more PCI.  Plan to start heparin 8hr after sheath out of groi.  Sheath pulled by RN at 1730 - start heparin 06/29/13 at 0130.   Goal of Therapy:  Heparin level 0.3-0.7 units/ml Monitor platelets by anticoagulation protocol: Yes   Plan:  Start heparin drip 800 uts/hr at 0130 06/29/13 Draw heparin level, CBC 6hr after start and daily  Bonnita Nasuti Pharm.D. CPP, BCPS Clinical Pharmacist [phone] 06/28/2013 7:32 PM

## 2013-06-28 NOTE — ED Notes (Signed)
Pt brought to room via wheelchair with family in tow; pt instructed to undress and get into a gown; RN and NT in room

## 2013-06-28 NOTE — H&P (Signed)
History and Physical  Patient ID: Xavier Giles MRN: [id number], SOB: 07/14/1940 73 y.o. Date of Encounter: 06/28/2013, 10:13 AM  Primary Physician: Wyatt Haste, MD Primary Cardiologist: Dr Ellyn Hack  Chief Complaint: Chest pain  HPI: 73 y.o. male w/ PMHx significant for CAD s/p CABG and PCI who presented to [facility] on 06/28/2013 with complaints of chest pain. The patient initially underwent multivessel CABG in 1998. He was treated with a LIMA to LAD, sequential saphenous vein graft to OM 2 and OM 3, saphenous vein graft to PDA, and saphenous vein graft to first diagonal. His last heart catheterization was in November 2013 when he presented with non-ST elevation infarction. At that time the vein graft to diagonal and vein graft to RCA were both occluded. The sequential vein graft to OM 2 and OM 3 had a 90% stenosis in the sequence limb and this was treated with a 2.75 x 15 mm Xience Xpedition DES.  There was moderate stenosis documented beyond the LIMA insertion site in the LAD and medical therapy was recommended.  The patient has been clinically stable until approximately one week ago. He was recently seen by [name] and was doing well on his medical program. Aspirin was stopped about one month ago and he has continued on clopidogrel. One week ago he developed left-sided chest pressure with exertion. The patient is physically active and he walks every day for about one hour. He has experienced progressive left-sided chest discomfort associated with activity over the past week. Yesterday this occurred within the first 15 minutes of his walk. His pain did not resolve until about 30-40 minutes of rest. Last night he awoke with mild chest discomfort in the left chest at rest. Because of progressive symptoms, he presents to the emergency department today. He is chest pain-free on my evaluation.  The patient denies shortness of breath, lightheadedness, nausea, diaphoresis,  palpitations, or syncope. He's had no recent illness. He specifically denies any flulike symptoms, headache, bowel or bladder problems, or joint aches. He otherwise has been in his normal state of health. He is compliant with his medications. He does not smoke cigarettes. At the time of his last PCI procedure, he was started on brilinta but the cost was prohibitive after 30 days and he switched back to clopidogrel.  Past Medical History  Diagnosis Date  . CAD in native artery 1998    Multivessel disease referred for CABG  . S/P CABG x 3 1990    LIMA-LAD, SVG-OM2-OM 3, SVG to PDA, SVG-D1  . CAD (coronary artery disease) of bypass graft 2003    Positive stress test: 100% occlusion SVG-PDA, 80% in D1 through SVG, 80% SVG-OM, patent LIMA-LAD; PCI SVG-OM (3.0 x 8 Zeta stent - going into native OM) and native D1 - PTCA only  . History of: Non-STEMI (non-ST elevated myocardial infarction) 01/13/2012    SVG-diagonal occluded, 50% distal left main, 70-80% proximal LAD and 90% mid.  Patent LIMA with 70% lesion post anastomosis.  SVG to RCA occluded, SVG-OM2-3 90% sequential limb stenosis -- PCI SVG-OM 2 -OM 3: Xience Xpedition DES 2.75 MM x15 MM (3 MM)  . Presence of drug coated stent in left circumflex coronary artery     Xience expedition DES -- SVG-OM 2 -OM 3.;  Previously placed BMS into first anastomosis.  [name] HTN (hypertension) 01/13/2012  . History of nuclear stress test 01/2011    infarct v. scar with with fixed inferoseptal defecrt  . Carotid bruit  asymptomatic; carotid doppler 01/2012 - R bulb/prox ICA mild-mod amt of fibrous plaque, L subclavian 70-99% diameter reduction, L bulb/ICA mild amt of fibrous plaque  . Hypertension   . Dyslipidemia, goal LDL below 70      Surgical History:  Past Surgical History  Procedure Laterality Date  . Coronary artery bypass graft  1998    SVG-diagonal, DVG-OM, DVG-PDA, LIMA-LAD  . Coronary angioplasty  2003    BMS 3.0 mm x 8 mm stent to SVG to OM &  cuttin balloon to diagonal; 100% occluded vein graft to PDA, patent LIMA; 80% stenosis in SVG-diagnal; vein graft stenosis in OM  . Coronary angioplasty with stent placement  01/2012    PCI & stenting of SVG-OM2 to OM3 segment with Xience Xpedition 2.22mmx15mm DES     Home Meds: Prior to Admission medications   Medication Sig Start Date End Date Taking? Authorizing Provider  amLODipine-benazepril (LOTREL) 10-20 MG per capsule Take 1 capsule by mouth daily. 10/07/12  Yes [name], MD  aspirin EC 81 MG tablet Take 81 mg by mouth daily.   Yes Historical Provider, MD  atenolol (TENORMIN) 50 MG tablet TAKE ONE TABLET BY MOUTH EVERY DAY 04/05/13  Yes [name], MD  clopidogrel (PLAVIX) 75 MG tablet TAKE ONE TABLET BY MOUTH EVERY DAY 02/17/13  Yes [name], MD  hydrochlorothiazide (MICROZIDE) 12.5 MG capsule TAKE ONE CAPSULE BY MOUTH ONCE DAILY 04/26/13  Yes [name], MD  nitroGLYCERIN (NITROSTAT) 0.4 MG SL tablet Place 1 tablet (0.4 mg total) under the tongue every 5 (five) minutes x 3 doses as needed for chest pain. 01/15/12  Yes Tarri Fuller, [name]  pravastatin (PRAVACHOL) 40 MG tablet Take 1 tablet (40 mg total) by mouth daily. 12/09/12  Yes [name], MD    Allergies:  Allergies  Allergen Reactions  . Keflex [Cephalexin]   . Lipitor [Atorvastatin] Other (See Comments)    Elevated blood sugar  . Sulfa Antibiotics Nausea Only  . Zocor [Simvastatin]     History   Social History  . Marital Status: Married    Spouse Name: N/A    Number of Children: N/A  . Years of Education: N/A   Occupational History  . Not on file.   Social History Main Topics  . Smoking status: Former Smoker    Quit date: 03/02/1990  . Smokeless tobacco: Never Used  . Alcohol Use: No     Comment: occ, 10/18/12-no alcohol use  . Drug Use: No  . Sexual Activity: Not on file   Other Topics Concern  . Not on file   Social History Narrative   Retired Married father of 2, grandfather  tube.  Exercises routinely walking a roughly an hour a day.  Does not smoke does not drink.  He quit smoking many years ago.     Family History  Problem Relation Age of Onset  . Cancer Mother   . Heart attack Father     Review of Systems: General: negative for chills, fever, night sweats or weight changes.  ENT: negative for rhinorrhea or epistaxis Cardiovascular: see HPI Dermatological: negative for rash Respiratory: negative for cough or wheezing GI: negative for nausea, vomiting, diarrhea, bright red blood per rectum, melena, or hematemesis GU: no hematuria, urgency, or frequency Neurologic: negative for visual changes, syncope, headache, or dizziness Heme: no easy bruising or bleeding Endo: negative for excessive thirst, thyroid disorder, or flushing Musculoskeletal: negative for joint pain or swelling, negative for myalgias  All other systems reviewed and are otherwise negative except as noted above.  Physical Exam: Blood pressure 152/68, pulse 64, temperature 97.8 F (36.6 C), temperature source Oral, resp. rate 15, weight 83.915 kg (185 lb), SpO2 98.00%. General: Well developed, well nourished, alert and oriented, in no acute distress. HEENT: Normocephalic, atraumatic, sclera non-icteric, no xanthomas, nares are without discharge.  Neck: Supple. Carotids 2+ without bruits. JVP normal Lungs: Clear bilaterally to auscultation without wheezes, rales, or rhonchi. Breathing is unlabored. Heart: RRR with normal S1 and S2. No murmurs, rubs, or gallops appreciated. Abdomen: Soft, non-tender, non-distended with normoactive bowel sounds. No hepatomegaly. No rebound/guarding. No obvious abdominal masses. Back: No CVA tenderness Msk:  Strength and tone appear normal for age. Extremities: No clubbing, cyanosis, or edema.  Distal pedal pulses are 2+ and equal bilaterally. Neuro: CNII-XII intact, moves all extremities spontaneously. Psych:  Responds to questions appropriately with a normal  affect.   Labs:   Lab Results  Component Value Date   WBC 5.0 06/28/2013   HGB 14.6 06/28/2013   HCT 43.0 06/28/2013   MCV 83.7 06/28/2013   PLT 173 06/28/2013    Recent Labs Lab 06/28/13 0838  NA 137  K 3.9  CL 107  BUN 16  CREATININE 1.20  GLUCOSE 158*   No results found for this basename: CKTOTAL, CKMB, TROPONINI,  in the last 72 hours Lab Results  Component Value Date   CHOL 130 10/24/2012   HDL 38* 10/24/2012   LDLCALC 75 10/24/2012   TRIG 86 10/24/2012   No results found for this basename: DDIMER    Radiology/Studies:  Dg Chest 2 View  06/28/2013   CLINICAL DATA:  Chest pain.  EXAM: CHEST  2 VIEW  COMPARISON:  DG CHEST 2 VIEW dated 01/13/2012; DG CHEST 2 VIEW dated 07/27/2007  FINDINGS: Mediastinum hilar structures normal. Question pulmonary nodule right and left lung base, and right upper lobe. Chest CT suggested for further evaluate. Lungs clear of acute infiltrates. Prior CABG. Cardiomegaly. Normal pulmonary vascularity. No pleural effusion or pneumothorax. Degenerative changes thoracic spine.  IMPRESSION: 1. Questionable new bilateral pulmonary nodules, chest CT suggested for further evaluation. 2. Prior CABG.  Cardiomegaly, no CHF.   Electronically Signed   By: Marcello Moores  Register   On: 06/28/2013 08:54     EKG: NSR with nonspecific IVCD, possible anteroseptal infarct age-undetermined  ASSESSMENT AND PLAN:  1. unstable angina pectoris. The patient's symptoms are highly suggestive of crescendo angina, now CCS class IV with rest pain overnight. His initial cardiac markers are normal. With his classic history of exertional chest pain progressing to rest pain, I feel cardiac catheterization and PCI are indicated. I have reviewed the specific risks, including but not limited to stroke, vascular injury, bleeding, infection, myocardial infarction, arrhythmia, and death. The patient understands these risks occur at low frequency and agrees to proceed. The case will be scheduled with  Dr. Claiborne Billings later this morning. The patient has been on chronic Plavix. He was given aspirin and 324 mg this morning. I will not start IV heparin since he is going to the Cath Lab likely within the hour. Since he has had recurrent ACS presentations, will check P2Y12 testing to evaluate for plavix hyporesponsiveness. All other risk factors appear to be well-controlled.   2. HTN - BP controlled on atenolol and HCTZ  3. Hyperlipidemia. Last lipids: Lipid Panel     Component Value Date/Time   CHOL 130 10/24/2012 0723   TRIG 86 10/24/2012 0723   HDL  38* 10/24/2012 0723   CHOLHDL 3.4 10/24/2012 0723   VLDL 17 10/24/2012 0723   LDLCALC 75 10/24/2012 0723   4. Pulmonary nodule - seen on CXR. Pt has undergone noncontrasted chest CT with result currently pending.  Deatra James  06/28/2013, 10:13 AM

## 2013-06-28 NOTE — ED Notes (Signed)
Patient transported to X-ray 

## 2013-06-29 ENCOUNTER — Encounter (HOSPITAL_COMMUNITY): Payer: Self-pay

## 2013-06-29 ENCOUNTER — Encounter (HOSPITAL_COMMUNITY)
Admission: EM | Disposition: A | Discharge status: Home / Self Care | Payer: Self-pay | Source: Home / Self Care | Attending: Cardiology

## 2013-06-29 DIAGNOSIS — I2510 Atherosclerotic heart disease of native coronary artery without angina pectoris: Secondary | ICD-10-CM | POA: Diagnosis not present

## 2013-06-29 DIAGNOSIS — Z951 Presence of aortocoronary bypass graft: Secondary | ICD-10-CM

## 2013-06-29 DIAGNOSIS — I200 Unstable angina: Secondary | ICD-10-CM | POA: Diagnosis not present

## 2013-06-29 DIAGNOSIS — Z48812 Encounter for surgical aftercare following surgery on the circulatory system: Secondary | ICD-10-CM

## 2013-06-29 DIAGNOSIS — I2582 Chronic total occlusion of coronary artery: Secondary | ICD-10-CM | POA: Diagnosis not present

## 2013-06-29 DIAGNOSIS — K920 Hematemesis: Secondary | ICD-10-CM | POA: Diagnosis not present

## 2013-06-29 DIAGNOSIS — T82897A Other specified complication of cardiac prosthetic devices, implants and grafts, initial encounter: Secondary | ICD-10-CM | POA: Diagnosis not present

## 2013-06-29 DIAGNOSIS — K449 Diaphragmatic hernia without obstruction or gangrene: Secondary | ICD-10-CM | POA: Diagnosis not present

## 2013-06-29 HISTORY — PX: ESOPHAGOGASTRODUODENOSCOPY: SHX5428

## 2013-06-29 LAB — CBC
HEMATOCRIT: 38.9 % — AB (ref 39.0–52.0)
Hemoglobin: 13.1 g/dL (ref 13.0–17.0)
MCH: 28.3 pg (ref 26.0–34.0)
MCHC: 33.7 g/dL (ref 30.0–36.0)
MCV: 84 fL (ref 78.0–100.0)
PLATELETS: 163 10*3/uL (ref 150–400)
RBC: 4.63 MIL/uL (ref 4.22–5.81)
RDW: 13.5 % (ref 11.5–15.5)
WBC: 8.7 10*3/uL (ref 4.0–10.5)

## 2013-06-29 LAB — LIPID PANEL
CHOL/HDL RATIO: 3.4 ratio
Cholesterol: 112 mg/dL (ref 0–200)
HDL: 33 mg/dL — AB (ref >39)
LDL CALC: 62 mg/dL (ref 0–99)
Triglycerides: 87 mg/dL (ref <150)
VLDL: 17 mg/dL (ref 0–40)

## 2013-06-29 LAB — HEMOGLOBIN A1C
Hgb A1c MFr Bld: 6.4 % — ABNORMAL HIGH (ref <5.7)
Mean Plasma Glucose: 137 mg/dL — ABNORMAL HIGH (ref <117)

## 2013-06-29 LAB — TYPE AND SCREEN
ABO/RH(D): A POS
Antibody Screen: NEGATIVE

## 2013-06-29 LAB — BASIC METABOLIC PANEL
BUN: 20 mg/dL (ref 6–23)
CALCIUM: 9.1 mg/dL (ref 8.4–10.5)
CO2: 24 mEq/L (ref 19–32)
Chloride: 104 mEq/L (ref 96–112)
Creatinine, Ser: 0.95 mg/dL (ref 0.50–1.35)
GFR, EST NON AFRICAN AMERICAN: 80 mL/min — AB (ref >90)
Glucose, Bld: 124 mg/dL — ABNORMAL HIGH (ref 70–99)
Potassium: 3.7 mEq/L (ref 3.7–5.3)
Sodium: 141 mEq/L (ref 137–147)

## 2013-06-29 LAB — HEMOGLOBIN AND HEMATOCRIT, BLOOD
HCT: 37.9 % — ABNORMAL LOW (ref 39.0–52.0)
HCT: 36.1 % — ABNORMAL LOW (ref 39.0–52.0)
HEMOGLOBIN: 12.9 g/dL — AB (ref 13.0–17.0)
Hemoglobin: 12.3 g/dL — ABNORMAL LOW (ref 13.0–17.0)

## 2013-06-29 LAB — ABO/RH: ABO/RH(D): A POS

## 2013-06-29 LAB — HEPARIN LEVEL (UNFRACTIONATED)

## 2013-06-29 SURGERY — EGD (ESOPHAGOGASTRODUODENOSCOPY)
Anesthesia: Moderate Sedation

## 2013-06-29 MED ORDER — PANTOPRAZOLE SODIUM 40 MG IV SOLR
40.0000 mg | INTRAVENOUS | Status: DC
  Filled 2013-06-29 (×2): qty 40

## 2013-06-29 MED ORDER — BUTAMBEN-TETRACAINE-BENZOCAINE 2-2-14 % EX AERO
INHALATION_SPRAY | CUTANEOUS | Status: DC | PRN
  Administered 2013-06-29: 2 via TOPICAL

## 2013-06-29 MED ORDER — MIDAZOLAM HCL 5 MG/ML IJ SOLN
INTRAMUSCULAR | Status: AC
  Filled 2013-06-29: qty 2

## 2013-06-29 MED ORDER — ZOLPIDEM TARTRATE 5 MG PO TABS
5.0000 mg | ORAL_TABLET | Freq: Every evening | ORAL | Status: DC | PRN
  Administered 2013-06-29: 5 mg via ORAL
  Filled 2013-06-29: qty 1

## 2013-06-29 MED ORDER — MIDAZOLAM HCL 10 MG/2ML IJ SOLN
INTRAMUSCULAR | Status: DC | PRN
  Administered 2013-06-29: 2 mg via INTRAVENOUS
  Administered 2013-06-29: 14:00:00 1 mg via INTRAVENOUS

## 2013-06-29 MED ORDER — FENTANYL CITRATE 0.05 MG/ML IJ SOLN
INTRAMUSCULAR | Status: DC | PRN
Start: 2013-06-29 — End: 2013-06-29
  Administered 2013-06-29 (×2): 25 ug via INTRAVENOUS

## 2013-06-29 MED ORDER — FENTANYL CITRATE 0.05 MG/ML IJ SOLN
INTRAMUSCULAR | Status: AC
  Filled 2013-06-29: qty 2

## 2013-06-29 MED ORDER — PANTOPRAZOLE SODIUM 40 MG IV SOLR
40.0000 mg | Freq: Two times a day (BID) | INTRAVENOUS | Status: DC
Start: 2013-06-29 — End: 2013-06-30
  Administered 2013-06-29 (×2): 40 mg via INTRAVENOUS
  Filled 2013-06-29 (×3): qty 40

## 2013-06-29 MED ORDER — SODIUM CHLORIDE 0.9 % IV SOLN
INTRAVENOUS | Status: DC
  Administered 2013-06-29: 07:00:00 via INTRAVENOUS

## 2013-06-29 MED ORDER — TICAGRELOR 90 MG PO TABS
90.0000 mg | ORAL_TABLET | Freq: Two times a day (BID) | ORAL | Status: DC
  Administered 2013-06-29 – 2013-06-30 (×2): 90 mg via ORAL
  Filled 2013-06-29 (×4): qty 1

## 2013-06-29 MED ORDER — SODIUM CHLORIDE 0.9 % IV SOLN: INTRAVENOUS | Status: DC

## 2013-06-29 MED FILL — Sodium Chloride IV Soln 0.9%: INTRAVENOUS | Qty: 50 | Status: AC

## 2013-06-29 NOTE — Progress Notes (Addendum)
I have spoken with Dr. Benson Norway. He has esophagitis. He feels it is okay to use proton pump inhibition and to resume the DAPT with ASA and Brilinta. Many thanks to Dr. Benson Norway.

## 2013-06-29 NOTE — Progress Notes (Signed)
Patient Name: Xavier Giles Date of Encounter: 06/29/2013  Active Problems:   Unstable angina    Patient Profile: 73 yo male w/ hx CABG 1998 admitted w/ chest pain 04/29, cath w/ DES to the proximal SVG to the OM2 and OM 3 and PTCA w/ Angiosculpt balloon to the distal graft limb for ISR.   SUBJECTIVE: Pt feels bad. No chest pain or SOB, but feels weak and nauseated. Vomited x 2, once on days and once overnight. 2nd was hematemesis, 1st time just stomach contents.  OBJECTIVE Filed Vitals:   06/29/13 0200 06/29/13 0300 06/29/13 0400 06/29/13 0500  BP: 114/46 125/54 127/89 103/40  Pulse: 76 76 68 93  Temp:    97.5 F (36.4 C)  TempSrc:    Axillary  Resp:    16  Height:      Weight:      SpO2: 96% 97% 95% 96%    Intake/Output Summary (Last 24 hours) at 06/29/13 0657 Last data filed at 06/28/13 2300  Gross per 24 hour  Intake    618 ml  Output   1400 ml  Net   -782 ml   Filed Weights   06/28/13 0802 06/29/13 0001  Weight: 185 lb (83.915 kg) 181 lb (82.1 kg)    PHYSICAL EXAM General: Well developed, well nourished, male in no acute distress. Head: Normocephalic, atraumatic.  Neck: Supple without bruits, JVD not elevated. Lungs:  Resp regular and unlabored, CTA. Heart: RRR, S1, S2, no S3, S4, or murmur; no rub. Abdomen: Soft, non-tender, non-distended, BS + x 4.  Extremities: No clubbing, cyanosis, no edema. Right groin with bruit (>left), small hematoma Neuro: Alert and oriented X 3. Moves all extremities spontaneously. Psych: Normal affect.  LABS: CBC: Recent Labs  06/28/13 0820  06/28/13 1939 06/28/13 2230 06/29/13 0540  WBC 5.0  --  8.8  --  8.7  NEUTROABS 2.9  --  6.6  --   --   HGB 14.7  < > 14.7 14.2 13.1  HCT 42.1  < > 42.2 41.0 38.9*  MCV 83.7  --  83.6  --  84.0  PLT 173  --  168  --  163  < > = values in this interval not displayed. INR:  Recent Labs  06/28/13 0830  INR 2.02   Basic Metabolic Panel:  Recent Labs  06/28/13 1101  06/28/13 1939 06/29/13 0540  NA 143  --  141  K 4.4  --  3.7  CL 102  --  104  CO2 27  --  24  GLUCOSE 120*  --  124*  BUN 16  --  20  CREATININE 1.05 0.96 0.95  CALCIUM 9.8  --  9.1   Liver Function Tests:  Recent Labs  06/28/13 1101  AST 22  ALT 19  ALKPHOS 62  BILITOT 0.6  PROT 7.4  ALBUMIN 4.5   Cardiac Enzymes:  Recent Labs  06/28/13 1101 06/28/13 1939 06/28/13 2230  TROPONINI <0.30 <0.30 <0.30    Recent Labs  06/28/13 0836  TROPIPOC 0.00   BNP: Pro B Natriuretic peptide (BNP)  Date/Time Value Ref Range Status  06/28/2013 11:01 AM 443.4* 0 - 125 pg/mL Final   Hemoglobin A1C:  Recent Labs  06/28/13 1939  HGBA1C 6.4*   Thyroid Function Tests:  Recent Labs  06/28/13 1101  TSH 2.900   TELE:  SR, PVCs and occ pairs      ECG: SR, 85 bpm, no significant changes  Radiology/Studies: Dg Chest 2 View 06/28/2013   CLINICAL DATA:  Chest pain.  EXAM: CHEST  2 VIEW  COMPARISON:  DG CHEST 2 VIEW dated 01/13/2012; DG CHEST 2 VIEW dated 07/27/2007  FINDINGS: Mediastinum hilar structures normal. Question pulmonary nodule right and left lung base, and right upper lobe. Chest CT suggested for further evaluate. Lungs clear of acute infiltrates. Prior CABG. Cardiomegaly. Normal pulmonary vascularity. No pleural effusion or pneumothorax. Degenerative changes thoracic spine.  IMPRESSION: 1. Questionable new bilateral pulmonary nodules, chest CT suggested for further evaluation. 2. Prior CABG.  Cardiomegaly, no CHF.   Electronically Signed   By: Appleby   On: 06/28/2013 08:54   Ct Chest Wo Contrast 06/28/2013   CLINICAL DATA:  Chest pain. Assess pulmonary nodule seen on chest x-ray.  EXAM: CT CHEST WITHOUT CONTRAST  TECHNIQUE: Multidetector CT imaging of the chest was performed following the standard protocol without IV contrast.  COMPARISON:  Chest x-ray 06/28/2013  FINDINGS: Linear densities within the lingula and right middle lobe likely reflect scarring and  corresponds to some of the density seen on prior chest x-ray. No nodular density is noted in the right upper lobe in the area questioned on chest x-ray.  There is a 5 mm well-circumscribed rounded nodule in the left lower lobe on image 45, nonspecific. 6 mm posterior right lower lobe nodule on image 45. Mild centrilobular emphysema. No pleural effusions.  Prior CABG. Heart is normal size. Aorta is normal caliber. No mediastinal, hilar, or axillary adenopathy. Chest wall soft tissues are unremarkable.  Imaging into the upper abdomen demonstrates left sided hydronephrosis, partially imaged. There is a nonobstructing stone in the midpole measuring 5 mm. Layering gallstones within the gallbladder.  IMPRESSION: Small 5-6 mm pulmonary nodules in the lower lobes. If the patient is at high risk for bronchogenic carcinoma, follow-up chest CT at 6-12 months is recommended. If the patient is at low risk for bronchogenic carcinoma, follow-up chest CT at 12 months is recommended. This recommendation follows the consensus statement: Guidelines for Management of Small Pulmonary Nodules Detected on CT Scans: A Statement from the [employer] as published in Radiology 2005;237:395-400.  Scarring in the lingula and right middle lobe corresponding to some of the densities on chest x-ray.  Left-sided hydronephrosis and nephrolithiasis. Cannot exclude left ureteral stone. Recommend clinical correlation for left flank pain and hematuria.  Cholelithiasis.   Electronically Signed   By: Rolm Baptise M.D.   On: 06/28/2013 10:34     Current Medications:  . amLODipine  5 mg Oral Daily  . aspirin EC  81 mg Oral Daily  . atenolol  50 mg Oral Daily  . benazepril  20 mg Oral Daily  . hydrochlorothiazide  12.5 mg Oral Daily  . isosorbide mononitrate  30 mg Oral Daily  . ondansetron      . pantoprazole (PROTONIX) IV  40 mg Intravenous Q24H  . pravastatin  40 mg Oral q1800  . ticagrelor  90 mg Oral BID   . sodium chloride  Stopped (06/29/13 0500)    ASSESSMENT AND PLAN: Active Problems: 1.  Unstable angina - s/p cath 04/29 w/ results below: "Difficult but successful percutaneous coronary intervention involving the proximal limb of the sequential vein graft with PTCA and DES stenting with a 3.0x26 mm Resolute Integrity DES stent, postdilated to 3.28 mm with the >95% stenosis reduced to 0 and Angiosculpt scoring balloon/PTCA of the sequential limb for instent restenosis reduced from 95% to 0."  "The patient developed significant  thrombus and CAD progression on Plavix. He was restarted on Brilinta for anti-platelet therapy. Angiograms will be reviewed with colleagues. He underwent successful intervention to his remaining sequential vein graft, but may require additional intervention to his LAD via the patent LIMA graft due to aggressive mid LAD stenosis beyond the graft anastomosis of 80%."  2. Hematemesis - H&H slight downward trend, pt still feels bad. Spoke w/ Dr. Cristy Folks to see. Get input from them on continuing [place]ta. Serial H&H. Start Protonix IV BID for now, keep NPO. Since NPO, keep IVF going.  3. HTN - good control on current Rx.   Signed, Lonn Georgia , [name] 6:57 AM 06/29/2013  Addendum: Damaris Schooner w/ GI, hold Brilinta for now.  Lonn Georgia, [name] 06/29/2013 7:36 AM Beeper [phone]

## 2013-06-29 NOTE — Progress Notes (Signed)
CARDIAC REHAB PHASE I   PRE:  Rate/Rhythm: 80 SR    BP: sitting 115/56    SaO2:   MODE:  Ambulation: 500 ft   POST:  Rate/Rhythm: 101 ST with occ PVC    BP: sitting 161/61     SaO2:   Pt sts he feels generally tired, some nausea. Some trouble with communication due to [facility] and pt not wanting to complain. Seems uncomfortable in recliner. Able to walk without difficulty, sts he did not feel worse. Does sts that his nausea increases with a deep breath. Gave diet sheets and discussed borderline DM. Pt did not seem ready to hear education. Will f/u when wife is here and feeling better.  [place], ACSM 06/29/2013 8:36 AM

## 2013-06-29 NOTE — Consult Note (Signed)
Reason for Consult: Hematemesis Referring Physician: Cardiology  Xavier Giles HPI: This is a 73 year old male with a PMH of CAD who was admitted for [place].  He underwent dififcult, but successfully stent placement.  After the procedure he had a severe headache and he vomited several times.  Hematemesis was the results, but his HGB was stable.  As a result of the bleeding no further anticoagulation was provided.  No history of GERD, esophagitis, or PUD.  He does use NSAIDs on occasion, but this is not on a chronic basis.  No prior issues of hematemesis.  GI was consulted for further evaluation.    Past Medical History  Diagnosis Date  . CAD in native artery 1998    Multivessel disease referred for CABG  . S/P CABG x 3 1990    LIMA-LAD, SVG-OM2-OM 3, SVG to PDA, SVG-D1  . CAD (coronary artery disease) of bypass graft 2003    Positive stress test: 100% occlusion SVG-PDA, 80% in D1 through SVG, 80% SVG-OM, patent LIMA-LAD; PCI SVG-OM (3.0 x 8 Zeta stent - going into native OM) and native D1 - PTCA only  . History of: Non-STEMI (non-ST elevated myocardial infarction) 01/13/2012    SVG-diagonal occluded, 50% distal left main, 70-80% proximal LAD and 90% mid.  Patent LIMA with 70% lesion post anastomosis.  SVG to RCA occluded, SVG-OM2-3 90% sequential limb stenosis -- PCI SVG-OM 2 -OM 3: Xience Xpedition DES 2.75 MM x15 MM (3 MM)  . Presence of drug coated stent in left circumflex coronary artery     Xience expedition DES -- SVG-OM 2 -OM 3.;  Previously placed BMS into first anastomosis.  [name] HTN (hypertension) 01/13/2012  . History of nuclear stress test 01/2011    infarct v. scar with with fixed inferoseptal defecrt  . Carotid bruit     asymptomatic; carotid doppler 01/2012 - R bulb/prox ICA mild-mod amt of fibrous plaque, L subclavian 70-99% diameter reduction, L bulb/ICA mild amt of fibrous plaque  . Hypertension   . Dyslipidemia, goal LDL below 70   . Arthritis     "hands" (06/28/2013)  .  Chronic lower back pain     Past Surgical History  Procedure Laterality Date  . Coronary angioplasty with stent placement  2003    BMS 3.0 mm x 8 mm stent to SVG to OM & cuttin balloon to diagonal; 100% occluded vein graft to PDA, patent LIMA; 80% stenosis in SVG-diagnal; vein graft stenosis in OM  . Coronary angioplasty with stent placement  01/2012    PCI & stenting of SVG-OM2 to OM3 segment with Xience Xpedition 2.101mmx15mm DES  . Coronary angioplasty with stent placement  06/28/2013    "1 stent, I think"  . Tonsillectomy    . Cataract extraction w/ intraocular lens  implant, bilateral Bilateral   . Coronary artery bypass graft  1998    SVG-diagonal, DVG-OM, DVG-PDA, LIMA-LAD    Family History  Problem Relation Age of Onset  . Cancer Mother   . Heart attack Father     Social History:  reports that he quit smoking about 23 years ago. His smoking use included Cigarettes. He has a 60 pack-year smoking history. He has never used smokeless tobacco. He reports that he drinks alcohol. He reports that he does not use illicit drugs.  Allergies:  Allergies  Allergen Reactions  . Keflex [Cephalexin] Other (See Comments)    Unknown   . Lipitor [Atorvastatin] Other (See Comments)    Elevated blood  sugar  . Sulfa Antibiotics Nausea Only  . Zocor [Simvastatin] Other (See Comments)    Unknown     Medications:  Scheduled: . amLODipine  5 mg Oral Daily  . aspirin EC  81 mg Oral Daily  . atenolol  50 mg Oral Daily  . benazepril  20 mg Oral Daily  . hydrochlorothiazide  12.5 mg Oral Daily  . isosorbide mononitrate  30 mg Oral Daily  . pantoprazole (PROTONIX) IV  40 mg Intravenous Q12H  . pravastatin  40 mg Oral q1800   Continuous: . sodium chloride 50 mL/hr at 06/29/13 0725  . sodium chloride 20 mL/hr at 06/29/13 1023    Results for orders placed during the hospital encounter of 06/28/13 (from the past 24 hour(s))  TROPONIN I     Status: None   Collection Time    06/28/13  7:39  PM      Result Value Ref Range   Troponin I <0.30  <0.30 ng/mL  CBC WITH DIFFERENTIAL     Status: Abnormal   Collection Time    06/28/13  7:39 PM      Result Value Ref Range   WBC 8.8  4.0 - 10.5 K/uL   RBC 5.05  4.22 - 5.81 MIL/uL   Hemoglobin 14.7  13.0 - 17.0 g/dL   HCT 42.2  39.0 - 52.0 %   MCV 83.6  78.0 - 100.0 fL   MCH 29.1  26.0 - 34.0 pg   MCHC 34.8  30.0 - 36.0 g/dL   RDW 13.3  11.5 - 15.5 %   Platelets 168  150 - 400 K/uL   Neutrophils Relative % 74  43 - 77 %   Neutro Abs 6.6  1.7 - 7.7 K/uL   Lymphocytes Relative 13  12 - 46 %   Lymphs Abs 1.1  0.7 - 4.0 K/uL   Monocytes Relative 12  3 - 12 %   Monocytes Absolute 1.1 (*) 0.1 - 1.0 K/uL   Eosinophils Relative 1  0 - 5 %   Eosinophils Absolute 0.1  0.0 - 0.7 K/uL   Basophils Relative 0  0 - 1 %   Basophils Absolute 0.0  0.0 - 0.1 K/uL  HEMOGLOBIN A1C     Status: Abnormal   Collection Time    06/28/13  7:39 PM      Result Value Ref Range   Hemoglobin A1C 6.4 (*) <5.7 %   Mean Plasma Glucose 137 (*) <117 mg/dL  CREATININE, SERUM     Status: Abnormal   Collection Time    06/28/13  7:39 PM      Result Value Ref Range   Creatinine, Ser 0.96  0.50 - 1.35 mg/dL   GFR calc non Af Amer 80 (*) >90 mL/min   GFR calc Af Amer >90  >90 mL/min  TROPONIN I     Status: None   Collection Time    06/28/13 10:30 PM      Result Value Ref Range   Troponin I <0.30  <0.30 ng/mL  HEMOGLOBIN AND HEMATOCRIT, BLOOD     Status: None   Collection Time    06/28/13 10:30 PM      Result Value Ref Range   Hemoglobin 14.2  13.0 - 17.0 g/dL   HCT 41.0  39.0 - 52.0 %  LIPID PANEL     Status: Abnormal   Collection Time    06/29/13  5:40 AM      Result Value Ref Range  Cholesterol 112  0 - 200 mg/dL   Triglycerides 87  <150 mg/dL   HDL 33 (*) >39 mg/dL   Total CHOL/HDL Ratio 3.4     VLDL 17  0 - 40 mg/dL   LDL Cholesterol 62  0 - 99 mg/dL  CBC     Status: Abnormal   Collection Time    06/29/13  5:40 AM      Result Value Ref Range    WBC 8.7  4.0 - 10.5 K/uL   RBC 4.63  4.22 - 5.81 MIL/uL   Hemoglobin 13.1  13.0 - 17.0 g/dL   HCT 38.9 (*) 39.0 - 52.0 %   MCV 84.0  78.0 - 100.0 fL   MCH 28.3  26.0 - 34.0 pg   MCHC 33.7  30.0 - 36.0 g/dL   RDW 13.5  11.5 - 15.5 %   Platelets 163  150 - 400 K/uL  BASIC METABOLIC PANEL     Status: Abnormal   Collection Time    06/29/13  5:40 AM      Result Value Ref Range   Sodium 141  137 - 147 mEq/L   Potassium 3.7  3.7 - 5.3 mEq/L   Chloride 104  96 - 112 mEq/L   CO2 24  19 - 32 mEq/L   Glucose, Bld 124 (*) 70 - 99 mg/dL   BUN 20  6 - 23 mg/dL   Creatinine, Ser 0.95  0.50 - 1.35 mg/dL   Calcium 9.1  8.4 - 10.5 mg/dL   GFR calc non Af Amer 80 (*) >90 mL/min   GFR calc Af Amer >90  >90 mL/min  TYPE AND SCREEN     Status: None   Collection Time    06/29/13  5:40 AM      Result Value Ref Range   ABO/RH(D) A POS     Antibody Screen NEG     Sample Expiration 07/02/2013    ABO/RH     Status: None   Collection Time    06/29/13  5:40 AM      Result Value Ref Range   ABO/RH(D) A POS    HEPARIN LEVEL (UNFRACTIONATED)     Status: Abnormal   Collection Time    06/29/13  9:00 AM      Result Value Ref Range   Heparin Unfractionated <0.10 (*) 0.30 - 0.70 IU/mL  HEMOGLOBIN AND HEMATOCRIT, BLOOD     Status: Abnormal   Collection Time    06/29/13 12:10 PM      Result Value Ref Range   Hemoglobin 12.9 (*) 13.0 - 17.0 g/dL   HCT 37.9 (*) 39.0 - 52.0 %     Dg Chest 2 View  06/28/2013   CLINICAL DATA:  Chest pain.  EXAM: CHEST  2 VIEW  COMPARISON:  DG CHEST 2 VIEW dated 01/13/2012; DG CHEST 2 VIEW dated 07/27/2007  FINDINGS: Mediastinum hilar structures normal. Question pulmonary nodule right and left lung base, and right upper lobe. Chest CT suggested for further evaluate. Lungs clear of acute infiltrates. Prior CABG. Cardiomegaly. Normal pulmonary vascularity. No pleural effusion or pneumothorax. Degenerative changes thoracic spine.  IMPRESSION: 1. Questionable new bilateral  pulmonary nodules, chest CT suggested for further evaluation. 2. Prior CABG.  Cardiomegaly, no CHF.   Electronically Signed   By: [place]   On: 06/28/2013 08:54   Ct Chest Wo Contrast  06/28/2013   CLINICAL DATA:  Chest pain. Assess pulmonary nodule seen on chest x-ray.  EXAM:  CT CHEST WITHOUT CONTRAST  TECHNIQUE: Multidetector CT imaging of the chest was performed following the standard protocol without IV contrast.  COMPARISON:  Chest x-ray 06/28/2013  FINDINGS: Linear densities within the lingula and right middle lobe likely reflect scarring and corresponds to some of the density seen on prior chest x-ray. No nodular density is noted in the right upper lobe in the area questioned on chest x-ray.  There is a 5 mm well-circumscribed rounded nodule in the left lower lobe on image 45, nonspecific. 6 mm posterior right lower lobe nodule on image 45. Mild centrilobular emphysema. No pleural effusions.  Prior CABG. Heart is normal size. Aorta is normal caliber. No mediastinal, hilar, or axillary adenopathy. Chest wall soft tissues are unremarkable.  Imaging into the upper abdomen demonstrates left sided hydronephrosis, partially imaged. There is a nonobstructing stone in the midpole measuring 5 mm. Layering gallstones within the gallbladder.  IMPRESSION: Small 5-6 mm pulmonary nodules in the lower lobes. If the patient is at high risk for bronchogenic carcinoma, follow-up chest CT at 6-12 months is recommended. If the patient is at low risk for bronchogenic carcinoma, follow-up chest CT at 12 months is recommended. This recommendation follows the consensus statement: Guidelines for Management of Small Pulmonary Nodules Detected on CT Scans: A Statement from the [employer] as published in Radiology 2005;237:395-400.  Scarring in the lingula and right middle lobe corresponding to some of the densities on chest x-ray.  Left-sided hydronephrosis and nephrolithiasis. Cannot exclude left ureteral stone.  Recommend clinical correlation for left flank pain and hematuria.  Cholelithiasis.   Electronically Signed   By: Rolm Baptise M.D.   On: 06/28/2013 10:34    ROS:  As stated above in the HPI otherwise negative.  Blood pressure 170/77, pulse 78, temperature 98.2 F (36.8 C), temperature source Oral, resp. rate 14, height 5\' 11"  (1.803 m), weight 181 lb (82.1 kg), SpO2 99.00%.    PE: Gen: NAD, Alert and Oriented HEENT:  [place]/AT, EOMI Neck: Supple, no LAD Lungs: CTA Bilaterally CV: RRR without M/G/R ABM: Soft, NTND, +BS Ext: No C/C/E  Assessment/Plan: 1) Hematemesis. 2) CAD s/p stent placement.   Cardiology is concerned about his current cardiac status, i.e., not being on any anticoagulation.  There is an urgency with performing his procedure as he is at higher risk of restenosis.  I will perform an EGD at this time.  Plan: 1) EGD now.  Beryle Beams 06/29/2013, 1:26 PM

## 2013-06-29 NOTE — Progress Notes (Addendum)
ANTICOAGULATION CONSULT NOTE - Follow Up Consult  Pharmacy Consult for heparin  Indication: chest pain/ACS  Allergies  Allergen Reactions  . Keflex [Cephalexin] Other (See Comments)    Unknown   . Lipitor [Atorvastatin] Other (See Comments)    Elevated blood sugar  . Sulfa Antibiotics Nausea Only  . Zocor [Simvastatin] Other (See Comments)    Unknown     Patient Measurements: Height: 5\' 11"  (180.3 cm) Weight: 181 lb (82.1 kg) IBW/kg (Calculated) : 75.3 Heparin Dosing Weight: 82 kg  Vital Signs: Temp: 97.3 F (36.3 C) (04/30 0802) Temp src: Oral (04/30 0802) BP: 113/62 mmHg (04/30 1000) Pulse Rate: 84 (04/30 0802)  Labs:  Recent Labs  06/28/13 0820 06/28/13 0830  06/28/13 1101 06/28/13 1939 06/28/13 2230 06/29/13 0540 06/29/13 0900  HGB 14.7  --   < >  --  14.7 14.2 13.1  --   HCT 42.1  --   < >  --  42.2 41.0 38.9*  --   PLT 173  --   --   --  168  --  163  --   LABPROT  --  13.9  --   --   --   --   --   --   INR  --  1.09  --   --   --   --   --   --   HEPARINUNFRC  --   --   --   --   --   --   --  <0.10*  CREATININE  --   --   < > 1.05 0.96  --  0.95  --   TROPONINI  --   --   --  <0.30 <0.30 <0.30  --   --   < > = values in this interval not displayed.  Estimated Creatinine Clearance: 72.7 ml/min (by C-G formula based on Cr of 0.95).   Medications:  Scheduled:  . amLODipine  5 mg Oral Daily  . aspirin EC  81 mg Oral Daily  . atenolol  50 mg Oral Daily  . benazepril  20 mg Oral Daily  . hydrochlorothiazide  12.5 mg Oral Daily  . isosorbide mononitrate  30 mg Oral Daily  . pantoprazole (PROTONIX) IV  40 mg Intravenous Q12H  . pravastatin  40 mg Oral q1800   Infusions:  . sodium chloride 50 mL/hr at 06/29/13 0725  . sodium chloride 20 mL/hr at 06/29/13 1023    Assessment: 72yom with Hx CAD/CABG admitted with CP is currently on subtherapeutic heparin.  Heparin level is <0.1.  Heparin was actually discontinued due to possible GI bleed  Goal of  Therapy:  Heparin level 0.3-0.7 units/ml Monitor platelets by anticoagulation protocol: Yes   Plan:  1) pharmacy will sign off  Tsz-Yin Marlos Carmen 06/29/2013,11:23 AM

## 2013-06-29 NOTE — Op Note (Signed)
[facility] [place], [id number]   OPERATIVE PROCEDURE REPORT  PATIENT: Xavier Giles, Xavier Giles  MR#: [id number] BIRTHDATE: May 13, 1940  GENDER: Male ENDOSCOPIST: Carol Ada, MD ASSISTANT:   William Dalton, technician, Cleda Daub, RN CGRN, and Carolynn Comment, technician PROCEDURE DATE: 06/29/2013 PROCEDURE:   EGD, diagnostic ASA CLASS:   Class III INDICATIONS: Hematemesis MEDICATIONS: Versed 3 mg IV and Fentanyl 50 mcg IV TOPICAL ANESTHETIC:   Cetacaine Spray  DESCRIPTION OF PROCEDURE:   After the risks benefits and alternatives of the procedure were thoroughly explained, informed consent was obtained.  The Pentax Gastroscope [id number]  endoscope was introduced through the mouth  and advanced to the second portion of the duodenum Without limitations.      The instrument was slowly withdrawn as the mucosa was fully examined.      FINDINGS: In the distal esophagus there was evidence of an LA grade A esophagitis.  There was clear evidence that the bleeding was from this site.  No active bleeding during the endoscopy.  A small 2 cm sliding hiatal hernia was noted.  No findings of ulcerations, erosions, polyps, masses, or vascular abnormalities in the stomach or duodenum.   Retroflexed views revealed no abnormalities.     The scope was then withdrawn from the patient and the procedure terminated.  COMPLICATIONS: There were no complications.  IMPRESSION: 1) LA Grade A esophagitis. 2) 2 cm sliding hiatal hernia.  RECOMMENDATIONS: 1) Continue with Protonix. 2) Okay to start Froid.  He is going to be at risk for some bleeding before this area heals up, but the benefits are greater than the risks at this time.  I discussed the findings with Dr. Tamala Julian.  _______________________________ eSigned:  Carol Ada, MD 06/29/2013 1:48 PM

## 2013-06-29 NOTE — Progress Notes (Signed)
*  PRELIMINARY RESULTS* Vascular Ultrasound Limited right Lower Extremity Arterial Duplex has been completed.   There is no obvious evidence of pseudoaneurysm involving the right femoral artery.  06/29/2013 12:57 PM Maudry Mayhew, RVT, RDCS, RDMS

## 2013-06-29 NOTE — Progress Notes (Signed)
Patient interviewed and examined. Clinical data reviewed. Will speak with Dr. Claiborne Billings about safety of holding Brilinta. No major drop in hemoglobin. Nausea and vomiting started after developing severe head ache. He has no focal neuro deficits and currently, headache has resolved.

## 2013-06-30 ENCOUNTER — Encounter (HOSPITAL_COMMUNITY): Payer: Self-pay | Admitting: Gastroenterology

## 2013-06-30 ENCOUNTER — Other Ambulatory Visit: Payer: Self-pay | Admitting: *Deleted

## 2013-06-30 DIAGNOSIS — I2510 Atherosclerotic heart disease of native coronary artery without angina pectoris: Secondary | ICD-10-CM | POA: Diagnosis not present

## 2013-06-30 DIAGNOSIS — K920 Hematemesis: Secondary | ICD-10-CM | POA: Diagnosis not present

## 2013-06-30 DIAGNOSIS — K449 Diaphragmatic hernia without obstruction or gangrene: Secondary | ICD-10-CM | POA: Diagnosis not present

## 2013-06-30 DIAGNOSIS — I2582 Chronic total occlusion of coronary artery: Secondary | ICD-10-CM | POA: Diagnosis not present

## 2013-06-30 DIAGNOSIS — I200 Unstable angina: Secondary | ICD-10-CM | POA: Diagnosis not present

## 2013-06-30 DIAGNOSIS — T82897A Other specified complication of cardiac prosthetic devices, implants and grafts, initial encounter: Secondary | ICD-10-CM | POA: Diagnosis not present

## 2013-06-30 DIAGNOSIS — Z951 Presence of aortocoronary bypass graft: Secondary | ICD-10-CM | POA: Diagnosis not present

## 2013-06-30 LAB — HEMOGLOBIN AND HEMATOCRIT, BLOOD
HCT: 34.3 % — ABNORMAL LOW (ref 39.0–52.0)
HCT: 35.4 % — ABNORMAL LOW (ref 39.0–52.0)
HEMOGLOBIN: 12.2 g/dL — AB (ref 13.0–17.0)
Hemoglobin: 11.7 g/dL — ABNORMAL LOW (ref 13.0–17.0)

## 2013-06-30 MED ORDER — TICAGRELOR 90 MG PO TABS: 90.0000 mg | ORAL_TABLET | Freq: Two times a day (BID) | ORAL | Status: DC

## 2013-06-30 MED ORDER — ISOSORBIDE MONONITRATE ER 30 MG PO TB24: 30.0000 mg | ORAL_TABLET | Freq: Every day | ORAL | Status: DC

## 2013-06-30 MED ORDER — NITROGLYCERIN 0.4 MG SL SUBL: 0.4000 mg | SUBLINGUAL_TABLET | SUBLINGUAL | Status: DC | PRN

## 2013-06-30 MED ORDER — PANTOPRAZOLE SODIUM 40 MG PO TBEC: 40.0000 mg | DELAYED_RELEASE_TABLET | Freq: Every day | ORAL | Status: DC

## 2013-06-30 NOTE — Progress Notes (Signed)
Discharged reviewed with pt and wife. Both verbally stated understanding of discharge instructions. Patient instructed to to pickup 30 day supply of BRILINTA  @ doctor's office to cover until  assistance card processed

## 2013-06-30 NOTE — Progress Notes (Signed)
CARDIAC REHAB PHASE I   PRE:  Rate/Rhythm:   BP:  Sitting:      SaO2:   MODE:  Ambulation: 1000 ft   POST:  Rate/Rhythm:   BP:  Sitting:     SaO2: 1010-1045 Patient ambulated independently with steady gait noted. Denied complaints. Cardiac monitor discontinued prior to ambulation per primary RN and patient dressed waiting for discharge. Education completed with patient and wife at bedside. Both verbalized understanding and teach back noted. Wife stated waiting on further information from primary RN about resources for [place] after 30 day free supply. Wife and patient stated Pattricia Boss was unaffordable without additional help.  Information about phase II rehab given, but patient adamantly declined stating "I had rather walk for an hour at home on my own time".  Marcelline Temkin English PayneRN, BSN 06/30/2013 10:48 AM

## 2013-06-30 NOTE — Progress Notes (Signed)
Subjective: No acute events.  Feeling well.  Objective: Vital signs in last 24 hours: Temp:  [97.3 F (36.3 C)-98.2 F (36.8 C)] 97.3 F (36.3 C) (05/01 0401) Pulse Rate:  [70-84] 70 (05/01 0401) Resp:  [14-18] 16 (05/01 0401) BP: (113-174)/(50-77) 119/73 mmHg (05/01 0401) SpO2:  [93 %-99 %] 93 % (05/01 0401) Last BM Date: 06/29/13  Intake/Output from previous day: 04/30 0701 - 05/01 0700 In: 719.2 [P.O.:240; I.V.:479.2] Out: -  Intake/Output this shift:    General appearance: alert and no distress GI: soft, non-tender; bowel sounds normal; no masses,  no organomegaly  Lab Results:  Recent Labs  06/28/13 0820  06/28/13 1939  06/29/13 0540  06/29/13 1920 06/30/13 0135 06/30/13 0600  WBC 5.0  --  8.8  --  8.7  --   --   --   --   HGB 14.7  < > 14.7  < > 13.1  < > 12.3* 11.7* 12.2*  HCT 42.1  < > 42.2  < > 38.9*  < > 36.1* 34.3* 35.4*  PLT 173  --  168  --  163  --   --   --   --   < > = values in this interval not displayed. BMET  Recent Labs  06/28/13 0838 06/28/13 1101 06/28/13 1939 06/29/13 0540  NA 137 143  --  141  K 3.9 4.4  --  3.7  CL 107 102  --  104  CO2  --  27  --  24  GLUCOSE 158* 120*  --  124*  BUN 16 16  --  20  CREATININE 1.20 1.05 0.96 0.95  CALCIUM  --  9.8  --  9.1   LFT  Recent Labs  06/28/13 1101  PROT 7.4  ALBUMIN 4.5  AST 22  ALT 19  ALKPHOS 62  BILITOT 0.6   PT/INR  Recent Labs  06/28/13 0830  LABPROT 13.9  INR 1.09   Hepatitis Panel No results found for this basename: HEPBSAG, HCVAB, HEPAIGM, HEPBIGM,  in the last 72 hours C-Diff No results found for this basename: CDIFFTOX,  in the last 72 hours Fecal Lactopherrin No results found for this basename: FECLLACTOFRN,  in the last 72 hours  Studies/Results: Dg Chest 2 View  06/28/2013   CLINICAL DATA:  Chest pain.  EXAM: CHEST  2 VIEW  COMPARISON:  DG CHEST 2 VIEW dated 01/13/2012; DG CHEST 2 VIEW dated 07/27/2007  FINDINGS: Mediastinum hilar structures normal.  Question pulmonary nodule right and left lung base, and right upper lobe. Chest CT suggested for further evaluate. Lungs clear of acute infiltrates. Prior CABG. Cardiomegaly. Normal pulmonary vascularity. No pleural effusion or pneumothorax. Degenerative changes thoracic spine.  IMPRESSION: 1. Questionable new bilateral pulmonary nodules, chest CT suggested for further evaluation. 2. Prior CABG.  Cardiomegaly, no CHF.   Electronically Signed   By: [place]   On: 06/28/2013 08:54   Ct Chest Wo Contrast  06/28/2013   CLINICAL DATA:  Chest pain. Assess pulmonary nodule seen on chest x-ray.  EXAM: CT CHEST WITHOUT CONTRAST  TECHNIQUE: Multidetector CT imaging of the chest was performed following the standard protocol without IV contrast.  COMPARISON:  Chest x-ray 06/28/2013  FINDINGS: Linear densities within the lingula and right middle lobe likely reflect scarring and corresponds to some of the density seen on prior chest x-ray. No nodular density is noted in the right upper lobe in the area questioned on chest x-ray.  There is a 5  mm well-circumscribed rounded nodule in the left lower lobe on image 45, nonspecific. 6 mm posterior right lower lobe nodule on image 45. Mild centrilobular emphysema. No pleural effusions.  Prior CABG. Heart is normal size. Aorta is normal caliber. No mediastinal, hilar, or axillary adenopathy. Chest wall soft tissues are unremarkable.  Imaging into the upper abdomen demonstrates left sided hydronephrosis, partially imaged. There is a nonobstructing stone in the midpole measuring 5 mm. Layering gallstones within the gallbladder.  IMPRESSION: Small 5-6 mm pulmonary nodules in the lower lobes. If the patient is at high risk for bronchogenic carcinoma, follow-up chest CT at 6-12 months is recommended. If the patient is at low risk for bronchogenic carcinoma, follow-up chest CT at 12 months is recommended. This recommendation follows the consensus statement: Guidelines for Management  of Small Pulmonary Nodules Detected on CT Scans: A Statement from the [employer] as published in Radiology 2005;237:395-400.  Scarring in the lingula and right middle lobe corresponding to some of the densities on chest x-ray.  Left-sided hydronephrosis and nephrolithiasis. Cannot exclude left ureteral stone. Recommend clinical correlation for left flank pain and hematuria.  Cholelithiasis.   Electronically Signed   By: Rolm Baptise M.D.   On: 06/28/2013 10:34    Medications:  Scheduled: . amLODipine  5 mg Oral Daily  . aspirin EC  81 mg Oral Daily  . atenolol  50 mg Oral Daily  . benazepril  20 mg Oral Daily  . hydrochlorothiazide  12.5 mg Oral Daily  . isosorbide mononitrate  30 mg Oral Daily  . pantoprazole (PROTONIX) IV  40 mg Intravenous Q12H  . pravastatin  40 mg Oral q1800  . ticagrelor  90 mg Oral BID   Continuous: . sodium chloride 50 mL/hr at 06/29/13 1700    Assessment/Plan: 1) LA Grade A esophagitis. 2) Hiatal hernia. 3) CAD s/p stenting.   Clinically he is stable.  No reports of bleeding.  Plan: 1) PPI indefinitely. 2) Anticoagulation per Cardiology.   LOS: 2 days   Xavier Giles 06/30/2013, 7:16 AM

## 2013-06-30 NOTE — Progress Notes (Addendum)
He had a great night and absent nausea and vomiting. No melena. DAPT was restarted.Plan home today and f/u with [name]. Headache, resolved. Hemoglobin stable. Dr. Benson Norway has approved continued DAPT.

## 2013-06-30 NOTE — Discharge Instructions (Signed)

## 2013-06-30 NOTE — Discharge Summary (Signed)
Discharge Summary   Patient ID: Xavier Giles MRN: [id number], DOB/AGE: [id number] 75 y.o. Admit date: 06/28/2013 D/C date:     06/30/2013  Primary Cardiologist: [name]   Principal Problem:   Unstable angina Active Problems:   CAD (coronary artery disease), CABG in 1998 and stent to VG-OM with cutting balloon to diag in 2003   HTN (hypertension)   S/P CABG x 3   Mixed hyperlipidemia   Admission Dates: 06/28/13-06/30/13 Discharge Diagnosis: Unstable angina s/p DES to prox SVG-->OM2-OM 3 and PTCA w/ Angiosculpt balloon to the distal graft limb for ISR.   HPI: ABSALOM ARO is a 73 y.o. male with a history of HTN, HLD, and CAD s/p CABGx3 1998 and NSTEMI s/p DES to SVG to OM1-OM3 (2013) who was admitted to [facility] on 04/29 with chest pain.   The patient had been clinically stable until approximately one week prior to [place]. He was recently seen by [name] and was doing well on his medical program. Aspirin was stopped about one month ago and he has continued on clopidogrel. One week ago he developed left-sided chest pressure with exertion. The patient is physically active and he walks every day for about one hour. He has experienced progressive left-sided chest discomfort associated with activity until 06/27/13 when he awoke from sleep with mild chest discomfort in the left chest at rest. Because of progressive symptoms, he presented to the emergency department the following day. The patient's symptoms were thought to be highly suggestive of crescendo angina and he was taken for cardiac cath.   Hospital Course: He underwent cardiac catheterization on 06/28/13. There was difficult but successful percutaneous coronary intervention involving the proximal limb of the sequential vein graft with PTCA and DES stenting with a 3.0x26 mm Resolute Integrity DES stent, postdilated to 3.28 mm with the >95% stenosis reduced to 0 and Angiosculpt scoring balloon/PTCA of the sequential limb for instent  restenosis reduced from 95% to 0. The patient developed significant thrombus and CAD progression on Plavix. He was restarted on DAPT with Brilinta as there was question of plavix hyporesponsiveness with recurrent ACS events. He underwent successful intervention to his remaining sequential vein graft, but may require additional intervention to his LAD via the patent LIMA graft due to aggressive mid LAD stenosis beyond the graft anastomosis of 80%. On the night after his intervention he developed coffee ground emesis. Brilinta was initially stopped d/t possible GI bleed. GI was consulted and he is now s/p EGD revealing LA Grade A esophagitis and a 2 cm sliding hiatal hernia. It was recommended to continue with protonix and resume Brilinta/ASA as the benefits are greater than the risks of bleeding from this area. His H/H dropped slightly, but has been trending upwards. HG 13.1--> 12.9--> 12.3-->11.7-->12.2. He has had no further episodes of emesis. GI recommended that he continue a PPI indefinitely. His groin site was negative for for pseudoanyerism by doppler.  The patient has had complicated hospital course but is recovering well. The femoral catheter site is stable. He has been seen by Dr. Tamala Julian today and deemed ready for discharge home. All follow-up appointments have been scheduled. A 30 day free supply of Brilinta was provided for the patient. Discharge medications are listed below.    Discharge Vitals: Blood pressure 134/75, pulse 66, temperature 98.1 F (36.7 C), temperature source Oral, resp. rate 18, height 5\' 11"  (1.803 m), weight 181 lb (82.1 kg), SpO2 94.00%.  Labs: Lab Results  Component Value Date  WBC 8.7 06/29/2013   HGB 12.2* 06/30/2013   HCT 35.4* 06/30/2013   MCV 84.0 06/29/2013   PLT 163 06/29/2013    Recent Labs Lab 06/28/13 1101  06/29/13 0540  NA 143  --  141  K 4.4  --  3.7  CL 102  --  104  CO2 27  --  24  BUN 16  --  20  CREATININE 1.05  < > 0.95  CALCIUM 9.8  --  9.1    PROT 7.4  --   --   BILITOT 0.6  --   --   ALKPHOS 62  --   --   ALT 19  --   --   AST 22  --   --   GLUCOSE 120*  --  124*  < > = values in this interval not displayed.  Recent Labs  06/28/13 1101 06/28/13 1939 06/28/13 2230  TROPONINI <0.30 <0.30 <0.30   Lab Results  Component Value Date   CHOL 112 06/29/2013   HDL 33* 06/29/2013   LDLCALC 62 06/29/2013   TRIG 87 06/29/2013     Diagnostic Studies/Procedures   Dg Chest 2 View  06/28/2013   CLINICAL DATA:  Chest pain.  EXAM: CHEST  2 VIEW  COMPARISON:  DG CHEST 2 VIEW dated 01/13/2012; DG CHEST 2 VIEW dated 07/27/2007  FINDINGS: Mediastinum hilar structures normal. Question pulmonary nodule right and left lung base, and right upper lobe. Chest CT suggested for further evaluate. Lungs clear of acute infiltrates. Prior CABG. Cardiomegaly. Normal pulmonary vascularity. No pleural effusion or pneumothorax. Degenerative changes thoracic spine.  IMPRESSION: 1. Questionable new bilateral pulmonary nodules, chest CT suggested for further evaluation. 2. Prior CABG.  Cardiomegaly, no CHF.   Ct Chest Wo Contrast  06/28/2013   CLINICAL DATA:  Chest pain. Assess pulmonary nodule seen on chest x-ray.  EXAM: CT CHEST WITHOUT CONTRAST  TECHNIQUE: Multidetector CT imaging of the chest was performed following the standard protocol without IV contrast.  COMPARISON:  Chest x-ray 06/28/2013  FINDINGS: Linear densities within the lingula and right middle lobe likely reflect scarring and corresponds to some of the density seen on prior chest x-ray. No nodular density is noted in the right upper lobe in the area questioned on chest x-ray.  There is a 5 mm well-circumscribed rounded nodule in the left lower lobe on image 45, nonspecific. 6 mm posterior right lower lobe nodule on image 45. Mild centrilobular emphysema. No pleural effusions.  Prior CABG. Heart is normal size. Aorta is normal caliber. No mediastinal, hilar, or axillary adenopathy. Chest wall soft  tissues are unremarkable.  Imaging into the upper abdomen demonstrates left sided hydronephrosis, partially imaged. There is a nonobstructing stone in the midpole measuring 5 mm. Layering gallstones within the gallbladder.  IMPRESSION: Small 5-6 mm pulmonary nodules in the lower lobes. If the patient is at high risk for bronchogenic carcinoma, follow-up chest CT at 6-12 months is recommended. If the patient is at low risk for bronchogenic carcinoma, follow-up chest CT at 12 months is recommended. This recommendation follows the consensus statement: Guidelines for Management of Small Pulmonary Nodules Detected on CT Scans: A Statement from the [place] as published in Radiology 2005;237:395-400.  Scarring in the lingula and right middle lobe corresponding to some of the densities on chest x-ray.  Left-sided hydronephrosis and nephrolithiasis. Cannot exclude left ureteral stone. Recommend clinical correlation for left flank pain and hematuria.  Cholelithiasis.     CARDIAC CATHETERIZATION  HISTORY: Mr.  Jessy Oto. Merrow is a 73 year old patient who is a former patient of [name] and now sees [name]. The patient underwent CABG revascularization surgery in 1998 and had a LIMA graft placed to the LAD, sequential vein graft placed to the OM2 and OM 3 vessel, vein graft to a diagonal and vein graft to the distal right coronary artery. Patient had a stent placed in 2003 to the sequential limb and 2013 at last catheterization by [name] had a stent placed in the sequential limb segment after the OM to take off. At that time, the vein graft to the diagonal, and the vein graft to the RCA with were occluded. The LIMA was patent, but there was 70% stenosis beyond the LIMA insertion. The patient had done well recently. He has developed exertional chest pain. He was awakened from sleep. This morning with class IV unstable angina. He was admitted to the hospital by [name] and presents now for definitive  cardiac catheterization and possible percutaneous coronary intervention.  PROCEDURE: Left heart catheterization: Coronary angiography; angiography into the vein graft; selective angiography into the left internal mammary artery graft; left ventriculography; percutaneous coronary intervention involving the the proximal limb of the vein graft supplying the circumflex marginal vessels with PTCA and stenting intervention to the distal limb of the graft do to in-stent restenosis with Angiosculp scoring balloon and noncompliant balloon dilatation.  The patient was brought to the second floor [place] [facility]iac cath lab in the postabsorptive state. He was premedicated with Versed 2 mg and fentanyl 25 mcg. His right femoral artery was punctured anteriorly and a 5 French sheath was inserted without difficulty. Diagnostic cardiac catheterization was performed utilizing 5 French Judkins 4 left and right catheters. The right catheter was used for selective angiography into the 3 vein grafts. A LIMA catheter was used for selective angiography into the [place]. A 5 French pigtail catheter was used for left ventriculography. With the demonstration of significant stenosis in the sequential vein graft, as well as a patent left internal artery graft, but with progressive disease in the LAD beyond the LIMA insertion, I broke scrub and went out and spoke with the patient's wife in detail concerning the catheterization findings after iscussing the options at length with the patient. Percutaneous cardiac intervention was then performed to the proximal limb of the saphenous vein graft, as well as the distal sequential limb of the saphenous vein graft supplying the marginal vessels. The 5 French sheath was upgraded to a 6 [place] sheath. Angiomax bolus plus infusion was administered. Brilinta 180 mg was administered for antiplatelet therapy. A [place] wire was able to be advanced down the graft via [place] 4 guide. After demonstration  of therapeutic anticoagulation, a Sprinter 2.0x12 mm balloon was inserted. Initial dilatation was done at the 95-99% proximal stenosis. It was felt that for the in-stent restenosis in the sequential limb an Angiosculpt cutting balloon should be utilized. A 3.0x10 mm Angioculpt balloon was then inserted. This was able to be advanced into the proximal portion of the stent, but was unable to cross the distal high-grade in-stent restenosis. There was significant difficulty with catheter backup. Ultimately, the entire system was removed, and 6 French left bypass graft guiding catheter was inserted. This provided somewhat improved back up to backup still remained difficult. The 2.0x12 mm Sprinter balloon was then inserted over the [place] and was able to dilate the distal in-stent restenosis. The 3.0x10 mm Angiosculpt balloon was then reinserted and additional dilatations were  made within the entire stented region. Due to the diffuse moderate disease proximal to the initial proximal stenosis it was felt that this needed to be covered with the proximal stent. A Resolute Integrity 3.0x26 mm DES stent was then inserted proximally covering the entire region of proximal stenosis. This was dilated x2 up to 14 atmospheres. A 3.25x15 mm Flasher Trek balloon was then used for post stent dilatation. There was still an area that did not fully expand at the site of the original very high-grade stenosis. The [place] Trek was advanced to the distal sequential limb for noncompliant balloon dilatation within the entire stented sequential limb. Angiography still confirmed significant distal in-stent narrowing. A 3.5x8 mm Dickinson Trek was then inserted and higher pressures up to 13 atmospheres focally at the stenosis was performed with successful benefit. This 3.25x8 mm balloon was then positioned in the proximal stent at the site of residual narrowing with high pressure dilatation at 13 atmospheres. Scattered angiography confirmed a very good  angiographic result. The patient tolerated the procedure well. He was chest pain-free. Left catheterization laboratory with stable hemodynamics.  HEMODYNAMICS:  Central Aorta: 109/50  Left Ventricle: 109/9  ANGIOGRAPHY:  Left main: [place] vessel with 60 to 70% distal stenosis prior to bifurcating into the LAD and the circumflex vessel.  LAD: Moderate size vessel that has 70% ostial stenosis. The vessel gave rise to several small proximal septal perforating arteries and 2 diagonal. A flush and fill phenonomen was seen in the mid LAD segment. There was mild collateralization of the distal RCA via septal collaterals.  Left circumflex: Occluded marginal vessels and small caliber AV groove circumflex after the left atrial branch with diffuse 90% stenosis.  Right coronary artery: The right coronary artery totally occluded proximally after a proximal branch. There was bridging collaterals which supply the mid RCA. There was evidence for thrombus in the mid RCA in this segment after the initial bridging collaterals proximal to a larger collateral which arose from a proximal branch. The distal RCA ended in a small PDA vessel  LIMA to LAD: patent with 80% stenosis in LAD after anastomosis  SVG to Diagonal: occluded at origin  SVG to RCA: occluded at origin  Sequential SVG to the OM2 and OM 3 vessels with a 95-99% proximal stenosis in the proximal third of the body of the proximal graft with narrowing of 50% prior to this stenosis, 50% eccentric stenosis in the mid portion of the vein graft proximal to the initial anastomosis, and evidence for 95% distal in-stent restenosis in the sequential limb of the graft which had previously placed tandem stents from 2003, and 2013.  Following difficult, but successful percutaneous coronary intervention of the sequential SVG supplying the left circumflex marginal vessels, the region of proximal stenosis, treated with PTCA and DES stenting with a 3.0x26 mm Resolute Integrity DES  stent, postdilated to 3.28 mm was reduced to 0%. There was no change in the mid body 50% stenosis which was not intervened upon. The in-stent restenosis that was 95% in the sequential limb of the graft treated with Angiosculpt scoring balloon and high pressure noncompliant balloon dilatation was reduced to 0%. There was TIMI-3 flow. There is no evidence for dissection.  IMPRESSION:  Significant native coronary artery disease with 60% distal left main stenosis; 70% ostial LAD stenosis; 90% circumflex stenoses with occlusion of the marginal branches; a total proximal RCA occlusion with antegrade bridging collaterals and evidence for thrombus burden in the mid RCA and evidence for faint left  to right collaterals to the distal RCA.  Total occlusion of the vein graft, which had supplied the diagonal vessel  Total occlusion of the vein graft, which had supplied the distal RCA  Sequential vein graft supplying the circumflex marginal vessels with high grade 95-99% proximal stenosis after an area of smooth, 50% narrowing, 50% mid graft stenosis prior to the initial marginal anastomosis and diffuse distal in-stent restenosis in tandem stents in the sequential limb of the graft supplying the distal marginal vessel.  Patent LIMA graft supplying the mid LAD, but with progressive 80% focal stenosis beyond the graft insertion.  Difficult but successful percutaneous coronary intervention involving the proximal limb of the sequential vein graft with PTCA and DES stenting with a 3.0x26 mm Resolute Integrity DES stent, postdilated to 3.28 mm with the >95% stenosis reduced to 0 and Angiosculpt scoring balloon/PTCA of the sequential limb for instent restenosis reduced from 95% to 0.  RECOMMENDATION:  The patient developed significant thrombus and CAD progression on Plavix. He was restarted on Brilinta for anti-platelet therapy. Angiograms will be reviewed with colleagues. He underwent successful intervention to his remaining  sequential vein graft, but may require additional intervention to his LAD via the patent LIMA graft due to aggressive mid LAD stenosis beyond the graft anastomosis of 80%.       Discharge Medications     Medication List    STOP taking these medications       clopidogrel 75 MG tablet  Commonly known as:  PLAVIX      TAKE these medications       amLODipine-benazepril 10-20 MG per capsule  Commonly known as:  LOTREL  Take 1 capsule by mouth daily.     aspirin EC 81 MG tablet  Take 81 mg by mouth daily.     atenolol 50 MG tablet  Commonly known as:  TENORMIN  TAKE ONE TABLET BY MOUTH EVERY DAY     hydrochlorothiazide 12.5 MG capsule  Commonly known as:  MICROZIDE  TAKE ONE CAPSULE BY MOUTH ONCE DAILY     isosorbide mononitrate 30 MG 24 hr tablet  Commonly known as:  IMDUR  Take 1 tablet (30 mg total) by mouth daily.     nitroGLYCERIN 0.4 MG SL tablet  Commonly known as:  NITROSTAT  Place 1 tablet (0.4 mg total) under the tongue every 5 (five) minutes as needed (up to 3 doses).     pantoprazole 40 MG tablet  Commonly known as:  PROTONIX  Take 1 tablet (40 mg total) by mouth daily.     pravastatin 40 MG tablet  Commonly known as:  PRAVACHOL  Take 1 tablet (40 mg total) by mouth daily.     ticagrelor 90 MG Tabs tablet  Commonly known as:  BRILINTA  Take 1 tablet (90 mg total) by mouth 2 (two) times daily.        Disposition   The patient will be discharged in stable condition to home.  Future Appointments Provider Department Dept Phone   07/07/2013 8:30 AM Brittainy Delmer [facility] [phone]     Follow-up Information   Follow up with Robbie Lis, [name] On 07/07/2013. (@8 :30 am)    Specialty:  Cardiology   Contact information:   [address] [place] [address] [phone]         Duration of Discharge Encounter: Greater than 30 minutes including physician and PA time.  Signed, Thereasa Parkin  [name] 06/30/2013, 2:55 PM

## 2013-06-30 NOTE — Telephone Encounter (Signed)
Walk-In Message: "Samples Brilinta"  Per [name], she will give pt's wife one bottle of Brilinta given to her by Erasmo Downer (PharmD)) to give to wife.  Lot: [id number]

## 2013-06-30 NOTE — Progress Notes (Signed)
Patient Name: Xavier Giles Date of Encounter: 06/30/2013     Active Problems:   Unstable angina    SUBJECTIVE  Ready to go home. No more vomiting. No CP or SOB. No pain at groin site. Feeling well.   CURRENT MEDS . amLODipine  5 mg Oral Daily  . aspirin EC  81 mg Oral Daily  . atenolol  50 mg Oral Daily  . benazepril  20 mg Oral Daily  . hydrochlorothiazide  12.5 mg Oral Daily  . isosorbide mononitrate  30 mg Oral Daily  . pantoprazole (PROTONIX) IV  40 mg Intravenous Q12H  . pravastatin  40 mg Oral q1800  . ticagrelor  90 mg Oral BID    OBJECTIVE  Filed Vitals:   06/29/13 1636 06/29/13 2034 06/29/13 2345 06/30/13 0401  BP: 129/57 142/54 119/50 119/73  Giles: 74 77 70 70  Temp: 97.8 F (36.6 C) 98.2 F (36.8 C) 98.2 F (36.8 C) 97.3 F (36.3 C)  TempSrc: Oral Oral Oral Oral  Resp: 16 16 16 16   Height:      Weight:      SpO2: 97% 96% 94% 93%    Intake/Output Summary (Last 24 hours) at 06/30/13 0630 Last data filed at 06/29/13 1833  Gross per 24 hour  Intake 719.17 ml  Output      0 ml  Net 719.17 ml   Filed Weights   06/28/13 0802 06/29/13 0001  Weight: 185 lb (83.915 kg) 181 lb (82.1 kg)    PHYSICAL EXAM  General: Pleasant, NAD. Neuro: Alert and oriented X 3. Moves all extremities spontaneously. Psych: Normal affect. HEENT:  Normal  Neck: Supple without bruits or JVD. Lungs:  Resp regular and unlabored, CTA. Heart: RRR no s3, s4, or murmurs. Abdomen: Soft, non-tender, non-distended, BS + x 4.  Extremities: No clubbing, cyanosis, no edema. Right groin stable- no pain    Accessory Clinical Findings  CBC  Recent Labs  06/28/13 0820  06/28/13 1939  06/29/13 0540  06/29/13 1920 06/30/13 0135  WBC 5.0  --  8.8  --  8.7  --   --   --   NEUTROABS 2.9  --  6.6  --   --   --   --   --   HGB 14.7  < > 14.7  < > 13.1  < > 12.3* 11.7*  HCT 42.1  < > 42.2  < > 38.9*  < > 36.1* 34.3*  MCV 83.7  --  83.6  --  84.0  --   --   --   PLT 173  --   168  --  163  --   --   --   < > = values in this interval not displayed. Basic Metabolic Panel  Recent Labs  06/28/13 1101 06/28/13 1939 06/29/13 0540  NA 143  --  141  K 4.4  --  3.7  CL 102  --  104  CO2 27  --  24  GLUCOSE 120*  --  124*  BUN 16  --  20  CREATININE 1.05 0.96 0.95  CALCIUM 9.8  --  9.1   Liver Function Tests  Recent Labs  06/28/13 1101  AST 22  ALT 19  ALKPHOS 62  BILITOT 0.6  PROT 7.4  ALBUMIN 4.5    Cardiac Enzymes  Recent Labs  06/28/13 1101 06/28/13 1939 06/28/13 2230  TROPONINI <0.30 <0.30 <0.30    Hemoglobin A1C  Recent Labs  06/28/13  1939  HGBA1C 6.4*   Fasting Lipid Panel  Recent Labs  06/29/13 0540  CHOL 112  HDL 33*  LDLCALC 62  TRIG 87  CHOLHDL 3.4   Thyroid Function Tests  Recent Labs  06/28/13 1101  TSH 2.900    TELE NSR Ventricular bigeminy and PVCs  Radiology/Studies  Dg Chest 2 View  06/28/2013   CLINICAL DATA:  Chest pain.  EXAM: CHEST  2 VIEW  COMPARISON:  DG CHEST 2 VIEW dated 01/13/2012; DG CHEST 2 VIEW dated 07/27/2007  FINDINGS: Mediastinum hilar structures normal. Question pulmonary nodule right and left lung base, and right upper lobe. Chest CT suggested for further evaluate. Lungs clear of acute infiltrates. Prior CABG. Cardiomegaly. Normal pulmonary vascularity. No pleural effusion or pneumothorax. Degenerative changes thoracic spine.  IMPRESSION: 1. Questionable new bilateral pulmonary nodules, chest CT suggested for further evaluation. 2. Prior CABG.  Cardiomegaly, no CHF.      Ct Chest Wo Contrast  06/28/2013   CLINICAL DATA:  Chest pain. Assess pulmonary nodule seen on chest x-ray.  EXAM: CT CHEST WITHOUT CONTRAST  TECHNIQUE: Multidetector CT imaging of the chest was performed following the standard protocol without IV contrast.  COMPARISON:  Chest x-ray 06/28/2013  FINDINGS: Linear densities within the lingula and right middle lobe likely reflect scarring and corresponds to some of the  density seen on prior chest x-ray. No nodular density is noted in the right upper lobe in the area questioned on chest x-ray.  There is a 5 mm well-circumscribed rounded nodule in the left lower lobe on image 45, nonspecific. 6 mm posterior right lower lobe nodule on image 45. Mild centrilobular emphysema. No pleural effusions.  Prior CABG. Heart is normal size. Aorta is normal caliber. No mediastinal, hilar, or axillary adenopathy. Chest wall soft tissues are unremarkable.  Imaging into the upper abdomen demonstrates left sided hydronephrosis, partially imaged. There is a nonobstructing stone in the midpole measuring 5 mm. Layering gallstones within the gallbladder.  IMPRESSION: Small 5-6 mm pulmonary nodules in the lower lobes. If the patient is at high risk for bronchogenic carcinoma, follow-up chest CT at 6-12 months is recommended. If the patient is at low risk for bronchogenic carcinoma, follow-up chest CT at 12 months is recommended. This recommendation follows the consensus statement: Guidelines for Management of Small Pulmonary Nodules Detected on CT Scans: A Statement from the [place] as published in Radiology 2005;237:395-400.  Scarring in the lingula and right middle lobe corresponding to some of the densities on chest x-ray.  Left-sided hydronephrosis and nephrolithiasis. Cannot exclude left ureteral stone. Recommend clinical correlation for left flank pain and hematuria.  Cholelithiasis.     ASSESSMENT AND PLAN  73 yo male w/ hx CABG 1998 admitted w/ chest pain 04/29, cath w/ DES to the proximal SVG to the OM2 and OM 3 and PTCA w/ Angiosculpt balloon to the distal graft limb for ISR.  Unstable angina - s/p cath 04/29 w/ results below:  "Difficult but successful percutaneous coronary intervention involving the proximal limb of the sequential vein graft with PTCA and DES stenting with a 3.0x26 mm Resolute Integrity DES stent, postdilated to 3.28 mm with the >95% stenosis reduced to 0  and Angiosculpt scoring balloon/PTCA of the sequential limb for instent restenosis reduced from 95% to 0."  "The patient developed significant thrombus and CAD progression on Plavix. He was restarted on Brilinta for anti-platelet therapy. Angiograms will be reviewed with colleagues. He underwent successful intervention to his remaining sequential vein graft, but  may require additional intervention to his LAD via the patent LIMA graft due to aggressive mid LAD stenosis beyond the graft anastomosis of 80%."  -- Groin site with no hematoma and neg for pseudoanyerism by doppler  Hematemesis - Brilinta was initially stopped d/t possible GI bleed. GI saw yesterday and s/p EGD revealing LA Grade A esophagitis and a 2 cm sliding hiatal hernia. It was recommended to continue with Protonix and resume Brilinta/ASA as the benefits are greater than the risks of bleeding from this area.   -- H/H improved. HG 13.1--> 12.9--> 12.3-->11.7-->12.2 -- Cont PPI indefinitely  HTN- good control on current Rx.   Signed, Perry Mount [name]  Pager [phone]

## 2013-07-03 ENCOUNTER — Telehealth: Payer: Self-pay | Admitting: Cardiology

## 2013-07-03 NOTE — Discharge Summary (Signed)
See my note from earlier in the day. He is ready for discharge.

## 2013-07-03 NOTE — Telephone Encounter (Signed)
Was supposed to be able to pick up [place] today.  Called up front and said no meds up there.  Please call

## 2013-07-03 NOTE — Telephone Encounter (Signed)
SAMPLES BROUGHT OVER BY THE DRUG REP AND IN DRAWER UP FRONT.  WIFE NOTIFIED AND WILL COME TODAY OR TOMORROW TO PICK UP.

## 2013-07-07 ENCOUNTER — Ambulatory Visit (INDEPENDENT_AMBULATORY_CARE_PROVIDER_SITE_OTHER): Payer: Medicare Other | Admitting: Cardiology

## 2013-07-07 VITALS — BP 158/72 | HR 66 | Ht 71.0 in | Wt 178.0 lb

## 2013-07-07 DIAGNOSIS — I200 Unstable angina: Secondary | ICD-10-CM

## 2013-07-07 DIAGNOSIS — I2510 Atherosclerotic heart disease of native coronary artery without angina pectoris: Secondary | ICD-10-CM | POA: Diagnosis not present

## 2013-07-07 NOTE — Patient Instructions (Signed)
1. Continue you meds as you are presently  2.Your physician recommends that you schedule a follow-up appointment in:  4 to6 weeks with [name]

## 2013-07-13 ENCOUNTER — Encounter: Payer: Self-pay | Admitting: Cardiology

## 2013-07-13 NOTE — Progress Notes (Signed)
Patient ID: Xavier Giles, male   DOB: 1940/03/21, 73 y.o.   MRN: [id number]    07/07/2013 Xavier Giles   1940-10-18  [id number]  Primary Physicia Wyatt Haste, MD Primary Cardiologist: [name]  Mr. Hanover presents to clinic today for post-hospital f/u after an admission for ACS.   HPI:  Xavier Giles is a 73 y.o. Male, followed by [name], with a history of HTN, HLD, and CAD s/p CABG x3 in 1998 and NSTEMI, s/p DES to SVG to OM1-OM3 (2013).  He presented to [facility] on 06/28/13 with a complaint of chest pain. This was in the setting of recent discontinuation of ASA, however he continued with Plavix. On arrival, he noted left-sided  chest discomfort, occuring at rest but worse with exertion. His symptoms were thought to be highly suggestive of crescendo angina and he was taken for cardiac cath.  The procedure was performed by Dr. Claiborne Billings.   Cath findings showed significant native coronary artery disease with 60% distal left main stenosis; 70% ostial LAD stenosis; 90% circumflex stenoses with occlusion of the marginal branches; a total proximal RCA occlusion with antegrade bridging collaterals and evidence for thrombus burden in the mid RCA and evidence for faint left to right collaterals to the distal RCA. There was total occlusion of the vein graft, which had supplied the diagonal vessel and total occlusion of the vein graft, which had supplied the distal RCA. The sequential vein graft supplying the circumflex marginal vessels was with high grade 95-99% proximal stenosis after an area of smooth, 50% narrowing, 50% mid graft stenosis prior to the initial marginal anastomosis and diffuse distal in-stent restenosis in tandem stents in the sequential limb of the graft supplying the distal marginal vessel. His LIMA graft supplying the mid LAD was patent, but with progressive 80% focal stenosis beyond the graft insertion. He underwent difficult but successful percutaneous coronary intervention  involving the proximal limb of the sequential vein graft with PTCA and DES stenting with a 3.0x26 mm Resolute Integrity DES stent, postdilated to 3.28 mm with the >95% stenosis reduced to 0 and Angiosculpt scoring balloon/PTCA of the sequential limb for instent restenosis reduced from 95% to 0.  He underwent successful intervention to his remaining sequential vein graft, but may require additional intervention to his LAD via the patent LIMA graft due to aggressive mid LAD stenosis beyond the graft anastomosis of 80%.   He was placed back on DAPT, however his Plavix was discontinued and he was started on Brilinta, along with ASA.  On the night after his intervention he developed coffee ground emesis. Brilinta was initially stopped d/t possible GI bleed. GI was consulted and he underwent an EGD revealing LA Grade A esophagitis and a 2 cm sliding hiatal hernia. It was recommended to continue with protonix and resume Brilinta/ASA as the benefits are greater than the risks of bleeding from this area. His H/H dropped slightly, but has been trending upwards. HG 13.1--> 12.9--> 12.3-->11.7-->12.2. He  had no further episodes of emesis. GI recommended that he continue a PPI indefinitely. He was discharged home on 06/30/13.  He presents back to clinic today for post hospital f/u. He notes significant improvement. He denies any recurrent chest pain. No pain with exertion. He has not required SL NTG use. No SOB. He has been fully compliant with his ASA, Brilinta and Protonix. He denies any further recurrence of coffee ground emesis. No melena or hematochezia. He has also been w/o weakness and fatigue.  Current Outpatient Prescriptions  Medication Sig Dispense Refill  . amLODipine-benazepril (LOTREL) 10-20 MG per capsule Take 1 capsule by mouth daily.  90 capsule  2  . aspirin EC 81 MG tablet Take 81 mg by mouth daily.      [name] atenolol (TENORMIN) 50 MG tablet TAKE ONE TABLET BY MOUTH EVERY DAY  90 tablet  1  .  hydrochlorothiazide (MICROZIDE) 12.5 MG capsule TAKE ONE CAPSULE BY MOUTH ONCE DAILY  30 capsule  6  . isosorbide mononitrate (IMDUR) 30 MG 24 hr tablet Take 1 tablet (30 mg total) by mouth daily.  30 tablet  6  . nitroGLYCERIN (NITROSTAT) 0.4 MG SL tablet Place 1 tablet (0.4 mg total) under the tongue every 5 (five) minutes as needed (up to 3 doses).  25 tablet  3  . pantoprazole (PROTONIX) 40 MG tablet Take 1 tablet (40 mg total) by mouth daily.  30 tablet  12  . pravastatin (PRAVACHOL) 40 MG tablet Take 1 tablet (40 mg total) by mouth daily.  30 tablet  11  . ticagrelor (BRILINTA) 90 MG TABS tablet Take 1 tablet (90 mg total) by mouth 2 (two) times daily.  60 tablet  12   No current facility-administered medications for this visit.    Allergies  Allergen Reactions  . Keflex [Cephalexin] Other (See Comments)    Unknown   . Lipitor [Atorvastatin] Other (See Comments)    Elevated blood sugar  . Sulfa Antibiotics Nausea Only  . Zocor [Simvastatin] Other (See Comments)    Unknown     History   Social History  . Marital Status: Married    Spouse Name: N/A    Number of Children: N/A  . Years of Education: N/A   Occupational History  . Not on file.   Social History Main Topics  . Smoking status: Former Smoker -- 2.00 packs/day for 30 years    Types: Cigarettes    Quit date: 03/02/1990  . Smokeless tobacco: Never Used  . Alcohol Use: Yes     Comment: 06/28/2013 "aien't drank in years; used to drink a little beer"  . Drug Use: No  . Sexual Activity: No   Other Topics Concern  . Not on file   Social History Narrative   Retired Married father of 2, grandfather tube.  Exercises routinely walking a roughly an hour a day.  Does not smoke does not drink.  He quit smoking many years ago.     Review of Systems: General: negative for chills, fever, night sweats or weight changes.  Cardiovascular: negative for chest pain, dyspnea on exertion, edema, orthopnea, palpitations,  paroxysmal nocturnal dyspnea or shortness of breath Dermatological: negative for rash Respiratory: negative for cough or wheezing Urologic: negative for hematuria Abdominal: negative for nausea, vomiting, diarrhea, bright red blood per rectum, melena, or hematemesis Neurologic: negative for visual changes, syncope, or dizziness All other systems reviewed and are otherwise negative except as noted above.    Blood pressure 158/72, pulse 66, height 5\' 11"  (1.803 m), weight 178 lb (80.74 kg).  General appearance: alert, cooperative and no distress Neck: no carotid bruit and no JVD Lungs: clear to auscultation bilaterally Heart: regular rate and rhythm, S1, S2 normal, no murmur, click, rub or gallop Extremities: no LEE Pulses: 2+ and symmetric Skin: warm and dry Neurologic: Grossly normal  EKG NSR no ischemic changes  ASSESSMENT AND PLAN:   CAD w/ H/O CABG Recent LHC 06/28/12 with patent LIMA-LAD but with but with progressive 80% focal  stenosis beyond the graft insertion (he may require PCI if recurrent pain).  Total occlusion of the vein graft, which had supplied the diagonal vessel. Total occlusion of the vein graft, which had supplied the distal RCA. S/p PCI +DES to SVG supplying the circumflex marginal vessels. Denies recurrent chest pain. No angina with exertion. Continue DAPT with ASA + Brilinta, along with BB, ACE, Nitrate and statin.  GI Bleed Denies further coffee ground emesis. No melena. Continue daily PPI. Continue DAPT. Pt instructed to contact our office if he has any recurrent issues.   HTN  BP is moderately elevated at 158/72. However patient states that he has not yet taken is am meds. He reports daily compliance, but states that he sticks to a 10:00 am 10:00 pm schedule (appt time was 8:30 am). He assured me that he will take his meds, as always.   HLD Continue statin  PLAN He appears to be doing well post cath. No recurrent symptoms. No change in medications today.  Patient was instructed to return in 4-6 weeks to see [name].   Reida Hem SimmonsPA-C 07/13/2013 4:37 PM

## 2013-07-15 ENCOUNTER — Other Ambulatory Visit: Payer: Self-pay | Admitting: Cardiology

## 2013-07-31 HISTORY — PX: NM MYOVIEW LTD: HXRAD82

## 2013-08-15 ENCOUNTER — Encounter: Payer: Self-pay | Admitting: Cardiology

## 2013-08-15 ENCOUNTER — Ambulatory Visit (INDEPENDENT_AMBULATORY_CARE_PROVIDER_SITE_OTHER): Payer: Medicare Other | Admitting: Cardiology

## 2013-08-15 VITALS — BP 140/72 | HR 59 | Ht 71.0 in | Wt 178.9 lb

## 2013-08-15 DIAGNOSIS — I214 Non-ST elevation (NSTEMI) myocardial infarction: Secondary | ICD-10-CM | POA: Diagnosis not present

## 2013-08-15 DIAGNOSIS — I200 Unstable angina: Secondary | ICD-10-CM

## 2013-08-15 DIAGNOSIS — E785 Hyperlipidemia, unspecified: Secondary | ICD-10-CM | POA: Diagnosis not present

## 2013-08-15 DIAGNOSIS — I2510 Atherosclerotic heart disease of native coronary artery without angina pectoris: Secondary | ICD-10-CM | POA: Diagnosis not present

## 2013-08-15 DIAGNOSIS — Z955 Presence of coronary angioplasty implant and graft: Secondary | ICD-10-CM

## 2013-08-15 DIAGNOSIS — I25810 Atherosclerosis of coronary artery bypass graft(s) without angina pectoris: Secondary | ICD-10-CM

## 2013-08-15 DIAGNOSIS — I10 Essential (primary) hypertension: Secondary | ICD-10-CM

## 2013-08-15 DIAGNOSIS — I951 Orthostatic hypotension: Secondary | ICD-10-CM

## 2013-08-15 DIAGNOSIS — Z9861 Coronary angioplasty status: Secondary | ICD-10-CM

## 2013-08-15 NOTE — Patient Instructions (Signed)
Your physician recommends that you schedule a follow-up appointment in: After Test  Your physician has requested that you have en exercise stress myoview. For further information please visit [email or web address]. Please follow instruction sheet, as given.

## 2013-08-17 ENCOUNTER — Encounter: Payer: Self-pay | Admitting: Cardiology

## 2013-08-17 NOTE — Progress Notes (Signed)
PCP: Wyatt Haste, MD  Clinic Note: Chief Complaint  Patient presents with  . Follow-up    5 -WEEK ,CHEST PAIN , SOB , SLIGHTLY RGT LEG EDEMA    HPI: Xavier Giles is a 73 y.o. male with a Cardiovascular Problem List below who presents today for a posthospital followup. He saw Ellen Henri on May 14. It is noted that he had a cardiac catheterization on April 29 showing patent LIMA LAD but progression of disease in the distal LAD a to 80%. There is also occlusion of the vein graft to diagonal and to the RCA. He underwent PCI to the vein graft to the circumflex.  We saw [place] he was doing well without any major complaints. He was on aspirin plus Brilinta.  Interval History: He presents today for his followup visit and notes that he has been having no major problems. He is doing relatively well walking 30-40 minutes at a time is not having any chest tightness or pressure. He says he does exert himself he'll get short of breath. He has mild intermittent palpitations but nothing that lasts more than a few seconds. No rapid heart rate. No PND, orthopnea with mild RLE edema. No syncope/near syncope, TIA/aumarosis fugax.  No melena, hematochezia, hematuria, epistaxis or claudication.  He does note that it is catheterization he has not really gotten Foley back into the level of intensity of exercise that he had been doing before.   Past Medical History  Diagnosis Date  . CAD, multiple vessel 1998    Referred for CABG  . S/P CABG x 5 1998    LIMA-LAD, SVG-OM2-OM3; SVG-rPDA, SVG-D1  . CAD (coronary artery disease) of bypass graft 2003    + Nuc ST: 100% SVG-rPDA; 80% D1 post SVG; 80% SVG-OM; patent LIMA-LAD;; PCI-SVG-OM (prox limb): 3.0 x 8 Zeta BMS- going into native OM2);; PTCA only native D1 through SVG  . Non-ST elevated myocardial infarction (non-STEMI) 01/2012    SVG-diagonal occluded, 50% distal left main, 70-80% proximal LAD and 90% mid. Patent LIMA with 70% lesion post  anastomosis. SVG to RCA occluded, SVG-OM2-3 90% sequential limb stenosis -- PCI SVG-OM 2 -OM 3: Xience Xpedition DES 2.75 MM x15 MM (3 MM)   . S/P cardiac catheterization 06/28/2013    Crescendo Angina: a) 60% dLM; 70% Ostial LAD; 90% Cx with no visualization of OMs; 100% RCA;; SVG-D1 occluded, SVG--dRCA occluded; Patent LIMA-LAD with dLAD ~70-80%;; SVG-OM2-OM3: 95% proximal, 50% mid, Diffuse ISR in BMS-OM2 & DES in Seq limb-OM3;; b) PCI:  prox SVG-OM-OM - Resolute DES 3.0 mm x 26 mm (3.3 mm); PTCA (Angiosculpt) to ISR in Seq limb - ~3.0 mm  . Stented coronary artery 2003; 01/2012; 05/2013    a) '03 BMS SVG-OM2-OM3 (initial limb into OM2);; b) 2013 - DES to Seq Lmg SVG-OM2-OM3 - Xience DES;; c) 05/2013 Prox SVG--OM2-OM3 Resolute DES 3.0 mm x 26 mm; Angiosculpt PTCA of ISR in Seq Limb (3.0 mm)  . Hypertension, essential   . Dyslipidemia, goal LDL below 70   . Carotid bruit present 01/2012    a. asymptomatic; carotid doppler 01/2012 - R bulb/prox ICA mild-mod amt of fibrous plaque, L subclavian 70-99% diameter reduction, L bulb/ICA mild amt of fibrous plaque  . Chronic low back pain   . Asthma    MEDICATIONS AND ALLERGIES REVIEWED IN EPIC No Change in Social and Family History  ROS: A comprehensive Review of Systems - Negative except Still having some mild decreased exercise tolerance and fatigue. chronic  back pains & hard of hearing  PHYSICAL EXAM BP 140/72  Pulse 59  Ht 5\' 11"  (1.803 m)  Wt 178 lb 14.4 oz (81.149 kg)  BMI 24.96 kg/m2 General: He is a pleasant, healthy-appearing gentleman. He looks younger than his stated age. He is in no acute distress, alert and oriented x3, answers questions appropriately.  HEENT: NCAT, EOMI, MMM, anicteric sclerae.  Neck: Supple; no LAN, JVD, with soft carotid bruits bilaterally, more notable on the right.  Heart: RRR, normal S1, S2. No M/R/G.  Lungs: CTAB,  nonlabored, normal effort, good air movement.  Abdomen: Soft/NT/ND/NABS. No HSM. Extremities: No  C/C/E, 2+ equal pulses throughout. Strength 5/5 throughout.    Adult ECG Report  Rate: 58 ;  Rhythm: sinus arrhythmia; Septal MI, age undetermined.  Recent Labs: None  ASSESSMENT / PLAN: Unstable angina Status post recent intervention to the vein graft supplying the OM branches. He's doing well from an angina standpoint but not back to his full level of activity. He still has the existing disease in his LAD that warrants evaluation.  Plan: Treadmill Myoview/Cardiolite looking for anterior ischemia  CAD, multiple vessel;  CABG in 1998; BMS- SVG-OM with cutting balloon to diag in 2003; DES to Seq SVG-OM2-3 (2013); DES - Prox SVG-OM2-3 & PTCA of ISR in Seq limb;; Residual ~80% in LAD post LIMA Unfortunately we had to stop his aspirin prior to his presentation with unstable angina. The stomach technique go back to aspirin plus Brilinta which was started during his last visit. The plan from his most recent visit was to check a treadmill Myoview to assess the LAD lesion, now that he is proven to be symptomatic more recently, we will then check now.  Otherwise, he is on a [place]-acting nitrate, beta blocker, ACE inhibitor and calcium channel blocker. His also on statin.  CAD (coronary artery disease) of bypass graft Most of the stents have been related to vein graft disease. He only has one remaining vein graft along with a patent LIMA. He now has 3 stents in the vein graft to the OMs.  At this point I think continued on dual antiplatelet therapy is quite reasonable. I think we would do in [facility] for a year simply for better coverage, then can consider backing down to Plavix, but keeping aspirin.  Essential hypertension Overall borderline control. He doesn't issue orthostatic hypotension, therefore I don't want to be too aggressive.  Dyslipidemia, goal LDL below 70 His labs were checked back in April. Total cholesterol is 112, anterior cruciate ligament 33 and LDL 62 triglycerides 87.  Excellent control. It is not be rechecked until next year.    Orders Placed This Encounter  Procedures  . Myocardial Perfusion Imaging    Standing Status: Future     Number of Occurrences:      Standing Expiration Date: 08/16/2014    Scheduling Instructions:     Cad    Order Specific Question:  Type of stress    Answer:  Treadmill    Order Specific Question:  Patient weight in lbs    Answer:  178  . EKG 12-Lead   No orders of the defined types were placed in this encounter.    Followup: 1 months  DAVID W. Ellyn Hack, M.D., M.S. Interventional Cardiologist [facility]   a.  SVG-->OM2-OM 3 and PTCA w/ Angiosculpt balloon to the distal graft limb for ISR.  b. LIMA-LAD, SVG-OM2-OM 3, SVG to PDA, SVG-D1 c.. SVG-diagonal occluded, 50% distal left main, 70-80% proximal LAD and  90% mid.  Patent LIMA with 70% lesion post anastomosis.  SVG to RCA occluded, SVG-OM2-3 90% sequential limb stenosis -- PCI SVG-OM 2 -OM 3: Xience Xpedition DES

## 2013-08-18 NOTE — Assessment & Plan Note (Signed)
Most of the stents have been related to vein graft disease. He only has one remaining vein graft along with a patent LIMA. He now has 3 stents in the vein graft to the OMs.  At this point I think continued on dual antiplatelet therapy is quite reasonable. I think we would do in [facility] for a year simply for better coverage, then can consider backing down to Plavix, but keeping aspirin.

## 2013-08-18 NOTE — Assessment & Plan Note (Addendum)
Unfortunately we had to stop his aspirin prior to his presentation with unstable angina. The stomach technique go back to aspirin plus Brilinta which was started during his last visit. The plan from his most recent visit was to check a treadmill Myoview to assess the LAD lesion, now that he is proven to be symptomatic more recently, we will then check now.  Otherwise, he is on a [place]-acting nitrate, beta blocker, ACE inhibitor and calcium channel blocker. His also on statin.

## 2013-08-18 NOTE — Assessment & Plan Note (Signed)
Overall borderline control. He doesn't issue orthostatic hypotension, therefore I don't want to be too aggressive.

## 2013-08-18 NOTE — Assessment & Plan Note (Signed)
Status post recent intervention to the vein graft supplying the OM branches. He's doing well from an angina standpoint but not back to his full level of activity. He still has the existing disease in his LAD that warrants evaluation.  Plan: Treadmill Myoview/Cardiolite looking for anterior ischemia

## 2013-08-18 NOTE — Assessment & Plan Note (Signed)
His labs were checked back in April. Total cholesterol is 112, anterior cruciate ligament 33 and LDL 62 triglycerides 87. Excellent control. It is not be rechecked until next year.

## 2013-08-23 ENCOUNTER — Telehealth (HOSPITAL_COMMUNITY): Payer: Self-pay

## 2013-08-25 ENCOUNTER — Ambulatory Visit (HOSPITAL_COMMUNITY)
Admission: RE | Admit: 2013-08-25 | Discharge: 2013-08-25 | Disposition: A | Discharge status: Home / Self Care | Payer: Medicare Other | Source: Ambulatory Visit | Attending: Cardiovascular Disease | Admitting: Cardiovascular Disease

## 2013-08-25 DIAGNOSIS — Z8249 Family history of ischemic heart disease and other diseases of the circulatory system: Secondary | ICD-10-CM | POA: Insufficient documentation

## 2013-08-25 DIAGNOSIS — R0602 Shortness of breath: Secondary | ICD-10-CM | POA: Insufficient documentation

## 2013-08-25 DIAGNOSIS — E119 Type 2 diabetes mellitus without complications: Secondary | ICD-10-CM | POA: Diagnosis not present

## 2013-08-25 DIAGNOSIS — I10 Essential (primary) hypertension: Secondary | ICD-10-CM | POA: Insufficient documentation

## 2013-08-25 DIAGNOSIS — Z951 Presence of aortocoronary bypass graft: Secondary | ICD-10-CM | POA: Diagnosis not present

## 2013-08-25 DIAGNOSIS — I252 Old myocardial infarction: Secondary | ICD-10-CM | POA: Diagnosis not present

## 2013-08-25 DIAGNOSIS — R079 Chest pain, unspecified: Secondary | ICD-10-CM | POA: Diagnosis not present

## 2013-08-25 DIAGNOSIS — I200 Unstable angina: Secondary | ICD-10-CM | POA: Diagnosis not present

## 2013-08-25 DIAGNOSIS — J45909 Unspecified asthma, uncomplicated: Secondary | ICD-10-CM | POA: Diagnosis not present

## 2013-08-25 DIAGNOSIS — R0609 Other forms of dyspnea: Secondary | ICD-10-CM | POA: Diagnosis not present

## 2013-08-25 DIAGNOSIS — R002 Palpitations: Secondary | ICD-10-CM | POA: Insufficient documentation

## 2013-08-25 DIAGNOSIS — I2510 Atherosclerotic heart disease of native coronary artery without angina pectoris: Secondary | ICD-10-CM | POA: Diagnosis not present

## 2013-08-25 DIAGNOSIS — Z87891 Personal history of nicotine dependence: Secondary | ICD-10-CM | POA: Insufficient documentation

## 2013-08-25 DIAGNOSIS — Z9861 Coronary angioplasty status: Secondary | ICD-10-CM | POA: Diagnosis not present

## 2013-08-25 DIAGNOSIS — E785 Hyperlipidemia, unspecified: Secondary | ICD-10-CM

## 2013-08-25 DIAGNOSIS — R0989 Other specified symptoms and signs involving the circulatory and respiratory systems: Secondary | ICD-10-CM | POA: Insufficient documentation

## 2013-08-25 MED ORDER — TECHNETIUM TC 99M SESTAMIBI GENERIC - CARDIOLITE
30.1000 | Freq: Once | INTRAVENOUS | Status: AC | PRN
  Administered 2013-08-25: 30.1 via INTRAVENOUS

## 2013-08-25 MED ORDER — TECHNETIUM TC 99M SESTAMIBI GENERIC - CARDIOLITE
10.4000 | Freq: Once | INTRAVENOUS | Status: AC | PRN
Start: 2013-08-25 — End: 2013-08-25
  Administered 2013-08-25: 10 via INTRAVENOUS

## 2013-08-25 NOTE — Procedures (Addendum)
[facility] [address] [place] [address] [phone]  Cardiology Nuclear Med Study  Xavier Giles is a 73 y.o. male     MRN : [id number]     DOB: 1940/10/24  Procedure Date: 08/25/2013  Nuclear Med Background Indication for Stress Test:  Evaluation for Ischemia, Graft Patency and [facility] History:  Asthma and CAD;MI;CABG X5-1998;STENT/PTCA-2003, 01/2012,  06/28/2013;Last NUC MPI on 11/201/2012-nonischemic;EF-not gated Cardiac Risk Factors: Family History - CAD, History of Smoking, Hypertension, Lipids, NIDDM and carotid bruit;orthostatic hypotension  Symptoms:  Chest Pain, DOE, Fatigue, Palpitations and SOB   Nuclear Pre-Procedure Caffeine/Decaff Intake:  7:00pm NPO After: 5:00am   IV Site: R Forearm  IV 0.9% NS with Angio Cath:  22g  Chest Size (in):  46"  IV Started by: Rolene Course, RN  Height: 5\' 11"  (1.803 m)  Cup Size: n/a  BMI:  Body mass index is 24.84 kg/(m^2). Weight:  178 lb (80.74 kg)   Tech Comments:  n/a    Nuclear Med Study 1 or 2 day study: 1 day  Stress Test Type:  Stress  Order Authorizing Rees Matura:  Glenetta Hew, MD   Resting Radionuclide: Technetium 41m Sestamibi  Resting Radionuclide Dose: 10.4 mCi   Stress Radionuclide:  Technetium 86m Sestamibi  Stress Radionuclide Dose: 30.1 mCi           Stress Protocol Rest HR: 71 Stress HR: 130  Rest BP: 148/78 Stress BP: 182/77  Exercise Time (min): 8 METS: 10.1   Predicted Max HR: 146 bpm % Max HR: 89.04 bpm Rate Pressure Product: 23660  Dose of Adenosine (mg):  n/a Dose of Lexiscan: n/a mg  Dose of Atropine (mg): n/a Dose of Dobutamine: n/a mcg/kg/min (at max HR)  Stress Test Technologist: Leane Para, CCT Nuclear Technologist: Imagene Riches, CNMT   Rest Procedure:  Myocardial perfusion imaging was performed at rest 45 minutes following the intravenous administration of Technetium 72m Sestamibi. Stress Procedure:  The patient  performed treadmill exercise using a Bruce  Protocol for 8 minutes. The patient stopped due to Severe SOB and denied any chest pain.  There were no significant ST-T wave changes.  Technetium 62m Sestamibi was injected IV at peak exercise and myocardial perfusion imaging was performed after a brief delay.  Transient Ischemic Dilatation (Normal <1.22):  1.24  QGS EDV:  n/a ml QGS ESV:  n/a ml LV Ejection Fraction: Study not gated     Rest ECG: NSR with QS V1-2; ILBBB and PVC  Stress ECG: Scattered PVC's with 2.5 mm inferolateral ST depression   QPS Raw Data Images:  Normal; no motion artifact; normal heart/lung ratio. Stress Images:  Small in size and mild in intensity inferior defect Rest Images:  Comparison with the stress images reveals similar inferior defect with additional attenuation defect. Subtraction (SDS):  No evidence of ischemia.  Impression Exercise Capacity:  Good exercise capacity. BP Response:  BP initially increased to 182 during Stage I and decreased to 165 at peak stress Clinical Symptoms:  Shortness of breath ECG Impression:  Significant ST abnormalities consistent with ischemia. Comparison with Prior Nuclear Study: No significant change from previous study  Overall Impression:  Low risk stress nuclear study small area of nontransmural inferior scar without ischemia.  LV Wall Motion:  Not gated due to ectopy.   Troy Sine, MD  08/25/2013 10:45 AM

## 2013-08-25 NOTE — Progress Notes (Signed)
Quick Note:  Stress Test looked good!!  Evidence of possible old MI / scar, but nothing to suggest ischemia.  Good news!!.  [name], MD  ______

## 2013-08-28 ENCOUNTER — Telehealth: Payer: Self-pay | Admitting: *Deleted

## 2013-08-28 NOTE — Telephone Encounter (Signed)
Spoke to patient. Result given . Verbalized understanding  

## 2013-08-28 NOTE — Telephone Encounter (Signed)
Message copied by Raiford Simmonds on Mon Aug 28, 2013 12:30 PM ------      Message from: [name]      Created: Fri Aug 25, 2013 10:21 PM       Stress Test looked good!!       Evidence of possible old MI / scar, but nothing to suggest ischemia.            Good news!!.            [name], MD       ------

## 2013-08-31 NOTE — Telephone Encounter (Signed)
Encounter complete. 

## 2013-09-04 ENCOUNTER — Telehealth: Payer: Self-pay | Admitting: *Deleted

## 2013-09-04 DIAGNOSIS — E785 Hyperlipidemia, unspecified: Secondary | ICD-10-CM

## 2013-09-04 DIAGNOSIS — Z79899 Other long term (current) drug therapy: Secondary | ICD-10-CM

## 2013-09-04 NOTE — Telephone Encounter (Signed)
Message copied by Raiford Simmonds on Mon Sep 04, 2013  8:44 AM ------      Message from: Douglas: Mon Jun 05, 2013  5:01 PM       NMR WITH Marda Stalker IN July 2015      DUE IN AUG 2015 ------

## 2013-09-04 NOTE — Telephone Encounter (Signed)
Letter mail with lab slip NMR LIPID,CMP

## 2013-10-08 ENCOUNTER — Other Ambulatory Visit: Payer: Self-pay | Admitting: Cardiology

## 2013-10-09 NOTE — Telephone Encounter (Signed)
Rx refill sent to patient pharmacy   

## 2013-10-30 ENCOUNTER — Encounter: Payer: Self-pay | Admitting: Cardiology

## 2013-10-30 ENCOUNTER — Ambulatory Visit (INDEPENDENT_AMBULATORY_CARE_PROVIDER_SITE_OTHER): Payer: Medicare Other | Admitting: Cardiology

## 2013-10-30 VITALS — BP 118/60 | HR 54 | Ht 71.0 in | Wt 177.2 lb

## 2013-10-30 DIAGNOSIS — E785 Hyperlipidemia, unspecified: Secondary | ICD-10-CM

## 2013-10-30 DIAGNOSIS — I25810 Atherosclerosis of coronary artery bypass graft(s) without angina pectoris: Secondary | ICD-10-CM | POA: Diagnosis not present

## 2013-10-30 DIAGNOSIS — I10 Essential (primary) hypertension: Secondary | ICD-10-CM

## 2013-10-30 DIAGNOSIS — I200 Unstable angina: Secondary | ICD-10-CM

## 2013-10-30 DIAGNOSIS — I2510 Atherosclerotic heart disease of native coronary artery without angina pectoris: Secondary | ICD-10-CM | POA: Diagnosis not present

## 2013-10-30 NOTE — Patient Instructions (Signed)
MAY STOP  ASPIRIN WHILE BRILINTA  MAY HOLD IMDUR FOR ONE MONTH ,IF NO ISSUE YOU MAY CONTINUE TO HOLD MEDICATIONS  Your physician wants you to follow-up in 6 MONTH Dr Ellyn Hack.  You will receive a reminder letter in the mail two months in advance. If you don't receive a letter, please call our office to schedule the follow-up appointment.

## 2013-10-31 ENCOUNTER — Encounter: Payer: Self-pay | Admitting: Cardiology

## 2013-10-31 NOTE — Assessment & Plan Note (Signed)
He seems to be doing better overall, finally getting back into his normal routine. Stress test is reassuring that the LAD lesion beyond the anastomosis of the LIMA graft is likely insignificant physiologically. He is on aspirin across return to. Because of his GI history I think we can try to stop the aspirin as [place]oll as he remains on Brilinta. If he goes to Plavix, I would restart aspirin. He is on a beta blocker, calcium channel blockers plus ACE inhibitor and statin. He is also on Imdur that I think he may be a we'll stop now that he has had his intervention.  Plan: Okay to stop aspirin. Okay to wean off Imdur for a trial run. If he has to use nitroglycerin sublingual, would then restart Imdur permanently.

## 2013-10-31 NOTE — Progress Notes (Signed)
PCP: Wyatt Haste, MD  Clinic Note: Chief Complaint  Patient presents with  . Follow-up    RESULTS MYOVIEW, NO CHEST PAIN , NO SOB , NO EDEMA    HPI: Xavier Giles is a 73 y.o. male with a Cardiovascular Problem List below who presents today followup after having a stress test performed to evaluate exertional dyspnea in the setting of known CAD from recent cath. The intention was to evaluate the significance of the LAD lesion beyond the LIMA anastomosis. The stress test was read as only having a small nontransmural inferior infarct without ischemia.  As you recall, he presented with crescendo angina back in April and underwent cardiac catheterization showing patent LIMA LAD but progression of disease in the distal LAD a to 80%. There is also occlusion of the vein graft to diagonal and to the RCA. He underwent PCI to the vein graft to the circumflex.  He initially was doing relatively well post PCI, when I saw him in June we ordered a Myoview stress test to evaluate some exertional dyspnea.  Interval History: He presents today for his followup visit and notes that he really has been having no major problems. He is doing relatively well walking up to an hour a day most days a week and is not having any chest tightness or pressure. He says when he does over exert himself, he'll get short of breath. He has minimal intermittent palpitations but nothing that lasts more than a few seconds. No rapid heart rate. No PND, orthopnea with mild RLE edema. No syncope/near syncope, TIA/aumarosis fugax.  No melena, hematochezia, hematuria, epistaxis or claudication. He denies any melena, hematochezia, hematuria.  He feels like he is probably getting back into his normal routine now more so than he was at last visit.  Past Medical History  Diagnosis Date  . CAD, multiple vessel 1998    Referred for CABG  . S/P CABG x 5 1998    LIMA-LAD, SVG-OM2-OM3; SVG-rPDA, SVG-D1  . CAD (coronary artery disease)  of bypass graft 2003    + Nuc ST: 100% SVG-rPDA; 80% D1 post SVG; 80% SVG-OM; patent LIMA-LAD;; PCI-SVG-OM (prox limb): 3.0 x 8 Zeta BMS- going into native OM2);; PTCA only native D1 through SVG  . Non-ST elevated myocardial infarction (non-STEMI) 01/2012    SVG-diagonal occluded, 50% distal left main, 70-80% proximal LAD and 90% mid. Patent LIMA with 70% lesion post anastomosis. SVG to RCA occluded, SVG-OM2-3 90% sequential limb stenosis -- PCI SVG-OM 2 -OM 3: Xience Xpedition DES 2.75 MM x15 MM (3 MM)   . S/P cardiac catheterization 06/28/2013    Cres Angina: a) 60% dLM; 70% Ostial LAD; 90% Cx - no OMs seen; 100% RCA;; SVG-D1 & SVG--dRCA occluded; Patent LIMA-LAD with dLAD ~70-80%;; SVG-OM2-OM3: 95% proximal, 50% mid, Diffuse ISR in BMS-OM2 & DES in Seq limb-OM3;; b) PCI:  prox SVG-OM-OM - Resolute DES 3.0 mm x 26 mm (3.3 mm); PTCA (Angiosculpt) to ISR in Seq limb - ~3.0 mm.; c)  Myoview 08/25/13 - small non-T-mural Inf scar, No Ischemia  . Stented coronary artery 2003; 01/2012; 05/2013    a) '03 BMS SVG-OM2-OM3 (initial limb into OM2);; b) 2013 - DES to Seq Lmg SVG-OM2-OM3 - Xience DES;; c) 05/2013 Prox SVG--OM2-OM3 Resolute DES 3.0 mm x 26 mm; Angiosculpt PTCA of ISR in Seq Limb (3.0 mm)  . Hypertension, essential   . Dyslipidemia, goal LDL below 70   . Carotid bruit present 01/2012    a. asymptomatic; carotid  doppler 01/2012 - R bulb/prox ICA mild-mod amt of fibrous plaque, L subclavian 70-99% diameter reduction, L bulb/ICA mild amt of fibrous plaque  . Chronic low back pain   . Asthma    MEDICATIONS AND ALLERGIES REVIEWED IN EPIC No Change in Social and Family History  ROS: A comprehensive Review of Systems - Negative except  mild dyspnea with significant exertion. Otherwise continues to note chronic back pains & hard of hearing  PHYSICAL EXAM BP 118/60  Pulse 54  Ht 5\' 11"  (1.803 m)  Wt 177 lb 3.2 oz (80.377 kg)  BMI 24.73 kg/m2 General: He is a pleasant, healthy-appearing gentleman. He  looks younger than his stated age. He is in no acute distress, alert and oriented x3, answers questions appropriately.  HEENT: NCAT, EOMI, MMM, anicteric sclerae.  Neck: Supple; no LAN, JVD, with soft carotid bruits bilaterally, more notable on the right.  Heart: RRR, normal S1, S2. No M/R/G.  Lungs: CTAB,  nonlabored, normal effort, good air movement.  Abdomen: Soft/NT/ND/NABS. No HSM. Extremities: No C/C/E, 2+ equal pulses throughout. Strength 5/5 throughout.    Adult ECG Report not performed-   Recent Labs: NoneSince April 30th 2015   ASSESSMENT / PLAN: CAD, multiple vessel;  CABG in 1998; BMS- SVG-OM with cutting balloon to diag in 2003; DES to Seq SVG-OM2-3 (2013); DES - Prox SVG-OM2-3 & PTCA of ISR in Seq limb;; Residual ~80% in LAD post LIMA He seems to be doing better overall, finally getting back into his normal routine. Stress test is reassuring that the LAD lesion beyond the anastomosis of the LIMA graft is likely insignificant physiologically. He is on aspirin across return to. Because of his GI history I think we can try to stop the aspirin as Berish as he remains on Brilinta. If he goes to Plavix, I would restart aspirin. He is on a beta blocker, calcium channel blockers plus ACE inhibitor and statin. He is also on Imdur that I think he may be a we'll stop now that he has had his intervention.  Plan: Okay to stop aspirin. Okay to wean off Imdur for a trial run. If he has to use nitroglycerin sublingual, would then restart Imdur permanently.   Coronary artery disease involving coronary bypass graft of native heart without angina pectoris Multiple stents the vein grafts. He only has one remaining vein graft that has been stented along with sequential segments. His LIMA is patent but has LAD disease. As above, he should be well covered with Brilinta and off of aspirin at this point.   Dyslipidemia, goal LDL below 70 Relatively well-controlled lipid panel in April. Continue on  pravastatin.  Recheck lipids in April 2016 with a NMR panel.  Essential hypertension Well-controlled. Continue current regimen.    No orders of the defined types were placed in this encounter.   No orders of the defined types were placed in this encounter.    Followup: 6 months  DAVID W. Ellyn Hack, M.D., M.S. Interventional Cardiologist [facility]

## 2013-10-31 NOTE — Assessment & Plan Note (Signed)
Multiple stents the vein grafts. He only has one remaining vein graft that has been stented along with sequential segments. His LIMA is patent but has LAD disease. As above, he should be well covered with Brilinta and off of aspirin at this point.

## 2013-10-31 NOTE — Assessment & Plan Note (Signed)
Well-controlled.  Continue current regimen. 

## 2013-10-31 NOTE — Assessment & Plan Note (Signed)
Relatively well-controlled lipid panel in April. Continue on pravastatin.  Recheck lipids in April 2016 with a NMR panel.

## 2013-11-23 ENCOUNTER — Other Ambulatory Visit: Payer: Self-pay | Admitting: *Deleted

## 2013-11-23 ENCOUNTER — Other Ambulatory Visit: Payer: Self-pay | Admitting: Cardiology

## 2013-11-23 MED ORDER — HYDROCHLOROTHIAZIDE 12.5 MG PO CAPS: 12.5000 mg | ORAL_CAPSULE | Freq: Every day | ORAL | Status: DC

## 2014-01-06 ENCOUNTER — Other Ambulatory Visit: Payer: Self-pay | Admitting: Cardiology

## 2014-01-08 NOTE — Telephone Encounter (Signed)
Rx refill sent to patient pharmacy   

## 2014-02-08 ENCOUNTER — Encounter (HOSPITAL_COMMUNITY): Payer: Self-pay | Admitting: Cardiovascular Disease

## 2014-02-24 ENCOUNTER — Other Ambulatory Visit: Payer: Self-pay | Admitting: Physician Assistant

## 2014-02-26 NOTE — Telephone Encounter (Signed)
Rx refill sent to patient pharmacy   

## 2014-07-06 ENCOUNTER — Other Ambulatory Visit: Payer: Self-pay | Admitting: Cardiology

## 2014-07-06 NOTE — Telephone Encounter (Signed)
Rx(s) sent to pharmacy electronically.  

## 2014-07-16 ENCOUNTER — Telehealth: Payer: Self-pay | Admitting: Cardiology

## 2014-07-16 NOTE — Telephone Encounter (Signed)
Pt's wife called in wanting to know if Dr.Harding had a chance to sign the paperwork that she brought to the office concerning his Brilinta medication. Please f/u with her  Thanks

## 2014-07-16 NOTE — Telephone Encounter (Signed)
Spoke to wife Informed wife - [name] form is waiting to be signed by Dr Emilie Rutter first day back in the office. Erasmo Downer will handle form signature. Wife is aware.

## 2014-07-18 NOTE — Telephone Encounter (Signed)
Application form faxed to [employer] and me

## 2014-07-19 ENCOUNTER — Telehealth: Payer: Self-pay | Admitting: *Deleted

## 2014-07-19 NOTE — Telephone Encounter (Signed)
Informed wife-- application faxed on 3/88/87 Wife states [place] and me - CONTACTED --YEAR REFILL GIVEN

## 2014-08-28 ENCOUNTER — Encounter: Payer: Self-pay | Admitting: Cardiology

## 2014-08-28 ENCOUNTER — Ambulatory Visit (INDEPENDENT_AMBULATORY_CARE_PROVIDER_SITE_OTHER): Payer: Medicare Other | Admitting: Cardiology

## 2014-08-28 VITALS — BP 120/66 | HR 60 | Ht 71.0 in | Wt 171.2 lb

## 2014-08-28 DIAGNOSIS — I2510 Atherosclerotic heart disease of native coronary artery without angina pectoris: Secondary | ICD-10-CM | POA: Diagnosis not present

## 2014-08-28 DIAGNOSIS — I10 Essential (primary) hypertension: Secondary | ICD-10-CM | POA: Diagnosis not present

## 2014-08-28 DIAGNOSIS — I214 Non-ST elevation (NSTEMI) myocardial infarction: Secondary | ICD-10-CM | POA: Diagnosis not present

## 2014-08-28 DIAGNOSIS — Z951 Presence of aortocoronary bypass graft: Secondary | ICD-10-CM

## 2014-08-28 DIAGNOSIS — I25810 Atherosclerosis of coronary artery bypass graft(s) without angina pectoris: Secondary | ICD-10-CM | POA: Diagnosis not present

## 2014-08-28 DIAGNOSIS — E785 Hyperlipidemia, unspecified: Secondary | ICD-10-CM | POA: Diagnosis not present

## 2014-08-28 LAB — COMPREHENSIVE METABOLIC PANEL
ALBUMIN: 4.8 g/dL (ref 3.5–5.2)
ALT: 12 U/L (ref 0–53)
AST: 17 U/L (ref 0–37)
Alkaline Phosphatase: 51 U/L (ref 39–117)
BUN: 15 mg/dL (ref 6–23)
CALCIUM: 9.7 mg/dL (ref 8.4–10.5)
CHLORIDE: 103 meq/L (ref 96–112)
CO2: 25 meq/L (ref 19–32)
CREATININE: 1.09 mg/dL (ref 0.50–1.35)
Glucose, Bld: 127 mg/dL — ABNORMAL HIGH (ref 70–99)
POTASSIUM: 4.4 meq/L (ref 3.5–5.3)
Sodium: 139 mEq/L (ref 135–145)
Total Bilirubin: 0.8 mg/dL (ref 0.2–1.2)
Total Protein: 6.9 g/dL (ref 6.0–8.3)

## 2014-08-28 LAB — LIPID PANEL
CHOL/HDL RATIO: 2.9 ratio
CHOLESTEROL: 103 mg/dL (ref 0–200)
HDL: 36 mg/dL — ABNORMAL LOW (ref >=40)
LDL Cholesterol: 48 mg/dL (ref 0–99)
Triglycerides: 94 mg/dL (ref <150)
VLDL: 19 mg/dL (ref 0–40)

## 2014-08-28 NOTE — Patient Instructions (Signed)
PLEASE DO LABS- CMP ,LIPIDS- DO NOT EAT OR DRINK  THE MORNING OF TEST.  NO CHANGE IN CURRENT MEDICATIONS   Your physician wants you to follow-up in [place].  You will receive a reminder letter in the mail two months in advance. If you don't receive a letter, please call our office to schedule the follow-up appointment.

## 2014-08-28 NOTE — Progress Notes (Signed)
PCP: No primary care provider on file.  Clinic Note: Chief Complaint  Patient presents with  . Annual Exam    1 year followup; no chest pain, shortness of breath-has a little with exertion, edema-has a little in right leg, no pain in legs, no cramping in legs, lightheadedness-occassional, dizziness-occassional  , NO CHOL LABS SINCE 05/2013  . Coronary Artery Disease    HPI: Xavier Giles is a 74 y.o. male with a PMH below who presents today for ~ annual follow-up for CAD-CABG/PCI. Last seen in Aug 2015 to f/u Myoview to evaluate the significance of the LAD lesion beyond the LIMA-LAD anastomosis.  April 2015: Unstable angina: Cardiac catheterization showing patent LIMA-LAD but progression of disease in the distal LAD. Also vision of SVG-diagonal and SVG-RCA. PCI to SVG-circumflex.  08/24/13: Myoview Stress Test looked good!! Evidence of possible old MI / scar, but nothing to suggest ischemia. Not Gated b/c ectopy  Past Medical History  Diagnosis Date  . CAD, multiple vessel 1998    Referred for CABG  . S/P CABG x 5 1998    LIMA-LAD, SVG-OM2-OM3; SVG-rPDA, SVG-D1  . CAD (coronary artery disease) of bypass graft 2003    + Nuc ST: 100% SVG-rPDA; 80% D1 post SVG; 80% SVG-OM; patent LIMA-LAD;; PCI-SVG-OM (prox limb): 3.0 x 8 Zeta BMS- going into native OM2);; PTCA only native D1 through SVG  . Non-ST elevated myocardial infarction (non-STEMI) 01/2012    SVG-diagonal occluded, 50% distal left main, 70-80% proximal LAD and 90% mid. Patent LIMA with 70% lesion post anastomosis. SVG to RCA occluded, SVG-OM2-3 90% sequential limb stenosis -- PCI SVG-OM 2 -OM 3: Xience Xpedition DES 2.75 MM x15 MM (3 MM)   . S/P cardiac catheterization 06/28/2013    Cres Angina: a) 60% dLM; 70% Ostial LAD; 90% Cx - no OMs seen; 100% RCA;; SVG-D1 & SVG--dRCA occluded; Patent LIMA-LAD with dLAD ~70-80%;; SVG-OM2-OM3: 95% proximal, 50% mid, Diffuse ISR in BMS-OM2 & DES in Seq limb-OM3;; b) PCI:  prox SVG-OM-OM -  Resolute DES 3.0 mm x 26 mm (3.3 mm); PTCA (Angiosculpt) to ISR in Seq limb - ~3.0 mm.; c)  Myoview 08/25/13 - small non-T-mural Inf scar, No Ischemia  . Stented coronary artery 2003; 01/2012; 05/2013    a) '03 BMS SVG-OM2-OM3 (initial limb into OM2);; b) 2013 - DES to Seq Lmg SVG-OM2-OM3 - Xience DES;; c) 05/2013 Prox SVG--OM2-OM3 Resolute DES 3.0 mm x 26 mm; Angiosculpt PTCA of ISR in Seq Limb (3.0 mm)  . Hypertension, essential   . Dyslipidemia, goal LDL below 70   . Carotid bruit present 01/2012    a. asymptomatic; carotid doppler 01/2012 - R bulb/prox ICA mild-mod amt of fibrous plaque, L subclavian 70-99% diameter reduction, L bulb/ICA mild amt of fibrous plaque  . Chronic low back pain   . Asthma     Prior Cardiac Evaluation and Past Surgical History: Past Surgical History  Procedure Laterality Date  . Coronary angioplasty with stent placement  2003    BMS 3.0 mm x 8 mm stent to SVG to OM & cuttin balloon to diagonal; 100% occluded vein graft to PDA, patent LIMA; 80% stenosis in SVG-diagnal; vein graft stenosis in OM  . Coronary angioplasty with stent placement  01/2012    PCI & stenting of SVG-OM2 to OM3 segment with Xience Xpedition 2.51mmx15mm DES  . Coronary angioplasty with stent placement  06/28/2013    Resolute DES 3.0 mm x 26 mm (3.3 mm) to prox SVG-OM2-OM3; Angiosculpt PTCA of  Seq limb ISR;; Residual stenosis in tLAD after Patent LIMA  . Tonsillectomy    . Cataract extraction w/ intraocular lens  implant, bilateral Bilateral   . Coronary artery bypass graft  1998    SVG-diagonal, DVG-OM, DVG-PDA, LIMA-LAD  . Esophagogastroduodenoscopy N/A 06/29/2013    Procedure: ESOPHAGOGASTRODUODENOSCOPY (EGD);  Surgeon: Beryle Beams, MD;  Location: [facility] ENDOSCOPY;  Service: Endoscopy;  Laterality: N/A;  . Left heart catheterization with coronary/graft angiogram N/A 01/13/2012    Procedure: LEFT HEART CATHETERIZATION WITH Beatrix Fetters;  Surgeon: Lorretta Harp, MD;  Location: [facility]  CATH LAB;  Service: Cardiovascular;  Laterality: N/A;  . Left heart catheterization with coronary/graft angiogram N/A 06/28/2013    Procedure: LEFT HEART CATHETERIZATION WITH Beatrix Fetters;  Surgeon: Troy Sine, MD;  Location: [facility] CATH LAB;  Service: Cardiovascular;  Laterality: N/A;    Interval History: Doing well - no complaints.  Walks ~ 1 hr every day as well as intermittent workout routines throughout the week. 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 He notes his usual "irregular heartbeat".  Occasional dizziness with standing up after bending down (too fast).  Otherwise essentially stable cardiac standpoint.  No chest pain or shortness of breath with rest or exertion. Will get DOE with walking up hill -- ok walking on flat. No PND, orthopnea wth maybe a little beit of intermittent edema.  No palpitations, lightheadedness, dizziness, weakness or syncope/near syncope. No TIA/amaurosis fugax symptoms.  ROS: A comprehensive was performed. Review of Systems  Constitutional: Positive for weight loss (Has been working hard on his diet and exercise.).  HENT: Negative for nosebleeds.   Cardiovascular: Negative.  Negative for claudication.       Per HPI  Gastrointestinal: Negative for melena.  Genitourinary: Negative for hematuria.  Neurological: Negative for dizziness.  Endo/Heme/Allergies: Does not bruise/bleed easily.  All other systems reviewed and are negative.   Current Outpatient Prescriptions on File Prior to Visit  Medication Sig Dispense Refill  . amLODipine-benazepril (LOTREL) 10-20 MG per capsule TAKE ONE CAPSULE BY MOUTH ONCE DAILY 90 capsule 0  . atenolol (TENORMIN) 50 MG tablet TAKE ONE TABLET BY MOUTH ONCE DAILY 90 tablet 0  . hydrochlorothiazide (MICROZIDE) 12.5 MG capsule Take 1 capsule (12.5 mg total) by mouth daily. 30 capsule 12  . isosorbide mononitrate (IMDUR) 30 MG 24 hr tablet TAKE ONE TABLET BY MOUTH ONCE DAILY 30 tablet 6  . nitroGLYCERIN  (NITROSTAT) 0.4 MG SL tablet Place 1 tablet (0.4 mg total) under the tongue every 5 (five) minutes as needed (up to 3 doses). 25 tablet 3  . pantoprazole (PROTONIX) 40 MG tablet Take 1 tablet (40 mg total) by mouth daily. 30 tablet 12  . pravastatin (PRAVACHOL) 40 MG tablet TAKE ONE TABLET BY MOUTH ONCE DAILY 90 tablet 0  . ticagrelor (BRILINTA) 90 MG TABS tablet Take 1 tablet (90 mg total) by mouth 2 (two) times daily. 60 tablet 12   No current facility-administered medications on file prior to visit.   Allergies  Allergen Reactions  . Keflex [Cephalexin] Other (See Comments)    Unknown   . Lipitor [Atorvastatin] Other (See Comments)    Elevated blood sugar  . Sulfa Antibiotics Nausea Only  . Zocor [Simvastatin] Other (See Comments)    Unknown     History  Substance Use Topics  . Smoking status: Former Smoker -- 2.00 packs/day for 30 years    Types: Cigarettes    Quit date: 03/02/1990  . Smokeless tobacco: Never Used  .  Alcohol Use: Yes     Comment: 06/28/2013 "aien't drank in years; used to drink a little beer"   Family History  Problem Relation Age of Onset  . Cancer Mother   . Heart attack Father     Wt Readings from Last 3 Encounters:  08/28/14 77.656 kg (171 lb 3.2 oz)  10/30/13 80.377 kg (177 lb 3.2 oz)  08/25/13 80.74 kg (178 lb)    PHYSICAL EXAM BP 120/66 mmHg  Pulse 60  Ht 5\' 11"  (1.803 m)  Wt 77.656 kg (171 lb 3.2 oz)  BMI 23.89 kg/m2 General: He is a pleasant, healthy-appearing gentleman. He looks younger than his stated age. He is in no acute distress, alert and oriented x3, answers questions appropriately.  HEENT: NCAT, EOMI, MMM, anicteric sclerae.  Neck: Supple; no LAN, JVD, with soft carotid bruits bilaterally, more notable on the right.  Heart: RRR, normal S1, S2. No M/R/G.  Lungs: CTAB,  nonlabored, normal effort, good air movement.  Abdomen: Soft/NT/ND/NABS. No HSM. Extremities: No C/C/E, 2+ equal pulses throughout. Strength 5/5 throughout.       Adult ECG Report  Rate: 60 ;  Rhythm: normal sinus rhythm, premature ventricular contractions (PVC) and Left axis deviation, low voltage. Poor R-wave progression in septal leads suggesting possible old septal infarct.  Narrative Interpretation: Relatively stable EKG from June 2015  Recent Labs:  None since last year   Other studies Reviewed: Additional studies/ records that were reviewed today include: No new studies   ASSESSMENT / PLAN: Problem List Items Addressed This Visit    CAD, multiple vessel;  CABG in 1998; BMS- SVG-OM with cutting balloon to diag in 2003; DES to Seq SVG-OM2-3 (2013); DES - Prox SVG-OM2-3 & PTCA of ISR in Seq limb;; Residual ~80% in LAD post LIMA - Primary (Chronic)    Doing well now over one year post complex PCI. No further anginal symptoms. Nonischemic Myoview.  Currently on 90 mg of Brilinta --> at next visit, we will see if we can potentially switching to the 60 mg maintenance dose.   He never did try to wean himself off the Imdur. His Gist currently taking it every day to avoid needing to take nitroglycerin. On standing dose of statin along with Lotrel.  On pravastatin.      Relevant Orders   EKG 12-Lead (Completed)   Comprehensive metabolic panel (Completed)   Lipid panel (Completed)   Coronary artery disease involving coronary bypass graft of native heart without angina pectoris (Chronic)   Relevant Orders   EKG 12-Lead (Completed)   Comprehensive metabolic panel (Completed)   Lipid panel (Completed)   Dyslipidemia, goal LDL below 70 (Chronic)    Previously well-controlled. Was post to have lipids checked this year with NMR panel. Since we missed the timing, we will just simply check a screening panel now with plans to recheck NMR next year.  Continue pravastatin.      Relevant Orders   EKG 12-Lead (Completed)   Comprehensive metabolic panel (Completed)   Lipid panel (Completed)   Essential hypertension (Chronic)    Well-controlled on  current medications. Continue current regimen.      Relevant Orders   EKG 12-Lead (Completed)   Comprehensive metabolic panel (Completed)   Lipid panel (Completed)   History of: NSTEMI (non-ST elevated myocardial infarction) (Chronic)    Fixed inferoseptal defect noted on Myoview consistent with old infarct. No heart failure symptoms. Preserved EF      Relevant Orders   EKG 12-Lead (Completed)  Comprehensive metabolic panel (Completed)   Lipid panel (Completed)      Current medicines are reviewed at length with the patient today. (+/- concerns) N/A The following changes have been made: No new studies (reviewed Myoview from June 2015) labs/ tests ordered today include:   Orders Placed This Encounter  Procedures  . Comprehensive metabolic panel    Order Specific Question:  Has the patient fasted?    Answer:  Yes  . Lipid panel    Order Specific Question:  Has the patient fasted?    Answer:  Yes  . EKG 12-Lead   No orders of the defined types were placed in this encounter.     PATIENT INSTRUCTIONS: PLEASE DO LABS- CMP ,LIPIDS- DO NOT EAT OR DRINK  THE MORNING OF TEST.  NO CHANGE IN CURRENT MEDICATIONS   Your physician wants you to follow-up in [place] HARDING. If stable in 6 months, will do alternating follow-up with APP and myself   [place]ARD[place], Leonie Green, M.D., M.S. Interventional Cardiologist   Pager # [phone]

## 2014-08-29 ENCOUNTER — Encounter: Payer: Self-pay | Admitting: Cardiology

## 2014-08-29 NOTE — Assessment & Plan Note (Signed)
Previously well-controlled. Was post to have lipids checked this year with NMR panel. Since we missed the timing, we will just simply check a screening panel now with plans to recheck NMR next year.  Continue pravastatin.

## 2014-08-29 NOTE — Assessment & Plan Note (Signed)
Well-controlled on current medications. Continue current regimen.

## 2014-08-29 NOTE — Assessment & Plan Note (Signed)
Fixed inferoseptal defect noted on Myoview consistent with old infarct. No heart failure symptoms. Preserved EF

## 2014-08-29 NOTE — Assessment & Plan Note (Signed)
Doing well now over one year post complex PCI. No further anginal symptoms. Nonischemic Myoview.  Currently on 90 mg of Brilinta --> at next visit, we will see if we can potentially switching to the 60 mg maintenance dose.   He never did try to wean himself off the Imdur. His Gist currently taking it every day to avoid needing to take nitroglycerin. On standing dose of statin along with Lotrel.  On pravastatin.

## 2014-09-24 ENCOUNTER — Other Ambulatory Visit: Payer: Self-pay | Admitting: Cardiology

## 2014-09-28 ENCOUNTER — Telehealth: Payer: Self-pay | Admitting: Cardiology

## 2014-09-28 MED ORDER — ISOSORBIDE MONONITRATE ER 30 MG PO TB24: 30.0000 mg | ORAL_TABLET | Freq: Every day | ORAL | Status: DC

## 2014-09-28 MED ORDER — ATENOLOL 50 MG PO TABS: 50.0000 mg | ORAL_TABLET | Freq: Every day | ORAL | Status: DC

## 2014-09-28 NOTE — Telephone Encounter (Signed)
Mrs. Oats states she requested refills on Mr. Bolander Atenolol and Imdur several days ago and Suzie Portela has not heard back.  He is almost out of medications.

## 2014-09-28 NOTE — Telephone Encounter (Signed)
Refill submitted to patient's preferred pharmacy. E receipt verified. Informed patient. Pt voiced understanding, no other stated concerns at this time.

## 2014-10-11 ENCOUNTER — Other Ambulatory Visit: Payer: Self-pay | Admitting: Cardiology

## 2014-10-11 NOTE — Telephone Encounter (Signed)
Rx request sent to pharmacy.  

## 2014-11-25 ENCOUNTER — Other Ambulatory Visit: Payer: Self-pay | Admitting: Cardiology

## 2014-11-26 NOTE — Telephone Encounter (Signed)
Rx request sent to pharmacy.  

## 2015-03-13 ENCOUNTER — Other Ambulatory Visit: Payer: Self-pay | Admitting: Cardiology

## 2015-03-13 NOTE — Telephone Encounter (Signed)
Rx(s) sent to pharmacy electronically.  

## 2015-03-25 ENCOUNTER — Other Ambulatory Visit: Payer: Self-pay | Admitting: Cardiology

## 2015-03-25 NOTE — Telephone Encounter (Signed)
Rx request sent to pharmacy.  

## 2015-04-13 ENCOUNTER — Other Ambulatory Visit: Payer: Self-pay | Admitting: Cardiology

## 2015-04-16 ENCOUNTER — Telehealth: Payer: Self-pay | Admitting: Cardiology

## 2015-04-16 MED ORDER — AMLODIPINE BESY-BENAZEPRIL HCL 10-20 MG PO CAPS: 1.0000 | ORAL_CAPSULE | Freq: Every day | ORAL | Status: DC

## 2015-04-16 MED ORDER — PRAVASTATIN SODIUM 40 MG PO TABS: 40.0000 mg | ORAL_TABLET | Freq: Every day | ORAL | Status: DC

## 2015-04-16 NOTE — Telephone Encounter (Signed)
Refill sent to the pharmacy electronically.  

## 2015-04-16 NOTE — Telephone Encounter (Signed)
New message  *STAT* If patient is at the pharmacy, call can be transferred to refill team.   1. Which medications need to be refilled? (please list name of each medication and dose if known)  Plavastation 40mg   Amlodeenazepril 10-20mg  2. Which pharmacy/location (including street and city if local pharmacy) is medication to be sent to?  [facility] [place]  3. Do they need a 30 day or 90 day supply?[place]

## 2015-04-25 ENCOUNTER — Other Ambulatory Visit: Payer: Self-pay | Admitting: Cardiology

## 2015-04-25 NOTE — Telephone Encounter (Signed)
Rx(s) sent to pharmacy electronically.  

## 2015-05-12 ENCOUNTER — Other Ambulatory Visit: Payer: Self-pay | Admitting: Cardiology

## 2015-05-13 NOTE — Telephone Encounter (Signed)
REFILL 

## 2015-05-26 ENCOUNTER — Other Ambulatory Visit: Payer: Self-pay | Admitting: Cardiology

## 2015-05-27 ENCOUNTER — Other Ambulatory Visit: Payer: Self-pay | Admitting: *Deleted

## 2015-05-27 MED ORDER — PRAVASTATIN SODIUM 40 MG PO TABS: 40.0000 mg | ORAL_TABLET | Freq: Every day | ORAL | Status: DC

## 2015-06-13 ENCOUNTER — Other Ambulatory Visit: Payer: Self-pay | Admitting: Cardiology

## 2015-06-13 NOTE — Telephone Encounter (Signed)
Rx(s) sent to pharmacy electronically. MD appt 06/18/15

## 2015-06-17 NOTE — Progress Notes (Signed)
PCP: Wyatt Haste, MD  Clinic Note: Chief Complaint  Patient presents with  . Follow-up    pt denied chest pain and SOB, pt denied swelling in ankles, legs and feet  . Coronary Artery Disease    HPI: Xavier Giles is a 75 y.o. male with a PMH below who presents today for delayed 6 month (10 month) f/u for CAD-CABG/PCI. Last seen in Aug 2015 to f/u Myoview to evaluate the significance of the LAD lesion beyond the LIMA-LAD anastomosis.  April 2015: Unstable angina: Cardiac catheterization showing patent LIMA-LAD but progression of disease in the distal LAD to ~80%. Ostial occlusion of SVG-Diag & SVG-RCA. PCI to SVG-CxOM2-OM3 99% prox lesion & PTCA of  95% ISR in sequential limb..  08/24/13: Myoview Stress Test looked good!! Evidence of possible old MI / scar, but nothing to suggest ischemia. Not Gated b/c ectopy .  Xavier Giles was last seen in June 2016  Recent Hospitalizations: none  Studies Reviewed: None  Interval History: He is doing relatively well.  Walks ~ 1hr lmost every morning - but does get "short-winded".  Also notes low energy.  No change in exercise level of exercise tolerance.    No chest pain or shortness of breath with rest or exertion. No PND, orthopnea or edema. No palpitations, lightheadedness, dizziness, weakness or syncope/near syncope. No TIA/amaurosis fugax symptoms. No melena, hematochezia, hematuria, or epstaxis. No claudication.  ROS: A comprehensive was performed. (negative) Review of Systems  Constitutional: Negative for malaise/fatigue.  HENT: Negative for congestion and nosebleeds.   Eyes: Negative for blurred vision and double vision.  Cardiovascular: Negative for leg swelling.  Gastrointestinal: Negative for blood in stool and melena.  Genitourinary: Negative for hematuria.  Musculoskeletal: Positive for joint pain. Negative for falls.  Neurological: Negative for dizziness, speech change, focal weakness, seizures and loss of  consciousness.  Psychiatric/Behavioral: Negative for depression and memory loss. The patient is not nervous/anxious and does not have insomnia.   All other systems reviewed and are negative.   Past Medical History  Diagnosis Date  . CAD, multiple vessel 1998    Referred for CABG  . S/P CABG x 5 1998    LIMA-LAD, SVG-OM2-OM3; SVG-rPDA, SVG-D1  . CAD (coronary artery disease) of bypass graft 2003    + Nuc ST: 100% SVG-rPDA; 80% D1 post SVG; 80% SVG-OM; patent LIMA-LAD;; PCI-SVG-OM (prox limb): 3.0 x 8 [place] BMS- going into native OM2);; PTCA only native D1 through SVG  . Non-ST elevated myocardial infarction (non-STEMI) ([place]) 01/2012    SVG-diagonal occluded, 50% distal left main, 70-80% proximal LAD and 90% mid. Patent LIMA with 70% lesion post anastomosis. SVG to RCA occluded, SVG-OM2-3 90% sequential limb stenosis -- PCI SVG-OM 2 -OM 3: Xience Xpedition DES 2.75 MM x15 MM (3 MM)   . S/P cardiac catheterization 06/28/2013    Cres Angina: a) 60% dLM; 70% Ostial LAD; 90% Cx - no OMs seen; 100% RCA;; SVG-D1 & SVG--dRCA occluded; Patent LIMA-LAD with dLAD ~70-80%;; SVG-OM2-OM3: 95% proximal, 50% mid, Diffuse ISR in BMS-OM2 & DES in Seq limb-OM3;; b) PCI:  prox SVG-OM-OM - Resolute DES 3.0 mm x 26 mm (3.3 mm); PTCA (Angiosculpt) to ISR in Seq limb - ~3.0 mm.; c)  Myoview 08/25/13 - small non-T-mural Inf scar, No Ischemia  . Stented coronary artery 2003; 01/2012; 05/2013    a) '03 BMS SVG-OM2-OM3 (initial limb into OM2);; b) 2013 - DES to [place];; c) 05/2013 Prox SVG--OM2-OM3 Resolute DES  3.0 mm x 26 mm; Angiosculpt PTCA of ISR in Seq Limb (3.0 mm)  . Hypertension, essential   . Dyslipidemia, goal LDL below 70   . Carotid bruit present 01/2012    a. asymptomatic; carotid doppler 01/2012 - R bulb/prox ICA mild-mod amt of fibrous plaque, L subclavian 70-99% diameter reduction, L bulb/ICA mild amt of fibrous plaque  . Chronic low back pain   . Asthma     Past Surgical History    Procedure Laterality Date  . Coronary angioplasty with stent placement  2003    BMS 3.0 mm x 8 mm stent to SVG to OM & cuttin balloon to diagonal; 100% occluded vein graft to PDA, patent LIMA; 80% stenosis in SVG-diagnal; vein graft stenosis in OM  . Tonsillectomy    . Cataract extraction w/ intraocular lens  implant, bilateral Bilateral   . Coronary artery bypass graft  1998    SVG-diagonal, DVG-OM, DVG-PDA, LIMA-LAD  . Esophagogastroduodenoscopy N/A 06/29/2013    Procedure: ESOPHAGOGASTRODUODENOSCOPY (EGD);  Surgeon: Beryle Beams, MD;  Location: [facility] ENDOSCOPY;  Service: Endoscopy;  Laterality: N/A;  . Left heart catheterization with coronary/graft angiogram N/A 01/13/2012    Procedure: LEFT HEART CATHETERIZATION WITH Beatrix Fetters;  Surgeon: Lorretta Harp, MD;  Location: [facility] CATH LAB;  Service: Cardiovascular: PCI & stenting of SVG-OM2 to OM3 segment with Xience Xpedition 2.34mmx15mm (3.27mm) DES  . Left heart catheterization with coronary/graft angiogram N/A 06/28/2013    Procedure: LEFT HEART CATHETERIZATION WITH Beatrix Fetters;  Surgeon: Troy Sine, MD;  Location: [facility] CATH LAB;  Service: Cardiovascular: Patent LIMA-LAD (progression of distal LAD ~80%, SVG-Diag, SVG-RCA 100%. PCI to SVG-CxOM2-OM3 99% prox lesion (Resolute DES 3.0 x 26 - 3.4 mm) & PTCA of  95% ISR in sequential limb (3.0 mm Angiosculpt)   . Nm myoview ltd  07/2013    Test looked good!! Evidence of possible old MI / scar, but nothing to suggest ischemia. Not Gated b/c ectopy .   Prior to Admission medications   Medication Sig Start Date End Date Taking? Authorizing Provider  amLODipine-benazepril (LOTREL) 10-20 MG capsule TAKE ONE CAPSULE BY MOUTH ONCE DAILY. NEED  OFFICE  VISIT. 06/13/15  Yes [name], MD  atenolol (TENORMIN) 50 MG tablet Take 1 tablet (50 mg total) by mouth daily. 09/28/14  Yes [name], MD  hydrochlorothiazide (MICROZIDE) 12.5 MG capsule TAKE ONE CAPSULE BY MOUTH DAILY  06/13/15  Yes [name], MD  isosorbide mononitrate (IMDUR) 30 MG 24 hr tablet Take 1 tablet (30 mg total) by mouth daily. 09/28/14  Yes [name], MD  nitroGLYCERIN (NITROSTAT) 0.4 MG SL tablet Place 1 tablet (0.4 mg total) under the tongue every 5 (five) minutes as needed (up to 3 doses). 06/30/13  Yes Dayna N Dunn, [name]  pantoprazole (PROTONIX) 40 MG tablet Take 1 tablet (40 mg total) by mouth daily. 06/30/13  Yes [name], [name]  pravastatin (PRAVACHOL) 40 MG tablet Take 1 tablet (40 mg total) by mouth daily. 05/27/15  Yes [name], MD  ticagrelor (BRILINTA) 90 MG TABS tablet Take 1 tablet (90 mg total) by mouth 2 (two) times daily. 06/30/13  Yes [name], [name]    Allergies  Allergen Reactions  . Keflex [Cephalexin] Other (See Comments)    Unknown   . Lipitor [Atorvastatin] Other (See Comments)    Elevated blood sugar  . Sulfa Antibiotics Nausea Only  . Zocor [Simvastatin] Other (See Comments)    Unknown  Social History   Social History  . Marital Status: Married    Spouse Name: N/A  . Number of Children: N/A  . Years of Education: N/A   Social History Main Topics  . Smoking status: Former Smoker -- 2.00 packs/day for 30 years    Types: Cigarettes    Quit date: 03/02/1990  . Smokeless tobacco: Never Used  . Alcohol Use: Yes     Comment: 06/28/2013 "aien't drank in years; used to drink a little beer"  . Drug Use: No  . Sexual Activity: No   Other Topics Concern  . None   Social History Narrative   Retired Married father of 2, grandfather tube.  Exercises routinely walking a roughly an hour a day.  Does not smoke does not drink.  He quit smoking many years ago.    Family History  Problem Relation Age of Onset  . Cancer Mother   . Heart attack Father     Wt Readings from Last 3 Encounters:  06/18/15 178 lb 1.6 oz (80.786 kg)  08/28/14 171 lb 3.2 oz (77.656 kg)  10/30/13 177 lb 3.2 oz (80.377 kg)    PHYSICAL EXAM BP 118/66 mmHg   Pulse 67  Ht 5\' 11"  (1.803 m)  Wt 178 lb 1.6 oz (80.786 kg)  BMI 24.85 kg/m2 General: He is a pleasant, healthy-appearing gentleman. He looks younger than his stated age. He is in no acute distress, alert and oriented x3, answers questions appropriately.  HEENT: NCAT, EOMI, MMM, anicteric sclerae.  Neck: Supple; no LAN, JVD, with soft carotid bruits bilaterally, more notable on the right.  Heart: RRR, normal S1, S2. No M/R/G.  Lungs: CTAB, nonlabored, normal effort, good air movement.  Abdomen: Soft/NT/ND/NABS. No HSM. \ Extremities: No C/C/E, 2+ equal pulses throughout. Strength 5/5 throughout. ' Neuro: Grossly intact.    Adult ECG Report  Rate: 67 ;  Rhythm: normal sinus rhythm and Left ICD deviation (-40). Septal MI, age undetermined with IVCD. Borderline low voltage (most notable in precordial leads.;   Narrative Interpretation: Stable EKG with no notable change from June 2016.   Other studies Reviewed: Additional studies/ records that were reviewed today include:  Recent Labs:   Lab Results  Component Value Date   CHOL 134 06/18/2015   HDL 40 06/18/2015   LDLCALC 68 06/18/2015   TRIG 130 06/18/2015   CHOLHDL 3.4 06/18/2015   Lab Results  Component Value Date   ALT 16 06/18/2015   AST 20 06/18/2015   ALKPHOS 56 06/18/2015   BILITOT 0.8 06/18/2015    ASSESSMENT / PLAN: Problem List Items Addressed This Visit    S/P CABG x 3   Relevant Orders   EKG 12-Lead (Completed)   Comprehensive metabolic panel (Completed)   Lipid panel (Completed)   Orthostatic hypotension   Relevant Orders   EKG 12-Lead (Completed)   Comprehensive metabolic panel (Completed)   Lipid panel (Completed)   History of: NSTEMI (non-ST elevated myocardial infarction) - Primary (Chronic)    Fixed defect noted on Myoview consistent with old infarct. No heart failure symptoms. Preserved EF.      Relevant Orders   EKG 12-Lead (Completed)   Comprehensive metabolic panel (Completed)    Lipid panel (Completed)   Essential hypertension (Chronic)    Well-controlled on beta blocker, ACE inhibitor/CCB Combination.      Relevant Orders   EKG 12-Lead (Completed)   Comprehensive metabolic panel (Completed)   Lipid panel (Completed)   Dyslipidemia, goal LDL below 70 (Chronic)  Lipids ordered for today. He is on pravastatin. Results reviewed no excellent control of LDL HDL. Normal LFTs.      Relevant Orders   EKG 12-Lead (Completed)   Comprehensive metabolic panel (Completed)   Lipid panel (Completed)   Coronary artery disease involving coronary bypass graft of native heart without angina pectoris (Chronic)    Multiple interventions on the vein graft to the OM both in the proximal limb in the sequential limb. Didn't seem to be holding up well. Last intervention was in 2015. I would probably consider rechecking a Myoview in 2018 2019.      Relevant Orders   EKG 12-Lead (Completed)   Comprehensive metabolic panel (Completed)   Lipid panel (Completed)   CAD, multiple vessel;  CABG in 1998; BMS- SVG-OM with cutting balloon to diag in 2003; DES to Seq SVG-OM2-3 (2013); DES - Prox SVG-OM2-3 & PTCA of ISR in Seq limb;; Residual ~80% in LAD post LIMA (Chronic)    Stable, doing well without any significant symptoms of angina. Minimal use of sublingual NTG. On Brilinta without aspirin. Continue current dose of Imdur and beta blocker. Continue Lotrel On statin.      Relevant Orders   EKG 12-Lead (Completed)   Comprehensive metabolic panel (Completed)   Lipid panel (Completed)      Current medicines are reviewed at length with the patient today. (+/- concerns) none none The following changes have been made: none  Studies Ordered:   Orders Placed This Encounter  Procedures  . Comprehensive metabolic panel  . Lipid panel  . EKG 12-Lead    ROV 6-7 months.    [name], M.D., M.S. Interventional Cardiologist   Pager # [phone] Phone #  [phone] [address] [place], [place] [address]

## 2015-06-18 ENCOUNTER — Ambulatory Visit (INDEPENDENT_AMBULATORY_CARE_PROVIDER_SITE_OTHER): Payer: Medicare Other | Admitting: Cardiology

## 2015-06-18 VITALS — BP 118/66 | HR 67 | Ht 71.0 in | Wt 178.1 lb

## 2015-06-18 DIAGNOSIS — I10 Essential (primary) hypertension: Secondary | ICD-10-CM

## 2015-06-18 DIAGNOSIS — I951 Orthostatic hypotension: Secondary | ICD-10-CM

## 2015-06-18 DIAGNOSIS — Z951 Presence of aortocoronary bypass graft: Secondary | ICD-10-CM | POA: Diagnosis not present

## 2015-06-18 DIAGNOSIS — E785 Hyperlipidemia, unspecified: Secondary | ICD-10-CM

## 2015-06-18 DIAGNOSIS — I214 Non-ST elevation (NSTEMI) myocardial infarction: Secondary | ICD-10-CM | POA: Diagnosis not present

## 2015-06-18 DIAGNOSIS — I2510 Atherosclerotic heart disease of native coronary artery without angina pectoris: Secondary | ICD-10-CM | POA: Diagnosis not present

## 2015-06-18 DIAGNOSIS — I25810 Atherosclerosis of coronary artery bypass graft(s) without angina pectoris: Secondary | ICD-10-CM | POA: Diagnosis not present

## 2015-06-18 LAB — COMPREHENSIVE METABOLIC PANEL
ALBUMIN: 5 g/dL (ref 3.6–5.1)
ALT: 16 U/L (ref 9–46)
AST: 20 U/L (ref 10–35)
Alkaline Phosphatase: 56 U/L (ref 40–115)
BUN: 19 mg/dL (ref 7–25)
CHLORIDE: 103 mmol/L (ref 98–110)
CO2: 25 mmol/L (ref 20–31)
Calcium: 9.7 mg/dL (ref 8.6–10.3)
Creat: 1.21 mg/dL — ABNORMAL HIGH (ref 0.70–1.18)
Glucose, Bld: 147 mg/dL — ABNORMAL HIGH (ref 65–99)
POTASSIUM: 4.7 mmol/L (ref 3.5–5.3)
SODIUM: 139 mmol/L (ref 135–146)
Total Bilirubin: 0.8 mg/dL (ref 0.2–1.2)
Total Protein: 7.4 g/dL (ref 6.1–8.1)

## 2015-06-18 LAB — LIPID PANEL
CHOL/HDL RATIO: 3.4 ratio (ref <=5.0)
Cholesterol: 134 mg/dL (ref 125–200)
HDL: 40 mg/dL (ref >=40)
LDL CALC: 68 mg/dL (ref <130)
TRIGLYCERIDES: 130 mg/dL (ref <150)
VLDL: 26 mg/dL (ref <30)

## 2015-06-18 NOTE — Patient Instructions (Addendum)
NO CHANGE IN CURRENT MEDICATIONS  LABS - done toay   Your physician wants you to follow-up in [place].  You will receive a reminder letter in the mail two months in advance. If you don't receive a letter, please call our office to schedule the follow-up appointment.   If you need a refill on your cardiac medications before your next appointment, please call your pharmacy.

## 2015-06-19 ENCOUNTER — Encounter: Payer: Self-pay | Admitting: Cardiology

## 2015-06-19 NOTE — Assessment & Plan Note (Signed)
Well-controlled on beta blocker, ACE inhibitor/CCB Combination.

## 2015-06-19 NOTE — Assessment & Plan Note (Signed)
Stable, doing well without any significant symptoms of angina. Minimal use of sublingual NTG. On Brilinta without aspirin. Continue current dose of Imdur and beta blocker. Continue Lotrel On statin.

## 2015-06-19 NOTE — Assessment & Plan Note (Signed)
Multiple interventions on the vein graft to the OM both in the proximal limb in the sequential limb. Didn't seem to be holding up well. Last intervention was in 2015. I would probably consider rechecking a Myoview in 2018 2019.

## 2015-06-19 NOTE — Assessment & Plan Note (Signed)
Fixed defect noted on Myoview consistent with old infarct. No heart failure symptoms. Preserved EF.

## 2015-06-19 NOTE — Assessment & Plan Note (Signed)
Lipids ordered for today. He is on pravastatin. Results reviewed no excellent control of LDL HDL. Normal LFTs.

## 2015-07-04 ENCOUNTER — Telehealth: Payer: Self-pay | Admitting: *Deleted

## 2015-07-04 ENCOUNTER — Encounter: Payer: Self-pay | Admitting: *Deleted

## 2015-07-04 NOTE — Telephone Encounter (Signed)
-----   Message from [name], MD sent at 07/01/2015  7:35 PM EDT ----- Glucose is a little high. Otherwise chemistry panel is good. Normal liver function. Kidney function makes it likely is a little bit dry, otherwise stable. Cholesterol levels look great. Pretty much stable and at goal. No changes.   [name], MD

## 2015-07-04 NOTE — Telephone Encounter (Signed)
Spoke to patient's wife. Result given . Verbalized understanding Informed patient's wife to contact primary about blood glucose She states he has not been in a [place] time , but she will let him know  results mailed to patient

## 2015-07-06 ENCOUNTER — Other Ambulatory Visit: Payer: Self-pay | Admitting: Cardiology

## 2015-07-08 NOTE — Telephone Encounter (Signed)
Rx request sent to pharmacy.  

## 2015-07-11 ENCOUNTER — Telehealth: Payer: Self-pay | Admitting: Cardiology

## 2015-07-11 ENCOUNTER — Other Ambulatory Visit: Payer: Self-pay

## 2015-07-11 NOTE — Telephone Encounter (Signed)
New Message   *STAT* If patient is at the pharmacy, call can be transferred to refill team.   1. Which medications need to be refilled? (please list name of each medication and dose if known) Brilanta   2. Which pharmacy/location (including street and city if local pharmacy) is medication to be sent to? [facility]   3. Do they need a 30 day or 90 day supply? 90   [facility]  [phone]

## 2015-07-12 MED ORDER — TICAGRELOR 90 MG PO TABS: 90.0000 mg | ORAL_TABLET | Freq: Two times a day (BID) | ORAL | Status: DC

## 2015-07-12 NOTE — Telephone Encounter (Signed)
LEFT MESSAGE TO CALL BACK

## 2015-07-12 NOTE — Telephone Encounter (Signed)
Follow Up ° °Pt returned call//  °

## 2015-07-12 NOTE — Telephone Encounter (Signed)
SPOKE TO WIFE  INFORMED HER THAT REFILL REQUEST ARRIVED FROM [employer] FOR BRILINTA  AWAITING FOR SIGNATURE WIFE AWARE IT MAYBE UNTIL 07/25/15. SAMPLES AVAILABLE FOR PICK UP  SHE VERBALIZED UNDERSTANDING

## 2015-07-12 NOTE — Telephone Encounter (Signed)
Appears patient needs printed Rx for Brilinta patient assistance. Message routed to [place], [place]

## 2015-08-01 ENCOUNTER — Telehealth: Payer: Self-pay | Admitting: Cardiology

## 2015-08-01 NOTE — Telephone Encounter (Signed)
New Message   Pt wife requesting a call from the RN. Pt wife states a form was never fax/ received for waver for medication [employer]

## 2015-08-02 MED ORDER — TICAGRELOR 90 MG PO TABS: 90.0000 mg | ORAL_TABLET | Freq: Two times a day (BID) | ORAL | Status: DC

## 2015-08-02 NOTE — Telephone Encounter (Signed)
spoke to wife informed  Re enrollment information faxed to [employer]   for BRILINTA 90 MG TWICE A DAY  samples available for pick up as well  wife verbalized understanding.

## 2015-08-06 NOTE — Telephone Encounter (Signed)
New Message  Pt wife requesting to speak with RN about Re enrollment information that was faxed to [employer] [employer]  for BRILINTA 90 MG TWICE A DAY. Pt wife stating the information was not fax. Please call back to discuss

## 2015-08-06 NOTE — Telephone Encounter (Signed)
Spoke with pt wife, aware we have faxed several times and are having trouble getting fax to go through. Paperwork re-faxed.

## 2015-08-14 ENCOUNTER — Other Ambulatory Visit: Payer: Self-pay | Admitting: Cardiology

## 2015-08-19 ENCOUNTER — Telehealth: Payer: Self-pay | Admitting: *Deleted

## 2015-08-19 MED ORDER — TICAGRELOR 90 MG PO TABS: 90.0000 mg | ORAL_TABLET | Freq: Two times a day (BID) | ORAL | Status: DC

## 2015-08-19 NOTE — Telephone Encounter (Signed)
Rx for Brilinta printed for patient assistance program

## 2015-08-24 ENCOUNTER — Other Ambulatory Visit: Payer: Self-pay | Admitting: Cardiology

## 2015-08-26 NOTE — Telephone Encounter (Signed)
Rx request sent to pharmacy.  

## 2015-09-19 ENCOUNTER — Other Ambulatory Visit: Payer: Self-pay | Admitting: Cardiology

## 2015-10-07 ENCOUNTER — Other Ambulatory Visit: Payer: Self-pay | Admitting: Cardiology

## 2015-10-24 DIAGNOSIS — I872 Venous insufficiency (chronic) (peripheral): Secondary | ICD-10-CM | POA: Diagnosis not present

## 2015-10-24 DIAGNOSIS — L821 Other seborrheic keratosis: Secondary | ICD-10-CM | POA: Diagnosis not present

## 2015-10-24 DIAGNOSIS — I8310 Varicose veins of unspecified lower extremity with inflammation: Secondary | ICD-10-CM | POA: Diagnosis not present

## 2015-10-24 DIAGNOSIS — L309 Dermatitis, unspecified: Secondary | ICD-10-CM | POA: Diagnosis not present

## 2016-01-09 ENCOUNTER — Other Ambulatory Visit: Payer: Self-pay | Admitting: Cardiology

## 2016-01-10 ENCOUNTER — Ambulatory Visit (INDEPENDENT_AMBULATORY_CARE_PROVIDER_SITE_OTHER): Payer: Medicare Other | Admitting: Cardiology

## 2016-01-10 ENCOUNTER — Encounter: Payer: Self-pay | Admitting: Cardiology

## 2016-01-10 DIAGNOSIS — I2510 Atherosclerotic heart disease of native coronary artery without angina pectoris: Secondary | ICD-10-CM | POA: Diagnosis not present

## 2016-01-10 DIAGNOSIS — I25810 Atherosclerosis of coronary artery bypass graft(s) without angina pectoris: Secondary | ICD-10-CM | POA: Diagnosis not present

## 2016-01-10 DIAGNOSIS — I10 Essential (primary) hypertension: Secondary | ICD-10-CM

## 2016-01-10 DIAGNOSIS — E785 Hyperlipidemia, unspecified: Secondary | ICD-10-CM | POA: Diagnosis not present

## 2016-01-10 NOTE — Patient Instructions (Signed)
NO CHANGES CURRENT MEDICATIONS   Your physician wants you to follow-up in: [place]. You will receive a reminder letter in the mail two months in advance. If you don't receive a letter, please call our office to schedule the follow-up appointment.   If you need a refill on your cardiac medications before your next appointment, please call your pharmacy.

## 2016-01-10 NOTE — Progress Notes (Signed)
PCP: No primary care provider on file.  Clinic Note: Chief Complaint  Patient presents with  . Coronary Artery Disease    History of non-STEMI, CABG and PCI    HPI: Xavier Giles is a 75 y.o. male with a PMH below who presents today for delayed 6 month (10 month) f/u for CAD-CABG/PCI. Last seen in Aug 2015 to f/u Myoview to evaluate the significance of the LAD lesion beyond the LIMA-LAD anastomosis.  April 2015: Unstable angina: Cardiac catheterization showing patent LIMA-LAD but progression of disease in the distal LAD to ~80%. Ostial occlusion of SVG-Diag & SVG-RCA. PCI to SVG-CxOM2-OM3 99% prox lesion & PTCA of  95% ISR in sequential limb..  08/24/13: Myoview Stress Test looked good!! Evidence of possible old MI / scar, but nothing to suggest ischemia. Not Gated b/c ectopy .  Xavier Giles was last seen in April 2017 -  Recent Hospitalizations: none  Studies Reviewed: None  Interval History: He is doing relatively well.  Walks ~ 1hr lmost every morning - but does get "short-winded".  Also notes low energy.  No change in exercise level of exercise tolerance.  He seems to be relatively stable overall from a cardiac standpoint. He has trace edema but no PND or orthopnea. He continues to deny any chest tightness or pressure with rest or exertion, but does have exertional dyspnea. Energy is improved overall. No PND, orthopnea or edema. No palpitations, lightheadedness, dizziness, weakness or syncope/near syncope. No TIA/amaurosis fugax symptoms. No claudication.  ROS: A comprehensive was performed. (negative) Review of Systems  Constitutional: Negative for malaise/fatigue (occasionally gets tired after exertion).  HENT: Negative for congestion and nosebleeds.   Eyes: Positive for blurred vision (occasionally). Negative for double vision.       Reading glasses only  Respiratory: Positive for cough (occasional ) and shortness of breath (with prolonged exertion). Negative for  wheezing (?? allergies - no real rhyme or reason).   Cardiovascular: Negative for leg swelling.  Gastrointestinal: Negative for blood in stool and melena.  Genitourinary: Negative for hematuria.  Musculoskeletal: Positive for joint pain (mostly hands & hips wtih walking. ). Negative for falls.  Neurological: Negative for dizziness, speech change, focal weakness, seizures and loss of consciousness.  Endo/Heme/Allergies: Positive for environmental allergies.  Psychiatric/Behavioral: Negative for depression and memory loss. The patient is not nervous/anxious and does not have insomnia.   All other systems reviewed and are negative.   Past Medical History:  Diagnosis Date  . Asthma   . 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 CAD, multiple vessel 1998   Referred for CABG  . Carotid bruit present 01/2012   a. asymptomatic; carotid doppler 01/2012 - R bulb/prox ICA mild-mod amt of fibrous plaque, L subclavian 70-99% diameter reduction, L bulb/ICA mild amt of fibrous plaque  . Chronic low back pain   . Dyslipidemia, goal LDL below 70   . Hypertension, essential   . Non-ST elevated myocardial infarction (non-STEMI) ([place]) 01/2012   SVG-diagonal occluded, 50% distal left main, 70-80% proximal LAD and 90% mid. Patent LIMA with 70% lesion post anastomosis. SVG to RCA occluded, SVG-OM2-3 90% sequential limb stenosis -- PCI SVG-OM 2 -OM 3: Xience Xpedition DES 2.75 MM x15 MM (3 MM)   . S/P CABG x 5 1998  LIMA-LAD, SVG-OM2-OM3; SVG-rPDA, SVG-D1  . S/P cardiac catheterization 06/28/2013   Cres Angina: a) 60% dLM; 70% Ostial LAD; 90% Cx - no OMs seen; 100% RCA;; SVG-D1 & SVG--dRCA occluded; Patent LIMA-LAD with dLAD ~70-80%;; SVG-OM2-OM3: 95% proximal, 50% mid, Diffuse ISR in BMS-OM2 & DES in Seq limb-OM3;; b) PCI:  prox  SVG-OM-OM - Resolute DES 3.0 mm x 26 mm (3.3 mm); PTCA (Angiosculpt) to ISR in Seq limb - ~3.0 mm.; c)  Myoview 08/25/13 - small non-T-mural Inf scar, No Ischemia  . Stented coronary artery 2003; 01/2012; 05/2013   a) '03 BMS SVG-OM2-OM3 (initial limb into OM2);; b) 2013 - DES to Seq Lmg SVG-OM2-OM3 - Xience DES;; c) 05/2013 Prox SVG--OM2-OM3 Resolute DES 3.0 mm x 26 mm; Angiosculpt PTCA of ISR in Seq Limb (3.0 mm)    Past Surgical History:  Procedure Laterality Date  . CATARACT EXTRACTION W/ INTRAOCULAR LENS  IMPLANT, BILATERAL Bilateral   . CORONARY ANGIOPLASTY WITH STENT PLACEMENT  2003   BMS 3.0 mm x 8 mm stent to SVG to OM & cuttin balloon to diagonal; 100% occluded vein graft to PDA, patent LIMA; 80% stenosis in SVG-diagnal; vein graft stenosis in OM  . CORONARY ARTERY BYPASS GRAFT  1998   SVG-diagonal, DVG-OM, DVG-PDA, LIMA-LAD  . ESOPHAGOGASTRODUODENOSCOPY N/A 06/29/2013   Procedure: ESOPHAGOGASTRODUODENOSCOPY (EGD);  Surgeon: Beryle Beams, MD;  Location: [facility] ENDOSCOPY;  Service: Endoscopy;  Laterality: N/A;  . LEFT HEART CATHETERIZATION WITH CORONARY/GRAFT ANGIOGRAM N/A 01/13/2012   Procedure: LEFT HEART CATHETERIZATION WITH Beatrix Fetters;  Surgeon: Lorretta Harp, MD;  Location: [facility] CATH LAB;  Service: Cardiovascular: PCI & stenting of SVG-OM2 to OM3 segment with Xience Xpedition 2.45mmx15mm (3.70mm) DES  . LEFT HEART CATHETERIZATION WITH CORONARY/GRAFT ANGIOGRAM N/A 06/28/2013   Procedure: LEFT HEART CATHETERIZATION WITH Beatrix Fetters;  Surgeon: Troy Sine, MD;  Location: [facility] CATH LAB;  Service: Cardiovascular: Patent LIMA-LAD (progression of distal LAD ~80%, SVG-Diag, SVG-RCA 100%. PCI to SVG-CxOM2-OM3 99% prox lesion (Resolute DES 3.0 x 26 - 3.4 mm) & PTCA of  95% ISR in sequential limb (3.0 mm Angiosculpt)   . NM MYOVIEW LTD  07/2013   Test looked good!! Evidence of possible old MI / scar, but nothing to suggest ischemia. Not Gated b/c ectopy .  [name] TONSILLECTOMY       Prior to Admission medications   Medication Sig Start Date End Date Taking? Authorizing Provider  amLODipine-benazepril (LOTREL) 10-20 MG capsule TAKE ONE CAPSULE BY MOUTH ONCE DAILY. NEED  OFFICE  VISIT. 06/13/15  Yes [name], MD  atenolol (TENORMIN) 50 MG tablet Take 1 tablet (50 mg total) by mouth daily. 09/28/14  Yes [name], MD  hydrochlorothiazide (MICROZIDE) 12.5 MG capsule TAKE ONE CAPSULE BY MOUTH DAILY 06/13/15  Yes [name], MD  isosorbide mononitrate (IMDUR) 30 MG 24 hr tablet Take 1 tablet (30 mg total) by mouth daily. 09/28/14  Yes [name], MD  nitroGLYCERIN (NITROSTAT) 0.4 MG SL tablet Place 1 tablet (0.4 mg total) under the tongue every 5 (five) minutes as needed (up to 3 doses). 06/30/13  Yes Dayna N Dunn, [name]  pantoprazole (PROTONIX) 40 MG tablet Take 1 tablet (40 mg total) by mouth daily. 06/30/13  Yes [name], [name]  pravastatin (PRAVACHOL) 40 MG tablet Take 1 tablet (40 mg total) by mouth daily. 05/27/15  Yes [name], MD  ticagrelor (BRILINTA) 90 MG TABS tablet Take 1 tablet (90 mg total) by mouth 2 (two) times daily.  06/30/13  Yes [name], [name]    Allergies  Allergen Reactions  . Keflex [Cephalexin] Other (See Comments)    Unknown   . Lipitor [Atorvastatin] Other (See Comments)    Elevated blood sugar  . Sulfa Antibiotics Nausea Only  . Zocor [Simvastatin] Other (See Comments)    Unknown      Social History   Social History  . Marital status: Married    Spouse name: N/A  . Number of children: N/A  . Years of education: N/A   Social History Main Topics  . Smoking status: Former Smoker    Packs/day: 2.00    Years: 30.00    Types: Cigarettes    Quit date: 03/02/1990  . Smokeless tobacco: Never Used  . Alcohol use Yes     Comment: 06/28/2013 "aien't drank in years; used to drink a little beer"  . Drug use: No  . Sexual activity: No   Other Topics Concern  . None   Social History Narrative   Retired  Married father of 2, grandfather tube.  Exercises routinely walking a roughly an hour a day.  Does not smoke does not drink.  He quit smoking many years ago.    Family History  Problem Relation Age of Onset  . Cancer Mother   . Heart attack Father     Wt Readings from Last 3 Encounters:  01/10/16 80.4 kg (177 lb 3.2 oz)  06/18/15 80.8 kg (178 lb 1.6 oz)  08/28/14 77.7 kg (171 lb 3.2 oz)    PHYSICAL EXAM BP 140/74   Pulse 70   Ht 5\' 11"  (1.803 m)   Wt 80.4 kg (177 lb 3.2 oz)   BMI 24.71 kg/m  General: He is a pleasant, healthy-appearing gentleman. He looks younger than his stated age. He is in no acute distress, alert and oriented x3, answers questions appropriately.  HEENT: NCAT, EOMI, MMM, anicteric sclerae.  Neck: Supple; no LAN, JVD, with soft carotid bruits bilaterally, more notable on the right.  Heart: RRR, normal S1, S2. No M/R/G.  Lungs: CTAB, nonlabored, normal effort, good air movement.  Abdomen: Soft/NT/ND/NABS. No HSM. \ Extremities: No C/C/E, 2+ equal pulses throughout. Strength 5/5 throughout. ' Neuro: Grossly intact.    Adult ECG Report Not done  Other studies Reviewed: Additional studies/ records that were reviewed today include:  Recent Labs:   Lab Results  Component Value Date   CHOL 134 06/18/2015   HDL 40 06/18/2015   LDLCALC 68 06/18/2015   TRIG 130 06/18/2015   CHOLHDL 3.4 06/18/2015   Lab Results  Component Value Date   ALT 16 06/18/2015   AST 20 06/18/2015   ALKPHOS 56 06/18/2015   BILITOT 0.8 06/18/2015    ASSESSMENT / PLAN: Problem List Items Addressed This Visit    CAD, multiple vessel;  CABG in 1998; BMS- SVG-OM with cutting balloon to diag in 2003; DES to Seq SVG-OM2-3 (2013); DES - Prox SVG-OM2-3 & PTCA of ISR in Seq limb;; Residual ~80% in LAD post LIMA (Chronic)    Stable. Doing well with no active angina symptoms. Minimal if any use of sublingual nitroglycerin, since the addition of low-dose Imdur Continue Brilinta alone  without aspirin along with atenolol, combine ACE inhibitor/cast channel blocker and statin.       Essential hypertension (Chronic)    Fairly well controlled on current medications. Continue to monitor. Would allow for maybe mild permissive hypertension due to his history of orthostatic hypotension in the past.  Coronary artery disease involving coronary bypass graft of native heart without angina pectoris (Chronic)    He has had several interventions of the SVG-OM. Plan was to recheck Myoview in late 2018.  Continue current medications      Dyslipidemia, goal LDL below 70 (Chronic)    Blood work from April showed well-controlled lipids. Will be due for recheck at about the time of his 6 month follow-up.         Current medicines are reviewed at length with the patient today. (+/- concerns) none none The following changes have been made: none Patient Instructions  NO CHANGES CURRENT MEDICATIONS   Your physician wants you to follow-up in: [place]. You will receive a reminder letter in the mail two months in advance. If you don't receive a letter, please call our office to schedule the follow-up appointment.   If you need a refill on your cardiac medications before your next appointment, please call your pharmacy.  Studies Ordered:   No orders of the defined types were placed in this encounter.     Glenetta Hew, M.D., M.S. Interventional Cardiologist   Pager # [phone] Phone # [phone] [address] [place], [place] [address]

## 2016-01-12 ENCOUNTER — Encounter: Payer: Self-pay | Admitting: Cardiology

## 2016-01-12 NOTE — Assessment & Plan Note (Addendum)
Blood work from April showed well-controlled lipids. Will be due for recheck at about the time of his 6 month follow-up.

## 2016-01-12 NOTE — Assessment & Plan Note (Signed)
Stable. Doing well with no active angina symptoms. Minimal if any use of sublingual nitroglycerin, since the addition of low-dose Imdur Continue Brilinta alone without aspirin along with atenolol, combine ACE inhibitor/cast channel blocker and statin.

## 2016-01-12 NOTE — Assessment & Plan Note (Signed)
He has had several interventions of the SVG-OM. Plan was to recheck Myoview in late 2018.  Continue current medications

## 2016-01-12 NOTE — Assessment & Plan Note (Addendum)
Fairly well controlled on current medications. Continue to monitor. Would allow for maybe mild permissive hypertension due to his history of orthostatic hypotension in the past.

## 2016-02-29 ENCOUNTER — Other Ambulatory Visit: Payer: Self-pay | Admitting: Cardiology

## 2016-03-06 ENCOUNTER — Telehealth: Payer: Self-pay | Admitting: Cardiology

## 2016-03-06 NOTE — Telephone Encounter (Signed)
Informed patient samples available for pickup. Notes that he has enough tabs to last through weekend. I advised if he feels roads unsafe to drive, OK to pick up Monday. Understanding and thanks verbalized. No further concerns.

## 2016-03-06 NOTE — Telephone Encounter (Signed)
Patient calling the office for samples of medication: ° ° °1.  What medication and dosage are you requesting samples for? Brilinta 90 mg ° °2.  Are you currently out of this medication? Yes ° ° ° ° °

## 2016-03-26 ENCOUNTER — Other Ambulatory Visit: Payer: Self-pay | Admitting: Cardiology

## 2016-03-26 NOTE — Telephone Encounter (Signed)
Rx request sent to pharmacy.  

## 2016-06-13 ENCOUNTER — Other Ambulatory Visit: Payer: Self-pay | Admitting: Cardiology

## 2016-07-07 ENCOUNTER — Ambulatory Visit (INDEPENDENT_AMBULATORY_CARE_PROVIDER_SITE_OTHER): Payer: Medicare Other | Admitting: Cardiology

## 2016-07-07 ENCOUNTER — Encounter: Payer: Self-pay | Admitting: Cardiology

## 2016-07-07 VITALS — BP 110/60 | HR 59 | Ht 71.0 in | Wt 173.0 lb

## 2016-07-07 DIAGNOSIS — I208 Other forms of angina pectoris: Secondary | ICD-10-CM | POA: Diagnosis not present

## 2016-07-07 DIAGNOSIS — I25810 Atherosclerosis of coronary artery bypass graft(s) without angina pectoris: Secondary | ICD-10-CM | POA: Diagnosis not present

## 2016-07-07 DIAGNOSIS — E785 Hyperlipidemia, unspecified: Secondary | ICD-10-CM

## 2016-07-07 DIAGNOSIS — I25709 Atherosclerosis of coronary artery bypass graft(s), unspecified, with unspecified angina pectoris: Secondary | ICD-10-CM

## 2016-07-07 DIAGNOSIS — I10 Essential (primary) hypertension: Secondary | ICD-10-CM

## 2016-07-07 DIAGNOSIS — I214 Non-ST elevation (NSTEMI) myocardial infarction: Secondary | ICD-10-CM | POA: Diagnosis not present

## 2016-07-07 DIAGNOSIS — I2510 Atherosclerotic heart disease of native coronary artery without angina pectoris: Secondary | ICD-10-CM | POA: Diagnosis not present

## 2016-07-07 LAB — COMPREHENSIVE METABOLIC PANEL
ALBUMIN: 4.6 g/dL (ref 3.6–5.1)
ALT: 11 U/L (ref 9–46)
AST: 15 U/L (ref 10–35)
Alkaline Phosphatase: 68 U/L (ref 40–115)
BILIRUBIN TOTAL: 0.6 mg/dL (ref 0.2–1.2)
BUN: 18 mg/dL (ref 7–25)
CALCIUM: 9.6 mg/dL (ref 8.6–10.3)
CHLORIDE: 104 mmol/L (ref 98–110)
CO2: 28 mmol/L (ref 20–31)
Creat: 1.06 mg/dL (ref 0.70–1.18)
GLUCOSE: 142 mg/dL — AB (ref 65–99)
POTASSIUM: 4.7 mmol/L (ref 3.5–5.3)
Sodium: 141 mmol/L (ref 135–146)
Total Protein: 7.1 g/dL (ref 6.1–8.1)

## 2016-07-07 LAB — LIPID PANEL
CHOL/HDL RATIO: 3.1 ratio (ref <5.0)
Cholesterol: 127 mg/dL (ref <200)
HDL: 41 mg/dL (ref >40)
LDL CALC: 62 mg/dL (ref <100)
Triglycerides: 120 mg/dL (ref <150)
VLDL: 24 mg/dL (ref <30)

## 2016-07-07 MED ORDER — ISOSORBIDE MONONITRATE ER 60 MG PO TB24: 60.0000 mg | ORAL_TABLET | Freq: Every day | ORAL | 3 refills | Status: DC

## 2016-07-07 MED ORDER — HYDROCHLOROTHIAZIDE 12.5 MG PO CAPS: 12.5000 mg | ORAL_CAPSULE | ORAL | 11 refills | Status: DC | PRN

## 2016-07-07 NOTE — Progress Notes (Signed)
PCP: Patient, No Pcp Per  Clinic Note: Chief Complaint  Patient presents with  . Follow-up    pt has no complaints today  . Coronary Artery Disease    stable angina    HPI: Xavier Giles is a 76 y.o. male with a PMH below who presents today for 6 month f/u opc CAD-CABG-PCI with stable angina.[name]  April 2015: Unstable angina: Cardiac catheterization showing patent LIMA-LAD but progression of disease in the distal LAD to ~80%. Ostial occlusion of SVG-Diag & SVG-RCA. PCI to SVG-CxOM2-OM3 99% prox lesion & PTCA of 95% ISR in sequential limb.. 08/24/13: Myoview Stress Test looked good!! Evidence of possible old MI / scar, but nothing to suggest ischemia. Not Gated b/c ectopy .  Xavier Giles was last seen on 01/10/2016 - He stated that he was doing relatively well. Walks ~ 1hr lmost every morning - but does get "short-winded". Also notes low energy. No change in exercise level of exercise tolerance. He seems to be relatively stable overall from a cardiac standpoint. He has trace edema but no PND or orthopnea. He continues to deny any chest tightness or pressure with rest or exertion, but does have exertional dyspnea. Energy is improved overall.  Recent Hospitalizations: None  Studies Personally Reviewed - if available, images/films reviewed: From Epic Chart or Care Everywhere   None  Interval History: Xavier Giles presents today relatively stable overall. He states it he still gets a little bit tired and short of breath with significant exertion. He will get short of breath he goes uphill. And if he pushes it walking fast uphill he may even noticeable of the chest tightness associated with it. As Xavier Giles as he doesn't "push it, he does not have any chest discomfort. He will get short of breath he goes upstairs for prolonged period time. Walking on flat ground or with minimal hills, he is able to walk about a mile without problems. He walked for 4 hours this morning. He says he can plantar does garden  for about 10 minutes and then would stop and rest. He does note occasional wheezing and coughing, but at this point is more related to allergies or the pollen.   Otherwise, he denies any PND, orthopnea or edema.  No palpitations, lightheadedness, dizziness, weakness or syncope/near syncope. - occasionally dizzy after bending over & standing back up. No TIA/amaurosis fugax symptoms. No melena, hematochezia, hematuria, or epstaxis. No claudication.  ROS: A comprehensive was performed. Pertinent positives and negatives in history of present illness. Review of Systems  Constitutional: Negative for malaise/fatigue (Will occasionally get tired with exertion.).  HENT: Positive for congestion.   Eyes: Positive for blurred vision (Probably needs to have new glasses or up-to-date eye exam).  Respiratory: Positive for cough and wheezing.   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 Negative for falls and myalgias.  Endo/Heme/Allergies: Positive for environmental allergies.  Psychiatric/Behavioral: Negative.   All other systems reviewed and are negative.   I have reviewed and (if needed) personally updated the patient's problem list, medications, allergies, past medical and surgical history, social and family history.   Past Medical History:  Diagnosis Date  . Asthma   . CAD (coronary artery disease) of bypass graft 2003   + Nuc ST: 100% SVG-rPDA; 80% D1 post SVG; 80% SVG-OM; patent LIMA-LAD;; PCI-SVG-OM (prox limb): 3.0 x  8 Zeta BMS- going into native OM2);; PTCA only native D1 through SVG  . CAD, multiple vessel 1998   Referred for CABG  . Carotid bruit present 01/2012   a. asymptomatic; carotid doppler 01/2012 - R bulb/prox ICA mild-mod amt of fibrous plaque, L subclavian 70-99% diameter reduction, L bulb/ICA mild amt of fibrous plaque  . Chronic low back  pain   . Dyslipidemia, goal LDL below 70   . Hypertension, essential   . Non-ST elevated myocardial infarction (non-STEMI) ([place]) 01/2012   SVG-diagonal occluded, 50% distal left main, 70-80% proximal LAD and 90% mid. Patent LIMA with 70% lesion post anastomosis. SVG to RCA occluded, SVG-OM2-3 90% sequential limb stenosis -- PCI SVG-OM 2 -OM 3: Xience Xpedition DES 2.75 MM x15 MM (3 MM)   . S/P CABG x 5 1998   LIMA-LAD, SVG-OM2-OM3; SVG-rPDA, SVG-D1  . S/P cardiac catheterization 06/28/2013   Cres Angina: a) 60% dLM; 70% Ostial LAD; 90% Cx - no OMs seen; 100% RCA;; SVG-D1 & SVG--dRCA occluded; Patent LIMA-LAD with dLAD ~70-80%;; SVG-OM2-OM3: 95% proximal, 50% mid, Diffuse ISR in BMS-OM2 & DES in Seq limb-OM3;; b) PCI:  prox SVG-OM-OM - Resolute DES 3.0 mm x 26 mm (3.3 mm); PTCA (Angiosculpt) to ISR in Seq limb - ~3.0 mm.; c)  Myoview 08/25/13 - small non-T-mural Inf scar, No Ischemia  . Stented coronary artery 2003; 01/2012; 05/2013   a) '03 BMS SVG-OM2-OM3 (initial limb into OM2);; b) 2013 - DES to Seq Lmg SVG-OM2-OM3 - Xience DES;; c) 05/2013 Prox SVG--OM2-OM3 Resolute DES 3.0 mm x 26 mm; Angiosculpt PTCA of ISR in Seq Limb (3.0 mm)    Past Surgical History:  Procedure Laterality Date  . CATARACT EXTRACTION W/ INTRAOCULAR LENS  IMPLANT, BILATERAL Bilateral   . CORONARY ANGIOPLASTY WITH STENT PLACEMENT  2003   BMS 3.0 mm x 8 mm stent to SVG to OM & cuttin balloon to diagonal; 100% occluded vein graft to PDA, patent LIMA; 80% stenosis in SVG-diagnal; vein graft stenosis in OM  . CORONARY ARTERY BYPASS GRAFT  1998   SVG-diagonal, DVG-OM, DVG-PDA, LIMA-LAD  . ESOPHAGOGASTRODUODENOSCOPY N/A 06/29/2013   Procedure: ESOPHAGOGASTRODUODENOSCOPY (EGD);  Surgeon: Beryle Beams, MD;  Location: [facility] ENDOSCOPY;  Service: Endoscopy;  Laterality: N/A;  . LEFT HEART CATHETERIZATION WITH CORONARY/GRAFT ANGIOGRAM N/A 01/13/2012   Procedure: LEFT HEART CATHETERIZATION WITH Beatrix Fetters;  Surgeon: Lorretta Harp, MD;  Location: [facility] CATH LAB;  Service: Cardiovascular: PCI & stenting of SVG-OM2 to OM3 segment with Xience Xpedition 2.38mmx15mm (3.60mm) DES  . LEFT HEART CATHETERIZATION WITH CORONARY/GRAFT ANGIOGRAM N/A 06/28/2013   Procedure: LEFT HEART CATHETERIZATION WITH Beatrix Fetters;  Surgeon: Troy Sine, MD;  Location: [facility] CATH LAB;  Service: Cardiovascular: Patent LIMA-LAD (progression of distal LAD ~80%, SVG-Diag, SVG-RCA 100%. PCI to SVG-CxOM2-OM3 99% prox lesion (Resolute DES 3.0 x 26 - 3.4 mm) & PTCA of  95% ISR in sequential limb (3.0 mm Angiosculpt)   . NM MYOVIEW LTD  07/2013   Test looked good!! Evidence of possible old MI / scar, but nothing to suggest ischemia. Not Gated b/c ectopy .  [name] TONSILLECTOMY      Current Meds  Medication Sig  . amLODipine-benazepril (LOTREL) 10-20 MG capsule Take 1 capsule by mouth daily.  [name] atenolol (TENORMIN) 50 MG tablet TAKE ONE TABLET BY MOUTH DAILY  . hydrochlorothiazide (MICROZIDE) 12.5 MG capsule Take 1 capsule (12.5 mg total) by mouth as needed.  . nitroGLYCERIN (NITROSTAT) 0.4 MG SL tablet Place 1 tablet (0.4  mg total) under the tongue every 5 (five) minutes as needed (up to 3 doses).  . pantoprazole (PROTONIX) 40 MG tablet Take 1 tablet (40 mg total) by mouth daily.  . pravastatin (PRAVACHOL) 40 MG tablet TAKE ONE TABLET BY MOUTH ONCE DAILY  . ticagrelor (BRILINTA) 90 MG TABS tablet Take 1 tablet (90 mg total) by mouth 2 (two) times daily.  . [DISCONTINUED] hydrochlorothiazide (MICROZIDE) 12.5 MG capsule TAKE ONE CAPSULE BY MOUTH ONCE DAILY  . [DISCONTINUED] isosorbide mononitrate (IMDUR) 30 MG 24 hr tablet TAKE ONE TABLET BY MOUTH ONCE DAILY    Allergies  Allergen Reactions  . Keflex [Cephalexin] Other (See Comments)    Unknown   . Lipitor [Atorvastatin] Other (See Comments)    Elevated blood sugar  . Sulfa Antibiotics Nausea Only  . Zocor [Simvastatin] Other (See Comments)    Unknown     Social History   Social History    . Marital status: Married    Spouse name: N/A  . Number of children: N/A  . Years of education: N/A   Social History Main Topics  . Smoking status: Former Smoker    Packs/day: 2.00    Years: 30.00    Types: Cigarettes    Quit date: 03/02/1990  . Smokeless tobacco: Never Used  . Alcohol use Yes     Comment: 06/28/2013 "aien't drank in years; used to drink a little beer"  . Drug use: No  . Sexual activity: No   Other Topics Concern  . None   Social History Narrative   Retired Married father of 2, grandfather tube.  Exercises routinely walking a roughly an hour a day.  Does not smoke does not drink.  He quit smoking many years ago.    family history includes Cancer in his mother; Heart attack in his father.  Wt Readings from Last 3 Encounters:  07/07/16 173 lb (78.5 kg)  01/10/16 177 lb 3.2 oz (80.4 kg)  06/18/15 178 lb 1.6 oz (80.8 kg)    PHYSICAL EXAM BP 110/60   Pulse (!) 59   Ht 5\' 11"  (1.803 m)   Wt 173 lb (78.5 kg)   BMI 24.13 kg/m  General appearance: alert, cooperative, appears Younger than stated age, no distress. Healthy-appearing. HEENT: [place]/AT, EOMI, MMM, anicteric sclera Neck: no adenopathy, soft bilateral carotid bruit (R>L); no JVD Lungs: clear to auscultation bilaterally, normal percussion bilaterally and non-labored Heart: regular rate and rhythm, S1 &S2 normal, no murmur, click, rub or gallop; nondisplaced PMI Abdomen: soft, non-tender; bowel sounds normal; no masses,  no organomegaly; no HJR Extremities: extremities normal, atraumatic, no cyanosis, and edema - trace Pulses: 2+ and symmetric;  Skin: mobility and turgor normal, no evidence of bleeding or bruising, no lesions noted, temperature normal and texture normal Neurologic: Mental status: Alert & oriented x 3, thought content appropriate; non-focal exam.  Pleasant mood & affect.    Adult ECG Report  Rate: 59 ;  Rhythm: sinus bradycardia and Left axis deviation (-55). IVCD in LBBB pattern. Cannot  exclude septal MI, age undetermined.  otherwise normal intervals, durations and voltage.  Narrative Interpretation: Stable EKG   Other studies Reviewed: Additional studies/ records that were reviewed today include:  Recent Labs:  Checked immediately following his visit Lab Results  Component Value Date   CHOL 127 07/07/2016   HDL 41 07/07/2016   LDLCALC 62 07/07/2016   TRIG 120 07/07/2016   CHOLHDL 3.1 07/07/2016   Lab Results  Component Value Date   CREATININE 1.06  07/07/2016   BUN 18 07/07/2016   NA 141 07/07/2016   K 4.7 07/07/2016   CL 104 07/07/2016   CO2 28 07/07/2016    ASSESSMENT / PLAN: Problem List Items Addressed This Visit    CAD, multiple vessel;  CABG in 1998; BMS- SVG-OM with cutting balloon to diag in 2003; DES to Seq SVG-OM2-3 (2013); DES - Prox SVG-OM2-3 & PTCA of ISR in Seq limb;; Residual ~80% in LAD post LIMA (Chronic)    Overall, he is stable and doing relatively well. He does have stable probably class I angina that is relatively well-controlled on current medications including atenolol, amlodipine and Imdur.  Plan: Increase to 60 mg      Relevant Medications   hydrochlorothiazide (MICROZIDE) 12.5 MG capsule   isosorbide mononitrate (IMDUR) 60 MG 24 hr tablet   Other Relevant Orders   EKG 12-Lead (Completed)   Coronary artery disease involving coronary bypass graft of native heart with angina pectoris ([place]) - Primary (Chronic)    History of CABG with several interventions to the SVG-OM. Having some increased anginal symptoms. This is still only with significant exertion.  Plan: Monitor for increased symptoms.  Increase Imdur to 60 mg daily.  Plan to check a Myoview at his next follow-up in 6 months -especially if symptoms are persistent.  For now we'll continue with atenolol and combination amlodipine-benazepril. May want to eventually convert from atenolol to carvedilol or metoprolol.  He is on Lantus alone without aspirin for his stents        Relevant Medications   hydrochlorothiazide (MICROZIDE) 12.5 MG capsule   isosorbide mononitrate (IMDUR) 60 MG 24 hr tablet   Other Relevant Orders   EKG 12-Lead (Completed)   Comprehensive metabolic panel (Completed)   Dyslipidemia, goal LDL below 70 (Chronic)    Lipids checked the morning of exam look great. He is only on moderate dose pravastatin. Can recheck next year at this time.      Relevant Medications   hydrochlorothiazide (MICROZIDE) 12.5 MG capsule   isosorbide mononitrate (IMDUR) 60 MG 24 hr tablet   Other Relevant Orders   Comprehensive metabolic panel (Completed)   Lipid panel (Completed)   Essential hypertension (Chronic)    Well-controlled blood pressure medications. Plan will be to convert HCTZ to simply when necessary basis for swelling as opposed to standing dose. This is other concern for history of orthostatic hypotension.      Relevant Medications   hydrochlorothiazide (MICROZIDE) 12.5 MG capsule   isosorbide mononitrate (IMDUR) 60 MG 24 hr tablet   History of: NSTEMI (non-ST elevated myocardial infarction) (Chronic)    He doesn't a fixed fusion defect in a Myoview consistent with old infarct. Thankfully no heart failure symptoms and he is a preserved EF.      Relevant Medications   hydrochlorothiazide (MICROZIDE) 12.5 MG capsule   isosorbide mononitrate (IMDUR) 60 MG 24 hr tablet   Other Relevant Orders   EKG 12-Lead (Completed)   Stable angina (HCC) (Chronic)    He hasn't mentioned noticing some chest tightness with heavy exertion in the last couple visits. This the first time he did. I'm increasing his Imdur, and told him that if the symptoms happen with less activity he needs to let us know. If they are persistent or slightly worse, we may need to schedule his upcoming Myoview soon rather than later. Currently planned to be ordered after his follow-up visit this fall.      Relevant Medications   hydrochlorothiazide (MICROZIDE) 12.5 MG  capsule    isosorbide mononitrate (IMDUR) 60 MG 24 hr tablet   Other Relevant Orders   EKG 12-Lead (Completed)      Current medicines are reviewed at length with the patient today. (+/- concerns) None The following changes have been made: See below  Patient Instructions  Medication changes  Increase isosorbide mononitrate to 60 mg  Once daily  - may use the HCTZ as needed for puffiness or swelling- daily  Labs - do not eat or drink the morning of the test cmp Lipids   Your physician wants you to follow-up in 6 months with DR Gabreil [place].You will receive a reminder letter in the mail two months in advance. If you don't receive a letter, please call our office to schedule the follow-up appointment.   If you need a refill on your cardiac medications before your next appointment, please call your pharmacy.     Studies Ordered:   Orders Placed This Encounter  Procedures  . Comprehensive metabolic panel  . Lipid panel  . EKG 12-Lead      Glenetta Hew, M.D., M.S. Interventional Cardiologist   Pager # [phone] Phone # [phone] [address] [place], [place] [address]

## 2016-07-07 NOTE — Patient Instructions (Addendum)
Medication changes  Increase isosorbide mononitrate to 60 mg  Once daily  - may use the HCTZ as needed for puffiness or swelling- daily  Labs - do not eat or drink the morning of the test cmp Lipids   Your physician wants you to follow-up in 6 months with DR HARDING.You will receive a reminder letter in the mail two months in advance. If you don't receive a letter, please call our office to schedule the follow-up appointment.   If you need a refill on your cardiac medications before your next appointment, please call your pharmacy.

## 2016-07-08 ENCOUNTER — Telehealth: Payer: Self-pay | Admitting: *Deleted

## 2016-07-08 NOTE — Telephone Encounter (Signed)
-----   Message from [name], MD sent at 07/07/2016 10:05 PM EDT ----- Cholesterol levels continue to look excellent. Chemistry panel also looks essentially normal with normal kidney function and liver function. The only abnormal finding is the elevated glucose level of 142.  Continue current meds for lipid control.  Glenetta Hew, MD

## 2016-07-08 NOTE — Telephone Encounter (Signed)
Spoke to patien'ts wife. Result given . Verbalized understanding  

## 2016-07-09 ENCOUNTER — Encounter: Payer: Self-pay | Admitting: Cardiology

## 2016-07-09 NOTE — Assessment & Plan Note (Addendum)
Well-controlled blood pressure medications. Plan will be to convert HCTZ to simply when necessary basis for swelling as opposed to standing dose. This is other concern for history of orthostatic hypotension.

## 2016-07-09 NOTE — Assessment & Plan Note (Signed)
He hasn't mentioned noticing some chest tightness with heavy exertion in the last couple visits. This the first time he did. I'm increasing his Imdur, and told him that if the symptoms happen with less activity he needs to let us know. If they are persistent or slightly worse, we may need to schedule his upcoming Myoview soon rather than later. Currently planned to be ordered after his follow-up visit this fall.

## 2016-07-09 NOTE — Assessment & Plan Note (Addendum)
Overall, he is stable and doing relatively well. He does have stable probably class I angina that is relatively well-controlled on current medications including atenolol, amlodipine and Imdur.  Plan: Increase to 60 mg

## 2016-07-09 NOTE — Assessment & Plan Note (Addendum)
Lipids checked the morning of exam look great. He is only on moderate dose pravastatin. Can recheck next year at this time.

## 2016-07-09 NOTE — Assessment & Plan Note (Signed)
He doesn't a fixed fusion defect in a Myoview consistent with old infarct. Thankfully no heart failure symptoms and he is a preserved EF.

## 2016-07-09 NOTE — Assessment & Plan Note (Addendum)
History of CABG with several interventions to the SVG-OM. Having some increased anginal symptoms. This is still only with significant exertion.  Plan: Monitor for increased symptoms.  Increase Imdur to 60 mg daily.  Plan to check a Myoview at his next follow-up in 6 months -especially if symptoms are persistent.  For now we'll continue with atenolol and combination amlodipine-benazepril. May want to eventually convert from atenolol to carvedilol or metoprolol.  He is on Lantus alone without aspirin for his stents

## 2016-07-16 ENCOUNTER — Other Ambulatory Visit: Payer: Self-pay | Admitting: Cardiology

## 2016-07-16 ENCOUNTER — Telehealth: Payer: Self-pay | Admitting: Cardiology

## 2016-07-16 NOTE — Telephone Encounter (Signed)
New message     How often should pt take the (ex 1 or 2 times a day) hydrochlorothiazide (MICROZIDE) 12.5 MG capsule Take 1 capsule (12.5 mg total) by mouth as needed.

## 2016-07-16 NOTE — Telephone Encounter (Signed)
Returned call to pharmacist at [facility].Advised HCTZ prescribed 12.5 mg daily if needed for swelling.

## 2016-08-14 ENCOUNTER — Other Ambulatory Visit: Payer: Self-pay | Admitting: Cardiology

## 2016-08-14 MED ORDER — ISOSORBIDE MONONITRATE ER 60 MG PO TB24: 60.0000 mg | ORAL_TABLET | Freq: Every day | ORAL | 0 refills | Status: DC

## 2016-08-19 ENCOUNTER — Telehealth: Payer: Self-pay | Admitting: Cardiology

## 2016-08-19 MED ORDER — TICAGRELOR 90 MG PO TABS: 90.0000 mg | ORAL_TABLET | Freq: Two times a day (BID) | ORAL | 3 refills | Status: DC

## 2016-08-19 NOTE — Telephone Encounter (Signed)
Spoke with pt wife, paperwork placed in the mail to patients confirmed home address.

## 2016-08-19 NOTE — Telephone Encounter (Signed)
New message    Pt wife is calling to find out if form was received from medications company. She said it's about pt getting medication for free. Please call

## 2016-08-19 NOTE — Telephone Encounter (Signed)
rx printed, copy of form left in Dr. Allison Quarry box. I've called wife to come pick up form so they can fill out their portion and return it for fax.

## 2016-08-19 NOTE — Telephone Encounter (Signed)
Follow up     Pt wife wants to know if the form can be mailed back to her.

## 2016-08-19 NOTE — Telephone Encounter (Signed)
Spoke w wife of patient.  Mr. Crable gets Brilinta for free through [employer] via eligibility based on income. They fill out paperwork every year.  She asked me if something had come in off of fax machine, states [place] was sending blank form for fill out. Voiced I was not able to locate this but would be happy to print off the form from website and start filling this out.  Informed her we will let her know when she can come by and pick this up to fill out patient information - she knows the completed form will need to be faxed from our office.  Pt has 2 weeks of Brilinta left - advised to let us know if he needs samples.   Caller voiced understanding and thanks.

## 2016-08-26 ENCOUNTER — Telehealth: Payer: Self-pay | Admitting: Cardiology

## 2016-08-26 NOTE — Telephone Encounter (Signed)
Wife notified she states that she is coming in to bring in form, she states that she has a question

## 2016-08-26 NOTE — Telephone Encounter (Signed)
Samples at the front desk 

## 2016-08-26 NOTE — Telephone Encounter (Signed)
Patient calling the office for samples of medication:   1.  What medication and dosage are you requesting samples for? Brilinta 90 mg   2.  Are you currently out of this medication? Patient only has a few pills   Patient wife calling, states that she will come to our office to drop off a form and wanted to know if she could pick up the Brilinta samples when she comes today.

## 2016-10-05 ENCOUNTER — Other Ambulatory Visit: Payer: Self-pay | Admitting: Cardiology

## 2017-01-09 ENCOUNTER — Other Ambulatory Visit: Payer: Self-pay | Admitting: Cardiology

## 2017-01-11 NOTE — Telephone Encounter (Signed)
Rx has been sent to the pharmacy electronically. ° °

## 2017-01-14 ENCOUNTER — Ambulatory Visit (INDEPENDENT_AMBULATORY_CARE_PROVIDER_SITE_OTHER): Payer: Medicare Other | Admitting: Cardiology

## 2017-01-14 ENCOUNTER — Encounter: Payer: Self-pay | Admitting: Cardiology

## 2017-01-14 VITALS — BP 148/68 | HR 76 | Ht 71.0 in | Wt 176.0 lb

## 2017-01-14 DIAGNOSIS — E785 Hyperlipidemia, unspecified: Secondary | ICD-10-CM | POA: Diagnosis not present

## 2017-01-14 DIAGNOSIS — I10 Essential (primary) hypertension: Secondary | ICD-10-CM | POA: Diagnosis not present

## 2017-01-14 DIAGNOSIS — I951 Orthostatic hypotension: Secondary | ICD-10-CM

## 2017-01-14 DIAGNOSIS — I25709 Atherosclerosis of coronary artery bypass graft(s), unspecified, with unspecified angina pectoris: Secondary | ICD-10-CM | POA: Diagnosis not present

## 2017-01-14 DIAGNOSIS — I209 Angina pectoris, unspecified: Secondary | ICD-10-CM

## 2017-01-14 DIAGNOSIS — I2510 Atherosclerotic heart disease of native coronary artery without angina pectoris: Secondary | ICD-10-CM | POA: Diagnosis not present

## 2017-01-14 DIAGNOSIS — I208 Other forms of angina pectoris: Secondary | ICD-10-CM

## 2017-01-14 NOTE — Patient Instructions (Signed)
NO MEDICATION CHANGES    LABS PLEASE HAVE LABS ---CMP ,LIPIDS---DONE MAY 2019 WILL MAIL LABSLIP AT THAT TIME  DO NOT EAT OR DRINK THE MORNING OF THE TEST    SCHEDULE AT [address] Your physician has requested that you have a lexiscan myoview. For further information please visit [email or web address]. Please follow instruction sheet, as given.    Your physician wants you to follow-up in June 2019 [place]. You will receive a reminder letter in the mail two months in advance. If you don't receive a letter, please call our office to schedule the follow-up appointment.    If you need a refill on your cardiac medications before your next appointment, please call your pharmacy.

## 2017-01-14 NOTE — Progress Notes (Signed)
PCP: Denita Lung, MD  Clinic Note: Chief Complaint  Patient presents with  . Follow-up    No major complaint  . Coronary Artery Disease    HPI: Xavier Giles is a 76 y.o. male with a PMH below who presents today for six-month follow-up for CAD-CABG-PCI with stable angina.Xavier Giles April 2015: Unstable angina: Cardiac catheterization showing patent LIMA-LAD but progression of disease in the distal LAD to ~80%. Ostial occlusion of SVG-Diag & SVG-RCA. PCI to SVG-CxOM2-OM3 99% prox lesion & PTCA of 95% ISR in sequential limb..  08/24/13: Myoview Stress Test looked good!! Evidence of possible old MI / scar, but nothing to suggest ischemia. Not Gated b/c ectopy .  Xavier Giles was last seen May 2018.  He was stable.no Notable for exertional dyspnea walking uphill. Walking on flat ground or with minimal hills, he is able to walk about a mile without problems. He walked for 4 hours this morning. He says he works in the garden for about 10 minutes and then would stop and rest  Recent Hospitalizations: none  Studies Personally Reviewed - (if available, images/films reviewed: From Epic Chart or Care Everywhere)  none  Interval History: Xavier Giles returns today overall doing relatively well.  He has no major complaints to speak of.  He still notes that walking on flat ground is usually no problem for him, but he gets short of breath if he goes uphill or upstairs.  Routine activity is relatively no problem for him.  He denies any chest tightness or pressure with rest or exertion.  No resting symptoms of PND, orthopnea or edema.  Occasional positional dizziness with bending over, but no syncope/near syncope.  No TIA or amaurosis fugax symptoms. He seems to be kind of evasive today not really answering questions with much conviction.  Almost a bit hesitant  No claudication.  ROS: A comprehensive was performed. Review of Systems  Constitutional: Negative for malaise/fatigue.  HENT: Negative for  congestion and nosebleeds.   Respiratory: Positive for cough (Occasional cough).   Gastrointestinal: Positive for heartburn. Negative for blood in stool, melena, nausea and vomiting.  Genitourinary: Negative for hematuria.  Musculoskeletal: Positive for joint pain (Hips and knees bother him.  This is partly what makes it hard for him to walk.). Negative for falls.       Still notes that his hands cramp up  Neurological: Positive for dizziness (Per HPI). Negative for weakness.  Endo/Heme/Allergies: Negative for environmental allergies. Does not bruise/bleed easily.  Psychiatric/Behavioral: Negative for memory loss. The patient is not nervous/anxious and does not have insomnia.   All other systems reviewed and are negative.  I have reviewed and (if needed) personally updated the patient's problem list, medications, allergies, past medical and surgical history, social and family history.   Past Medical History:  Diagnosis Date  . Asthma   . 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 CAD, multiple vessel 1998   Referred for CABG  . Carotid bruit present 01/2012   a. asymptomatic; carotid doppler 01/2012 - R bulb/prox ICA mild-mod amt of fibrous plaque, L subclavian 70-99% diameter reduction, L bulb/ICA mild amt of fibrous plaque  . Chronic low back pain   . Dyslipidemia, goal LDL below 70   . Hypertension, essential   . Non-ST elevated  myocardial infarction (non-STEMI) ([place]) 01/2012   SVG-diagonal occluded, 50% distal left main, 70-80% proximal LAD and 90% mid. Patent LIMA with 70% lesion post anastomosis. SVG to RCA occluded, SVG-OM2-3 90% sequential limb stenosis -- PCI SVG-OM 2 -OM 3: Xience Xpedition DES 2.75 MM x15 MM (3 MM)   . S/P CABG x 5 1998   LIMA-LAD, SVG-OM2-OM3; SVG-rPDA, SVG-D1  . S/P cardiac  catheterization 06/28/2013   Cres Angina: a) 60% dLM; 70% Ostial LAD; 90% Cx - no OMs seen; 100% RCA;; SVG-D1 & SVG--dRCA occluded; Patent LIMA-LAD with dLAD ~70-80%;; SVG-OM2-OM3: 95% proximal, 50% mid, Diffuse ISR in BMS-OM2 & DES in Seq limb-OM3;; b) PCI:  prox SVG-OM-OM - Resolute DES 3.0 mm x 26 mm (3.3 mm); PTCA (Angiosculpt) to ISR in Seq limb - ~3.0 mm.; c)  Myoview 08/25/13 - small non-T-mural Inf scar, No Ischemia  . Stented coronary artery 2003; 01/2012; 05/2013   a) '03 BMS SVG-OM2-OM3 (initial limb into OM2);; b) 2013 - DES to Seq Lmg SVG-OM2-OM3 - Xience DES;; c) 05/2013 Prox SVG--OM2-OM3 Resolute DES 3.0 mm x 26 mm; Angiosculpt PTCA of ISR in Seq Limb (3.0 mm)    Past Surgical History:  Procedure Laterality Date  . CATARACT EXTRACTION W/ INTRAOCULAR LENS  IMPLANT, BILATERAL Bilateral   . CORONARY ANGIOPLASTY WITH STENT PLACEMENT  2003   BMS 3.0 mm x 8 mm stent to SVG to OM & cuttin balloon to diagonal; 100% occluded vein graft to PDA, patent LIMA; 80% stenosis in SVG-diagnal; vein graft stenosis in OM  . CORONARY ARTERY BYPASS GRAFT  1998   SVG-diagonal, DVG-OM, DVG-PDA, LIMA-LAD  . ESOPHAGOGASTRODUODENOSCOPY (EGD) N/A 06/29/2013   Performed by Beryle Beams, MD at [facility]  . LEFT HEART CATHETERIZATION WITH CORONARY/GRAFT ANGIOGRAM N/A 06/28/2013   Performed by Troy Sine, MD at [facility] CATH LAB  . LEFT HEART CATHETERIZATION WITH CORONARY/GRAFT ANGIOGRAM N/A 01/13/2012   Performed by Lorretta Harp, MD at [facility] CATH LAB  . NM MYOVIEW LTD  07/2013   Test looked good!! Evidence of possible old MI / scar, but nothing to suggest ischemia. Not Gated b/c ectopy .  Xavier Giles TONSILLECTOMY      Current Meds  Medication Sig  . amLODipine-benazepril (LOTREL) 10-20 MG capsule TAKE 1 CAPSULE BY MOUTH ONCE DAILY  . atenolol (TENORMIN) 50 MG tablet TAKE ONE TABLET BY MOUTH ONCE DAILY  . hydrochlorothiazide (MICROZIDE) 12.5 MG capsule Take 1 capsule (12.5 mg total) by mouth as needed.  .  nitroGLYCERIN (NITROSTAT) 0.4 MG SL tablet Place 1 tablet (0.4 mg total) under the tongue every 5 (five) minutes as needed (up to 3 doses).  . pantoprazole (PROTONIX) 40 MG tablet Take 1 tablet (40 mg total) by mouth daily.  . pravastatin (PRAVACHOL) 40 MG tablet TAKE ONE TABLET BY MOUTH ONCE DAILY  . ticagrelor (BRILINTA) 90 MG TABS tablet Take 1 tablet (90 mg total) by mouth 2 (two) times daily.    Allergies  Allergen Reactions  . Keflex [Cephalexin] Other (See Comments)    Unknown   . Zocor [Simvastatin] Other (See Comments)    Unknown   . Lipitor [Atorvastatin] Other (See Comments)    Elevated blood sugar  . Sulfa Antibiotics Nausea Only    Social History   Socioeconomic History  . Marital status: Married    Spouse name: None  . Number of children: None  . Years of education: None  . Highest education level: None  Social Needs  . Financial resource strain: None  .  Food insecurity - worry: None  . Food insecurity - inability: None  . Transportation needs - medical: None  . Transportation needs - non-medical: None  Occupational History  . None  Tobacco Use  . Smoking status: Former Smoker    Packs/day: 2.00    Years: 30.00    Pack years: 60.00    Types: Cigarettes    Last attempt to quit: 03/02/1990    Years since quitting: 26.8  . Smokeless tobacco: Never Used  Substance and Sexual Activity  . Alcohol use: Yes    Comment: 06/28/2013 "aien't drank in years; used to drink a little beer"  . Drug use: No  . Sexual activity: No  Other Topics Concern  . None  Social History Narrative   Retired Married father of 2, grandfather tube.  Exercises routinely walking a roughly an hour a day.  Does not smoke does not drink.  He quit smoking many years ago.    family history includes Cancer in his mother; Heart attack in his father.  Wt Readings from Last 3 Encounters:  01/14/17 176 lb (79.8 kg)  07/07/16 173 lb (78.5 kg)  01/10/16 177 lb 3.2 oz (80.4 kg)    PHYSICAL  EXAM BP (!) 148/68   Pulse 76   Ht 5\' 11"  (1.803 m)   Wt 176 lb (79.8 kg)   BMI 24.55 kg/m  Physical Exam  Constitutional: He appears well-developed and well-nourished. No distress.  Healthy-appearing.  He actually seems younger than his stated age.  Well-groomed  HENT:  Head: Normocephalic and atraumatic.  Hard of hearing  Neck: No hepatojugular reflux and no JVD present. Carotid bruit is not present.  Cardiovascular: Normal rate, normal heart sounds and normal pulses. An irregular rhythm present.  Occasional extrasystoles are present. PMI is not displaced. Exam reveals no gallop.  No murmur heard. Nursing note and vitals reviewed.    Adult ECG Report  Rate: 62 ;  Rhythm: normal sinus rhythm and - new RBBB with repol changes.  LAD (-84 ~ LAFB).  ;   Narrative Interpretation: RBBB is new. Ectopy heard on exam, but no ectopy on EKG.   Other studies Reviewed: Additional studies/ records that were reviewed today include:  Recent Labs:   Lab Results  Component Value Date   CREATININE 1.06 07/07/2016   BUN 18 07/07/2016   NA 141 07/07/2016   K 4.7 07/07/2016   CL 104 07/07/2016   CO2 28 07/07/2016   Lab Results  Component Value Date   CHOL 127 07/07/2016   HDL 41 07/07/2016   LDLCALC 62 07/07/2016   TRIG 120 07/07/2016   CHOLHDL 3.1 07/07/2016   Lab Results  Component Value Date   HGBA1C 6.4 (H) 06/28/2013     ASSESSMENT / PLAN: Problem List Items Addressed This Visit    CAD, multiple vessel;  CABG in 1998; BMS- SVG-OM with cutting balloon to diag in 2003; DES to Seq SVG-OM2-3 (2013); DES - Prox SVG-OM2-3 & PTCA of ISR in Seq limb;; Residual ~80% in LAD post LIMA - Primary (Chronic)    He seems to be doing relatively well.  Probably still has some mild stable class I angina, but since we started Imdur seems to be well controlled.  He really does not notice much change in having increased his dose. Continue beta-blocker, amlodipine and Imdur at current dose.  On  statin.  Not on aspirin, but is still on Brilinta.  Would be okay to stop Brilinta for procedures (5 -7  days preop)      Relevant Orders   EKG 12-Lead (Completed)   MYOCARDIAL PERFUSION IMAGING   Comprehensive metabolic panel   Coronary artery disease involving coronary bypass graft of native heart with angina pectoris (HCC) (Chronic)    Several interventions to the SVG-OM.  Monitoring for now on medical management. Would be due for follow-up Myoview prior to his next visit in June 2019. Continue current medications for now, but may consider converting atenolol to carvedilol if blood pressure continues to be an issue.      Relevant Orders   EKG 12-Lead (Completed)   MYOCARDIAL PERFUSION IMAGING   Comprehensive metabolic panel   Dyslipidemia, goal LDL below 70 (Chronic)    Lipids are well controlled on current dose of Pravachol.  No change.  Due for follow-up labs before next visit      Relevant Orders   Lipid panel   Comprehensive metabolic panel   Essential hypertension (Chronic)    Relatively well-controlled blood pressure today. He is taking his HCTZ now on a as needed basis. No significant orthostatic hypotension.      Orthostatic hypotension    Avoid dehydration.  During last visit we rearranged his dosage interval for his different medications taking atenolol in the evening and Lotrel plus HCTZ in the morning.      Stable angina (HCC) (Chronic)    Relatively stable now on Imdur, amlodipine and beta-blocker.      Relevant Orders   EKG 12-Lead (Completed)   MYOCARDIAL PERFUSION IMAGING   Comprehensive metabolic panel      Current medicines are reviewed at length with the patient today. (+/- concerns) n/a The following changes have been made: n/a  Patient Instructions  NO MEDICATION CHANGES    LABS PLEASE HAVE LABS ---CMP ,LIPIDS---DONE MAY 2019 WILL MAIL LABSLIP AT THAT TIME  DO NOT EAT OR DRINK THE MORNING OF THE TEST    SCHEDULE AT [place] [address] Your physician has requested that you have a lexiscan myoview. For further information please visit [email or web address]. Please follow instruction sheet, as given.    Your physician wants you to follow-up in June 2019 [place]. You will receive a reminder letter in the mail two months in advance. If you don't receive a letter, please call our office to schedule the follow-up appointment.    If you need a refill on your cardiac medications before your next appointment, please call your pharmacy.      Studies Ordered:   Orders Placed This Encounter  Procedures  . Lipid panel  . Comprehensive metabolic panel  . MYOCARDIAL PERFUSION IMAGING  . EKG 12-Lead      Glenetta Hew, M.D., M.S. Interventional Cardiologist   Pager # [phone] Phone # [phone] [address] [place], [place] [address]

## 2017-01-16 ENCOUNTER — Encounter: Payer: Self-pay | Admitting: Cardiology

## 2017-01-16 NOTE — Assessment & Plan Note (Signed)
Avoid dehydration.  During last visit we rearranged his dosage interval for his different medications taking atenolol in the evening and Lotrel plus HCTZ in the morning.

## 2017-01-16 NOTE — Assessment & Plan Note (Signed)
Relatively well-controlled blood pressure today. He is taking his HCTZ now on a as needed basis. No significant orthostatic hypotension.

## 2017-01-16 NOTE — Assessment & Plan Note (Addendum)
Lipids are well controlled on current dose of Pravachol.  No change.  Due for follow-up labs before next visit

## 2017-01-16 NOTE — Assessment & Plan Note (Signed)
Several interventions to the SVG-OM.  Monitoring for now on medical management. Would be due for follow-up Myoview prior to his next visit in June 2019. Continue current medications for now, but may consider converting atenolol to carvedilol if blood pressure continues to be an issue.

## 2017-01-16 NOTE — Assessment & Plan Note (Signed)
Relatively stable now on Imdur, amlodipine and beta-blocker.

## 2017-01-16 NOTE — Assessment & Plan Note (Signed)
He seems to be doing relatively well.  Probably still has some mild stable class I angina, but since we started Imdur seems to be well controlled.  He really does not notice much change in having increased his dose. Continue beta-blocker, amlodipine and Imdur at current dose.  On statin.  Not on aspirin, but is still on Brilinta.  Would be okay to stop Brilinta for procedures (5 -7 days preop)

## 2017-03-10 ENCOUNTER — Telehealth: Payer: Self-pay | Admitting: Cardiology

## 2017-03-10 MED ORDER — TICAGRELOR 90 MG PO TABS: 90.0000 mg | ORAL_TABLET | Freq: Two times a day (BID) | ORAL | 3 refills | Status: DC

## 2017-03-10 NOTE — Telephone Encounter (Signed)
°*  STAT* If patient is at the pharmacy, call can be transferred to refill team.   1. Which medications need to be refilled? (please list name of each medication and dose if known) need a new prescription for Brlinta 2. Which pharmacy/location (including street and city if local pharmacy) is medication to be sent to?[place] for free medicine  3. Do they need a 30 day or 90 day supply?

## 2017-03-10 NOTE — Telephone Encounter (Signed)
Spoke with wife and she called to reorder patients Brilinta and was told needed new Rx, still approved through June. Per wife they were supposed to fax form, did not see form. [place] [phone] and with them. Ok to just fax to [phone] ID # [id number] with handwritten signature and DEA or license number on RX. Rx printed and will give to Ivin Booty to have signed and faxed.

## 2017-03-12 NOTE — Telephone Encounter (Signed)
LEFT MESSAGE ON VOICE MAIL - THE INFORMATION FOR PATIENT ASSISTANCE ( BRILINTA) WAS FAXED TODAY

## 2017-05-21 ENCOUNTER — Telehealth: Payer: Self-pay | Admitting: *Deleted

## 2017-05-21 DIAGNOSIS — I208 Other forms of angina pectoris: Secondary | ICD-10-CM

## 2017-05-21 DIAGNOSIS — I25709 Atherosclerosis of coronary artery bypass graft(s), unspecified, with unspecified angina pectoris: Secondary | ICD-10-CM

## 2017-05-21 DIAGNOSIS — I2510 Atherosclerotic heart disease of native coronary artery without angina pectoris: Secondary | ICD-10-CM

## 2017-05-21 DIAGNOSIS — E785 Hyperlipidemia, unspecified: Secondary | ICD-10-CM

## 2017-05-21 NOTE — Telephone Encounter (Signed)
-----   Message from Raiford Simmonds, RN sent at 01/14/2017  9:40 AM EST ----- MAIL LAB AND LETTER LIPID ,CMP MARCH 2019  DUE BY 07/14/17

## 2017-05-21 NOTE — Telephone Encounter (Signed)
letter was mailed and labslip  cmp and lipid

## 2017-06-24 DIAGNOSIS — I2510 Atherosclerotic heart disease of native coronary artery without angina pectoris: Secondary | ICD-10-CM | POA: Diagnosis not present

## 2017-06-24 DIAGNOSIS — I208 Other forms of angina pectoris: Secondary | ICD-10-CM | POA: Diagnosis not present

## 2017-06-24 DIAGNOSIS — E785 Hyperlipidemia, unspecified: Secondary | ICD-10-CM | POA: Diagnosis not present

## 2017-06-24 DIAGNOSIS — I25709 Atherosclerosis of coronary artery bypass graft(s), unspecified, with unspecified angina pectoris: Secondary | ICD-10-CM | POA: Diagnosis not present

## 2017-06-24 LAB — COMPREHENSIVE METABOLIC PANEL
ALK PHOS: 78 IU/L (ref 39–117)
ALT: 19 IU/L (ref 0–44)
AST: 19 IU/L (ref 0–40)
Albumin/Globulin Ratio: 2.4 — ABNORMAL HIGH (ref 1.2–2.2)
Albumin: 5 g/dL — ABNORMAL HIGH (ref 3.5–4.8)
BUN/Creatinine Ratio: 9 — ABNORMAL LOW (ref 10–24)
BUN: 10 mg/dL (ref 8–27)
Bilirubin Total: 0.7 mg/dL (ref 0.0–1.2)
CALCIUM: 9.7 mg/dL (ref 8.6–10.2)
CO2: 22 mmol/L (ref 20–29)
Chloride: 105 mmol/L (ref 96–106)
Creatinine, Ser: 1.07 mg/dL (ref 0.76–1.27)
GFR calc Af Amer: 76 mL/min/{1.73_m2} (ref >59)
GFR calc non Af Amer: 66 mL/min/{1.73_m2} (ref >59)
GLUCOSE: 155 mg/dL — AB (ref 65–99)
Globulin, Total: 2.1 g/dL (ref 1.5–4.5)
Potassium: 4.2 mmol/L (ref 3.5–5.2)
SODIUM: 143 mmol/L (ref 134–144)
Total Protein: 7.1 g/dL (ref 6.0–8.5)

## 2017-06-24 LAB — LIPID PANEL
CHOL/HDL RATIO: 2.8 ratio (ref 0.0–5.0)
CHOLESTEROL TOTAL: 122 mg/dL (ref 100–199)
HDL: 43 mg/dL (ref >39)
LDL CALC: 62 mg/dL (ref 0–99)
TRIGLYCERIDES: 83 mg/dL (ref 0–149)
VLDL Cholesterol Cal: 17 mg/dL (ref 5–40)

## 2017-06-30 HISTORY — PX: NM MYOVIEW LTD: HXRAD82

## 2017-07-09 ENCOUNTER — Telehealth (HOSPITAL_COMMUNITY): Payer: Self-pay

## 2017-07-09 NOTE — Telephone Encounter (Signed)
Encounter complete. 

## 2017-07-14 ENCOUNTER — Encounter (HOSPITAL_COMMUNITY): Payer: Medicare Other

## 2017-07-14 ENCOUNTER — Ambulatory Visit (HOSPITAL_COMMUNITY)
Admission: RE | Admit: 2017-07-14 | Discharge: 2017-07-14 | Disposition: A | Discharge status: Home / Self Care | Payer: Medicare Other | Source: Ambulatory Visit | Attending: Internal Medicine | Admitting: Internal Medicine

## 2017-07-14 DIAGNOSIS — I208 Other forms of angina pectoris: Secondary | ICD-10-CM

## 2017-07-14 DIAGNOSIS — I2510 Atherosclerotic heart disease of native coronary artery without angina pectoris: Secondary | ICD-10-CM | POA: Diagnosis not present

## 2017-07-14 DIAGNOSIS — I25709 Atherosclerosis of coronary artery bypass graft(s), unspecified, with unspecified angina pectoris: Secondary | ICD-10-CM | POA: Insufficient documentation

## 2017-07-14 LAB — MYOCARDIAL PERFUSION IMAGING
CHL CUP NUCLEAR SDS: 4
CHL CUP NUCLEAR SRS: 5
CHL CUP NUCLEAR SSS: 9
LV dias vol: 166 mL (ref 62–150)
LVSYSVOL: 91 mL
Peak HR: 69 {beats}/min
Rest HR: 52 {beats}/min
TID: 1.19

## 2017-07-14 IMAGING — NM NM MISC PROCEDURE
6 series · 36 of 36 positions shown · non-contrast
Comparison: none

[Series 1: wbr_r-proj_st wbr rest · 6.40mm/px · 6 of 64 frames shown]
[frame 6/64]
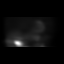
[frame 16/64]
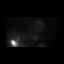
[frame 27/64]
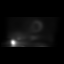
[frame 38/64]
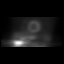
[frame 48/64]
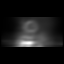
[frame 59/64]
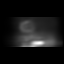

[Series 1: wbr rest · 6.40mm/px · 6 of 64 frames shown]
[frame 6/64]
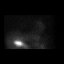
[frame 16/64]
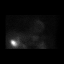
[frame 27/64]
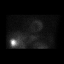
[frame 38/64]
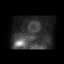
[frame 48/64]
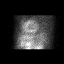
[frame 59/64]
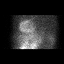

[Series 2: wbr stress-gsp · 6.40mm/px · 6 of 512 frames shown]
[frame 43/512]
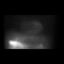
[frame 128/512]
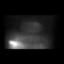
[frame 214/512]
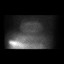
[frame 299/512]
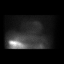
[frame 384/512]
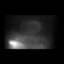
[frame 470/512]
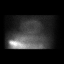

[Series 2: wbr_s-proj_st wbr stress-gsp · 6.40mm/px · 6 of 512 frames shown]
[frame 43/512]
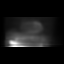
[frame 128/512]
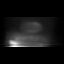
[frame 214/512]
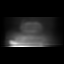
[frame 299/512]
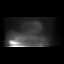
[frame 384/512]
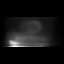
[frame 470/512]
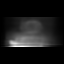

[Series 3: wbr stress-sum-em · 6.40mm/px · 6 of 64 frames shown]
[frame 6/64]
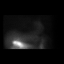
[frame 16/64]
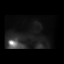
[frame 27/64]
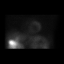
[frame 38/64]
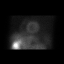
[frame 48/64]
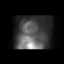
[frame 59/64]
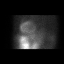

[Series 3: wbr_s-proj_st wbr stress-sum-em · 6.40mm/px · 6 of 64 frames shown]
[frame 6/64]
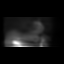
[frame 16/64]
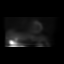
[frame 27/64]
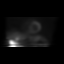
[frame 38/64]
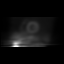
[frame 48/64]
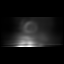
[frame 59/64]
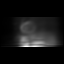

[36 of 36 positions shown; findings below may reference images not displayed]

Canned report from images found in remote index.

Refer to host system for actual result text.

## 2017-07-14 MED ORDER — TECHNETIUM TC 99M TETROFOSMIN IV KIT
8.0000 | PACK | Freq: Once | INTRAVENOUS | Status: AC | PRN
  Administered 2017-07-14: 8 via INTRAVENOUS
  Filled 2017-07-14: qty 8

## 2017-07-14 MED ORDER — TECHNETIUM TC 99M TETROFOSMIN IV KIT
25.8000 | PACK | Freq: Once | INTRAVENOUS | Status: AC | PRN
  Administered 2017-07-14: 25.8 via INTRAVENOUS
  Filled 2017-07-14: qty 26

## 2017-07-14 MED ORDER — REGADENOSON 0.4 MG/5ML IV SOLN
0.4000 mg | Freq: Once | INTRAVENOUS | Status: AC
  Administered 2017-07-14: 0.4 mg via INTRAVENOUS

## 2017-07-15 ENCOUNTER — Other Ambulatory Visit: Payer: Self-pay | Admitting: Cardiology

## 2017-07-15 ENCOUNTER — Telehealth: Payer: Self-pay | Admitting: *Deleted

## 2017-07-15 NOTE — Telephone Encounter (Signed)
-----   Message from [name], MD sent at 07/14/2017 11:31 PM EDT ----- Myoview result: Persistent evidence of old heart attack with mild to moderately reduced ejection fraction.  This is read as intermediate risk because of the reduced function and prior heart attack, but this is not a change from prior study. In the absence of angina or heart failure symptoms, I would continue treating medically and not pursue further evaluation.  Glenetta Hew, MD

## 2017-07-15 NOTE — Telephone Encounter (Signed)
Left detail message result of myoview on voicemail per dpr , any question may call back

## 2017-07-15 NOTE — Telephone Encounter (Signed)
Follow up     Spouse returning call to nurse.

## 2017-07-15 NOTE — Telephone Encounter (Signed)
Spoke to patien'ts wife. Result given . Verbalized understanding  

## 2017-07-15 NOTE — Telephone Encounter (Signed)
Pt's wife has question concerning pt's test.

## 2017-07-15 NOTE — Telephone Encounter (Signed)
LEFT MESSAGE TO CALL BACK IN REGARDS TO MYOVIEW RESULTS 

## 2017-09-17 ENCOUNTER — Ambulatory Visit (INDEPENDENT_AMBULATORY_CARE_PROVIDER_SITE_OTHER): Payer: Medicare Other | Admitting: Cardiology

## 2017-09-17 ENCOUNTER — Encounter: Payer: Self-pay | Admitting: Cardiology

## 2017-09-17 VITALS — BP 124/66 | HR 67 | Ht 71.0 in | Wt 171.2 lb

## 2017-09-17 DIAGNOSIS — E785 Hyperlipidemia, unspecified: Secondary | ICD-10-CM

## 2017-09-17 DIAGNOSIS — I208 Other forms of angina pectoris: Secondary | ICD-10-CM | POA: Diagnosis not present

## 2017-09-17 DIAGNOSIS — I2510 Atherosclerotic heart disease of native coronary artery without angina pectoris: Secondary | ICD-10-CM | POA: Diagnosis not present

## 2017-09-17 DIAGNOSIS — I214 Non-ST elevation (NSTEMI) myocardial infarction: Secondary | ICD-10-CM

## 2017-09-17 DIAGNOSIS — I25709 Atherosclerosis of coronary artery bypass graft(s), unspecified, with unspecified angina pectoris: Secondary | ICD-10-CM

## 2017-09-17 DIAGNOSIS — I10 Essential (primary) hypertension: Secondary | ICD-10-CM | POA: Diagnosis not present

## 2017-09-17 DIAGNOSIS — I951 Orthostatic hypotension: Secondary | ICD-10-CM | POA: Diagnosis not present

## 2017-09-17 DIAGNOSIS — Z955 Presence of coronary angioplasty implant and graft: Secondary | ICD-10-CM

## 2017-09-17 NOTE — Progress Notes (Signed)
PCP: Denita Lung, MD  Clinic Note: Chief Complaint  Patient presents with  . Follow-up    No real cardiac complaints.  . Coronary Artery Disease    see below. 4 yr ST reviewed    HPI: Xavier Giles is a 77 y.o. male with a PMH below who presents today for six-month follow-up for CAD-CABG (1998)-PCI (native & grafts) with stable angina. 2003: Abn ST -> PCI-SVG-OM (prox limb): 3.0 x 8 Zeta BMS- going into native OM2);; PTCA only native D1 through SVG; CTO SVG-RCA; patent LIMA Nov 2013 - NSTEMI: SVG-RCA & SVG-Diag CTO.  Severe SVG => DES PCI (Xience) April 2015: Unstable angina: Cardiac CATH: patent LIMA-LAD w/ progression of dLAD to ~80%.  PCI to SVG-CxOM2-OM3 99% prox lesion & PTCA of 95% ISR in sequential limb..  08/24/13: Myoview Stress Test post PCI looked good!! Evidence of possible old MI / scar, but nothing to suggest ischemia. Not Gated b/c ectopy .  -- No real change in f/u Myoview 06/2017 Walking on flat ground or with minimal hills, he is able to walk about a mile without problems. He walked for 4 hours this morning. He says he works in the garden for about 10 minutes and then would stop and rest  Xavier Giles was last seen in November 2018.  He was stable.no major complaints.  Does get short of breath walking up stairs but not on flat ground.  Recent Hospitalizations: none  Studies Personally Reviewed - (if available, images/films reviewed: From Epic Chart or Care Everywhere)  Myoview stress test May 2019: Intermediate risk because of reduced EF but stable.  Stable fixed inferior defect consistent with either diaphragmatic attenuation or inferior infarct.  Septal hypokinesis/dyskinesis due to LBBB and prior CABG.  EF is 46%.  Stable compared to 2015 following PCI  Interval History: Xavier Giles returns today overall doing relatively well.  As usual, Xavier Giles is very antsy, he stands up and moves around during entire interview.  He seems to be in in a hurry to go from  the  moment he arrives. He continues to be relatively active doing his routine level of exercise.  Again as Such as he does not try to walk upstairs or doing anything really "strenuous ", he denies any real shortness of breath or chest tightness.  In fact he really has not had any chest tightness except for a couple times when he really "overdid it".    He says that usually is able to walk at least a mile or so every morning without having an issue.  If he tries to go fast or house, pills, he may get short of breath.  If he goes up several flights of steps he will get short of breath. No claudication  He denies any PND, orthopnea or edema.  He does have occasional nighttime cramping issues, but no claudication symptoms. He still gets occasional .Positional dizziness bending over and standing back up again, but really otherwise denies any lightheadedness, dizziness or syncope/near syncope.  No TIA or amaurosis fugax Has occasional rare flip-flopping/palpitation symptoms.   ROS: A comprehensive was performed. Review of Systems  Constitutional: Negative for malaise/fatigue.  HENT: Negative for congestion and nosebleeds.   Respiratory: Positive for cough (Occasional cough) and shortness of breath (Only exertional).   Cardiovascular: Negative for claudication.  Gastrointestinal: Positive for heartburn. Negative for blood in stool, melena, nausea and vomiting.  Genitourinary: Negative for hematuria.  Musculoskeletal: Positive for joint pain (Hips and knees bother him.  This is partly what makes it hard for him to walk.). Negative for falls.       He occasionally notes hand cramping, and had some nighttime leg cramps.  Neurological: Positive for dizziness (Per HPI). Negative for weakness.  Endo/Heme/Allergies: Negative for environmental allergies. Does not bruise/bleed easily.  Psychiatric/Behavioral: Negative for memory loss. The patient is not nervous/anxious and does not have insomnia.   All other  systems reviewed and are negative.  I have reviewed and (if needed) personally updated the patient's problem list, medications, allergies, past medical and surgical history, social and family history.   Past Medical History:  Diagnosis Date  . Asthma   . 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 CAD, multiple vessel 1998   Referred for CABG  . Carotid bruit present 01/2012   a. asymptomatic; carotid doppler 01/2012 - R bulb/prox ICA mild-mod amt of fibrous plaque, L subclavian 70-99% diameter reduction, L bulb/ICA mild amt of fibrous plaque  . Chronic low back pain   . Dyslipidemia, goal LDL below 70   . Hypertension, essential   . Non-ST elevated myocardial infarction (non-STEMI) ([place]) 01/2012   SVG-diagonal occluded, 50% distal left main, 70-80% proximal LAD and 90% mid. Patent LIMA with 70% lesion post anastomosis. SVG to RCA occluded, SVG-OM2-3 90% sequential limb stenosis -- PCI SVG-OM 2 -OM 3: Xience Xpedition DES 2.75 MM x15 MM (3 MM)   . S/P CABG x 5 1998   LIMA-LAD, SVG-OM2-OM3; SVG-rPDA, SVG-D1  . S/P cardiac catheterization 06/28/2013   Cres Angina: a) 60% dLM; 70% Ostial LAD; 90% Cx - no OMs seen; 100% RCA;; SVG-D1 & SVG--dRCA occluded; Patent LIMA-LAD with dLAD ~70-80%;; SVG-OM2-OM3: 95% proximal, 50% mid, Diffuse ISR in BMS-OM2 & DES in Seq limb-OM3;; b) PCI:  prox SVG-OM-OM - Resolute DES 3.0 mm x 26 mm (3.3 mm); PTCA (Angiosculpt) to ISR in Seq limb - ~3.0 mm.; c)  Myoview 08/25/13 - small non-T-mural Inf scar, No Ischemia  . Stented coronary artery 2003; 01/2012; 05/2013   a) '03 BMS SVG-OM2-OM3 (initial limb into OM2);; b) 2013 - DES to Seq Lmg SVG-OM2-OM3 - Xience DES;; c) 05/2013 Prox SVG--OM2-OM3 Resolute DES 3.0 mm x 26 mm; Angiosculpt PTCA of ISR in Seq Limb (3.0 mm)    Past Surgical  History:  Procedure Laterality Date  . CATARACT EXTRACTION W/ INTRAOCULAR LENS  IMPLANT, BILATERAL Bilateral   . CORONARY ANGIOPLASTY WITH STENT PLACEMENT  2003   BMS 3.0 mm x 8 mm stent to SVG to OM & cuttin balloon to diagonal; 100% occluded vein graft to PDA, patent LIMA; 80% stenosis in SVG-diagnal; vein graft stenosis in OM  . CORONARY ARTERY BYPASS GRAFT  1998   SVG-diagonal, DVG-OM, DVG-PDA, LIMA-LAD  . ESOPHAGOGASTRODUODENOSCOPY N/A 06/29/2013   Procedure: ESOPHAGOGASTRODUODENOSCOPY (EGD);  Surgeon: Beryle Beams, MD;  Location[facility] ENDOSCOPY;  Service: Endoscopy;  Laterality: N/A;  . LEFT HEART CATHETERIZATION WITH CORONARY/GRAFT ANGIOGRAM N/A 01/13/2012   Procedure: LEFT HEART CATHETERIZATION WITH Beatrix Fetters;  Surgeon: Lorretta Harp, MD;  Location: [facility] CATH LAB;  Service: Cardiovascular: PCI & stenting of SVG-OM2 to OM3 segment with Xience Xpedition 2.43mmx15mm (3.75mm) DES  . LEFT HEART CATHETERIZATION WITH CORONARY/GRAFT ANGIOGRAM N/A 06/28/2013   Procedure: LEFT HEART CATHETERIZATION WITH Beatrix Fetters;  Surgeon: Troy Sine, MD;  Location: [facility] CATH LAB;  Service: Cardiovascular: Patent LIMA-LAD (progression of distal LAD ~80%, SVG-Diag, SVG-RCA 100%. PCI to SVG-CxOM2-OM3 99% prox lesion (Resolute DES 3.0 x 26 - 3.4 mm) & PTCA of  95% ISR in sequential limb (3.0 mm Angiosculpt)   . NM MYOVIEW LTD  07/2013   Test looked good!! Evidence of possible old MI / scar, but nothing to suggest ischemia. Not Gated b/c ectopy .  [name] NM MYOVIEW LTD  06/2017   Intermediate risk because of reduced EF but stable.  Stable fixed inferior defect consistent with either diaphragmatic attenuation or inferior infarct.  Septal hypokinesis/dyskinesis due to LBBB and prior CABG.  EF is 46%.  Stable compared to 2015 following PCI  . TONSILLECTOMY      Current Meds  Medication Sig  . amLODipine-benazepril (LOTREL) 10-20 MG capsule TAKE 1 CAPSULE BY MOUTH ONCE DAILY  . atenolol  (TENORMIN) 50 MG tablet TAKE ONE TABLET BY MOUTH ONCE DAILY  . hydrochlorothiazide (MICROZIDE) 12.5 MG capsule Take 1 capsule (12.5 mg total) by mouth as needed.  . isosorbide mononitrate (IMDUR) 60 MG 24 hr tablet Take 1 tablet (60 mg total) by mouth daily.  . nitroGLYCERIN (NITROSTAT) 0.4 MG SL tablet Place 1 tablet (0.4 mg total) under the tongue every 5 (five) minutes as needed (up to 3 doses).  . pantoprazole (PROTONIX) 40 MG tablet Take 1 tablet (40 mg total) by mouth daily.  . pravastatin (PRAVACHOL) 40 MG tablet TAKE ONE TABLET BY MOUTH ONCE DAILY  . ticagrelor (BRILINTA) 90 MG TABS tablet Take 1 tablet (90 mg total) by mouth 2 (two) times daily.    Allergies  Allergen Reactions  . Keflex [Cephalexin] Other (See Comments)    Unknown   . Zocor [Simvastatin] Other (See Comments)    Unknown   . Lipitor [Atorvastatin] Other (See Comments)    Elevated blood sugar  . Sulfa Antibiotics Nausea Only    Social History   Tobacco Use  . Smoking status: Former Smoker    Packs/day: 2.00    Years: 30.00    Pack years: 60.00    Types: Cigarettes    Last attempt to quit: 03/02/1990    Years since quitting: 27.5  . Smokeless tobacco: Never Used  Substance Use Topics  . Alcohol use: Yes    Comment: 06/28/2013 "aien't drank in years; used to drink a little beer"  . Drug use: No   Social History   Social History Narrative   Retired Married father of 2, grandfather tube.  Exercises routinely walking a roughly an hour a day.  Does not smoke does not drink.  He quit smoking many years ago.    family history includes Cancer in his mother; Heart attack in his father.  Wt Readings from Last 3 Encounters:  09/17/17 171 lb 4 oz (77.7 kg)  07/14/17 176 lb (79.8 kg)  01/14/17 176 lb (79.8 kg)    PHYSICAL EXAM BP 124/66   Pulse 67   Ht 5\' 11"  (1.803 m)   Wt 171 lb 4 oz (77.7 kg)   SpO2 95%   BMI 23.88 kg/m  Physical Exam  Constitutional: He appears well-developed and well-nourished.  No distress.  Healthy-appearing.  He actually seems younger than his stated age.  Well-groomed  HENT:  Head: Normocephalic and atraumatic.  Hard of hearing  Neck: No hepatojugular reflux and no JVD present. Carotid bruit is not present. No thyromegaly present.  Cardiovascular: Normal rate, normal heart sounds and normal pulses. An irregular  rhythm present.  Occasional extrasystoles (Rare) are present. PMI is not displaced. Exam reveals no gallop.  No murmur heard. Pulmonary/Chest: Effort normal and breath sounds normal. No respiratory distress. He has no wheezes. He has no rales.  Abdominal: Soft. Bowel sounds are normal. He exhibits no distension. There is no tenderness.  Lymphadenopathy:    He has no cervical adenopathy.  Nursing note and vitals reviewed.    Adult ECG Report N/a   Other studies Reviewed: Additional studies/ records that were reviewed today include:  Recent Labs:   Lab Results  Component Value Date   CREATININE 1.07 06/24/2017   BUN 10 06/24/2017   NA 143 06/24/2017   K 4.2 06/24/2017   CL 105 06/24/2017   CO2 22 06/24/2017   Lab Results  Component Value Date   CHOL 122 06/24/2017   HDL 43 06/24/2017   LDLCALC 62 06/24/2017   TRIG 83 06/24/2017   CHOLHDL 2.8 06/24/2017   Lab Results  Component Value Date   HGBA1C 6.4 (H) 06/28/2013    ASSESSMENT / PLAN: Problem List Items Addressed This Visit    Stable angina (HCC) (Chronic)    Stable DOE with no use of NTG in >6 months on current dose of Imdur, Beta Blocker & CCB.      Presence of drug coated stent in left circumflex coronary artery (Chronic)    Converted to Brilinta after last PCI.  Is probably okay to reduce to 60 mg maintenance dose. No longer on aspirin.  OK to hold Brilinta 5 to 7 days preprocedure/operation.      Orthostatic hypotension   History of: NSTEMI (non-ST elevated myocardial infarction) (Chronic)    Stable fixed inferior defect.  Has been called diaphragmatic attenuation,  but this is clearly an infarct with reduced EF and wall motion abnormality.  He has an occluded native right coronary artery and SVG to the RCA.  No real CHF symptoms.   stable exertional dyspnea may be class I angina symptoms.  Continue current meds.      Essential hypertension (Chronic)    Blood pressure looks good on current meds.  Still only using HCTZ as needed basis. Would not want to be too aggressive because of orthostatic hypotension symptoms.  Seems to be stable now since backing off on HCTZ.      Dyslipidemia, goal LDL below 70 (Chronic)    His lipids look very well controlled as of April on the current dose of pravastatin.  Would like to see LDL less than 50, but has been stable now with no real symptoms.  Would prefer to stay on pravastatin as opposed to convert and backtrack because of adverse effects.  Will order follow-up lipid panel for next time in roughly April next year and then follow-up in May.      Relevant Orders   Lipid panel   Comprehensive metabolic panel   Coronary artery disease involving coronary bypass graft of native heart with angina pectoris (HCC) (Chronic)    Now - s/p multiple PCIs to only remaining SVG (known CTO of SVG_ Diag & RCA) --> f/u Myoview Non-ischemic with existing scar.  On stable dose of Beta Blocker (doing well on Atenolol - no need to change), Imdur 60 mg & Amlodipine(-Lisinopril) 10mg (-20 mg) for anginal relief.  Euvolemic on exam - only on HCTZ      CAD, multiple vessel;  CABG in 1998; CTO SVG-RCA & SVG-Diag, multiple PCI SVG-OM, patent LIMA-LAD with dLAD 80% - Primary (Chronic)  Is here for six-month follow-up after 4-year follow-up Myoview stress test.  Thankfully, this test looks pretty much the same as his most recent post PCI follow-up Myoview in 2015 -> confirms old inferior scar as well as septal dyskinesis due to LBBB.  EF is stable at roughly 46%.   He does have chronic class I dyspnea/angina but well controlled.  Not many  new vascular station options available with a nonischemic stress test plan:.   Continue Brilinta (reduce to 60 mg -- will call & change f/u Rx) without ASA  -> OK to hold for procedures/operations 5-7 days pre-op.  Continue Beta blocker & Imdur & CCB (for anti-angina)  Continue statin      Relevant Orders   Lipid panel   Comprehensive metabolic panel      Current medicines are reviewed at length with the patient today. (+/- concerns) n/a The following changes have been made: n/a  Patient Instructions  Medication Instructions: Dr Ellyn Hack recommends that you continue on your current medications as directed. Please refer to the Current Medication list given to you today.  Labwork: Your physician recommends that you return for lab work in [place].  Testing/Procedures: NONE ORDERED  Follow-up: Dr Ellyn Hack recommends that you schedule a follow-up appointment in MAY 2020. You will receive a reminder letter in the mail two months in advance. If you don't receive a letter, please call our office to schedule the follow-up appointment.  If you need a refill on your cardiac medications before your next appointment, please call your pharmacy.   Studies Ordered:   Orders Placed This Encounter  Procedures  . Lipid panel  . Comprehensive metabolic panel      Glenetta Hew, M.D., M.S. Interventional Cardiologist   Pager # [phone] Phone # [phone] [address] [place], [place] [address]

## 2017-09-17 NOTE — Patient Instructions (Addendum)
Medication Instructions: Dr Ellyn Hack recommends that you continue on your current medications as directed. Please refer to the Current Medication list given to you today.  Labwork: Your physician recommends that you return for lab work in [place].  Testing/Procedures: NONE ORDERED  Follow-up: Dr Ellyn Hack recommends that you schedule a follow-up appointment in MAY 2020. You will receive a reminder letter in the mail two months in advance. If you don't receive a letter, please call our office to schedule the follow-up appointment.  If you need a refill on your cardiac medications before your next appointment, please call your pharmacy.

## 2017-09-19 ENCOUNTER — Other Ambulatory Visit: Payer: Self-pay | Admitting: Cardiology

## 2017-09-19 ENCOUNTER — Encounter: Payer: Self-pay | Admitting: Cardiology

## 2017-09-19 NOTE — Assessment & Plan Note (Signed)
Now - s/p multiple PCIs to only remaining SVG (known CTO of SVG_ Diag & RCA) --> f/u Myoview Non-ischemic with existing scar.  On stable dose of Beta Blocker (doing well on Atenolol - no need to change), Imdur 60 mg & Amlodipine(-Lisinopril) 10mg (-20 mg) for anginal relief.  Euvolemic on exam - only on HCTZ

## 2017-09-19 NOTE — Assessment & Plan Note (Addendum)
His lipids look very well controlled as of April on the current dose of pravastatin.  Would like to see LDL less than 50, but has been stable now with no real symptoms.  Would prefer to stay on pravastatin as opposed to convert and backtrack because of adverse effects.  Will order follow-up lipid panel for next time in roughly April next year and then follow-up in May.

## 2017-09-19 NOTE — Assessment & Plan Note (Signed)
Stable fixed inferior defect.  Has been called diaphragmatic attenuation, but this is clearly an infarct with reduced EF and wall motion abnormality.  He has an occluded native right coronary artery and SVG to the RCA.  No real CHF symptoms.   stable exertional dyspnea may be class I angina symptoms.  Continue current meds.

## 2017-09-19 NOTE — Assessment & Plan Note (Signed)
Stable DOE with no use of NTG in >6 months on current dose of Imdur, Beta Blocker & CCB.

## 2017-09-19 NOTE — Assessment & Plan Note (Signed)
Blood pressure looks good on current meds.  Still only using HCTZ as needed basis. Would not want to be too aggressive because of orthostatic hypotension symptoms.  Seems to be stable now since backing off on HCTZ.

## 2017-09-19 NOTE — Assessment & Plan Note (Signed)
Is here for six-month follow-up after 4-year follow-up Myoview stress test.  Thankfully, this test looks pretty much the same as his most recent post PCI follow-up Myoview in 2015 -> confirms old inferior scar as well as septal dyskinesis due to LBBB.  EF is stable at roughly 46%.   He does have chronic class I dyspnea/angina but well controlled.  Not many new vascular station options available with a nonischemic stress test plan:.   Continue Brilinta (reduce to 60 mg -- will call & change f/u Rx) without ASA  -> OK to hold for procedures/operations 5-7 days pre-op.  Continue Beta blocker & Imdur & CCB (for anti-angina)  Continue statin

## 2017-09-19 NOTE — Assessment & Plan Note (Signed)
Converted to [place] after last PCI.  Is probably okay to reduce to 60 mg maintenance dose. No longer on aspirin.  OK to hold Brilinta 5 to 7 days preprocedure/operation.

## 2017-10-12 ENCOUNTER — Other Ambulatory Visit: Payer: Self-pay | Admitting: Cardiology

## 2017-11-03 ENCOUNTER — Telehealth: Payer: Self-pay | Admitting: Cardiology

## 2017-11-03 NOTE — Telephone Encounter (Signed)
sawaiting forms checked both fax machine

## 2017-11-03 NOTE — Telephone Encounter (Signed)
° °  Patients spouse calling, states an assistance form from [employer] re:  ticagrelor (BRILINTA) 90 MG TABS tablet being faxed to office today

## 2017-11-04 NOTE — Telephone Encounter (Signed)
CALLED AND SPOKE TO WIFE- INFORMED HER FORMS HAVE NOT ARRIVED . OFFICE FAX NUMBER GIVEN FOR WIFE TO GIVE T [facility].  SHE STATES SHE WILL CALL COMPANY.

## 2017-11-08 ENCOUNTER — Telehealth: Payer: Self-pay | Admitting: Cardiology

## 2017-11-08 NOTE — Telephone Encounter (Signed)
New message   Patient's wife states that she will call [employer] about the free Brilinta medication for her husband. Patient's wife states that she will contact Dr. Allison Quarry office to verify that the fax was received.

## 2017-11-11 NOTE — Telephone Encounter (Signed)
°*  STAT* If patient is at the pharmacy, call can be transferred to refill team.   1. Which medications need to be refilled? (please list name of each medication and dose if known) need a new prescription for Brilinta  2. Which pharmacy/location (including street and city if local pharmacy) is medication to be sent to? [place] [facility]- Fax#-[phone]  3. Do they need a 30 day or 90 day supply? 180 and refills

## 2017-11-11 NOTE — Telephone Encounter (Signed)
Spoke to wife informed her prescription will be faxed on 11/15/17 once Dr Ellyn Hack SIGNS PRESCRIPTION( IF SAMPLE ARE AVAILABLE)  WILL CONTACT [place] [facility]

## 2017-11-12 MED ORDER — BRILINTA 90 MG PO TABS: 90.0000 mg | ORAL_TABLET | Freq: Two times a day (BID) | ORAL | 3 refills | Status: DC

## 2017-11-12 NOTE — Telephone Encounter (Signed)
SPOKE TO REPRESENTATIVE   AWAITED 30 MIN.   PATIENT'S ID # FOR COMPANY OBTAINED---- [id number]  PRESCRIPTION IS AWAITING SIGNATURE TO BE FAXED.    also wife aware-samples  Available for pick up

## 2017-11-18 NOTE — Telephone Encounter (Signed)
PRESCRIPTION FAXED ON 11/15/17

## 2017-12-28 ENCOUNTER — Other Ambulatory Visit: Payer: Self-pay

## 2017-12-28 ENCOUNTER — Encounter: Payer: Self-pay | Admitting: *Deleted

## 2017-12-28 NOTE — Addendum Note (Signed)
Addended by: Quinn Plowman E on: 12/28/2017 12:40 PM   Modules accepted: Orders

## 2017-12-28 NOTE — Patient Outreach (Signed)
[facility]) Care Management  12/28/2017  Xavier Giles Jan 26, 1941 [id number]    TELEPHONE SCREENING Referral date: 12/20/17 Referral source: family referral Referral reason: medication review Insurance: Medicare  Telephone call to patient regarding family referral. HIPAA verified. Patient gave verbal authorization to speak with his daughter, [name] regarding his health information. Patient state, "I'm ok with my medicines but I can't afford my Brilinta.   Patient reports having high blood pressure and high cholesterol.  Patient states he has had bypass surgery in the past.  Patient verbalized his awareness that his daughter would like to have his medications reviewed. Patient states he is also hard of hearing.   RNCM discussed and offered [facility] care management services. Patient verbally agreed.   PLAN:  RNCM will refer patient to [facility] pharmacist.  RNCM will send patient [facility] welcome packet and 24 hour nurse line magnet.   Quinn Plowman RN,BSN,CCM [facility] Telephonic  [phone]

## 2017-12-29 ENCOUNTER — Other Ambulatory Visit: Payer: Self-pay | Admitting: Cardiology

## 2017-12-29 MED ORDER — TICAGRELOR 90 MG PO TABS: 90.0000 mg | ORAL_TABLET | Freq: Two times a day (BID) | ORAL | 1 refills | Status: DC

## 2017-12-29 NOTE — Telephone Encounter (Signed)
New Message     *STAT* If patient is at the pharmacy, call can be transferred to refill team.   1. Which medications need to be refilled? (please list name of each medication and dose if known) Brilinta   2. Which pharmacy/location (including street and city if local pharmacy) is medication to be sent to? [place], [place] - [address]  3. Do they need a 30 day or 90 day supply? 90 day supply

## 2017-12-30 ENCOUNTER — Other Ambulatory Visit: Payer: Self-pay | Admitting: Pharmacist

## 2017-12-30 ENCOUNTER — Ambulatory Visit: Payer: Self-pay | Admitting: Pharmacist

## 2017-12-30 ENCOUNTER — Telehealth: Payer: Self-pay | Admitting: Cardiology

## 2017-12-30 NOTE — Telephone Encounter (Signed)
SPOKE TO DAUGHTER,  CONDOLENCE FOR HER MOTHER'S PASSING  GIVEN.  DAUGHTER WANTED TO KNOW WHAT IS NEEDED FOR PATIENT TO CONTINUE WITH GETTING SAMPLES OF BRILINTA.   RN INFORMED HER THAT THE INF SHOULD BE ON MEDICATION BOTTLE - CALL COMPANY AN THEY SHOULD SHIP OUT NEXT BOTTLE IF  ASSISTANCE IS NEEDED CONTACT OFFICE BACK.

## 2017-12-30 NOTE — Telephone Encounter (Signed)
New Message:    Daughter called and said pt usually get pt assistance with his Brilinta. She needs to know what she needs to do. Wife used to take care of everything, she passed away.

## 2017-12-30 NOTE — Patient Outreach (Signed)
[facility]) Care Management  [place]  12/30/2017  Xavier Giles 1940-11-01 [id number]   Reason for referral: medication assistance   Unsuccessful telephone call attempt #1 to patient.   HIPAA compliant voicemail left requesting a return call left on daughter's mobile number.  Plan:  I will make another outreach attempt to patient within 3-4 business days  I will follow up with daughter tomorrow  [name], PharmD, [place]  [phone]

## 2017-12-31 ENCOUNTER — Telehealth: Payer: Self-pay | Admitting: Cardiology

## 2017-12-31 ENCOUNTER — Ambulatory Visit: Payer: Self-pay | Admitting: Pharmacist

## 2017-12-31 ENCOUNTER — Other Ambulatory Visit: Payer: Self-pay | Admitting: Pharmacist

## 2017-12-31 ENCOUNTER — Other Ambulatory Visit: Payer: Self-pay | Admitting: Pharmacy Technician

## 2017-12-31 NOTE — Telephone Encounter (Signed)
Attempted to contact patient, unable to reach.   I received notification that patient needed Eliquis, but patient is on Brilinta medication.   I contacted PharmD with [facility] to advise that I had samples of the Brilinta. They were placed up front for patients daughter to come by and get. Patient is working on getting his patient assistance sent in for the medication and should be approved soon.   Medication given: Brilinta 90 mg Lot #: [id number] Expiration: 06/2020 Qty: 4 bottles

## 2017-12-31 NOTE — Patient Outreach (Signed)
[place] [facility]) Care Management  12/31/2017  Xavier Giles 11-Dec-1940 [id number]   Informed per [facility] RPh Lottie Dawson that patient is running low on Brilinta and patient's wife has recently passed away and daughter, Xavier Giles will be taking over coordination of his medication. Patient is due to reapply for his Brilinta patient assistance. Faxed provider portion to [name] for completion and give patient portion to daughter Xavier Giles.   Will submit application once all documents have been received.  [name] Beola Cord Certified Pharmacy Technician [facility] Care Management [phone]

## 2017-12-31 NOTE — Patient Outreach (Signed)
[place] [facility]) Care Management  [place]   12/31/2017  Xavier Giles 1940-06-16 [id number]   Reason for referral: medication assistance/management  Referral source: [name]  Referral medication: Brilinta Current insurance:medicare/no part D  PMHx:  CAD, NSTEMI, HTN, HDL   HPI: Successful outreach call to Ms. Xavier Giles (patient's daughter) with HIPAA identifiers verified x2.  Xavier Giles states that she was not primarily taking care of patient's medications, however family was getting Brilinta annually through the patient assistance program via [employer].  It appears patient does not have Part D insurance based on chart review & search.  Xavier Giles states that her father only has a few days worth of Brilinta and that the prescription on file has expired with [place] PAP.  Notified [name], [name] to assist with this process. New PAP application to be started.  [facility] to be called for back up samples and faxing of RX.   All other medications will be reviewed at upcoming visit.  Patient uses a pill box.  Xavier Giles states that there are no issues obtaining all other medications.  [facility] Pharmacist to assess compliance and appropriateness of medications at that time.  Objective: Allergies  Allergen Reactions  . Keflex [Cephalexin] Other (See Comments)    Unknown   . Zocor [Simvastatin] Other (See Comments)    Unknown   . Lipitor [Atorvastatin] Other (See Comments)    Elevated blood sugar  . Sulfa Antibiotics Nausea Only    Medications Reviewed Today    Reviewed by [name], MD (Physician) on 09/19/17 at 1615  Med List Status: <None>  Medication Order Taking? Sig Documenting Provider Last Dose Status Informant  amLODipine-benazepril (LOTREL) 10-20 MG capsule [id number] Yes TAKE 1 CAPSULE BY MOUTH ONCE DAILY [name], MD Taking Active   atenolol (TENORMIN) 50 MG tablet [id number] Yes TAKE ONE TABLET BY MOUTH ONCE DAILY [name],  MD Taking Active   hydrochlorothiazide (MICROZIDE) 12.5 MG capsule [id number] Yes Take 1 capsule (12.5 mg total) by mouth as needed. [name], MD Taking Active   isosorbide mononitrate (IMDUR) 60 MG 24 hr tablet [id number] Yes Take 1 tablet (60 mg total) by mouth daily. [name], MD Taking Expired 09/17/17 2359   nitroGLYCERIN (NITROSTAT) 0.4 MG SL tablet [id number] Yes Place 1 tablet (0.4 mg total) under the tongue every 5 (five) minutes as needed (up to 3 doses). [name], [name] Taking Active   pantoprazole (PROTONIX) 40 MG tablet [id number] Yes Take 1 tablet (40 mg total) by mouth daily. [name], [name] Taking Active   pravastatin (PRAVACHOL) 40 MG tablet [id number] Yes TAKE ONE TABLET BY MOUTH ONCE DAILY [name], MD Taking Active   ticagrelor [facility]) 90 MG TABS tablet [id number] Yes Take 1 tablet (90 mg total) by mouth 2 (two) times daily. [name], MD Taking Active           Assessment:   Medication Review Findings:  . Medications to be reviewed during home visit scheduled for 11/6/at 10am . Patient only has a few days worth of Brilinta remaining-->patient assistance will be initiated, however PAPs will take time.  Call placed to [place] to inquire about refills to bridge patient to Brilinta PAP via [place]  Medication Assistance Findings:  Extra Help:   '[]'$  Already receiving Full Extra Help  '[]'$  Already receiving Partial Extra Help  '[]'$  Eligible based on reported income and assets  '[x]'$  Not  Eligible based on reported income and assets  Patient Assistance Programs: 1) Brilinta made by [employer] o Income requirement met: '[x]'$  Yes '[]'$  No '[]'$  Unknown o Out-of-pocket prescription expenditure met:    '[]'$  Yes '[]'$  No  '[x]'$  Unknown  '[]'$  Not applicable   Plan: -I will route patient assistance letter to [place] technician who will coordinate patient assistance program application process for medications listed above.  [name] pharmacy  technician will assist with obtaining all required documents from both patient and provider(s) and submit application(s) once completed.   -Message left with [name] regarding samples-->samples #3 boxes to be left at front window at [facility] per [name] at the practive -[place] home visit scheduled for WED 11/6 at 10am  [name], PharmD, [place]  [phone]

## 2017-12-31 NOTE — Telephone Encounter (Signed)
New message ° ° ° ° °Patient calling the office for samples of medication: ° ° °1.  What medication and dosage are you requesting samples for? Eliquis ° °2.  Are you currently out of this medication? No  ° ° °

## 2018-01-05 ENCOUNTER — Ambulatory Visit: Payer: Self-pay | Admitting: Pharmacist

## 2018-01-05 ENCOUNTER — Other Ambulatory Visit: Payer: Self-pay | Admitting: Pharmacist

## 2018-01-05 NOTE — Patient Outreach (Signed)
[place] [facility]) Care Management  [place]   01/05/2018  AZARIAH BONURA 1940-10-18 [id number]  Reason for referral: medication assistance  Referral source: [facility] telephonic Referral medication(s): Brilinta Current insurance: Medicare  PMHx: CAD, HLD, HTN, NSTEMI   HPI: Successful [facility] CM Pharmacy home visit with Mr. Brozek with HIPAA identifiers verified x2.   Medications were neatly organized in pillbox on kitchen table.  Patient was able to verbalize all medications and purpose. Patient currently has a 20 day supply of Brilinta samples.  [name], [name] is assisting with the [place] patient assistance program.  Patient has previously been enrolled with [employer] PAP for the past year, however the program has now expired (patient has no part D, therefore program does not continue through the end of the year).  Patient denies SOB or side effects from Brilinta or other medications.  Patient states that his medications are mostly affordable, however pravastatin (#30) and amlodipine/benazepril combination (#90) are the most costly of his medications.  Patient does not currently have a Part D plan.  Will research plans to determine if patient would be an appropriate candidate for a Part D plan.   Objective: Allergies  Allergen Reactions  . Keflex [Cephalexin] Other (See Comments)    Unknown   . Zocor [Simvastatin] Other (See Comments)    Unknown   . Lipitor [Atorvastatin] Other (See Comments)    Elevated blood sugar  . Sulfa Antibiotics Nausea Only    Medications Reviewed Today    Reviewed by Lavera Guise, [facility] (Pharmacist) on 01/05/18 at 1330  Med List Status: <None>  Medication Order Taking? Sig Documenting Provider Last Dose Status Informant  amLODipine-benazepril (LOTREL) 10-20 MG capsule [id number] Yes TAKE 1 CAPSULE BY MOUTH ONCE DAILY [name], MD Taking Active   atenolol (TENORMIN) 50 MG tablet [id number] Yes TAKE 1 TABLET BY MOUTH  ONCE DAILY [name], MD Taking Active   isosorbide mononitrate (IMDUR) 60 MG 24 hr tablet [id number]  Take 1 tablet (60 mg total) by mouth daily. [name], MD  Expired 09/17/17 2359   isosorbide mononitrate (IMDUR) 60 MG 24 hr tablet [id number] Yes TAKE 1 TABLET BY MOUTH ONCE DAILY [name], MD Taking Active   nitroGLYCERIN (NITROSTAT) 0.4 MG SL tablet [id number] Yes Place 1 tablet (0.4 mg total) under the tongue every 5 (five) minutes as needed (up to 3 doses). [name], [name] Taking Active   pantoprazole (PROTONIX) 40 MG tablet [id number] Yes Take 1 tablet (40 mg total) by mouth daily. [name], [name] Taking Active   pravastatin (PRAVACHOL) 40 MG tablet [id number] Yes TAKE 1 TABLET BY MOUTH ONCE DAILY [name], MD Taking Active   ticagrelor (BRILINTA) 90 MG TABS tablet [id number] Yes Take 1 tablet (90 mg total) by mouth 2 (two) times daily. [name], MD Taking Active           Assessment:  Drugs sorted by system:  Neurologic/Psychologic:  Cardiovascular: atenolol, amlodipine/benazepril, Brilinta, isosorbide, nitroglycerin PRN, pravasatin  Gastrointestinal: patntoprazole  Genitourinary:  Saw [place]  Medication Review Findings:  . Brilinta samples ~20 days remaining.   Medication Assistance Findings:  Extra Help:   '[]'$  Already receiving Full Extra Help  '[]'$  Already receiving Partial Extra Help  '[]'$  Eligible based on reported income and assets  '[x]'$  Not Eligible based on reported income and assets  Patient Assistance Programs: 1) Brilinta made by [place] requirement met: '[x]'$  Yes '[]'$   No '[]'$  Unknown o Out-of-pocket prescription expenditure met:    '[x]'$  Yes '[]'$  No  '[]'$  Unknown  '[]'$  Not applicable Patient Assistance application already in progress  Current Patient Assistance Information: [place] # [id number] Pharmacy services [phone] & [phone]         PLAN: -Will follow up with [facility] CPhT, [name] who  plans to reach out to [place] and [place] for additional samples and to expedite plan -Will follow up with patient & daughter regarding Medicare Part D plans  [name], PharmD, [place]  [phone]

## 2018-01-06 ENCOUNTER — Other Ambulatory Visit: Payer: Self-pay | Admitting: Pharmacy Technician

## 2018-01-06 NOTE — Patient Outreach (Signed)
[place] [facility]) Care Management  01/06/2018  Xavier Giles 1940/10/08 [id number]  Care coordination call place to AZ[employer]ME to inquire about the renewal date of the patient's medication assistance for Brilinta (Not Part D) and to inquire if they had received the provider documents for the Brilinta.  Spoke to [place] who stated that 60 tabs of Brilinta was shipped on 12/29/17. He should receive in 7-10 business days. She also stated that the patient's end date for this program was on 03/01/2018. I asked her to verify both pieces of information as patient is not part D and if his renewal date is the end of the year then why was he not sent any more medication than a 30 days supply.  [place] placed me on a brief hold but was unable to provide me with an answer to those questions. She said she sent an email to her manager to find the answer. She suggested I call back in 2-3 business days to find out the answer.  Will route note to [place] for followup.  Bobbe Quilter P. Lala Been, [place] Management [phone]

## 2018-01-10 ENCOUNTER — Other Ambulatory Visit: Payer: Self-pay | Admitting: Pharmacist

## 2018-01-10 ENCOUNTER — Ambulatory Visit: Payer: Self-pay | Admitting: Pharmacist

## 2018-01-13 NOTE — Patient Outreach (Signed)
[place] [facility]) Care Management  01/13/2018  Xavier Giles 04-12-1940 [id number]  Successful care coordination call to [employer]Me (patient assistance for Brilinta).  Company states that patient's [name] will arrive at patient's home in 7-10 business days.  PLAN: -I will route this information to [facility] CPhT, [name] to assist if additional follow up is needed.  [name], PharmD, [place]  [phone]

## 2018-01-14 ENCOUNTER — Other Ambulatory Visit: Payer: Self-pay | Admitting: Pharmacy Technician

## 2018-01-14 NOTE — Patient Outreach (Signed)
[place] [facility]) Care Management  01/14/2018  Xavier Giles April 20, 1940 [id number]   Faxed patient portion and required documents into [place] patient assistance for Brilinta. Wrote "Requesting needs expediting" on top of cover sheet.  Will follow up with company in 3-5 business days to check status of application.  [name] [place] Certified Pharmacy Technician [place] Management Direct Dial:[phone]

## 2018-01-14 NOTE — Patient Outreach (Signed)
[place] [facility]) Care Management  01/14/2018  CRIXUS MCAULAY [birth year] [id number]   Care coordination call placed to [employer]ME to check on the shipping status of patient's Brilinta.  Spoke to [place] who says she shows no shipping status. She says patient is not enrolled in the program and that the program ended for this patient on 08/31/17 (patient does not have Part D).  Informed [place] that I was told and my colleague was told that Brilinta had shipped on 12/29/17. [place] apologized profusely and said that was misinformation.  She said she had received a provider prescription on 2/54/2706 but no new application.  Tw[place]ette advised patient to fill out a new application and mark it as "urgent needs expediting" on the the cover sheet as concern was expressed to [place] regarding patient's supply of medication. Twainette also said that she has marked the case as expedited/urgent.   Will route note to [place] as Juluis Rainier and routed note to [place] for followup.  Alvar Malinoski P. Derrien Anschutz, [place] Management [phone]

## 2018-01-19 ENCOUNTER — Other Ambulatory Visit: Payer: Self-pay | Admitting: Pharmacist

## 2018-01-19 NOTE — Patient Outreach (Signed)
[place] [facility]) Care Management  01/19/2018  VAHE PIENTA 05/29/1940 [id number]   Successful outreach to [facility] on [place].  RN Caprice Beaver placed samples (4 bottles) of Brilinta at front desk for patient to sign for.  Patient is still awaiting PAP from AZ[employer]Me.  Application is still processing at this time.  PLAN: -[name], [name] is continuing to follow this case - I will follow up next week to check on status of PAP  [name], PharmD, [place]  [phone]

## 2018-01-21 ENCOUNTER — Other Ambulatory Visit: Payer: Self-pay | Admitting: Pharmacy Technician

## 2018-01-21 NOTE — Patient Outreach (Signed)
[place] [facility]) Care Management  01/21/2018  ELIGH RYBACKI Apr 07, 1940 [id number]    Follow up call to check status of Brilinta application. [place] states that application is still being processed and was not able to provide a turnaround time.   Will follow up with company in 7-10 business days (due to holiday) for update.  Updated patients daughter, [name] of application status.  [name] [place] Certified Pharmacy Technician [place] Management Direct Dial:[phone]

## 2018-02-01 ENCOUNTER — Other Ambulatory Visit: Payer: Self-pay | Admitting: Pharmacy Technician

## 2018-02-01 NOTE — Patient Outreach (Signed)
[facility]) Care Management  02/01/2018  Xavier Giles Oct 28, 1940 [id number]    Follow up call placed to [employer]ME patient assistance regarding patient assistance application for Brilinta, [name] stated that application is still under review.  Will follow up with company in 3-5 business days.  [name] [name] Certified Pharmacy Technician [place] Management Direct Dial:[phone]

## 2018-02-02 ENCOUNTER — Other Ambulatory Visit: Payer: Self-pay | Admitting: Pharmacist

## 2018-02-02 NOTE — Patient Outreach (Signed)
[place] [facility]) Care Management  02/02/2018  FAUST THORINGTON 06-Jul-1940 [id number]   Successful outreach to RN, Caprice Beaver at [facility] regarding medication samples.  1 month of Brilinta was placed at front desk for pick up.  Patient's daughter and [name], [name] were notified.  PLAN: -I will continue to follow this case.  [name], [name] continues to outreach to AZ[employer]Me regarding PAP status  [name], PharmD, [place]  [phone]

## 2018-02-07 ENCOUNTER — Other Ambulatory Visit: Payer: Self-pay | Admitting: Pharmacy Technician

## 2018-02-07 NOTE — Patient Outreach (Signed)
[place] [facility]) Care Management  02/07/2018  Xavier Giles [birth year] [id number]   Care coordination call placed to [employer] to inquire on the status of the patient's medication assistance application for Brilinta.   Spoke to [place] who said the application was still being reviewed. Caren Griffins said she would send an email to her manager and also put notes in requesting that a manager return my call and look at this application.  Will followup with [employer]ME in 2-3 business days fi call is not returned.  Xavier Giles P. Ivis Henneman, [place] Management [phone]

## 2018-02-11 ENCOUNTER — Other Ambulatory Visit: Payer: Self-pay | Admitting: Pharmacy Technician

## 2018-02-11 NOTE — Patient Outreach (Signed)
[place] [facility]) Care Management  02/11/2018  DREDEN RIVERE [birth year] [id number]   Care coordination call placed to [employer]ME in regards to patient's medication assistance application for Brilinta.  Spoke to [place] who said that they were missing the patient's proof of income. She stated the only pages that were documented on his account were the 3 pages of the application.Explained to [place] that it was frustrating as we have spoke to numerous people numerous times and no one has ever mentioned to [place] that information was missing (our confirmations show all the pages were received). Izora Gala said that if the incoming faxes are not marked as permanent on the patient's files then they somehow "disappear". She also stated that they went through a system updgrade/change last month and that some data was lost.  I refaxed the application and the patient's proof on income. Izora Gala stayed on the line and verified that she had received the information. She said she would send this information up to her manager and explain to them the urgency of this application as the patient is running low on the medication. Stressed to [place] that this is a very urgent case that should not have been handled in the matter that is has been handled and she agreed and apologized. Unfortunately, Izora Gala had no timeline as to when this application might possibly get processed.  Will route note to [place] for followup.  Cortlynn Hollinsworth P. Damarys Speir, [place] Management [phone]

## 2018-02-16 ENCOUNTER — Other Ambulatory Visit: Payer: Self-pay | Admitting: Pharmacy Technician

## 2018-02-16 NOTE — Patient Outreach (Signed)
[facility]) Care Management  02/16/2018  Xavier Giles 07-Oct-1940 [id number]   Care coordination call placed to [employer]ME in regards to patient's application for Brilinta.  Spoke to [place] who processed the application while I was on the phone. Xavier Giles was able to get the patient APPROVED from 02/16/18-02/16/19.  Xavier Giles informed me she will have the refill expedited to patient. Patient's medication should arrive within 3-5 business days via [employer].  Will route note to [place] for followup.  Safa Derner P. Josanne Boerema, [place] Management [phone]

## 2018-03-01 DIAGNOSIS — H43393 Other vitreous opacities, bilateral: Secondary | ICD-10-CM | POA: Diagnosis not present

## 2018-03-01 DIAGNOSIS — H40033 Anatomical narrow angle, bilateral: Secondary | ICD-10-CM | POA: Diagnosis not present

## 2018-03-14 ENCOUNTER — Other Ambulatory Visit: Payer: Self-pay | Admitting: Pharmacy Technician

## 2018-03-14 NOTE — Patient Outreach (Signed)
[place] [facility]) Care Management  03/14/2018  Xavier Giles 04-19-1940 [id number]    Patients daughter, [name] confirms that patient received his Brilinta from [employer]ME. Requested that she inform myself or [facility] RPh Lottie Dawson if there are any issues obtaining his refills.  Will route note to [place] for case closure.  [name] [place] Certified Pharmacy Technician [place] Management Direct [phone]

## 2018-04-06 ENCOUNTER — Other Ambulatory Visit: Payer: Self-pay | Admitting: Pharmacist

## 2018-04-06 NOTE — Patient Outreach (Signed)
[place] [facility]) Care Management [place]  04/06/2018  NHAT HEARNE October 23, 1940 [id number]  Reason for referral: medication assistance  [facility] pharmacy case is being closed due to the following reasons:  Goals have been met.  Patient has been provided [facility] CM contact information if assistance needed in the future.    Thank you for allowing [facility] pharmacy to be involved in this patient's care.      [name], PharmD, [place]  [phone]

## 2018-05-06 ENCOUNTER — Telehealth: Payer: Self-pay | Admitting: Cardiology

## 2018-05-06 NOTE — Telephone Encounter (Signed)
° ° °  Pt c/o of Chest Pain: STAT if CP now or developed within 24 hours  1. Are you having CP right now? N/a per [name]  2. Are you experiencing any other symptoms (ex. SOB, nausea, vomiting, sweating)? Congestion, chest tightness  3. How [place] have you been experiencing CP? 2 weeks  4. Is your CP continuous or coming and going? Coming and going  5. Have you taken Nitroglycerin? Unsure per daughter ?

## 2018-05-06 NOTE — Telephone Encounter (Signed)
Spoke with pt's daughter who states he father has been experiencing some chest tightness and congestion off and on. She states when she spoke with him he was still having the chest tightness and describe symptoms as similar to when he had a stent placed. Daughter advised to have pt report to ED. Daughter states pt will probably decline going to the ED and would like to make a sooner appointment. Appointment scheduled for 3/10 with Jory Sims, DNP and again encouraged him to go to ED for further evaluations.

## 2018-05-07 NOTE — Progress Notes (Deleted)
Cardiology Office Note   Date:  05/07/2018   ID:  ELIYOHU CLASS, DOB Sep 27, 1940, MRN [id number]  PCP:  Denita Lung, MD  Cardiologist:  [name]  No chief complaint on file.    History of Present Illness: Xavier Giles is a 78 y.o. male who presents for ongoing assessment and management of CAD with hx of CABG in 1998.   (copied for accuracy)  2003: Abn ST -> PCI-SVG-OM (prox limb): 3.0 x 8 Zeta BMS- going into native OM2);; PTCA only native D1 through SVG; CTO SVG-RCA; patent LIMA  Nov 2013 - NSTEMI: SVG-RCA & SVG-Diag CTO.  Severe SVG => DES PCI (Xience)  April 2015: Unstable angina: Cardiac CATH: patent LIMA-LAD w/ progression of dLAD to ~80%.  PCI to SVG-CxOM2-OM3 99% prox lesion & PTCA of 95% ISR in sequential limb..   08/24/13: Myoview Stress Test post PCI looked good!! Evidence of possible old MI / scar, but nothing to suggest ischemia. Not Gated b/c ectopy .  -- No real change in f/u Myoview 06/2017  He was last seen in the office by [name] on 09/17/2017 for follow up. He was doing well at that time. He complained of minimal chest pain when he over exerted himself. He was continued on Brilinta reduced to 60 mg BID maintenance dose. He has a history of orthostatic hypotension and was taken off of HCTZ on prior visits. He is to have lipids and LFTs this office visit.   Past Medical History:  Diagnosis Date  . Asthma   . 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 CAD, multiple vessel 1998   Referred for CABG  . Carotid bruit present 01/2012   a. asymptomatic; carotid doppler 01/2012 - R bulb/prox ICA mild-mod amt of fibrous plaque, L subclavian 70-99% diameter reduction, L bulb/ICA mild amt of fibrous plaque  . Chronic Giles back pain   . Dyslipidemia, goal LDL below 70   . Hypertension,  essential   . Non-ST elevated myocardial infarction (non-STEMI) ([place]) 01/2012   SVG-diagonal occluded, 50% distal left main, 70-80% proximal LAD and 90% mid. Patent LIMA with 70% lesion post anastomosis. SVG to RCA occluded, SVG-OM2-3 90% sequential limb stenosis -- PCI SVG-OM 2 -OM 3: Xience Xpedition DES 2.75 MM x15 MM (3 MM)   . S/P CABG x 5 1998   LIMA-LAD, SVG-OM2-OM3; SVG-rPDA, SVG-D1  . S/P cardiac catheterization 06/28/2013   Cres Angina: a) 60% dLM; 70% Ostial LAD; 90% Cx - no OMs seen; 100% RCA;; SVG-D1 & SVG--dRCA occluded; Patent LIMA-LAD with dLAD ~70-80%;; SVG-OM2-OM3: 95% proximal, 50% mid, Diffuse ISR in BMS-OM2 & DES in Seq limb-OM3;; b) PCI:  prox SVG-OM-OM - Resolute DES 3.0 mm x 26 mm (3.3 mm); PTCA (Angiosculpt) to ISR in Seq limb - ~3.0 mm.; c)  Myoview 08/25/13 - small non-T-mural Inf scar, No Ischemia  . Stented coronary artery 2003; 01/2012; 05/2013   a) '03 BMS SVG-OM2-OM3 (initial limb into OM2);; b) 2013 - DES to Seq Lmg SVG-OM2-OM3 - Xience DES;; c) 05/2013 Prox SVG--OM2-OM3 Resolute DES 3.0 mm x 26 mm; Angiosculpt PTCA of ISR in Seq Limb (3.0 mm)    Past Surgical History:  Procedure Laterality Date  . CATARACT EXTRACTION W/ INTRAOCULAR LENS  IMPLANT, BILATERAL Bilateral   . CORONARY ANGIOPLASTY  WITH STENT PLACEMENT  2003   BMS 3.0 mm x 8 mm stent to SVG to OM & cuttin balloon to diagonal; 100% occluded vein graft to PDA, patent LIMA; 80% stenosis in SVG-diagnal; vein graft stenosis in OM  . CORONARY ARTERY BYPASS GRAFT  1998   SVG-diagonal, DVG-OM, DVG-PDA, LIMA-LAD  . ESOPHAGOGASTRODUODENOSCOPY N/A 06/29/2013   Procedure: ESOPHAGOGASTRODUODENOSCOPY (EGD);  Surgeon: Beryle Beams, MD;  Location[facility] ENDOSCOPY;  Service: Endoscopy;  Laterality: N/A;  . LEFT HEART CATHETERIZATION WITH CORONARY/GRAFT ANGIOGRAM N/A 01/13/2012   Procedure: LEFT HEART CATHETERIZATION WITH Beatrix Fetters;  Surgeon: Lorretta Harp, MD;  Location: [facility] CATH LAB;  Service:  Cardiovascular: PCI & stenting of SVG-OM2 to OM3 segment with Xience Xpedition 2.68mmx15mm (3.52mm) DES  . LEFT HEART CATHETERIZATION WITH CORONARY/GRAFT ANGIOGRAM N/A 06/28/2013   Procedure: LEFT HEART CATHETERIZATION WITH Beatrix Fetters;  Surgeon: Troy Sine, MD;  Location: [facility] CATH LAB;  Service: Cardiovascular: Patent LIMA-LAD (progression of distal LAD ~80%, SVG-Diag, SVG-RCA 100%. PCI to SVG-CxOM2-OM3 99% prox lesion (Resolute DES 3.0 x 26 - 3.4 mm) & PTCA of  95% ISR in sequential limb (3.0 mm Angiosculpt)   . NM MYOVIEW LTD  07/2013   Test looked good!! Evidence of possible old MI / scar, but nothing to suggest ischemia. Not Gated b/c ectopy .  [name] NM MYOVIEW LTD  06/2017   Intermediate risk because of reduced EF but stable.  Stable fixed inferior defect consistent with either diaphragmatic attenuation or inferior infarct.  Septal hypokinesis/dyskinesis due to LBBB and prior CABG.  EF is 46%.  Stable compared to 2015 following PCI  . TONSILLECTOMY       Current Outpatient Medications  Medication Sig Dispense Refill  . amLODipine-benazepril (LOTREL) 10-20 MG capsule TAKE 1 CAPSULE BY MOUTH ONCE DAILY 90 capsule 3  . atenolol (TENORMIN) 50 MG tablet TAKE 1 TABLET BY MOUTH ONCE DAILY 90 tablet 3  . isosorbide mononitrate (IMDUR) 60 MG 24 hr tablet Take 1 tablet (60 mg total) by mouth daily. 30 tablet 0  . isosorbide mononitrate (IMDUR) 60 MG 24 hr tablet TAKE 1 TABLET BY MOUTH ONCE DAILY 90 tablet 3  . nitroGLYCERIN (NITROSTAT) 0.4 MG SL tablet Place 1 tablet (0.4 mg total) under the tongue every 5 (five) minutes as needed (up to 3 doses). 25 tablet 3  . pantoprazole (PROTONIX) 40 MG tablet Take 1 tablet (40 mg total) by mouth daily. 30 tablet 12  . pravastatin (PRAVACHOL) 40 MG tablet TAKE 1 TABLET BY MOUTH ONCE DAILY 30 tablet 8  . ticagrelor (BRILINTA) 90 MG TABS tablet Take 1 tablet (90 mg total) by mouth 2 (two) times daily. 180 tablet 1   No current facility-administered  medications for this visit.     Allergies:   Keflex [cephalexin]; Zocor [simvastatin]; Lipitor [atorvastatin]; and Sulfa antibiotics    Social History:  The patient  reports that he quit smoking about 28 years ago. His smoking use included cigarettes. He has a 60.00 pack-year smoking history. He has never used smokeless tobacco. He reports current alcohol use. He reports that he does not use drugs.   Family History:  The patient's family history includes Cancer in his mother; Heart attack in his father.    ROS: All other systems are reviewed and negative. Unless otherwise mentioned in H&P    PHYSICAL EXAM: VS:  There were no vitals taken for this visit. , BMI There is no height or weight on file to calculate BMI. GEN: Well nourished,  well developed, in no acute distress HEENT: normal Neck: no JVD, carotid bruits, or masses Cardiac: ***RRR; no murmurs, rubs, or gallops,no edema  Respiratory:  Clear to auscultation bilaterally, normal work of breathing GI: soft, nontender, nondistended, + BS MS: no deformity or atrophy Skin: warm and dry, no rash Neuro:  Strength and sensation are intact Psych: euthymic mood, full affect   EKG:  EKG {ACTION; IS/IS [id number]} ordered today. The ekg ordered today demonstrates ***   Recent Labs: 06/24/2017: ALT 19; BUN 10; Creatinine, Ser 1.07; Potassium 4.2; Sodium 143    Lipid Panel    Component Value Date/Time   CHOL 122 06/24/2017 0830   TRIG 83 06/24/2017 0830   HDL 43 06/24/2017 0830   CHOLHDL 2.8 06/24/2017 0830   CHOLHDL 3.1 07/07/2016 0903   VLDL 24 07/07/2016 0903   LDLCALC 62 06/24/2017 0830      Wt Readings from Last 3 Encounters:  09/17/17 171 lb 4 oz (77.7 kg)  07/14/17 176 lb (79.8 kg)  01/14/17 176 lb (79.8 kg)      Other studies Reviewed: Study Highlights 07/14/2017   The left ventricular ejection fraction is mildly decreased (45-54%).  Nuclear stress EF: 46%.  This is an intermediate risk study due to  reduced systolic function.  Septal hypokinesis consistent with LBBB and prior CABG.  There is a fixed inferior defect most consistent with diaphragmatic attenuation and was also seen in 2015. Cannot rule out prior inferior infarct.     ASSESSMENT AND PLAN:  1.  ***   Current medicines are reviewed at length with the patient today.    Labs/ tests ordered today include: *** Phill Myron. [place] Pugh, ANP, AACC   05/07/2018 7:45 PM    [address] Office [phone] Fax [phone]

## 2018-05-10 ENCOUNTER — Ambulatory Visit: Payer: Medicare Other | Admitting: Adult Health

## 2018-07-11 ENCOUNTER — Telehealth: Payer: Self-pay

## 2018-07-11 NOTE — Telephone Encounter (Signed)
Call Patient to switch appoint to virtual visit, phone would continue to ring and hang up x2.

## 2018-07-13 NOTE — Telephone Encounter (Signed)
Spoke to patient and  Contacted daughter. Patient states he is hard of hearing and will need assistance with virtual visit .  Patient gave permission to contact daughter. RN  SPOKE TO [place] how the virtual visit will be conducted. She states her husband can assist patient tomorrow- they have a laptop- email address obtain.   RN  informed daughter - will call  Patient - will  Obtain vitals and medication and then Dr Ellyn Hack will contact patient through email address ( [email or web address]) daughter states understanding , RN contacted patient , he verbalized understanding.    TELEPHONE CALL NOTE  This patient has been deemed a candidate for follow-up tele-health visit to limit community exposure during the Covid-19 pandemic. I spoke with the patient via phone to discuss instructions.  The patient will receive a phone call 2-3 days prior to their E-Visit at which time consent will be verbally confirmed.   A Virtual Office Visit appointment type has been scheduled for 5/4 with [facility], with "VIDEO"/EMAIL ( USING DAUGHTER COMPUTER.  Raiford Simmonds, RN 07/13/2018 9:01 AM

## 2018-07-14 ENCOUNTER — Telehealth: Payer: Self-pay

## 2018-07-14 ENCOUNTER — Other Ambulatory Visit: Payer: Self-pay | Admitting: Cardiology

## 2018-07-14 ENCOUNTER — Telehealth (INDEPENDENT_AMBULATORY_CARE_PROVIDER_SITE_OTHER): Payer: Medicare Other | Admitting: Cardiology

## 2018-07-14 ENCOUNTER — Encounter: Payer: Self-pay | Admitting: Cardiology

## 2018-07-14 ENCOUNTER — Telehealth: Payer: Self-pay | Admitting: Cardiology

## 2018-07-14 VITALS — BP 150/69 | HR 63 | Ht 71.0 in | Wt 173.0 lb

## 2018-07-14 DIAGNOSIS — I951 Orthostatic hypotension: Secondary | ICD-10-CM

## 2018-07-14 DIAGNOSIS — I2510 Atherosclerotic heart disease of native coronary artery without angina pectoris: Secondary | ICD-10-CM

## 2018-07-14 DIAGNOSIS — E785 Hyperlipidemia, unspecified: Secondary | ICD-10-CM

## 2018-07-14 DIAGNOSIS — I214 Non-ST elevation (NSTEMI) myocardial infarction: Secondary | ICD-10-CM

## 2018-07-14 DIAGNOSIS — I208 Other forms of angina pectoris: Secondary | ICD-10-CM

## 2018-07-14 DIAGNOSIS — I10 Essential (primary) hypertension: Secondary | ICD-10-CM

## 2018-07-14 DIAGNOSIS — Z955 Presence of coronary angioplasty implant and graft: Secondary | ICD-10-CM

## 2018-07-14 MED ORDER — TICAGRELOR 60 MG PO TABS: 60.0000 mg | ORAL_TABLET | Freq: Two times a day (BID) | ORAL | 3 refills | Status: DC

## 2018-07-14 NOTE — Telephone Encounter (Signed)
Unable to reach patient I have called multiple times but no one is answering the phone and then the phone hangs up. Left message on [name] number informing that I was trying to reach the patient to discuss AVS instructions and for her or patient to contact the office.

## 2018-07-14 NOTE — Assessment & Plan Note (Signed)
Doing well with no unstable symptoms.  No angina or heart failure symptoms.  Had a Myoview test done last year that was nonischemic.  Pretty much stable.  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  Relatively stable EF.  Plan:   Continue maintenance dose of 60 mg twice daily Brilinta without aspirin.    Continue current dose of beta-blocker, Imdur and calcium channel blocker as antianginal regimen.  Continue statin at current dose.

## 2018-07-14 NOTE — Progress Notes (Signed)
Virtual Visit via Video Note   This visit type was conducted due to national recommendations for restrictions regarding the COVID-19 Pandemic (e.g. social distancing) in an effort to limit this patient's exposure and mitigate transmission in our community.  Due to his co-morbid illnesses, this patient is at least at moderate risk for complications without adequate follow up.  This format is felt to be most appropriate for this patient at this time.  All issues noted in this document were discussed and addressed.  A limited physical exam was performed with this format.  Please refer to the patient's chart for his consent to telehealth for [facility]   Patient has given verbal permission to conduct this visit via virtual appointment and to bill insurance 07/14/2018 11:21 AM     --Video assistance provided by the patient's granddaughter.  Initial attempts using computer to computer video conferencing was unsuccessful after 2 attempts, we switched to computer smartphone.  Evaluation Performed:  Follow-up visit  Date:  07/14/2018   ID:  JAGER KOSKA, DOB Apr 17, 1940, MRN [id number]  Patient Location: Home Provider Location: Home  PCP:  Denita Lung, MD  Cardiologist:  Glenetta Hew, MD  Electrophysiologist:  None   Chief Complaint:  ~annual f/u - CAD-CABG  History of Present Illness:    Xavier Giles is a 78 y.o. male with PMH notable for CAD-CABG ('98)-PCI (native & graft) - (see below) who presents via audio/video conferencing for a telehealth visit today.   2003: Abn ST -> PCI-SVG-OM (prox limb): 3.0 x 8 Zeta BMS- going into native OM2);; PTCA only native D1 through SVG; CTO SVG-RCA; patent LIMA  Nov 2013 - NSTEMI: SVG-RCA & SVG-Diag CTO.  Severe SVG => DES PCI (Xience)  April 2015: Unstable angina: Cardiac CATH: patent LIMA-LAD w/ progression of dLAD to ~80%.  PCI to SVG-CxOM2-OM3 99% prox lesion & PTCA of 95% ISR in sequential limb..   08/24/13: Myoview Stress Test post  PCI looked good!! Evidence of possible old MI / scar, but nothing to suggest ischemia. Not Gated b/c ectopy .  -- No real change in f/u Myoview 06/2017  Geremiah Fussell Arrey was last seen in July 2019 - he was doing well.  Remaining active with yard work/gardening & routine exercise (walking about a mile).  Still avoiding "overexertion" or walking upstairs -- b/c this usually will make him SOB & even potentially have angina.   Interval History:  Doing well - stable.  He continues to be active as usual.  As his baseline he does not get short of breath unless he overexerts or goes up stairs or up hills.  This has not changed.  He still walks at least a mile to a mile and a half a day.  He is otherwise also very active doing yard work and gardening.  Besides some mild exertional dyspnea, he is stable from cardiac standpoint.  Cardiovascular ROS: no chest pain or dyspnea on exertion positive for - DOE only with strenuous exertion negative for - edema, irregular heartbeat, orthopnea, palpitations, paroxysmal nocturnal dyspnea, rapid heart rate, shortness of breath or syncope/near syncope, TIA/amaurosis fugax. claudication  The patient does not have symptoms concerning for COVID-19 infection (fever, chills, cough, or new shortness of breath).  The patient is practicing social distancing.  ROS:  Please see the history of present illness.    Review of Systems  Constitutional: Negative for malaise/fatigue and weight loss.  HENT: Positive for hearing loss. Negative for nosebleeds.   Respiratory: Positive  for cough (rare - related to pollen).   Gastrointestinal: Negative for blood in stool, diarrhea, heartburn and melena.  Genitourinary: Negative for hematuria.  Musculoskeletal: Negative for back pain, falls and joint pain.  Neurological: Negative for dizziness, focal weakness, weakness and headaches.  Endo/Heme/Allergies: Negative for environmental allergies (just pollen ).  Psychiatric/Behavioral:  Negative.   All other systems reviewed and are negative.   Past Medical History:  Diagnosis Date  . Asthma   . 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 CAD, multiple vessel 1998   Referred for CABG  . Carotid bruit present 01/2012   a. asymptomatic; carotid doppler 01/2012 - R bulb/prox ICA mild-mod amt of fibrous plaque, L subclavian 70-99% diameter reduction, L bulb/ICA mild amt of fibrous plaque  . Chronic low back pain   . Dyslipidemia, goal LDL below 70   . Hypertension, essential   . Non-ST elevated myocardial infarction (non-STEMI) ([place]) 01/2012   SVG-diagonal occluded, 50% distal left main, 70-80% proximal LAD and 90% mid. Patent LIMA with 70% lesion post anastomosis. SVG to RCA occluded, SVG-OM2-3 90% sequential limb stenosis -- PCI SVG-OM 2 -OM 3: Xience Xpedition DES 2.75 MM x15 MM (3 MM)   . S/P CABG x 5 1998   LIMA-LAD, SVG-OM2-OM3; SVG-rPDA, SVG-D1  . S/P cardiac catheterization 06/28/2013   Cres Angina: a) 60% dLM; 70% Ostial LAD; 90% Cx - no OMs seen; 100% RCA;; SVG-D1 & SVG--dRCA occluded; Patent LIMA-LAD with dLAD ~70-80%;; SVG-OM2-OM3: 95% proximal, 50% mid, Diffuse ISR in BMS-OM2 & DES in Seq limb-OM3;; b) PCI:  prox SVG-OM-OM - Resolute DES 3.0 mm x 26 mm (3.3 mm); PTCA (Angiosculpt) to ISR in Seq limb - ~3.0 mm.; c)  Myoview 08/25/13 - small non-T-mural Inf scar, No Ischemia  . Stented coronary artery 2003; 01/2012; 05/2013   a) '03 BMS SVG-OM2-OM3 (initial limb into OM2);; b) 2013 - DES to Seq Lmg SVG-OM2-OM3 - Xience DES;; c) 05/2013 Prox SVG--OM2-OM3 Resolute DES 3.0 mm x 26 mm; Angiosculpt PTCA of ISR in Seq Limb (3.0 mm)   Past Surgical History:  Procedure Laterality Date  . CATARACT EXTRACTION W/ INTRAOCULAR LENS  IMPLANT, BILATERAL Bilateral   . CORONARY ANGIOPLASTY WITH STENT  PLACEMENT  2003   BMS 3.0 mm x 8 mm stent to SVG to OM & cuttin balloon to diagonal; 100% occluded vein graft to PDA, patent LIMA; 80% stenosis in SVG-diagnal; vein graft stenosis in OM  . CORONARY ARTERY BYPASS GRAFT  1998   SVG-diagonal, DVG-OM, DVG-PDA, LIMA-LAD  . ESOPHAGOGASTRODUODENOSCOPY N/A 06/29/2013   Procedure: ESOPHAGOGASTRODUODENOSCOPY (EGD);  Surgeon: Beryle Beams, MD;  Location[facility] ENDOSCOPY;  Service: Endoscopy;  Laterality: N/A;  . LEFT HEART CATHETERIZATION WITH CORONARY/GRAFT ANGIOGRAM N/A 01/13/2012   Procedure: LEFT HEART CATHETERIZATION WITH Beatrix Fetters;  Surgeon: Lorretta Harp, MD;  Location: [facility] CATH LAB;  Service: Cardiovascular: PCI & stenting of SVG-OM2 to OM3 segment with Xience Xpedition 2.16mmx15mm (3.77mm) DES  . LEFT HEART CATHETERIZATION WITH CORONARY/GRAFT ANGIOGRAM N/A 06/28/2013   Procedure: LEFT HEART CATHETERIZATION WITH Beatrix Fetters;  Surgeon: Troy Sine, MD;  Location: [facility] CATH LAB;  Service: Cardiovascular: Patent LIMA-LAD (progression of distal LAD ~80%, SVG-Diag, SVG-RCA 100%. PCI to SVG-CxOM2-OM3 99% prox lesion (Resolute DES 3.0 x 26 - 3.4 mm) & PTCA of  95% ISR in sequential limb (3.0 mm Angiosculpt)   . NM MYOVIEW LTD  07/2013   Test looked good!! Evidence of possible old MI / scar, but nothing to suggest ischemia. Not Gated b/c ectopy .  [name] NM MYOVIEW LTD  06/2017   Intermediate risk because of reduced EF but stable.  Stable fixed inferior defect consistent with either diaphragmatic attenuation or inferior infarct.  Septal hypokinesis/dyskinesis due to LBBB and prior CABG.  EF is 46%.  Stable compared to 2015 following PCI  . TONSILLECTOMY       Current Meds  Medication Sig  . amLODipine-benazepril (LOTREL) 10-20 MG capsule TAKE 1 CAPSULE BY MOUTH ONCE DAILY  . atenolol (TENORMIN) 50 MG tablet TAKE 1 TABLET BY MOUTH ONCE DAILY  . isosorbide mononitrate (IMDUR) 60 MG 24 hr tablet TAKE 1 TABLET BY MOUTH ONCE DAILY  .  nitroGLYCERIN (NITROSTAT) 0.4 MG SL tablet Place 1 tablet (0.4 mg total) under the tongue every 5 (five) minutes as needed (up to 3 doses).  . pantoprazole (PROTONIX) 40 MG tablet Take 1 tablet (40 mg total) by mouth daily.  . pravastatin (PRAVACHOL) 40 MG tablet TAKE 1 TABLET BY MOUTH ONCE DAILY  . ticagrelor (BRILINTA) 60 MG TABS tablet Take 1 tablet (60 mg total) by mouth 2 (two) times daily.  . [DISCONTINUED] ticagrelor (BRILINTA) 90 MG TABS tablet Take 1 tablet (90 mg total) by mouth 2 (two) times daily.     Allergies:   Keflex [cephalexin]; Zocor [simvastatin]; Lipitor [atorvastatin]; and Sulfa antibiotics   Social History   Tobacco Use  . Smoking status: Former Smoker    Packs/day: 2.00    Years: 30.00    Pack years: 60.00    Types: Cigarettes    Last attempt to quit: 03/02/1990    Years since quitting: 28.3  . Smokeless tobacco: Never Used  Substance Use Topics  . Alcohol use: Yes    Comment: 06/28/2013 "aien't drank in years; used to drink a little beer"  . Drug use: No     Family Hx: The patient's family history includes Cancer in his mother; Heart attack in his father.   Prior CV studies:   The following studies were reviewed today: . None since Myoview 06/2017  Labs/Other Tests and Data Reviewed:    EKG:  No ECG reviewed.  Recent Labs: No results found for requested labs within last 8760 hours.   Recent Lipid Panel Lab Results  Component Value Date   CHOL 122 06/24/2017   HDL 43 06/24/2017   LDLCALC 62 06/24/2017   TRIG 83 06/24/2017   CHOLHDL 2.8 06/24/2017    Wt Readings from Last 3 Encounters:  07/14/18 173 lb (78.5 kg)  09/17/17 171 lb 4 oz (77.7 kg)  07/14/17 176 lb (79.8 kg)     Objective:    Vital Signs:  BP (!) 150/69   Pulse 63   Ht 5\' 11"  (1.803 m)   Wt 173 lb (78.5 kg)   BMI 24.13 kg/m   VITAL SIGNS:  reviewed GEN:  Well nourished, well developed male in no acute distress. RESPIRATORY:  normal respiratory effort, symmetric  expansion CARDIOVASCULAR:  no peripheral edema NEURO:  alert and oriented x 3, no obvious focal deficit PSYCH:  normal affect   ASSESSMENT & PLAN:    Problem List Items Addressed This Visit    CAD, multiple vessel;  CABG in 1998; CTO SVG-RCA & SVG-Diag, multiple PCI SVG-OM, patent LIMA-LAD with dLAD 80% - Primary (Chronic)  Doing well with no unstable symptoms.  No angina or heart failure symptoms.  Had a Myoview test done last year that was nonischemic.  Pretty much stable.  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  Relatively stable EF.  Plan:   Continue maintenance dose of 60 mg twice daily Brilinta without aspirin.    Continue current dose of beta-blocker, Imdur and calcium channel blocker as antianginal regimen.  Continue statin at current dose.      Relevant Orders   Comprehensive Metabolic Panel (CMET)   Lipid panel   Dyslipidemia, goal LDL below 70 (Chronic)    Last lipids were checked in April of last year.  I suspect that he did not get his labs done this year because of the COVID-19 restrictions.  LDL was right around 60 on current dose of pravastatin.  Is due for labs to be checked this year.  We will ask him to come in and get his labs checked, and hopefully continue current dose of pravastatin. Tolerating well.      Relevant Orders   Comprehensive Metabolic Panel (CMET)   Lipid panel   Essential hypertension (Chronic)    Somewhat unusual for his blood pressure to be as high as it is today.  He says today is been the highest he seen in the last several months.  At this point I think continue to use the HCTZ on an as-needed basis is probably the best decision because of a history of orthostatic hypotension. Continue current meds with no change.      Relevant Orders   Comprehensive Metabolic Panel (CMET)   Lipid panel   History of: NSTEMI (non-ST elevated myocardial infarction) (Chronic)    Confirmed on Myoview and echocardiogram.  In the  past was called diaphragmatic attenuation but probably was prior infarct with reduced EF and wall motion normality.  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 Stable exertional dyspnea which may or may not be class I angina equivalent.  But well controlled.      Orthostatic hypotension    No recurrent symptoms.  We have backed off the HCTZ.  Again avoid dehydration.  Change to dosing intervals for atenolol and Lotrel.      Presence of drug coated stent in left circumflex coronary artery (Chronic)    Status post DES PCI to SVG-OM 2-3.  Now on Brilinta.  We will reduce to maintenance dose of 60 mg twice daily.  He has Hessie Knows out now where he would be okay to hold Brilinta 5-7 days prior to procedure/surgeries. Also would hold Brilinta for 3-5 days for bad bleeding or bruising.      Relevant Orders   Comprehensive Metabolic Panel (CMET)   Lipid panel   Stable angina (HCC) (Chronic)    If his dyspnea on exertion is considered to be a an anginal equivalent, then may be class I, but probably more just some mild ischemic microvascular diastolic dysfunction.  No significant anginal symptoms on combination of beta-blocker, Imdur and calcium channel blocker.      Relevant Orders   Comprehensive Metabolic Panel (CMET)   Lipid panel      COVID-19 Education: The signs and symptoms of COVID-19 were discussed with the patient and how to seek care for testing (follow up with PCP or arrange E-visit).   The importance of social distancing was discussed today.  Time:   Today, I have spent 15 minutes with the patient with telehealth technology  discussing the above problems.  15 min spent getting video link established & 10 min on charting.  Total time 35-40 min.  Medication Adjustments/Labs and Tests Ordered: Current medicines are reviewed at length with the patient today.  Concerns regarding medicines are outlined above.  Medication Instructions:   For next Refill of  Brilinta - change to 60 mg BID (maintenance dose) Otherwise no change  Tests Ordered: Orders Placed This Encounter  Procedures  . Comprehensive Metabolic Panel (CMET)  . Lipid panel   Fasting lipid panel, CMP (this month)  Medication Changes: Meds ordered this encounter  Medications  . ticagrelor (BRILINTA) 60 MG TABS tablet    Sig: Take 1 tablet (60 mg total) by mouth 2 (two) times daily.    Dispense:  60 tablet    Refill:  3    Disposition:  Follow up in 6-8 month(s)    Signed, Glenetta Hew, MD  07/14/2018 11:21 AM    [place]

## 2018-07-14 NOTE — Patient Instructions (Addendum)
Medication Instructions:  DECREASE Brilinta to 60mg  Take 1 tablet twice a day  If you need a refill on your cardiac medications before your next appointment, please call your pharmacy.   Lab work: Your physician recommends that you return for lab work in: CMET and LIPID (complete before end of month) If you have labs (blood work) drawn today and your tests are completely normal, you will receive your results only by: [name] MyChart Message (if you have MyChart) OR . A paper copy in the mail If you have any lab test that is abnormal or we need to change your treatment, we will call you to review the results.  Testing/Procedures: None   Follow-Up: At [facility], you and your health needs are our priority.  As part of our continuing mission to provide you with exceptional heart care, we have created designated Provider Care Teams.  These Care Teams include your primary Cardiologist (physician) and Advanced Practice Providers (APPs -  Physician Assistants and Nurse Practitioners) who all work together to provide you with the care you need, when you need it. You will need a follow up appointment in 6-8 months.  Please call our office 2 months in advance to schedule this appointment.  You may see Glenetta Hew, MD or one of the following Advanced Practice Providers on your designated Care Team:   [name], [name] . Jory Sims, DNP, ANP  Any Other Special Instructions Will Be Listed Below (If Applicable).

## 2018-07-14 NOTE — Assessment & Plan Note (Signed)
Status post DES PCI to SVG-OM 2-3.  Now on Brilinta.  We will reduce to maintenance dose of 60 mg twice daily.  He has Hessie Knows out now where he would be okay to hold Brilinta 5-7 days prior to procedure/surgeries. Also would hold Brilinta for 3-5 days for bad bleeding or bruising.

## 2018-07-14 NOTE — Telephone Encounter (Signed)
New Message    Xavier Giles is calling for the pts appt today to leave her email address for the virtual visit  [email or web address]

## 2018-07-14 NOTE — Assessment & Plan Note (Signed)
If his dyspnea on exertion is considered to be a an anginal equivalent, then may be class I, but probably more just some mild ischemic microvascular diastolic dysfunction.  No significant anginal symptoms on combination of beta-blocker, Imdur and calcium channel blocker.

## 2018-07-14 NOTE — Telephone Encounter (Signed)
Spoke with Patients daughter [name] she states her dad is hard of hearing and does not hear the phone ringing. She states she can go over to her dads house tomorrow about 9am and that would be a good time to call to give her and her dad the discharge instructions. Informed her that I would do my best to call her back at that time. She thanked me for my call.

## 2018-07-14 NOTE — Assessment & Plan Note (Signed)
Somewhat unusual for his blood pressure to be as high as it is today.  He says today is been the highest he seen in the last several months.  At this point I think continue to use the HCTZ on an as-needed basis is probably the best decision because of a history of orthostatic hypotension. Continue current meds with no change.

## 2018-07-14 NOTE — Telephone Encounter (Signed)
Virtual Visit Pre-Appointment Phone Call  "Mr Xavier Giles, I am calling you today to discuss your upcoming appointment. We are currently trying to limit exposure to the virus that causes COVID-19 by seeing patients at home rather than in the office."  1. "What is the BEST phone number to call the day of the visit?" - include this in appointment notes  2. "Do you have or have access to (through a family member/friend) a smartphone with video capability that we can use for your visit?" a. If yes - list this number in appt notes as "cell" (if different from BEST phone #) and list the appointment type as a VIDEO visit in appointment notes b. If no - list the appointment type as a PHONE visit in appointment notes  3. Confirm consent - "In the setting of the current Covid19 crisis, you are scheduled for a VIDEO visit with your provider on 07/14/2018 at [facility] .  Just as we do with many in-office visits, in order for you to participate in this visit, we must obtain consent.  If you'd like, I can send this to your mychart (if signed up) or email for you to review.  Otherwise, I can obtain your verbal consent now.  All virtual visits are billed to your insurance company just like a normal visit would be.  By agreeing to a virtual visit, we'd like you to understand that the technology does not allow for your provider to perform an examination, and thus may limit your provider's ability to fully assess your condition. If your provider identifies any concerns that need to be evaluated in person, we will make arrangements to do so.  Finally, though the technology is pretty good, we cannot assure that it will always work on either your or our end, and in the setting of a video visit, we may have to convert it to a phone-only visit.  In either situation, we cannot ensure that we have a secure connection.  Are you willing to proceed?" STAFF: Did the patient verbally acknowledge consent to telehealth visit? Document YES/NO  here: YES  4. Advise patient to be prepared - "Two hours prior to your appointment, go ahead and check your blood pressure, pulse, oxygen saturation, and your weight (if you have the equipment to check those) and write them all down. When your visit starts, your provider will ask you for this information. If you have an Apple Watch or Kardia device, please plan to have heart rate information ready on the day of your appointment. Please have a pen and paper handy nearby the day of the visit as well."  5. Give patient instructions for MyChart download to smartphone OR Doximity/Doxy.me as below if video visit (depending on what platform provider is using)  6. Inform patient they will receive a phone call 15 minutes prior to their appointment time (may be from unknown caller ID) so they should be prepared to answer    Xavier Giles has been deemed a candidate for a follow-up tele-health visit to limit community exposure during the Covid-19 pandemic. I spoke with the patient via phone to ensure availability of phone/video source, confirm preferred email & phone number, and discuss instructions and expectations.  I reminded Xavier Giles to be prepared with any vital sign and/or heart rhythm information that could potentially be obtained via home monitoring, at the time of his visit. I reminded Xavier Giles to expect a phone call prior to  his visit.  Xavier Giles, CMA 07/14/2018 8:15 AM   INSTRUCTIONS FOR DOWNLOADING THE MYCHART APP TO SMARTPHONE  - The patient must first make sure to have activated MyChart and know their login information - If Apple, go to [employer] and type in MyChart in the search bar and download the app. If Android, ask patient to go to [employer] and type in [place] in the search bar and download the app. The app is free but as with any other app downloads, their phone may require them to verify saved payment information or Apple/Android  password.  - The patient will need to then log into the app with their MyChart username and password, and select [facility] as their healthcare provider to link the account. When it is time for your visit, go to the MyChart app, find appointments, and click Begin Video Visit. Be sure to Select Allow for your device to access the Microphone and Camera for your visit. You will then be connected, and your provider will be with you shortly.  **If they have any issues connecting, or need assistance please contact MyChart service desk [phone])**  **If using a computer, in order to ensure the best quality for their visit they will need to use either of the following Internet Browsers: [employer], or Google Chrome**  IF USING DOXIMITY or DOXY.ME - The patient will receive a link just prior to their visit by text.     FULL LENGTH CONSENT FOR TELE-HEALTH VISIT   I hereby voluntarily request, consent and authorize [facility] and its employed or contracted physicians, physician assistants, nurse practitioners or other licensed health care professionals (the Practitioner), to provide me with telemedicine health care services (the "Services") as deemed necessary by the treating Practitioner. I acknowledge and consent to receive the Services by the Practitioner via telemedicine. I understand that the telemedicine visit will involve communicating with the Practitioner through live audiovisual communication technology and the disclosure of certain medical information by electronic transmission. I acknowledge that I have been given the opportunity to request an in-person assessment or other available alternative prior to the telemedicine visit and am voluntarily participating in the telemedicine visit.  I understand that I have the right to withhold or withdraw my consent to the use of telemedicine in the course of my care at any time, without affecting my right to future care or treatment,  and that the Practitioner or I may terminate the telemedicine visit at any time. I understand that I have the right to inspect all information obtained and/or recorded in the course of the telemedicine visit and may receive copies of available information for a reasonable fee.  I understand that some of the potential risks of receiving the Services via telemedicine include:  [name] Delay or interruption in medical evaluation due to technological equipment failure or disruption; . Information transmitted may not be sufficient (e.g. poor resolution of images) to allow for appropriate medical decision making by the Practitioner; and/or  . In rare instances, security protocols could fail, causing a breach of personal health information.  Furthermore, I acknowledge that it is my responsibility to provide information about my medical history, conditions and care that is complete and accurate to the best of my ability. I acknowledge that Practitioner's advice, recommendations, and/or decision may be based on factors not within their control, such as incomplete or inaccurate data provided by me or distortions of diagnostic images or specimens that may result from electronic transmissions.  I understand that the practice of medicine is not an exact science and that Practitioner makes no warranties or guarantees regarding treatment outcomes. I acknowledge that I will receive a copy of this consent concurrently upon execution via email to the email address I last provided but may also request a printed copy by calling the office of [facility].    I understand that my insurance will be billed for this visit.   I have read or had this consent read to me. . I understand the contents of this consent, which adequately explains the benefits and risks of the Services being provided via telemedicine.  . I have been provided ample opportunity to ask questions regarding this consent and the Services and have had my questions  answered to my satisfaction. . I give my informed consent for the services to be provided through the use of telemedicine in my medical care  By participating in this telemedicine visit I agree to the above.

## 2018-07-14 NOTE — Assessment & Plan Note (Signed)
Confirmed on Myoview and echocardiogram.  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 Stable exertional dyspnea which may or may not be class I angina equivalent.  But well controlled.

## 2018-07-14 NOTE — Assessment & Plan Note (Signed)
No recurrent symptoms.  We have backed off the HCTZ.  Again avoid dehydration.  Change to dosing intervals for atenolol and Lotrel.

## 2018-07-14 NOTE — Assessment & Plan Note (Signed)
Last lipids were checked in April of last year.  I suspect that he did not get his labs done this year because of the COVID-19 restrictions.  LDL was right around 60 on current dose of pravastatin.  Is due for labs to be checked this year.  We will ask him to come in and get his labs checked, and hopefully continue current dose of pravastatin. Tolerating well.

## 2018-07-15 NOTE — Telephone Encounter (Signed)
Reduced dose of Brilinta is simply moving to Maintenance dosing from full treatment dose.  Reduces bleeding risk.   Continue the 90 mg tabs until done - & until we get the new patient assistance paperwork done.    Glenetta Hew, MD

## 2018-07-15 NOTE — Telephone Encounter (Signed)
Spoke with Ivin Booty and she states it is ok tp speak with Patients daughter Audrea Muscat).   Called [name] and she stated she is the POA of her dad; informed her to bring a copy of paper work to office once things calm down so patient have have updated health information in his chart and advised her to have her dad complete a updated DPR form. She voiced understanding.  Also reviewed AVS instructions from her dad's office visit.  She wanted to know why Dr Ellyn Hack decreased patients Brilinta to 60 mg from the 90 mg tablet? And she wanted to know if we could send another rx to the patient assistance program because that's where pt gets this medication from. Informed daughter that I would reach out to Dr Ellyn Hack and Ivin Booty or myself would get back to her with an answer. She voiced understanding and thanked me for my call.

## 2018-07-15 NOTE — Telephone Encounter (Signed)
Left message for daughter - reduction of brillinta 60 mg twice   May continue 90 mg twice a day until new prescription is available Information will [place] to [place] [facility] [place] next [place] - due access to information)  Any question may call back

## 2018-07-22 ENCOUNTER — Telehealth: Payer: Self-pay | Admitting: *Deleted

## 2018-07-22 MED ORDER — TICAGRELOR 60 MG PO TABS: 60.0000 mg | ORAL_TABLET | Freq: Two times a day (BID) | ORAL | 3 refills | Status: DC

## 2018-07-22 NOTE — Telephone Encounter (Signed)
LEFT MESSAGE TO CALL BACK -  WANT TO INFORM  DAUGHTER- CONTACT [place] [facility] [place]  ABOUT CHANGE IN MEDICATION   ONLY NEED TO SEND NEW RX WITH PATIENT  ID # [id number] OR THE SWITCH TO HAPPEN   BRILINTA 60 MG TWICE A DAY AWAITING [place] SIGNATURE BEFOR RX CAN BE FAXED   FAXED -1 [phone]  ID# [id number] ON COVER PAGE AND RX

## 2018-07-26 DIAGNOSIS — I2510 Atherosclerotic heart disease of native coronary artery without angina pectoris: Secondary | ICD-10-CM | POA: Diagnosis not present

## 2018-07-26 DIAGNOSIS — E785 Hyperlipidemia, unspecified: Secondary | ICD-10-CM | POA: Diagnosis not present

## 2018-07-26 DIAGNOSIS — Z955 Presence of coronary angioplasty implant and graft: Secondary | ICD-10-CM | POA: Diagnosis not present

## 2018-07-26 DIAGNOSIS — I208 Other forms of angina pectoris: Secondary | ICD-10-CM | POA: Diagnosis not present

## 2018-07-26 DIAGNOSIS — I10 Essential (primary) hypertension: Secondary | ICD-10-CM | POA: Diagnosis not present

## 2018-07-26 LAB — COMPREHENSIVE METABOLIC PANEL
ALT: 15 IU/L (ref 0–44)
AST: 18 IU/L (ref 0–40)
Albumin/Globulin Ratio: 2.4 — ABNORMAL HIGH (ref 1.2–2.2)
Albumin: 4.6 g/dL (ref 3.7–4.7)
Alkaline Phosphatase: 73 IU/L (ref 39–117)
BUN/Creatinine Ratio: 15 (ref 10–24)
BUN: 15 mg/dL (ref 8–27)
Bilirubin Total: 0.6 mg/dL (ref 0.0–1.2)
CO2: 23 mmol/L (ref 20–29)
Calcium: 9.4 mg/dL (ref 8.6–10.2)
Chloride: 103 mmol/L (ref 96–106)
Creatinine, Ser: 1.03 mg/dL (ref 0.76–1.27)
GFR calc Af Amer: 79 mL/min/{1.73_m2} (ref >59)
GFR calc non Af Amer: 69 mL/min/{1.73_m2} (ref >59)
Globulin, Total: 1.9 g/dL (ref 1.5–4.5)
Glucose: 131 mg/dL — ABNORMAL HIGH (ref 65–99)
Potassium: 4.8 mmol/L (ref 3.5–5.2)
Sodium: 140 mmol/L (ref 134–144)
Total Protein: 6.5 g/dL (ref 6.0–8.5)

## 2018-07-26 LAB — LIPID PANEL
Chol/HDL Ratio: 2.5 ratio (ref 0.0–5.0)
Cholesterol, Total: 106 mg/dL (ref 100–199)
HDL: 43 mg/dL (ref >39)
LDL Calculated: 51 mg/dL (ref 0–99)
Triglycerides: 61 mg/dL (ref 0–149)
VLDL Cholesterol Cal: 12 mg/dL (ref 5–40)

## 2018-07-28 ENCOUNTER — Other Ambulatory Visit: Payer: Self-pay | Admitting: Cardiology

## 2018-07-29 ENCOUNTER — Telehealth: Payer: Self-pay | Admitting: Cardiology

## 2018-07-29 NOTE — Telephone Encounter (Signed)
Returned cal to pt daughter [name], relayed Dr Allison Quarry nurse's message from 5-22. Verbalizes understanding will call [employer] and make sure that they did receive the fax. She will CB if anything further is needed

## 2018-07-29 NOTE — Telephone Encounter (Signed)
New Message    Xavier Giles is returning a phone call   Please call back

## 2018-08-02 NOTE — Telephone Encounter (Signed)
Spoke daughter on 08/01/18 Informed her  Patient assistance form was faxed on 07/28/18 for brilinta 60 mg .  Give it few weeks if no medication call office. daughter states patient has month worth medication. She verbalized understanding

## 2018-08-03 ENCOUNTER — Telehealth: Payer: Self-pay | Admitting: *Deleted

## 2018-08-03 NOTE — Telephone Encounter (Signed)
The patient's daughter [name] has been notified of the result and verbalized understanding.  All questions (if any) were answered. Raiford Simmonds, RN 08/03/2018 1:48 PM

## 2018-08-03 NOTE — Telephone Encounter (Signed)
-----   Message from [name], MD sent at 08/01/2018  4:59 PM EDT ----- Overall labs look pretty good.  Blood glucose levels are still a bit high, but lower than last year. Normal kidney and liver function. Cholesterol panel looks great.  Better than last year.  LDL is stable at 51.  This is essentially our bull's-eye level.  No change.  Glenetta Hew, MD

## 2018-09-20 ENCOUNTER — Other Ambulatory Visit: Payer: Self-pay | Admitting: Cardiology

## 2018-11-18 ENCOUNTER — Other Ambulatory Visit: Payer: Self-pay

## 2018-11-18 ENCOUNTER — Encounter: Payer: Self-pay | Admitting: Cardiology

## 2018-11-18 ENCOUNTER — Ambulatory Visit (INDEPENDENT_AMBULATORY_CARE_PROVIDER_SITE_OTHER): Payer: Medicare Other | Admitting: Cardiology

## 2018-11-18 VITALS — BP 156/75 | HR 84 | Temp 97.2°F | Ht 71.0 in | Wt 168.4 lb

## 2018-11-18 DIAGNOSIS — Z955 Presence of coronary angioplasty implant and graft: Secondary | ICD-10-CM

## 2018-11-18 DIAGNOSIS — E785 Hyperlipidemia, unspecified: Secondary | ICD-10-CM | POA: Diagnosis not present

## 2018-11-18 DIAGNOSIS — I951 Orthostatic hypotension: Secondary | ICD-10-CM | POA: Diagnosis not present

## 2018-11-18 DIAGNOSIS — I214 Non-ST elevation (NSTEMI) myocardial infarction: Secondary | ICD-10-CM | POA: Diagnosis not present

## 2018-11-18 DIAGNOSIS — I208 Other forms of angina pectoris: Secondary | ICD-10-CM | POA: Diagnosis not present

## 2018-11-18 DIAGNOSIS — I25709 Atherosclerosis of coronary artery bypass graft(s), unspecified, with unspecified angina pectoris: Secondary | ICD-10-CM

## 2018-11-18 DIAGNOSIS — I10 Essential (primary) hypertension: Secondary | ICD-10-CM | POA: Diagnosis not present

## 2018-11-18 DIAGNOSIS — I2510 Atherosclerotic heart disease of native coronary artery without angina pectoris: Secondary | ICD-10-CM

## 2018-11-18 NOTE — Patient Instructions (Addendum)
Medication Instructions:  - START TAKING  ATENOLOL AND ISOSORBIDE ( IMDUR ) IN THE MORNING  - LOTREL  AT BEDTIME   If you need a refill on your cardiac medications before your next appointment, please call your pharmacy.   Lab work: NOT NEEDED  .  Testing/Procedures:  NOT NEEDED Follow-Up: At [facility], you and your health needs are our priority.  As part of our continuing mission to provide you with exceptional heart care, we have created designated Provider Care Teams.  These Care Teams include your primary Cardiologist (physician) and Advanced Practice Providers (APPs -  Physician Assistants and Nurse Practitioners) who all work together to provide you with the care you need, when you need it. . You will need a follow up appointment in 6   Months  MARCH 2021.  Please call our office 2 months in advance to schedule this appointment.  You may see Glenetta Hew, MD or one of the following Advanced Practice Providers on your designated Care Team:   . [name], [name] . Jory Sims, DNP, ANP  Any Other Special Instructions Will Be Listed Below (If Applicable).  DRINK 8 TO 10  - 8 oz of water daily   and if you are going to be outside -- Drink a extra 2 to 3 glasses of water

## 2018-11-18 NOTE — Progress Notes (Signed)
PCP: Denita Lung, MD  Clinic Note: Chief Complaint  Patient presents with   Follow-up    4 months after telemedicine visit   Coronary Artery Disease    Stable no angina   Dizziness    Orthostatic    HPI: Xavier Giles is a 78 y.o. male with a CAD-CABG (-'71) - PCI (native & graft - '2015) who presents today for 4 month f/u.    2003: Abn ST ->PCI-SVG-OM (prox limb): 3.0 x 8 Zeta BMS- going into native OM2);; PTCA only native D1 through SVG; CTO SVG-RCA;patent LIMA  Nov 2013 - NSTEMI: SVG-RCA &SVG-Diag CTO. Severe SVG =>DES PCI (Xience)  April 2015: Unstable angina: CardiacCATH:patent LIMA-LADw/progression ofdLADto ~80%. PCI to SVG-CxOM2-OM3 99% prox lesion &PTCA of 95% ISR in sequential limb..   08/24/13: Myoview Stress Testpost PCIlooked good!! Evidence of possible old MI / scar, but nothing to suggest ischemia. Not Gated b/c ectopy . -- No real change in f/u Myoview 06/2017  Xavier Giles was last seen on Jul 04, 2018 via telemedicine --> he was doing well at that time.  Still active.  Only short of breath if he overexerts or goes up hills.  Stable.  Labs ordered.  Reduced Brilinta to 60 mg maintenance dose.  Recent Hospitalizations:   none  Studies Personally Reviewed - (if available, images/films reviewed: From Epic Chart or Care Everywhere)  No new story  Interval History: Xavier Giles is returning today pretty much doing okay.  His major complaint is that he can get dizzy if he was up or down quickly.  Especially yesterday.  When he notes that he only gets dizzy when standing up quickly but much worse when it is hot.  He does not really do well in the heat.  He still has some labile blood pressures were yesterday his blood pressure was 130/70 whereas today it is 156/75.  He is worried if it gets much lower though he gets dizzy quite easily.  Again he only gets short of breath walking uphill or fast upstairs.  In addition to walking, he does gardening and  other yard work.  Otherwise, with his routine exercise he is doing pretty well.  Just got 90% of the days he walks at least an hour a day with his wife.  He is not having any chest pain or pressure with rest or exertion.  No exertional dyspnea.  No claudication  No PND, orthopnea and pretty well controlled swelling.  He only has mild right leg swelling only.  He really denies any rapid irregular heartbeats or palpitations.  No syncope/near syncope or TIA/amaurosis fugax.  He only has the dizziness with change in position but has not had any near syncope.  ROS: A comprehensive was performed. Review of Systems  Constitutional: Negative for malaise/fatigue and weight loss.  HENT: Negative for congestion and nosebleeds.   Respiratory: Negative for shortness of breath and wheezing.   Cardiovascular:       Per HPI  Gastrointestinal: Negative for blood in stool, constipation, heartburn and melena.  Genitourinary: Negative for hematuria.  Musculoskeletal: Positive for joint pain (Arthritis pains--hips and knees.  Limits walking more than dyspnea). Negative for falls.  Skin: Negative for itching and rash.  Neurological: Positive for dizziness (Per HPI). Negative for tingling, focal weakness and headaches.  Psychiatric/Behavioral: Negative.    I have reviewed and (if needed) personally updated the patient's problem list, medications, allergies, past medical and surgical history, social and family history.   Past  Medical History:  Diagnosis Date   Asthma    CAD (coronary artery disease) of bypass graft 2003   + Nuc ST: 100% SVG-rPDA; 80% D1 post SVG; 80% SVG-OM; patent LIMA-LAD;; PCI-SVG-OM (prox limb): 3.0 x 8 Zeta BMS- going into native OM2);; PTCA only native D1 through SVG   CAD, multiple vessel 1998   Referred for CABG   Carotid bruit present 01/2012   a. asymptomatic; carotid doppler 01/2012 - R bulb/prox ICA mild-mod amt of fibrous plaque, L subclavian 70-99% diameter reduction, L  bulb/ICA mild amt of fibrous plaque   Chronic low back pain    Dyslipidemia, goal LDL below 70    Hypertension, essential    Non-ST elevated myocardial infarction (non-STEMI) ([place]) 01/2012   SVG-diagonal occluded, 50% distal left main, 70-80% proximal LAD and 90% mid. Patent LIMA with 70% lesion post anastomosis. SVG to RCA occluded, SVG-OM2-3 90% sequential limb stenosis -- PCI SVG-OM 2 -OM 3: Xience Xpedition DES 2.75 MM x15 MM (3 MM)    S/P CABG x 5 1998   LIMA-LAD, SVG-OM2-OM3; SVG-rPDA, SVG-D1   S/P cardiac catheterization 06/28/2013   Cres Angina: a) 60% dLM; 70% Ostial LAD; 90% Cx - no OMs seen; 100% RCA;; SVG-D1 & SVG--dRCA occluded; Patent LIMA-LAD with dLAD ~70-80%;; SVG-OM2-OM3: 95% proximal, 50% mid, Diffuse ISR in BMS-OM2 & DES in Seq limb-OM3;; b) PCI:  prox SVG-OM-OM - Resolute DES 3.0 mm x 26 mm (3.3 mm); PTCA (Angiosculpt) to ISR in Seq limb - ~3.0 mm.; c)  Myoview 08/25/13 - small non-T-mural Inf scar, No Ischemia   Stented coronary artery 2003; 01/2012; 05/2013   a) '03 BMS SVG-OM2-OM3 (initial limb into OM2);; b) 2013 - DES to Seq Lmg SVG-OM2-OM3 - Xience DES;; c) 05/2013 Prox SVG--OM2-OM3 Resolute DES 3.0 mm x 26 mm; Angiosculpt PTCA of ISR in Seq Limb (3.0 mm)    Past Surgical History:  Procedure Laterality Date   CATARACT EXTRACTION W/ INTRAOCULAR LENS  IMPLANT, BILATERAL Bilateral    CORONARY ANGIOPLASTY WITH STENT PLACEMENT  2003   BMS 3.0 mm x 8 mm stent to SVG to OM & cuttin balloon to diagonal; 100% occluded vein graft to PDA, patent LIMA; 80% stenosis in SVG-diagnal; vein graft stenosis in OM   CORONARY ARTERY BYPASS GRAFT  1998   SVG-diagonal, DVG-OM, DVG-PDA, LIMA-LAD   ESOPHAGOGASTRODUODENOSCOPY N/A 06/29/2013   Procedure: ESOPHAGOGASTRODUODENOSCOPY (EGD);  Surgeon: Beryle Beams, MD;  Location: [facility] ENDOSCOPY;  Service: Endoscopy;  Laterality: N/A;   LEFT HEART CATHETERIZATION WITH CORONARY/GRAFT ANGIOGRAM N/A 01/13/2012   Procedure: LEFT HEART  CATHETERIZATION WITH Beatrix Fetters;  Surgeon: Lorretta Harp, MD;  Location: [facility] CATH LAB;  Service: Cardiovascular: PCI & stenting of SVG-OM2 to OM3 segment with Xience Xpedition 2.67mmx15mm (3.7mm) DES   LEFT HEART CATHETERIZATION WITH CORONARY/GRAFT ANGIOGRAM N/A 06/28/2013   Procedure: LEFT HEART CATHETERIZATION WITH Beatrix Fetters;  Surgeon: Troy Sine, MD;  Location: [facility] CATH LAB;  Service: Cardiovascular: Patent LIMA-LAD (progression of distal LAD ~80%, SVG-Diag, SVG-RCA 100%. PCI to SVG-CxOM2-OM3 99% prox lesion (Resolute DES 3.0 x 26 - 3.4 mm) & PTCA of  95% ISR in sequential limb (3.0 mm Angiosculpt)    NM MYOVIEW LTD  07/2013   Test looked good!! Evidence of possible old MI / scar, but nothing to suggest ischemia. Not Gated b/c ectopy .   NM MYOVIEW LTD  06/2017   Intermediate risk because of reduced EF but stable.  Stable fixed inferior defect consistent with either diaphragmatic attenuation or inferior  infarct.  Septal hypokinesis/dyskinesis due to LBBB and prior CABG.  EF is 46%.  Stable compared to 2015 following PCI   TONSILLECTOMY      Current Meds  Medication Sig   amLODipine-benazepril (LOTREL) 10-20 MG capsule Take 1 capsule by mouth once daily   atenolol (TENORMIN) 50 MG tablet Take 1 tablet by mouth once daily   isosorbide mononitrate (IMDUR) 60 MG 24 hr tablet Take 1 tablet by mouth once daily   nitroGLYCERIN (NITROSTAT) 0.4 MG SL tablet Place 1 tablet (0.4 mg total) under the tongue every 5 (five) minutes as needed (up to 3 doses).   pantoprazole (PROTONIX) 40 MG tablet Take 1 tablet (40 mg total) by mouth daily.   pravastatin (PRAVACHOL) 40 MG tablet Take 1 tablet by mouth once daily   ticagrelor (BRILINTA) 60 MG TABS tablet Take 1 tablet (60 mg total) by mouth 2 (two) times daily.    Allergies  Allergen Reactions   Keflex [Cephalexin] Other (See Comments)    Unknown    Zocor [Simvastatin] Other (See Comments)    Unknown     Lipitor [Atorvastatin] Other (See Comments)    Elevated blood sugar   Sulfa Antibiotics Nausea Only    Social History   Tobacco Use   Smoking status: Former Smoker    Packs/day: 2.00    Years: 30.00    Pack years: 60.00    Types: Cigarettes    Quit date: 03/02/1990    Years since quitting: 28.7   Smokeless tobacco: Never Used  Substance Use Topics   Alcohol use: Yes    Comment: 06/28/2013 "aien't drank in years; used to drink a little beer"   Drug use: No   Social History   Social History Narrative   Retired Married father of 2, grandfather tube.  Exercises routinely walking a roughly an hour a day.  Does not smoke does not drink.  He quit smoking many years ago.    family history includes Cancer in his mother; Heart attack in his father.  Wt Readings from Last 3 Encounters:  11/18/18 168 lb 6.4 oz (76.4 kg)  07/14/18 173 lb (78.5 kg)  09/17/17 171 lb 4 oz (77.7 kg)    PHYSICAL EXAM BP (!) 156/75    Pulse 84    Temp (!) 97.2 F (36.2 C)    Ht 5\' 11"  (1.803 m)    Wt 168 lb 6.4 oz (76.4 kg)    SpO2 95%    BMI 23.49 kg/m  Physical Exam  Constitutional: He is oriented to person, place, and time. He appears well-developed and well-nourished. No distress.  Relatively healthy-appearing.  Well-groomed  HENT:  Head: Normocephalic and atraumatic.  Quite hard of hearing  Neck: Normal range of motion. Neck supple. No hepatojugular reflux and no JVD present. Carotid bruit is not present.  Cardiovascular: Normal rate, regular rhythm, S1 normal and normal pulses.  No extrasystoles are present. PMI is not displaced. Exam reveals no gallop and no friction rub.  No murmur heard. Borderline split S2  Pulmonary/Chest: Effort normal and breath sounds normal. No respiratory distress. He has no wheezes. He has no rales.  Abdominal: Soft. Bowel sounds are normal. He exhibits no distension. There is no abdominal tenderness. There is no rebound.  No HSM  Musculoskeletal: Normal range of  motion.        General: Edema (Trivial right leg swelling, none on the left) present.  Neurological: He is alert and oriented to person, place, and time.  Psychiatric: He has a normal mood and affect. His behavior is normal. Judgment and thought content normal.  Vitals reviewed.    Adult ECG Report  Rate: 84 ;  Rhythm: normal sinus rhythm, premature ventricular contractions (PVC) and IVCD in a LBBB pattern noted, left axis deviation-LAFB.  Cannot rule out septal MI, age undetermined.;   Narrative Interpretation: This EKG is much more consistent with previous EKGs from 2017, May 2018 and prior to that.  However in November 2018 there was a right bundle branch block that is no longer present.   Other studies Reviewed: Additional studies/ records that were reviewed today include:  Recent Labs:   Lab Results  Component Value Date   CHOL 106 07/26/2018   HDL 43 07/26/2018   LDLCALC 51 07/26/2018   TRIG 61 07/26/2018   CHOLHDL 2.5 07/26/2018   Lab Results  Component Value Date   CREATININE 1.03 07/26/2018   BUN 15 07/26/2018   NA 140 07/26/2018   K 4.8 07/26/2018   CL 103 07/26/2018   CO2 23 07/26/2018   Lab Results  Component Value Date   WBC 8.7 06/29/2013   HGB 12.2 (L) 06/30/2013   HCT 35.4 (L) 06/30/2013   MCV 84.0 06/29/2013   PLT 163 06/29/2013    ASSESSMENT / PLAN: Problem List Items Addressed This Visit    CAD, multiple vessel;  CABG in 1998; CTO SVG-RCA & SVG-Diag, multiple PCI SVG-OM, patent LIMA-LAD with dLAD 80% (Chronic)    Had PCI in April 2015.  Negative Myoview 2019  Last visit we discontinued aspirin and reduced to maintenance dose Brilinta.  No bleeding issues.  FOR PREOP/PREPROCEDURE EVALUATION: Okay to hold Brilinta 5-7 days prior to surgery/procedure.  Restart 1 to 2 days postop       Coronary artery disease involving coronary bypass graft of native heart with angina pectoris (HCC) - Primary (Chronic)    Overall doing very well.  No unstable angina  symptoms.  Class I at the most on current medications.  Nonischemic Myoview in May 2019 just showing old infarct.  Preserved EF.  Plan: Continue current meds -> maintenance dose Brilinta without aspirin, statin, beta-blocker and calcium channel blocker-HCTZ combination along with Imdur  We will adjust dosing interval of medications to avoid orthostatic dizziness..       Relevant Orders   EKG 12-Lead (Completed)   Presence of drug coated stent in SVG-OM (Chronic)    Currently on maintenance dose Brilinta 60 mg daily.  Without aspirin.  FOR PREOP/PREPROCEDURE EVALUATION: Okay to hold Brilinta 5-7 days prior to surgery/procedure.  Restart 1 to 2 days postop       History of: NSTEMI (non-ST elevated myocardial infarction) (Chronic)   Relevant Orders   EKG 12-Lead (Completed)   Essential hypertension (Chronic)    Difficult to control here because he has orthostatic hypotension as well.  His pressure is high today but he did not take his medicine yet despite it being almost 4:00.  Plan is going to be to take atenolol and Imdur at night while taking the combination amlodipine-benazepril in the morning.  Distal allow for more consistent coverage over 24-hour.  But avoid the significant drops by taking all medications together in the morning.      Relevant Orders   EKG 12-Lead (Completed)   Dyslipidemia, goal LDL below 70 (Chronic)    Remains on stable dose of pravastatin with well-controlled lipids based on most recent check.  Okay to recheck by May of next year.  Orthostatic hypotension    Discussed importance of adequate hydration but also plan to adjust dosing interval of blood pressure medications.  Take Lotrel in the morning, and then atenolol plus Imdur in the evening.  Also avoid going out in the heat of the day.  If he is good to do that and go outside drinking extra 2-3 glasses of water.      Relevant Orders   EKG 12-Lead (Completed)      I spent a total of 20   minutes with the patient and chart review. >  50% of the time was spent in direct patient consultation.   Current medicines are reviewed at length with the patient today.  (+/- concerns) some positional dizziness & fatigue   Patient Instructions  Medication Instructions:  - START TAKING  ATENOLOL AND ISOSORBIDE ( IMDUR ) IN THE MORNING  - LOTREL  AT BEDTIME   If you need a refill on your cardiac medications before your next appointment, please call your pharmacy.   Lab work: NOT NEEDED  .  Testing/Procedures:  NOT NEEDED Follow-Up: At [facility], you and your health needs are our priority.  As part of our continuing mission to provide you with exceptional heart care, we have created designated Provider Care Teams.  These Care Teams include your primary Cardiologist (physician) and Advanced Practice Providers (APPs -  Physician Assistants and Nurse Practitioners) who all work together to provide you with the care you need, when you need it.  You will need a follow up appointment in 6   Months  MARCH 2021.  Please call our office 2 months in advance to schedule this appointment.  You may see Xavier Hew, MD or one of the following Advanced Practice Providers on your designated Care Team:    [name], [name]  Jory Sims, DNP, ANP  Any Other Special Instructions Will Be Listed Below (If Applicable).  DRINK 8 TO 10  - 8 oz of water daily   and if you are going to be outside -- Drink a extra 2 to 3 glasses of water     Studies Ordered:   Orders Placed This Encounter  Procedures   EKG 12-Lead      Xavier Giles, M.D., M.S. Interventional Cardiologist   Pager # [phone] Phone # [phone] [address] [place], [place] [address]   Thank you for choosing Heartcare at [facility]!!

## 2018-11-25 ENCOUNTER — Encounter: Payer: Self-pay | Admitting: Cardiology

## 2018-11-25 NOTE — Assessment & Plan Note (Signed)
Remains on stable dose of pravastatin with well-controlled lipids based on most recent check.  Okay to recheck by May of next year.

## 2018-11-25 NOTE — Assessment & Plan Note (Addendum)
Had PCI in April 2015.  Negative Myoview 2019  Last visit we discontinued aspirin and reduced to maintenance dose Brilinta.  No bleeding issues.  FOR PREOP/PREPROCEDURE EVALUATION: Okay to hold Brilinta 5-7 days prior to surgery/procedure.  Restart 1 to 2 days postop

## 2018-11-25 NOTE — Assessment & Plan Note (Signed)
Currently on maintenance dose Brilinta 60 mg daily.  Without aspirin.  FOR PREOP/PREPROCEDURE EVALUATION: Okay to hold Brilinta 5-7 days prior to surgery/procedure.  Restart 1 to 2 days postop

## 2018-11-25 NOTE — Assessment & Plan Note (Signed)
Discussed importance of adequate hydration but also plan to adjust dosing interval of blood pressure medications.  Take Lotrel in the morning, and then atenolol plus Imdur in the evening.  Also avoid going out in the heat of the day.  If he is good to do that and go outside drinking extra 2-3 glasses of water.

## 2018-11-25 NOTE — Assessment & Plan Note (Signed)
Overall doing very well.  No unstable angina symptoms.  Class I at the most on current medications.  Nonischemic Myoview in May 2019 just showing old infarct.  Preserved EF.  Plan: Continue current meds -> maintenance dose Brilinta without aspirin, statin, beta-blocker and calcium channel blocker-HCTZ combination along with Imdur  We will adjust dosing interval of medications to avoid orthostatic dizziness.[name]

## 2018-11-25 NOTE — Assessment & Plan Note (Signed)
Difficult to control here because he has orthostatic hypotension as well.  His pressure is high today but he did not take his medicine yet despite it being almost 4:00.  Plan is going to be to take atenolol and Imdur at night while taking the combination amlodipine-benazepril in the morning.  Distal allow for more consistent coverage over 24-hour.  But avoid the significant drops by taking all medications together in the morning.

## 2018-12-17 ENCOUNTER — Other Ambulatory Visit: Payer: Self-pay | Admitting: Cardiology

## 2019-01-01 ENCOUNTER — Other Ambulatory Visit: Payer: Self-pay | Admitting: Cardiology

## 2019-01-03 DIAGNOSIS — Z23 Encounter for immunization: Secondary | ICD-10-CM | POA: Diagnosis not present

## 2019-01-17 DIAGNOSIS — Z961 Presence of intraocular lens: Secondary | ICD-10-CM | POA: Diagnosis not present

## 2019-01-17 DIAGNOSIS — H04123 Dry eye syndrome of bilateral lacrimal glands: Secondary | ICD-10-CM | POA: Diagnosis not present

## 2019-01-17 DIAGNOSIS — H35033 Hypertensive retinopathy, bilateral: Secondary | ICD-10-CM | POA: Diagnosis not present

## 2019-01-17 DIAGNOSIS — H353134 Nonexudative age-related macular degeneration, bilateral, advanced atrophic with subfoveal involvement: Secondary | ICD-10-CM | POA: Diagnosis not present

## 2019-01-17 NOTE — Progress Notes (Signed)
[facility] Note  01/30/2019     CHIEF COMPLAINT Patient presents for Retina Evaluation   HISTORY OF PRESENT ILLNESS: Xavier Giles is a 78 y.o. male who presents to the clinic today for:   HPI    Retina Evaluation    In right eye.  This started 1 year ago.  Duration of 1 year.  Associated Symptoms Negative for Flashes, Distortion, Pain, Photophobia, Trauma, Jaw Claudication, Fever, Fatigue, Floaters, Blind Spot, Redness, Glare, Scalp Tenderness, Shoulder/Hip pain and Weight Loss.  Context:  distance vision, mid-range vision, near vision, reading, watching TV and driving.  Treatments tried include no treatments.  I, the attending physician,  performed the HPI with the patient and updated documentation appropriately.          Comments    Patient complains of blurry vision OD for at least a year, possibly longer. Referred by Dr. Parke Simmers to evaluate ARMD. Not on AREDS vitamins.        Last edited by Bernarda Caffey, MD on 01/30/2019  9:42 AM. (History)    pt states he saw Dr. Parke Simmers, for a routine eye exam to get glasses, he states [facility] year he [facility] to [facility] and got glasses but they did not help his vision, pt has had cataract sx with Dr. Dolores Lory (around 1997)  Referring physician: [place], [place], [place],  [place] [address]  HISTORICAL INFORMATION:   Selected notes from the [place] Referred by Dr. Parke Simmers for concern of ARMD OU   CURRENT MEDICATIONS: No current outpatient medications on file. (Ophthalmic Drugs)   No current facility-administered medications for this visit.  (Ophthalmic Drugs)   Current Outpatient Medications (Other)  Medication Sig  . amLODipine-benazepril (LOTREL) 10-20 MG capsule Take 1 capsule by mouth once daily  . atenolol (TENORMIN) 50 MG tablet Take 1 tablet by mouth once daily  . isosorbide mononitrate (IMDUR) 60 MG 24 hr tablet Take 1 tablet by mouth once daily  . nitroGLYCERIN  (NITROSTAT) 0.4 MG SL tablet Place 1 tablet (0.4 mg total) under the tongue every 5 (five) minutes as needed (up to 3 doses).  . pravastatin (PRAVACHOL) 40 MG tablet Take 1 tablet by mouth once daily  . ticagrelor (BRILINTA) 60 MG TABS tablet Take 1 tablet (60 mg total) by mouth 2 (two) times daily.   No current facility-administered medications for this visit.  (Other)      REVIEW OF SYSTEMS: ROS    Positive for: Cardiovascular, Eyes   Negative for: Constitutional, Gastrointestinal, Neurological, Skin, Genitourinary, Musculoskeletal, HENT, Endocrine, Respiratory, Psychiatric, Allergic/Imm, Heme/Lymph   Last edited by Roselee Nova D, COT on 01/30/2019  8:54 AM. (History)       ALLERGIES Allergies  Allergen Reactions  . Keflex [Cephalexin] Other (See Comments)    Unknown   . Zocor [Simvastatin] Other (See Comments)    Unknown   . Lipitor [Atorvastatin] Other (See Comments)    Elevated blood sugar  . Sulfa Antibiotics Nausea Only    PAST MEDICAL HISTORY Past Medical History:  Diagnosis Date  . Asthma   . 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 CAD, multiple vessel 1998   Referred for CABG  . Carotid bruit present 01/2012   a. asymptomatic; carotid doppler 01/2012 -  R bulb/prox ICA mild-mod amt of fibrous plaque, L subclavian 70-99% diameter reduction, L bulb/ICA mild amt of fibrous plaque  . Chronic low back pain   . Dyslipidemia, goal LDL below 70   . Hypertension, essential   . Non-ST elevated myocardial infarction (non-STEMI) ([place]) 01/2012   SVG-diagonal occluded, 50% distal left main, 70-80% proximal LAD and 90% mid. Patent LIMA with 70% lesion post anastomosis. SVG to RCA occluded, SVG-OM2-3 90% sequential limb stenosis -- PCI SVG-OM 2 -OM 3: Xience Xpedition DES 2.75 MM x15 MM (3 MM)    . S/P CABG x 5 1998   LIMA-LAD, SVG-OM2-OM3; SVG-rPDA, SVG-D1  . S/P cardiac catheterization 06/28/2013   Cres Angina: a) 60% dLM; 70% Ostial LAD; 90% Cx - no OMs seen; 100% RCA;; SVG-D1 & SVG--dRCA occluded; Patent LIMA-LAD with dLAD ~70-80%;; SVG-OM2-OM3: 95% proximal, 50% mid, Diffuse ISR in BMS-OM2 & DES in Seq limb-OM3;; b) PCI:  prox SVG-OM-OM - Resolute DES 3.0 mm x 26 mm (3.3 mm); PTCA (Angiosculpt) to ISR in Seq limb - ~3.0 mm.; c)  Myoview 08/25/13 - small non-T-mural Inf scar, No Ischemia  . Stented coronary artery 2003; 01/2012; 05/2013   a) '03 BMS SVG-OM2-OM3 (initial limb into OM2);; b) 2013 - DES to Seq Lmg SVG-OM2-OM3 - Xience DES;; c) 05/2013 Prox SVG--OM2-OM3 Resolute DES 3.0 mm x 26 mm; Angiosculpt PTCA of ISR in Seq Limb (3.0 mm)   Past Surgical History:  Procedure Laterality Date  . CATARACT EXTRACTION Bilateral    Dr. Dolores Lory  . CATARACT EXTRACTION W/ INTRAOCULAR LENS  IMPLANT, BILATERAL Bilateral   . CORONARY ANGIOPLASTY WITH STENT PLACEMENT  2003   BMS 3.0 mm x 8 mm stent to SVG to OM & cuttin balloon to diagonal; 100% occluded vein graft to PDA, patent LIMA; 80% stenosis in SVG-diagnal; vein graft stenosis in OM  . CORONARY ARTERY BYPASS GRAFT  1998   SVG-diagonal, DVG-OM, DVG-PDA, LIMA-LAD  . ESOPHAGOGASTRODUODENOSCOPY N/A 06/29/2013   Procedure: ESOPHAGOGASTRODUODENOSCOPY (EGD);  Surgeon: Beryle Beams, MD;  Location: [facility] ENDOSCOPY;  Service: Endoscopy;  Laterality: N/A;  . LEFT HEART CATHETERIZATION WITH CORONARY/GRAFT ANGIOGRAM N/A 01/13/2012   Procedure: LEFT HEART CATHETERIZATION WITH Beatrix Fetters;  Surgeon: Lorretta Harp, MD;  Location[facility] CATH LAB;  Service: Cardiovascular: PCI & stenting of SVG-OM2 to OM3 segment with Xience Xpedition 2.81mmx15mm (3.85mm) DES  . LEFT HEART CATHETERIZATION WITH CORONARY/GRAFT ANGIOGRAM N/A 06/28/2013   Procedure: LEFT HEART CATHETERIZATION WITH Beatrix Fetters;  Surgeon: Troy Sine, MD;  Location: [facility] CATH  LAB;  Service: Cardiovascular: Patent LIMA-LAD (progression of distal LAD ~80%, SVG-Diag, SVG-RCA 100%. PCI to SVG-CxOM2-OM3 99% prox lesion (Resolute DES 3.0 x 26 - 3.4 mm) & PTCA of  95% ISR in sequential limb (3.0 mm Angiosculpt)   . NM MYOVIEW LTD  07/2013   Test looked good!! Evidence of possible old MI / scar, but nothing to suggest ischemia. Not Gated b/c ectopy .  [name] NM MYOVIEW LTD  06/2017   Intermediate risk because of reduced EF but stable.  Stable fixed inferior defect consistent with either diaphragmatic attenuation or inferior infarct.  Septal hypokinesis/dyskinesis due to LBBB and prior CABG.  EF is 46%.  Stable compared to 2015 following PCI  . TONSILLECTOMY      FAMILY HISTORY Family History  Problem Relation Age of Onset  . Cancer Mother   . Heart attack Father     SOCIAL HISTORY Social History   Tobacco Use  . Smoking status: Former Smoker  Packs/day: 2.00    Years: 30.00    Pack years: 60.00    Types: Cigarettes    Quit date: 03/02/1990    Years since quitting: 28.9  . Smokeless tobacco: Never Used  Substance Use Topics  . Alcohol use: Yes    Comment: 06/28/2013 "aien't drank in years; used to drink a little beer"  . Drug use: No         OPHTHALMIC EXAM:  Base Eye Exam    Visual Acuity (Snellen - Linear)      Right Left   Dist cc CF at 3' 20/60   Dist ph cc NI 20/30   Correction: Glasses       Tonometry (Tonopen, 9:03 AM)      Right Left   Pressure 18 15       Pupils      Dark Light Shape React APD   Right 4 2 Round Brisk None   Left 4 2 Round Brisk None       Visual Fields (Counting fingers)      Left Right    Full    Restrictions  Central scotoma       Extraocular Movement      Right Left    Full, Ortho Full, Ortho       Neuro/Psych    Oriented x3: Yes   Mood/Affect: Normal       Dilation    Both eyes: 1.0% Mydriacyl, 2.5% Phenylephrine @ 9:03 AM        Slit Lamp and Fundus Exam    Slit Lamp Exam      Right Left    Lids/Lashes Dermatochalasis - upper lid Dermatochalasis - upper lid   Conjunctiva/Sclera White and quiet White and quiet   Cornea 1+ Punctate epithelial erosions, Arcus, 1+endopigment 1+ Punctate epithelial erosions, Arcus, 1+endopigment   Anterior Chamber deep, 0.5+pigment, mild vitreous prolapse temporal pupil Deep and quiet   Iris Round and dilated Round and dilated   Lens 3 piece PC IOL displaced inferiorly 3 piece PC IOL in good position   Vitreous Vitreous syneresis, Posterior vitreous detachment Vitreous syneresis, Posterior vitreous detachment       Fundus Exam      Right Left   Disc Pink and Sharp Pink and Sharp   C/D Ratio 0.3 0.3   Macula Flat, Blunted foveal reflex, Drusen, RPE mottling, clumping and atrophy -- significant central pigment clumping, focal punctate heme ST to macula,  Flat, Blunted foveal reflex, Drusen, RPE mottling, clumping and atrophy, +GA surrounding fovea   Vessels Mild Vascular attenuation Mild Vascular attenuation   Periphery Attached, reticular degeneration Attached, reticular degeneration        Refraction    Wearing Rx      Sphere Cylinder Axis Add   Right -0.75 +1.50 015 +2.50   Left -0.75 +1.00 173 +2.50       Manifest Refraction      Sphere Cylinder Axis Dist VA   Right -1.75 +1.50 070 CF   Left -1.00 +0.75 140 20/25          IMAGING AND PROCEDURES  Imaging and Procedures for @TODAY @  OCT, Retina - OU - Both Eyes       Right Eye Quality was good. Central Foveal Thickness: 290. Progression has no prior data. Findings include abnormal foveal contour, intraretinal fluid, no SRF, outer retinal atrophy, subretinal hyper-reflective material, pigment epithelial detachment, retinal drusen  (Scattered cystic changes overlying ORA/SRHM).   Left Eye Quality was  good. Central Foveal Thickness: 250. Progression has no prior data. Findings include normal foveal contour, intraretinal fluid, no SRF, outer retinal atrophy, retinal drusen , pigment  epithelial detachment (ORA surrounding central island of intact ellipsoid; normal foveal profile; scattered IRF/cystic changes).   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 CME - cystoid macular edema. PED - pigment epithelial detachment. IRF - intraretinal fluid. SRF - subretinal fluid. EZ - ellipsoid zone. ERM - epiretinal membrane. ORA - outer retinal atrophy. ORT - outer retinal tubulation. SRHM - subretinal hyper-reflective material                 ASSESSMENT/PLAN:    ICD-10-CM   1. 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 Retinal edema  H35.81 OCT, Retina - OU - Both Eyes  3. 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 Essential hypertension  I10   5. Hypertensive retinopathy of both eyes  H35.033   6. Pseudophakia of both eyes  Z96.1   7. Dislocation of intraocular lens, sequela  T85.22XS   8. Vitreous prolapse of right eye  H43.01     1-3. Age related macular degeneration, non-exudative, both eyes  - OD advanced stage with foveal involvement -- BCVA CF 3' from geographic atrophy  - OS advanced stage, sparing fovea -- BCVA 20/30  - The incidence, anatomy, and pathology of dry AMD, risk of progression, and the AREDS and AREDS 2 study including smoking risks discussed with patient.  - Recommend amsler grid monitoring  - f/u 6-8 weeks -- DFE/OCT -- will monitor for conversion to exudative disease  4,5. Hypertensive retinopathy OU  - discussed importance of tight BP control  - monitor  6-8. Pseudophakia OU w/ dislocated IOL and vitreous prolapse OD  - s/p CE/IOL OU (Dr. Dolores Lory)  - OS: 3-piece IOL in good posiiton  - OD:  3-piece IOL dislocated inferiorly with mild vitreous prolapse temporal pupil -- IOL relatively stable  - OD with vision limited by advanced ARMD -- no intervention recommended at this time  - recommend monitoring for now -- may need PPV / IOL exchange if IOL dislocates completely   Ophthalmic Meds Ordered this visit:  No orders of the defined types were placed in this encounter.      Return for f/u 6-8 weeks non-exu ARMD OU, DFE, OCT.  There are no Patient Instructions on file for this visit.   This document serves as a record of services personally performed by Gardiner Sleeper, MD, PhD. It was created on their behalf by Leeann Must, [place], a certified ophthalmic assistant. The creation of this record is the provider's dictation and/or activities during the visit.    Electronically signed by: Leeann Must, COA @TODAY @ 9:22 PM   This document serves as a record of services personally performed by Gardiner Sleeper, MD, PhD. It was created on their behalf by Ernest Mallick, OA, an ophthalmic assistant. The creation of this record is the provider's dictation and/or activities during the visit.    Electronically signed by: Ernest Mallick, OA 11.30.2020 9:22 PM    Explained the diagnoses, plan, and follow up with the patient and they expressed understanding.  Patient expressed understanding of the importance  of proper follow up care.   Gardiner Sleeper, M.D., Ph.D. Diseases & Surgery of the Retina and Vitreous [facility]  I have reviewed the above documentation for accuracy and completeness, and I agree with the above. Gardiner Sleeper, M.D., Ph.D. 01/30/19 9:22 PM   Abbreviations: M myopia (nearsighted); A astigmatism; H hyperopia (farsighted); P presbyopia; Mrx spectacle prescription;  CTL contact lenses; OD right eye; OS left eye; OU both eyes  XT exotropia; ET esotropia; PEK punctate epithelial keratitis; PEE punctate epithelial erosions; DES dry eye syndrome; MGD  meibomian gland dysfunction; ATs artificial tears; PFAT's preservative free artificial tears; [place] nuclear sclerotic cataract; PSC posterior subcapsular cataract; ERM epi-retinal membrane; PVD posterior vitreous detachment; RD retinal detachment; DM diabetes mellitus; DR diabetic retinopathy; NPDR non-proliferative diabetic retinopathy; PDR proliferative diabetic retinopathy; CSME clinically significant macular edema; DME diabetic macular edema; dbh dot blot hemorrhages; CWS cotton wool spot; POAG primary open angle glaucoma; C/D cup-to-disc ratio; HVF humphrey visual field; GVF goldmann visual field; OCT optical coherence tomography; IOP intraocular pressure; BRVO Branch retinal vein occlusion; CRVO central retinal vein occlusion; CRAO central retinal artery occlusion; BRAO branch retinal artery occlusion; RT retinal tear; SB scleral buckle; PPV pars plana vitrectomy; VH Vitreous hemorrhage; PRP panretinal laser photocoagulation; IVK intravitreal kenalog; VMT vitreomacular traction; MH Macular hole;  NVD neovascularization of the disc; NVE neovascularization elsewhere; AREDS age related eye disease study; ARMD age related macular degeneration; POAG primary open angle glaucoma; EBMD epithelial/anterior basement membrane dystrophy; ACIOL anterior chamber intraocular lens; IOL intraocular lens; PCIOL posterior chamber intraocular lens; Phaco/IOL phacoemulsification with intraocular lens placement; [place] photorefractive keratectomy; LASIK laser assisted in situ keratomileusis; HTN hypertension; DM diabetes mellitus; COPD chronic obstructive pulmonary disease

## 2019-01-23 ENCOUNTER — Other Ambulatory Visit: Payer: Self-pay | Admitting: Pharmacy Technician

## 2019-01-23 NOTE — Patient Outreach (Signed)
[place] [facility]) Care Management  01/23/2019  Xavier Giles 10-05-1940 [id number]                                       Medication Assistance Referral  Referral From: email from patients daughter, [name]  Medication/Company: [name] / [employer] Patient application portion: Emailed to [facility] @ [email or web address] Provider application portion: Faxed  to [name] Provider address/fax verified via: Office website   Follow up:  Will submit to [employer]ME once all documents have been received.  [name] [place] Certified Pharmacy Technician [place] Management Direct Dial:[phone]

## 2019-01-24 ENCOUNTER — Other Ambulatory Visit: Payer: Self-pay | Admitting: Pharmacist

## 2019-01-24 NOTE — Patient Outreach (Signed)
[facility]) Care Management  [place] 01/24/2019  GORDIE BAMBER [birth year] [id number]   [place] Referral for Medication assistance (renewal).   [facility] pharmacy assisted patient with patient assistance program applications in November 2019 for Brilinta through [employer]Me.  Per notes, patient does not have Medicare Part D plan therefore did not have to meet 3% TROOP and enrollment ends 1 year after approval instead of at the end of the calendar year.  Patient will be due in a few weeks for renewal.    PMHx includes but not limited to: CAD multiple vessel disease, s/p CABG '98, multiple PCIs /DES, HTN, HLD, asthma, former smoker (60 pack year history), also issues with orthostatic hypertension  Per review of cardiology notes, patient reduced to maintenance dose of Brilinta 60mg  BID this year.  [facility] pharmacy technician has already emailed application to patient's daughter for renewal and faxed provider portion to cardiologist.   Outreach:  Unsuccessful telephone call attempt to patient and daughter, [name], to review medications. HIPAA compliant voicemail left requesting a return call  Plan:  Will try calling patient and daughter again next week.  Will await return of documentation for Brilinta patient assistance program.   Ralene Bathe, PharmD, [place] [phone]

## 2019-01-30 ENCOUNTER — Ambulatory Visit (INDEPENDENT_AMBULATORY_CARE_PROVIDER_SITE_OTHER): Payer: Medicare Other | Admitting: Ophthalmology

## 2019-01-30 ENCOUNTER — Other Ambulatory Visit: Payer: Self-pay | Admitting: Pharmacist

## 2019-01-30 ENCOUNTER — Ambulatory Visit: Payer: Self-pay | Admitting: Pharmacist

## 2019-01-30 ENCOUNTER — Encounter (INDEPENDENT_AMBULATORY_CARE_PROVIDER_SITE_OTHER): Payer: Self-pay | Admitting: Ophthalmology

## 2019-01-30 DIAGNOSIS — I10 Essential (primary) hypertension: Secondary | ICD-10-CM

## 2019-01-30 DIAGNOSIS — T8522XS Displacement of intraocular lens, sequela: Secondary | ICD-10-CM

## 2019-01-30 DIAGNOSIS — H3581 Retinal edema: Secondary | ICD-10-CM

## 2019-01-30 DIAGNOSIS — Z961 Presence of intraocular lens: Secondary | ICD-10-CM

## 2019-01-30 DIAGNOSIS — H35033 Hypertensive retinopathy, bilateral: Secondary | ICD-10-CM

## 2019-01-30 DIAGNOSIS — H353123 Nonexudative age-related macular degeneration, left eye, advanced atrophic without subfoveal involvement: Secondary | ICD-10-CM | POA: Diagnosis not present

## 2019-01-30 DIAGNOSIS — H353114 Nonexudative age-related macular degeneration, right eye, advanced atrophic with subfoveal involvement: Secondary | ICD-10-CM

## 2019-01-30 DIAGNOSIS — H4301 Vitreous prolapse, right eye: Secondary | ICD-10-CM

## 2019-01-30 NOTE — Patient Outreach (Signed)
[facility]) Care Management  [place]   01/30/2019  Xavier Giles 1940/11/15 [id number]  [place] Referral for Medication assistance (renewal).   [facility] pharmacy assisted patient with patient assistance program applications in November 2019 for Brilinta through AZ&Me.  Per notes, patient does not have Medicare Part D plan therefore did not have to meet 3% TROOP and enrollment ends 1 year after approval instead of at the end of the calendar year.  Patient will be due in a few weeks for renewal.    PMHx includes but not limited to: CAD multiple vessel disease, s/p CABG '98, multiple PCIs /DES, HTN, HLD, asthma, former smoker (60 pack year history), also issues with orthostatic hypertension  Per review of cardiology notes, patient reduced to maintenance dose of Brilinta 60mg  BID this year.  [facility] pharmacy technician has already emailed application to patient's daughter for renewal and faxed provider portion to cardiologist.   Outreach:  Successful telephone call with patient's daughter.  HIPAA identifiers verified.   Subjective:  Daughter reports that patient fills up his own weekly pillbox fills and is very compliant with medications.  She denies any medication concerns other than renewal for Brilinta patient assistance program.  She reports she already returned application back to [place] last week.  She states that patient's medication list has Brilinta 60mg  BID but does not have bottles in front of her to visually confirm Brilinta dose.  She will ask her spouse who is with patient today at optometry visit to check the bottle.   Objective: The ASCVD Risk score Mikey Bussing DC Jr., et al., 2013) failed to calculate for the following reasons:   The 2013 ASCVD risk score is only valid for ages 25 to 42   The patient has a prior MI or stroke diagnosis  Lab Results  Component Value Date   CREATININE 1.03 07/26/2018   CREATININE 1.07 06/24/2017   CREATININE 1.06 07/07/2016    Lab Results  Component Value Date   HGBA1C 6.4 (H) 06/28/2013    Lipid Panel     Component Value Date/Time   CHOL 106 07/26/2018 0824   TRIG 61 07/26/2018 0824   HDL 43 07/26/2018 0824   CHOLHDL 2.5 07/26/2018 0824   CHOLHDL 3.1 07/07/2016 0903   VLDL 24 07/07/2016 0903   LDLCALC 51 07/26/2018 0824    BP Readings from Last 3 Encounters:  11/18/18 (!) 156/75  07/14/18 (!) 150/69  09/17/17 124/66    Allergies  Allergen Reactions  . Keflex [Cephalexin] Other (See Comments)    Unknown   . Zocor [Simvastatin] Other (See Comments)    Unknown   . Lipitor [Atorvastatin] Other (See Comments)    Elevated blood sugar  . Sulfa Antibiotics Nausea Only    Medications Reviewed Today    Reviewed by Bernarda Caffey, MD (Physician) on 01/30/19 at 0904  Med List Status: <None>  Medication Order Taking? Sig Documenting Provider Last Dose Status Informant  amLODipine-benazepril (LOTREL) 10-20 MG capsule [id number] Yes Take 1 capsule by mouth once daily [name], MD Taking Active   atenolol (TENORMIN) 50 MG tablet [id number] Yes Take 1 tablet by mouth once daily [name], MD Taking Active   isosorbide mononitrate (IMDUR) 60 MG 24 hr tablet [id number] Yes Take 1 tablet by mouth once daily [name], MD Taking Active   nitroGLYCERIN (NITROSTAT) 0.4 MG SL tablet [id number] Yes Place 1 tablet (0.4 mg total) under the tongue every 5 (five) minutes as  needed (up to 3 doses). [name], [name] Taking Active   pantoprazole (PROTONIX) 40 MG tablet [id number] Yes Take 1 tablet (40 mg total) by mouth daily. [name], [name] Taking Active   pravastatin (PRAVACHOL) 40 MG tablet [id number] Yes Take 1 tablet by mouth once daily [name], MD Taking Active   ticagrelor (BRILINTA) 60 MG TABS tablet [id number] Yes Take 1 tablet (60 mg total) by mouth 2 (two) times daily. [name], MD Taking Active   Med List Note Lavera Guise, [facility]  01/19/18 1004): g BID          Assessment: Drugs sorted by system:  Cardiovascular: amlodipine-benazepril, atenolol, isosorbide mononitrate, SL NTG, pravastatin, ticagrelor  Medication Review Findings:  . Pantoprazole: no longer taking, removed from medication list . Allergy listed to statins (atorvastatin, simvastatin) however tolerating pravastatin at this time . Daughter's spouse will verify Brilinta dose to confirm dose changed to 60mg  on medication sent from AZ&Me.    Plan: . [facility] pharmacy will submit Brilinta application to AZ&Me once all documents received from patient and provider and will update patient and daughter on any updates  Ralene Bathe, PharmD, [place] [phone]

## 2019-02-02 ENCOUNTER — Other Ambulatory Visit: Payer: Self-pay | Admitting: Cardiology

## 2019-02-02 ENCOUNTER — Other Ambulatory Visit: Payer: Self-pay | Admitting: Pharmacy Technician

## 2019-02-02 NOTE — Patient Outreach (Signed)
[place] [facility]) Care Management  02/02/2019  Xavier Giles [birth year] [id number]    Received provider portion(s) of patient assistance application(s) for Brilinta. Faxed completed application and required documents into AZ&ME.  Will follow up with company(ies) in 7-10 business days to check status of application(s).  [name] [place] Certified Pharmacy Technician [place] Management Direct Dial:[phone]

## 2019-02-20 ENCOUNTER — Other Ambulatory Visit: Payer: Self-pay | Admitting: Pharmacy Technician

## 2019-02-20 NOTE — Patient Outreach (Signed)
[place] [facility]) Care Management  02/20/2019  Xavier Giles Jun 26, 1940 [id number]    Follow up call placed to AZ[employer]ME regarding patient assistance application(s) for [name] confirms patient has been approved as of 12/18 until 02/16/2020!!!. Medication to arrive at patients home in 7-10 business days.  Follow up:  Will follow up with patients daughter in 10-14 business days to confirm medication has been received.  [name] [place] Certified Pharmacy Technician [place] Management Direct Dial:[phone]

## 2019-03-06 ENCOUNTER — Other Ambulatory Visit: Payer: Self-pay | Admitting: Pharmacist

## 2019-03-06 ENCOUNTER — Other Ambulatory Visit: Payer: Self-pay | Admitting: Pharmacy Technician

## 2019-03-06 NOTE — Patient Outreach (Signed)
[facility]) Care Management  [place]   03/06/2019  Xavier Giles 06/17/1940 [id number]  Patient has been approved for [place] patient assistance program(s).  Patient has been instructed on how to order refills and renewal process for 2021.  Plan: -Will close [facility] pharmacy case as goals of care have been met.  -I have provided my contact information if patient or family needs to reach out to me in the future.  -Thank you for allowing [facility] pharmacy to be involved in this patient's care.   Ralene Bathe, PharmD, [place] [phone]

## 2019-03-06 NOTE — Patient Outreach (Signed)
[facility]) Care Management  03/06/2019  DOCTOR COBAS 28-Apr-1940 [id number]    Incoming email from patients daughter, [name] regarding patient assistance medication delivery of Xavier Giles, [name] confirms patient received 90 supply of Brilinta from [employer]. Re-iterated to [name] that patient has been approved for program until 02/16/2020! Request they contact me regarding any issues with obtaining refills.  Follow up:  Will route note to [place] for case closure  [name]. [name] [place] Certified Pharmacy Technician [place] Management Direct Dial:[phone]

## 2019-03-17 NOTE — Progress Notes (Signed)
[facility] Note  03/27/2019     CHIEF COMPLAINT Patient presents for Retina Follow Up   HISTORY OF PRESENT ILLNESS: Xavier Giles is a 79 y.o. male who presents to the clinic today for:   HPI    Retina Follow Up    Patient presents with  Wet AMD.  In both eyes.  This started weeks ago.  Severity is moderate.  Duration of weeks.  Since onset it is stable.  I, the attending physician,  performed the HPI with the patient and updated documentation appropriately.          Comments    Pt states vision is not improving but is not getting any worse.  Patient denies eye pain or discomfort and denies any new or worsening floaters or fol OU.       Last edited by Bernarda Caffey, MD on 03/27/2019  9:39 AM. (History)    patient states vision not improving, but not any worse than before  Referring physician: Denita Lung, MD [place],  [place] [id number]  HISTORICAL INFORMATION:   Selected notes from the MEDICAL RECORD NUMBER Referred by Dr. Parke Simmers for concern of ARMD OU   CURRENT MEDICATIONS: No current outpatient medications on file. (Ophthalmic Drugs)   No current facility-administered medications for this visit. (Ophthalmic Drugs)   Current Outpatient Medications (Other)  Medication Sig  . amLODipine-benazepril (LOTREL) 10-20 MG capsule Take 1 capsule by mouth once daily  . atenolol (TENORMIN) 50 MG tablet Take 1 tablet by mouth once daily  . isosorbide mononitrate (IMDUR) 60 MG 24 hr tablet Take 1 tablet by mouth once daily  . nitroGLYCERIN (NITROSTAT) 0.4 MG SL tablet Place 1 tablet (0.4 mg total) under the tongue every 5 (five) minutes as needed (up to 3 doses).  . pravastatin (PRAVACHOL) 40 MG tablet Take 1 tablet by mouth once daily  . ticagrelor (BRILINTA) 60 MG TABS tablet Take 1 tablet (60 mg total) by mouth 2 (two) times daily.   No current facility-administered medications for this visit. (Other)      REVIEW OF  SYSTEMS: ROS    Positive for: Cardiovascular, Eyes   Negative for: Constitutional, Gastrointestinal, Neurological, Skin, Genitourinary, Musculoskeletal, HENT, Endocrine, Respiratory, Psychiatric, Allergic/Imm, Heme/Lymph   Last edited by Doneen Poisson on 03/27/2019  8:53 AM. (History)       ALLERGIES Allergies  Allergen Reactions  . Keflex [Cephalexin] Other (See Comments)    Unknown   . Zocor [Simvastatin] Other (See Comments)    Unknown   . Lipitor [Atorvastatin] Other (See Comments)    Elevated blood sugar  . Sulfa Antibiotics Nausea Only    PAST MEDICAL HISTORY Past Medical History:  Diagnosis Date  . Asthma   . 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 CAD, multiple vessel 1998   Referred for CABG  . Carotid bruit present 01/2012   a. asymptomatic; carotid doppler 01/2012 - R bulb/prox ICA mild-mod amt of fibrous plaque, L subclavian 70-99% diameter reduction, L bulb/ICA mild amt of fibrous plaque  . Chronic low back pain   . Dyslipidemia, goal LDL below 70   . Hypertension, essential   . Non-ST elevated myocardial infarction (non-STEMI) ([place]) 01/2012   SVG-diagonal occluded, 50% distal left main,  70-80% proximal LAD and 90% mid. Patent LIMA with 70% lesion post anastomosis. SVG to RCA occluded, SVG-OM2-3 90% sequential limb stenosis -- PCI SVG-OM 2 -OM 3: Xience Xpedition DES 2.75 MM x15 MM (3 MM)   . S/P CABG x 5 1998   LIMA-LAD, SVG-OM2-OM3; SVG-rPDA, SVG-D1  . S/P cardiac catheterization 06/28/2013   Cres Angina: a) 60% dLM; 70% Ostial LAD; 90% Cx - no OMs seen; 100% RCA;; SVG-D1 & SVG--dRCA occluded; Patent LIMA-LAD with dLAD ~70-80%;; SVG-OM2-OM3: 95% proximal, 50% mid, Diffuse ISR in BMS-OM2 & DES in Seq limb-OM3;; b) PCI:  prox SVG-OM-OM - Resolute DES 3.0 mm x 26 mm (3.3 mm); PTCA  (Angiosculpt) to ISR in Seq limb - ~3.0 mm.; c)  Myoview 08/25/13 - small non-T-mural Inf scar, No Ischemia  . Stented coronary artery 2003; 01/2012; 05/2013   a) '03 BMS SVG-OM2-OM3 (initial limb into OM2);; b) 2013 - DES to Seq Lmg SVG-OM2-OM3 - Xience DES;; c) 05/2013 Prox SVG--OM2-OM3 Resolute DES 3.0 mm x 26 mm; Angiosculpt PTCA of ISR in Seq Limb (3.0 mm)   Past Surgical History:  Procedure Laterality Date  . CATARACT EXTRACTION Bilateral    Dr. Dolores Lory  . CATARACT EXTRACTION W/ INTRAOCULAR LENS  IMPLANT, BILATERAL Bilateral   . CORONARY ANGIOPLASTY WITH STENT PLACEMENT  2003   BMS 3.0 mm x 8 mm stent to SVG to OM & cuttin balloon to diagonal; 100% occluded vein graft to PDA, patent LIMA; 80% stenosis in SVG-diagnal; vein graft stenosis in OM  . CORONARY ARTERY BYPASS GRAFT  1998   SVG-diagonal, DVG-OM, DVG-PDA, LIMA-LAD  . ESOPHAGOGASTRODUODENOSCOPY N/A 06/29/2013   Procedure: ESOPHAGOGASTRODUODENOSCOPY (EGD);  Surgeon: Beryle Beams, MD;  Location: [facility] ENDOSCOPY;  Service: Endoscopy;  Laterality: N/A;  . LEFT HEART CATHETERIZATION WITH CORONARY/GRAFT ANGIOGRAM N/A 01/13/2012   Procedure: LEFT HEART CATHETERIZATION WITH Beatrix Fetters;  Surgeon: Lorretta Harp, MD;  Location: [facility] CATH LAB;  Service: Cardiovascular: PCI & stenting of SVG-OM2 to OM3 segment with Xience Xpedition 2.14mmx15mm (3.52mm) DES  . LEFT HEART CATHETERIZATION WITH CORONARY/GRAFT ANGIOGRAM N/A 06/28/2013   Procedure: LEFT HEART CATHETERIZATION WITH Beatrix Fetters;  Surgeon: Troy Sine, MD;  [facility] LAB;  Service: Cardiovascular: Patent LIMA-LAD (progression of distal LAD ~80%, SVG-Diag, SVG-RCA 100%. PCI to SVG-CxOM2-OM3 99% prox lesion (Resolute DES 3.0 x 26 - 3.4 mm) & PTCA of  95% ISR in sequential limb (3.0 mm Angiosculpt)   . NM MYOVIEW LTD  07/2013   Test looked good!! Evidence of possible old MI / scar, but nothing to suggest ischemia. Not Gated b/c ectopy .  [name] NM MYOVIEW LTD  06/2017    Intermediate risk because of reduced EF but stable.  Stable fixed inferior defect consistent with either diaphragmatic attenuation or inferior infarct.  Septal hypokinesis/dyskinesis due to LBBB and prior CABG.  EF is 46%.  Stable compared to 2015 following PCI  . TONSILLECTOMY      FAMILY HISTORY Family History  Problem Relation Age of Onset  . Cancer Mother   . Heart attack Father     SOCIAL HISTORY Social History   Tobacco Use  . Smoking status: Former Smoker    Packs/day: 2.00    Years: 30.00    Pack years: 60.00    Types: Cigarettes    Quit date: 03/02/1990    Years since quitting: 29.0  . Smokeless tobacco: Never Used  Substance Use Topics  . Alcohol use: Yes    Comment: 06/28/2013 "aien't drank in years;  used to drink a little beer"  . Drug use: No         OPHTHALMIC EXAM:  Base Eye Exam    Visual Acuity (Snellen - Linear)      Right Left   Dist [place] CF @ 3' 20/50 -2   Dist ph [place] NI 20/40 -1       Tonometry (Tonopen, 8:57 AM)      Right Left   Pressure 19 17       Pupils      Dark Light Shape React APD   Right 4 3 Round Brisk 0   Left 4 3 Round Brisk 0       Visual Fields      Left Right    Full    Restrictions  Partial inner superior temporal, inferior temporal, superior nasal, inferior nasal deficiencies       Extraocular Movement      Right Left    Full Full       Neuro/Psych    Oriented x3: Yes   Mood/Affect: Normal       Dilation    Both eyes: 1.0% Mydriacyl, 2.5% Phenylephrine @ 8:57 AM        Slit Lamp and Fundus Exam    Slit Lamp Exam      Right Left   Lids/Lashes Dermatochalasis - upper lid Dermatochalasis - upper lid   Conjunctiva/Sclera White and quiet White and quiet   Cornea 1+ Punctate epithelial erosions, Arcus, 1+endopigment 1+ Punctate epithelial erosions, Arcus, 1+endopigment   Anterior Chamber deep, 0.5+pigment, mild pigmented vitreous prolapse temporal pupil Deep and quiet   Iris Round and dilated Round and  dilated   Lens 3 piece PC IOL displaced inferiorly 3 piece PC IOL in good position   Vitreous Vitreous syneresis, Posterior vitreous detachment Vitreous syneresis, Posterior vitreous detachment       Fundus Exam      Right Left   Disc Pink and Sharp Pink and Sharp, +SVP   C/D Ratio 0.3 0.3   Macula Flat, Blunted foveal reflex, Drusen, RPE mottling, clumping and atrophy -- significant central pigment clumping, focal IRH superior macula Flat, Blunted foveal reflex, Drusen, RPE mottling, clumping and atrophy, +GA surrounding fovea and extending to disc, no heme   Vessels Mild Vascular attenuation Mild Vascular attenuation   Periphery Attached, reticular degeneration Attached, reticular degeneration          IMAGING AND PROCEDURES  Imaging and Procedures for @TODAY @  OCT, Retina - OU - Both Eyes       Right Eye Quality was good. Central Foveal Thickness: 234. Progression has been stable. Findings include abnormal foveal contour, intraretinal fluid, outer retinal atrophy, subretinal hyper-reflective material, pigment epithelial detachment, retinal drusen , subretinal fluid (Scattered cystic changes overlying ORA/SRHM).   Left Eye Quality was good. Central Foveal Thickness: 253. Progression has been stable. Findings include normal foveal contour, intraretinal fluid, no SRF, outer retinal atrophy, retinal drusen , pigment epithelial detachment (Interval improvement in cystic changes).   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 CME - cystoid macular edema. PED - pigment epithelial detachment. IRF - intraretinal fluid. SRF - subretinal fluid. EZ - ellipsoid zone. ERM - epiretinal membrane. ORA - outer retinal atrophy. ORT - outer retinal tubulation. SRHM - subretinal  hyper-reflective material                 ASSESSMENT/PLAN:    ICD-10-CM   1. 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 Retinal edema  H35.81 OCT, Retina - OU - Both Eyes  3. 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 Essential hypertension  I10   5. Hypertensive retinopathy of both eyes  H35.033   6. Pseudophakia of both eyes  Z96.1   7. Vitreous prolapse of right eye  H43.01   8. Dislocation of intraocular lens, sequela  T85.22XS     1-3. Age related macular degeneration, non-exudative, both eyes  - OD advanced stage with foveal involvement -- BCVA CF 3' from geographic atrophy  - OS advanced stage, sparing fovea -- BCVA 20/40-1, slightly down from 20/30 at last visit  - The incidence, anatomy, and pathology of dry AMD, risk of progression, and the AREDS and AREDS 2 study including smoking risks discussed with patient.  - Recommend amsler grid monitoring  - f/u 3 months, sooner prn -- DFE/OCT -- will monitor for conversion to exudative disease  4,5. Hypertensive retinopathy OU  - discussed importance of tight BP control  - monitor  6-8. Pseudophakia OU w/ dislocated IOL and vitreous prolapse OD  - s/p CE/IOL OU (Dr. Dolores Lory)  - OS: 3-piece IOL in good posiiton  - OD: 3-piece IOL dislocated inferiorly with mild vitreous prolapse temporal pupil -- IOL relatively stable  - OD with vision limited by advanced ARMD -- no intervention recommended at this time  - recommend monitoring for now -- may need PPV / IOL exchange if IOL dislocates completely   Ophthalmic Meds Ordered this visit:  No orders of the defined types were placed in this encounter.      Return in about 3 months (around 06/25/2019) for DFE, OCT.  There are no Patient Instructions on file for this visit.   This document serves as a record of services  personally performed by Gardiner Sleeper, MD, PhD. It was created on their behalf by Roselee Nova, COMT. The creation of this record is the provider's dictation and/or activities during the visit.  Electronically signed by: Roselee Nova, COMT 03/27/19 1:36 PM  Gardiner Sleeper, M.D., Ph.D. Diseases & Surgery of the Retina and [place] 03/27/2019   I have reviewed the above documentation for accuracy and completeness, and I agree with the above. Gardiner Sleeper, M.D., Ph.D. 03/27/19 1:36 PM   Abbreviations: M myopia (nearsighted); A astigmatism; H hyperopia (farsighted); P presbyopia; Mrx spectacle prescription;  CTL contact lenses; OD right eye; OS left eye; OU both eyes  XT exotropia; ET esotropia; PEK punctate epithelial keratitis; PEE punctate epithelial erosions; DES dry eye syndrome; MGD meibomian gland dysfunction; ATs artificial tears; PFAT's preservative free artificial tears; [place] nuclear sclerotic cataract; PSC posterior subcapsular cataract; ERM epi-retinal membrane; PVD posterior vitreous detachment; RD retinal detachment; DM diabetes mellitus; DR diabetic retinopathy; NPDR non-proliferative diabetic retinopathy; PDR proliferative diabetic retinopathy; CSME clinically significant macular edema; DME diabetic macular edema; dbh dot blot hemorrhages; CWS cotton wool spot; POAG primary open angle glaucoma; C/D cup-to-disc ratio; HVF humphrey visual field; GVF goldmann visual field; OCT optical coherence tomography; IOP intraocular pressure; BRVO Branch retinal vein occlusion; CRVO central retinal vein occlusion; CRAO central retinal artery occlusion; BRAO branch retinal artery occlusion; RT retinal tear; SB  scleral buckle; PPV pars plana vitrectomy; VH Vitreous hemorrhage; PRP panretinal laser photocoagulation; IVK intravitreal kenalog; VMT vitreomacular traction; MH Macular hole;  NVD neovascularization of the disc; NVE neovascularization elsewhere; AREDS age  related eye disease study; ARMD age related macular degeneration; POAG primary open angle glaucoma; EBMD epithelial/anterior basement membrane dystrophy; ACIOL anterior chamber intraocular lens; IOL intraocular lens; PCIOL posterior chamber intraocular lens; Phaco/IOL phacoemulsification with intraocular lens placement; [place] photorefractive keratectomy; LASIK laser assisted in situ keratomileusis; HTN hypertension; DM diabetes mellitus; COPD chronic obstructive pulmonary disease

## 2019-03-27 ENCOUNTER — Encounter (INDEPENDENT_AMBULATORY_CARE_PROVIDER_SITE_OTHER): Payer: Self-pay | Admitting: Ophthalmology

## 2019-03-27 ENCOUNTER — Ambulatory Visit (INDEPENDENT_AMBULATORY_CARE_PROVIDER_SITE_OTHER): Payer: Medicare Other | Admitting: Ophthalmology

## 2019-03-27 DIAGNOSIS — H353114 Nonexudative age-related macular degeneration, right eye, advanced atrophic with subfoveal involvement: Secondary | ICD-10-CM | POA: Diagnosis not present

## 2019-03-27 DIAGNOSIS — H3581 Retinal edema: Secondary | ICD-10-CM

## 2019-03-27 DIAGNOSIS — Z961 Presence of intraocular lens: Secondary | ICD-10-CM

## 2019-03-27 DIAGNOSIS — H353123 Nonexudative age-related macular degeneration, left eye, advanced atrophic without subfoveal involvement: Secondary | ICD-10-CM | POA: Diagnosis not present

## 2019-03-27 DIAGNOSIS — I10 Essential (primary) hypertension: Secondary | ICD-10-CM | POA: Diagnosis not present

## 2019-03-27 DIAGNOSIS — H35033 Hypertensive retinopathy, bilateral: Secondary | ICD-10-CM

## 2019-03-27 DIAGNOSIS — H4301 Vitreous prolapse, right eye: Secondary | ICD-10-CM

## 2019-03-27 DIAGNOSIS — T8522XS Displacement of intraocular lens, sequela: Secondary | ICD-10-CM

## 2019-03-28 ENCOUNTER — Other Ambulatory Visit: Payer: Self-pay | Admitting: Cardiology

## 2019-04-13 ENCOUNTER — Other Ambulatory Visit: Payer: Self-pay | Admitting: Cardiology

## 2019-04-30 ENCOUNTER — Ambulatory Visit: Payer: Medicare Other

## 2019-05-04 ENCOUNTER — Ambulatory Visit: Payer: Medicare Other | Attending: Internal Medicine

## 2019-05-04 DIAGNOSIS — Z23 Encounter for immunization: Secondary | ICD-10-CM | POA: Insufficient documentation

## 2019-05-04 NOTE — Progress Notes (Signed)
   Covid-19 Vaccination Clinic  Name:  Xavier Giles    MRN: [id number] DOB: 04-29-1940  05/04/2019  Mr. Xavier Giles was observed post Covid-19 immunization for 15 minutes without incident. He was provided with Vaccine Information Sheet and instruction to access the V-Safe system.   Mr. Xavier Giles was instructed to call 911 with any severe reactions post vaccine: [name] Difficulty breathing  . Swelling of face and throat  . A fast heartbeat  . A bad rash all over body  . Dizziness and weakness   Immunizations Administered    Name Date Dose VIS Date Route   Pfizer COVID-19 Vaccine 05/04/2019  9:02 AM 0.3 mL 02/10/2019 Intramuscular   Manufacturer: [place]   Lot: [id number]   [place]: [id number]

## 2019-05-18 ENCOUNTER — Ambulatory Visit (INDEPENDENT_AMBULATORY_CARE_PROVIDER_SITE_OTHER): Payer: Medicare Other | Admitting: Cardiology

## 2019-05-18 ENCOUNTER — Encounter: Payer: Self-pay | Admitting: Cardiology

## 2019-05-18 ENCOUNTER — Other Ambulatory Visit: Payer: Self-pay

## 2019-05-18 VITALS — BP 150/73 | HR 68 | Temp 96.8°F | Ht 71.0 in | Wt 170.0 lb

## 2019-05-18 DIAGNOSIS — I25709 Atherosclerosis of coronary artery bypass graft(s), unspecified, with unspecified angina pectoris: Secondary | ICD-10-CM

## 2019-05-18 DIAGNOSIS — I951 Orthostatic hypotension: Secondary | ICD-10-CM

## 2019-05-18 DIAGNOSIS — I2510 Atherosclerotic heart disease of native coronary artery without angina pectoris: Secondary | ICD-10-CM

## 2019-05-18 DIAGNOSIS — E785 Hyperlipidemia, unspecified: Secondary | ICD-10-CM | POA: Diagnosis not present

## 2019-05-18 DIAGNOSIS — R7301 Impaired fasting glucose: Secondary | ICD-10-CM

## 2019-05-18 DIAGNOSIS — I10 Essential (primary) hypertension: Secondary | ICD-10-CM

## 2019-05-18 DIAGNOSIS — Z951 Presence of aortocoronary bypass graft: Secondary | ICD-10-CM | POA: Diagnosis not present

## 2019-05-18 DIAGNOSIS — Z955 Presence of coronary angioplasty implant and graft: Secondary | ICD-10-CM

## 2019-05-18 NOTE — Patient Instructions (Addendum)
Medication Instructions:  No changes  *If you need a refill on your cardiac medications before your next appointment, please call your pharmacy*   Lab Work:  CMP LIPID CBC  -fasting   Testing/Procedures: Not needed   Follow-Up: At [facility], you and your health needs are our priority.  As part of our continuing mission to provide you with exceptional heart care, we have created designated Provider Care Teams.  These Care Teams include your primary Cardiologist (physician) and Advanced Practice Providers (APPs -  Physician Assistants and Nurse Practitioners) who all work together to provide you with the care you need, when you need it.  We recommend signing up for the patient portal called "MyChart".  Sign up information is provided on this After Visit Summary.  MyChart is used to connect with patients for Virtual Visits (Telemedicine).  Patients are able to view lab/test results, encounter notes, upcoming appointments, etc.  Non-urgent messages can be sent to your provider as well.   To learn more about what you can do with MyChart, go to [email or web address].    Your next appointment:   6  month(s)-Sept 2021  The format for your next appointment:   In Person  Provider:   Glenetta Hew, MD   Other Instructions

## 2019-05-18 NOTE — Progress Notes (Signed)
Primary Care Provider: Denita Lung, MD Cardiologist: Glenetta Hew, MD Electrophysiologist: None  Clinic Note: Chief Complaint  Patient presents with  . Follow-up    31-month  . Coronary Artery Disease    Complex history.  No further angina.  . Leg Swelling    Left greater than right    HPI:    Xavier Giles is a 79 y.o. male with a PMH below who presents today for ~6 month f/u. .   2003: Abn ST ->PCI-SVG-OM (prox limb): 3.0 x 8 Zeta BMS- going into native OM2);; PTCA only native D1 through SVG; CTO SVG-RCA;patent LIMA  Nov 2013 - NSTEMI: SVG-RCA &SVG-Diag CTO. Severe SVG =>DES PCI (Xience)  April 2015: Unstable angina: CardiacCATH:patent LIMA-LADw/progression ofdLADto ~80%. PCI to SVG-CxOM2-OM3 99% prox lesion &PTCA of 95% ISR in sequential limb..   08/24/13: Myoview Stress Testpost PCIlooked good!! Evidence of possible old MI / scar, but nothing to suggest ischemia. Not Gated b/c ectopy . -- No real change in f/u Myoview 06/2017  Xavier Giles was last seen on November 18, 2018.  He was doing fairly well.  He did have some episodes of orthostati 6 c dizziness.  Does not really tolerate heat very well.  A little bit of dyspnea with walking uphill or upstairs.  Was making a concerted effort to walk most days with his wife.  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  This limits him more than dyspnea.  Recent Hospitalizations: None   Reviewed  CV studies:    The following studies were reviewed today: (if available, images/films reviewed: From Epic Chart or Care Everywhere) . None:   Interval History:   Xavier Giles returns here today for follow-up stating that he is doing fairly well.  He still has some positional dizziness, but better since we backed off on his meds.  He had not taken his blood pressure medication yet today and indicates his blood pressure is not usually this high.  About doing is noted is it is ankles little  swollen during the day, but definitely go down at night when he puts his feet up.  It does not bother him.  He denies any other heart failure symptoms of PND, orthopnea or significant exertional dyspnea. Mostly because of his arthritis pains he does not really do much in the way of any strenuous activities.  He does he will get short of breath,.  He has not had any chest pain with rest or exertion.  With the weather has been cold he did not do as much walking as usual, but is now hoping to get back into it as weather is improving.  CV Review of Symptoms (Summary): positive for - edema and Shortness of breath with more strenuous exertion-but more limited by arthritis pains negative for - chest pain, irregular heartbeat, orthopnea, palpitations, paroxysmal nocturnal dyspnea, rapid heart rate, shortness of breath or Syncope/near syncope, TIA/amaurosis fugax, claudication  The patient does not have symptoms concerning for COVID-19 infection (fever, chills, cough, or new shortness of breath).  The patient is practicing social distancing & Masking.  + Covid shot #1 - 2nd pending 3/25.    REVIEWED OF SYSTEMS   Review of Systems  Constitutional: Negative for malaise/fatigue (Not a lot of energy, but not fatigue).  HENT: Negative for congestion and nosebleeds.   Respiratory: Negative for shortness of breath.   Cardiovascular: Positive for leg swelling. Negative for claudication.  Gastrointestinal: Negative for blood in  stool and melena.  Genitourinary: Negative for hematuria.  Musculoskeletal: Positive for joint pain (Hips and knee pain, minutes activity.).  Neurological: Positive for dizziness (Less prominent orthostatic dizziness.). Negative for speech change, focal weakness and headaches.  Psychiatric/Behavioral: Negative for depression and memory loss (Not that he acknowledges, but may be some mild based on history taking.). The patient does not have insomnia.     I have reviewed and (if  needed) personally updated the patient's problem list, medications, allergies, past medical and surgical history, social and family history.   PAST MEDICAL HISTORY   Past Medical History:  Diagnosis Date  . Asthma   . 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 CAD, multiple vessel 1998   Referred for CABG  . Carotid bruit present 01/2012   a. asymptomatic; carotid doppler 01/2012 - R bulb/prox ICA mild-mod amt of fibrous plaque, L subclavian 70-99% diameter reduction, L bulb/ICA mild amt of fibrous plaque  . Chronic low back pain   . Dyslipidemia, goal LDL below 70   . Hypertension, essential   . Non-ST elevated myocardial infarction (non-STEMI) ([place]) 01/2012   SVG-diagonal occluded, 50% distal left main, 70-80% proximal LAD and 90% mid. Patent LIMA with 70% lesion post anastomosis. SVG to RCA occluded, SVG-OM2-3 90% sequential limb stenosis -- PCI SVG-OM 2 -OM 3: Xience Xpedition DES 2.75 MM x15 MM (3 MM)   . S/P CABG x 5 1998   LIMA-LAD, SVG-OM2-OM3; SVG-rPDA, SVG-D1  . S/P cardiac catheterization 06/28/2013   Cres Angina: a) 60% dLM; 70% Ostial LAD; 90% Cx - no OMs seen; 100% RCA;; SVG-D1 & SVG--dRCA occluded; Patent LIMA-LAD with dLAD ~70-80%;; SVG-OM2-OM3: 95% proximal, 50% mid, Diffuse ISR in BMS-OM2 & DES in Seq limb-OM3;; b) PCI:  prox SVG-OM-OM - Resolute DES 3.0 mm x 26 mm (3.3 mm); PTCA (Angiosculpt) to ISR in Seq limb - ~3.0 mm.; c)  Myoview 08/25/13 - small non-T-mural Inf scar, No Ischemia  . Stented coronary artery 2003; 01/2012; 05/2013   a) '03 BMS SVG-OM2-OM3 (initial limb into OM2);; b) 2013 - DES to Seq Lmg SVG-OM2-OM3 - Xience DES;; c) 05/2013 Prox SVG--OM2-OM3 Resolute DES 3.0 mm x 26 mm; Angiosculpt PTCA of ISR in Seq Limb (3.0 mm)    PAST SURGICAL HISTORY   Past Surgical History:    Procedure Laterality Date  . CATARACT EXTRACTION Bilateral    Dr. Dolores Lory  . CATARACT EXTRACTION W/ INTRAOCULAR LENS  IMPLANT, BILATERAL Bilateral   . CORONARY ANGIOPLASTY WITH STENT PLACEMENT  2003   BMS 3.0 mm x 8 mm stent to SVG to OM & cuttin balloon to diagonal; 100% occluded vein graft to PDA, patent LIMA; 80% stenosis in SVG-diagnal; vein graft stenosis in OM  . CORONARY ARTERY BYPASS GRAFT  1998   SVG-diagonal, DVG-OM, DVG-PDA, LIMA-LAD  . ESOPHAGOGASTRODUODENOSCOPY N/A 06/29/2013   Procedure: ESOPHAGOGASTRODUODENOSCOPY (EGD);  Surgeon: Beryle Beams, MD;  Location: [facility] ENDOSCOPY;  Service: Endoscopy;  Laterality: N/A;  . LEFT HEART CATHETERIZATION WITH CORONARY/GRAFT ANGIOGRAM N/A 01/13/2012   Procedure: LEFT HEART CATHETERIZATION WITH Beatrix Fetters;  Surgeon: Lorretta Harp, MD;  Location: [facility] CATH LAB;  Service: Cardiovascular: PCI & stenting of SVG-OM2 to OM3 segment with Xience Xpedition 2.46mmx15mm (3.65mm) DES  . LEFT HEART CATHETERIZATION WITH CORONARY/GRAFT ANGIOGRAM N/A 06/28/2013   Procedure:  LEFT HEART CATHETERIZATION WITH Beatrix Fetters;  Surgeon: Troy Sine, MD;  Location[facility] LAB;  Service: Cardiovascular: Patent LIMA-LAD (progression of distal LAD ~80%, SVG-Diag, SVG-RCA 100%. PCI to SVG-CxOM2-OM3 99% prox lesion (Resolute DES 3.0 x 26 - 3.4 mm) & PTCA of  95% ISR in sequential limb (3.0 mm Angiosculpt)   . NM MYOVIEW LTD  07/2013   Test looked good!! Evidence of possible old MI / scar, but nothing to suggest ischemia. Not Gated b/c ectopy .  [name] NM MYOVIEW LTD  06/2017   Intermediate risk because of reduced EF but stable.  Stable fixed inferior defect consistent with either diaphragmatic attenuation or inferior infarct.  Septal hypokinesis/dyskinesis due to LBBB and prior CABG.  EF is 46%.  Stable compared to 2015 following PCI  . TONSILLECTOMY      MEDICATIONS/ALLERGIES   Current Meds  Medication Sig  . amLODipine-benazepril (LOTREL) 10-20 MG  capsule Take 1 capsule by mouth once daily  . atenolol (TENORMIN) 50 MG tablet Take 1 tablet by mouth once daily  . isosorbide mononitrate (IMDUR) 60 MG 24 hr tablet Take 1 tablet by mouth once daily  . nitroGLYCERIN (NITROSTAT) 0.4 MG SL tablet Place 1 tablet (0.4 mg total) under the tongue every 5 (five) minutes as needed (up to 3 doses).  . pravastatin (PRAVACHOL) 40 MG tablet Take 1 tablet by mouth once daily  . ticagrelor (BRILINTA) 60 MG TABS tablet Take 1 tablet (60 mg total) by mouth 2 (two) times daily.    Allergies  Allergen Reactions  . Keflex [Cephalexin] Other (See Comments)    Unknown   . Zocor [Simvastatin] Other (See Comments)    Unknown   . Lipitor [Atorvastatin] Other (See Comments)    Elevated blood sugar  . Sulfa Antibiotics Nausea Only    SOCIAL HISTORY/FAMILY HISTORY   Reviewed in Epic:  Pertinent findings: Step 1 Covid vaccine done - 2nd pending next week.   OBJCTIVE -PE, EKG, labs   Wt Readings from Last 3 Encounters:  05/18/19 170 lb (77.1 kg)  11/18/18 168 lb 6.4 oz (76.4 kg)  07/14/18 173 lb (78.5 kg)    Physical Exam: BP (!) 150/73   Pulse 68   Temp (!) 96.8 F (36 C)   Ht 5\' 11"  (1.803 m)   Wt 170 lb (77.1 kg)   SpO2 99%   BMI 23.71 kg/m  Physical Exam  Constitutional: He is oriented to person, place, and time. He appears well-developed and well-nourished. No distress.  Relatively healthy-appearing.  Well-groomed.  HENT:  Head: Normocephalic and atraumatic.  Very hard of hearing  Neck: No JVD present.  Cardiovascular: Normal rate, regular rhythm and S1 normal.  Occasional extrasystoles are present. PMI is not displaced. Exam reveals decreased pulses (Mildly diminished pedal pulses). Exam reveals no gallop and no friction rub.  No murmur heard. Borderline split S2.  Pulmonary/Chest: Effort normal and breath sounds normal. No respiratory distress. He has no wheezes. He has no rales.  Abdominal: Soft. Bowel sounds are normal. He exhibits  no distension. There is no abdominal tenderness. There is no rebound.  No HSM  Musculoskeletal:        General: Edema (1-2+ left, 1+ right LE/ankle) present.     Cervical back: Normal range of motion and neck supple.  Neurological: He is alert and oriented to person, place, and time.  Psychiatric: He has a normal mood and affect. His behavior is normal. Judgment and thought content normal.  Vitals reviewed.  Adult ECG Report  Rate: 68;  Rhythm: normal sinus rhythm, premature ventricular contractions (PVC) and (Polymorphic PVCs), nonspecific IVCD.  Cannot rule out septal MI, age-indeterminate.;   Narrative Interpretation: Stable EKG.  Recent Labs:  Lab Results  Component Value Date   CHOL 126 05/23/2019   HDL 45 05/23/2019   LDLCALC 65 05/23/2019   TRIG 83 05/23/2019   CHOLHDL 2.8 05/23/2019   Lab Results  Component Value Date   CREATININE 1.17 05/23/2019   BUN 14 05/23/2019   NA 142 05/23/2019   K 4.9 05/23/2019   CL 103 05/23/2019   CO2 22 05/23/2019   Lab Results  Component Value Date   TSH 2.900 06/28/2013    ASSESSMENT/PLAN    Problem List Items Addressed This Visit    CAD, multiple vessel;  CABG in 1998; CTO SVG-RCA & SVG-Diag, multiple PCI SVG-OM, patent LIMA-LAD with dLAD 80% - Primary (Chronic)    Significant multivessel disease (both native and graft) with vein graft PCI.  Thankfully, no real anginal symptoms.  Relatively stable overall. Plan: Continue atenolol, amlodipine plus Imdur for antianginal benefit. Is on a combination with an ACE inhibitor along with the amlodipine. On maintenance dose Brilinta-no bleeding On modest dose low potency statin, tolerating it well.      Relevant Orders   Lipid panel (Completed)   Comprehensive metabolic panel (Completed)   CBC (Completed)   Coronary artery disease involving coronary bypass graft of native heart with angina pectoris (HCC) (Chronic)    Mild stable angina, but he does not really exert himself enough  to have much in the way of angina.  At most class I symptoms, but well controlled on current dose of Imdur, atenolol and amlodipine.  We removed the diuretic with dizziness.  He has had some mild swelling now but I would prefer to not treat with standing dose of diuretic.  May be consider Lasix in the future. Otherwise would recommend feet elevation.      S/P CABG x 3 (Chronic)    Most recent Myoview was in 2019.  Probably can wait another couple years before do another screening test.      Relevant Orders   Lipid panel (Completed)   Comprehensive metabolic panel (Completed)   CBC (Completed)   Presence of drug coated stent in SVG-OM (Chronic)    He is about 5 and half years out from his last PCI.  Being maintained currently on maintenance dose of Brilinta.  This is mostly now for secondary prevention.   Okay to hold Brilinta 5 to 7 days preop for any surgeries or procedures.      Relevant Orders   Lipid panel (Completed)   Comprehensive metabolic panel (Completed)   CBC (Completed)   Essential hypertension (Chronic)    He had issues with orthostatic hypotension despite having somewhat labile pressures and and some hypertension.  He stopped the HCTZ and had an adjustment in the timing of his medicines.  This has helped some, but he still has a little dizziness.  Instructed on importance of taking his time getting up.  Would still avoid any antihypertensive diuretic COSMIC but may consider as needed Lasix for edema.      Relevant Orders   Lipid panel (Completed)   Comprehensive metabolic panel (Completed)   CBC (Completed)   Dyslipidemia, goal LDL below 70 (Chronic)    Remains on pravastatin.  Was due for labs to be checked.  Will order for this upcoming week. -> Current note updated to  include most recent labs.  LDL 65.  Cholesterol 126.  Pretty much at goal on current dose of pravastatin.  No change.  We will check prior to annual follow-up next year.      Relevant Orders     Lipid panel (Completed)   Comprehensive metabolic panel (Completed)   CBC (Completed)   Orthostatic hypotension    Avoiding dehydration.  Stopped HCTZ. Adjusted medication timing.  Overall has that this is improved.  He now has some swelling in his legs, but I would simply recommend support socks, foot elevation and and try to avoid diuretic.  If necessary may need to use as needed Lasix which is less likely to cause hypotension.       Other Visit Diagnoses    Impaired fasting glucose        Rales's   COVID-19 Education: The signs and symptoms of COVID-19 were discussed with the patient and how to seek care for testing (follow up with PCP or arrange E-visit).   The importance of social distancing was discussed today.  I spent a total of 33minutes with the patient. >  50% of the time was spent in direct patient consultation.  Additional time spent with chart review  / charting (studies, outside notes, etc): 8 Total Time: 31 min   Current medicines are reviewed at length with the patient today.  (+/- concerns) n/a   Patient Instructions / Medication Changes & Studies & Tests Ordered   Patient Instructions  Medication Instructions:  No changes  *If you need a refill on your cardiac medications before your next appointment, please call your pharmacy*   Lab Work:  CMP LIPID CBC  -fasting   Testing/Procedures: Not needed   Follow-Up: At [facility], you and your health needs are our priority.  As part of our continuing mission to provide you with exceptional heart care, we have created designated Provider Care Teams.  These Care Teams include your primary Cardiologist (physician) and Advanced Practice Providers (APPs -  Physician Assistants and Nurse Practitioners) who all work together to provide you with the care you need, when you need it.  We recommend signing up for the patient portal called "MyChart".  Sign up information is provided on this After Visit Summary.   MyChart is used to connect with patients for Virtual Visits (Telemedicine).  Patients are able to view lab/test results, encounter notes, upcoming appointments, etc.  Non-urgent messages can be sent to your provider as well.   To learn more about what you can do with MyChart, go to [email or web address].    Your next appointment:   6  month(s)-Sept 2021  The format for your next appointment:   In Person  Provider:   Glenetta Hew, MD   Other Instructions     Studies Ordered:   Orders Placed This Encounter  Procedures  . Lipid panel  . Comprehensive metabolic panel  . CBC     Glenetta Hew, M.D., M.S. Interventional Cardiologist   Pager # [phone] Phone # [phone] [address] [place], [place] [address]   Thank you for choosing Heartcare at [facility]!!

## 2019-05-23 ENCOUNTER — Other Ambulatory Visit (INDEPENDENT_AMBULATORY_CARE_PROVIDER_SITE_OTHER): Payer: Medicare Other

## 2019-05-23 ENCOUNTER — Encounter: Payer: Self-pay | Admitting: Cardiology

## 2019-05-23 DIAGNOSIS — I10 Essential (primary) hypertension: Secondary | ICD-10-CM

## 2019-05-23 LAB — COMPREHENSIVE METABOLIC PANEL
ALT: 13 IU/L (ref 0–44)
AST: 19 IU/L (ref 0–40)
Albumin/Globulin Ratio: 2.3 — ABNORMAL HIGH (ref 1.2–2.2)
Albumin: 4.8 g/dL — ABNORMAL HIGH (ref 3.7–4.7)
Alkaline Phosphatase: 83 IU/L (ref 39–117)
BUN/Creatinine Ratio: 12 (ref 10–24)
BUN: 14 mg/dL (ref 8–27)
Bilirubin Total: 0.5 mg/dL (ref 0.0–1.2)
CO2: 22 mmol/L (ref 20–29)
Calcium: 9.6 mg/dL (ref 8.6–10.2)
Chloride: 103 mmol/L (ref 96–106)
Creatinine, Ser: 1.17 mg/dL (ref 0.76–1.27)
GFR calc Af Amer: 68 mL/min/{1.73_m2} (ref >59)
GFR calc non Af Amer: 59 mL/min/{1.73_m2} — ABNORMAL LOW (ref >59)
Globulin, Total: 2.1 g/dL (ref 1.5–4.5)
Glucose: 141 mg/dL — ABNORMAL HIGH (ref 65–99)
Potassium: 4.9 mmol/L (ref 3.5–5.2)
Sodium: 142 mmol/L (ref 134–144)
Total Protein: 6.9 g/dL (ref 6.0–8.5)

## 2019-05-23 LAB — LIPID PANEL
Chol/HDL Ratio: 2.8 ratio (ref 0.0–5.0)
Cholesterol, Total: 126 mg/dL (ref 100–199)
HDL: 45 mg/dL (ref >39)
LDL Chol Calc (NIH): 65 mg/dL (ref 0–99)
Triglycerides: 83 mg/dL (ref 0–149)
VLDL Cholesterol Cal: 16 mg/dL (ref 5–40)

## 2019-05-23 LAB — CBC
Hematocrit: 45.4 % (ref 37.5–51.0)
Hemoglobin: 15.4 g/dL (ref 13.0–17.7)
MCH: 28.5 pg (ref 26.6–33.0)
MCHC: 33.9 g/dL (ref 31.5–35.7)
MCV: 84 fL (ref 79–97)
Platelets: 208 10*3/uL (ref 150–450)
RBC: 5.41 x10E6/uL (ref 4.14–5.80)
RDW: 13.4 % (ref 11.6–15.4)
WBC: 6.4 10*3/uL (ref 3.4–10.8)

## 2019-05-23 NOTE — Assessment & Plan Note (Signed)
He is about 5 and half years out from his last PCI.  Being maintained currently on maintenance dose of Brilinta.  This is mostly now for secondary prevention.   Okay to hold Brilinta 5 to 7 days preop for any surgeries or procedures.

## 2019-05-23 NOTE — Assessment & Plan Note (Signed)
Avoiding dehydration.  Stopped HCTZ. Adjusted medication timing.  Overall has that this is improved.  He now has some swelling in his legs, but I would simply recommend support socks, foot elevation and and try to avoid diuretic.  If necessary may need to use as needed Lasix which is less likely to cause hypotension.

## 2019-05-23 NOTE — Assessment & Plan Note (Signed)
Significant multivessel disease (both native and graft) with vein graft PCI.  Thankfully, no real anginal symptoms.  Relatively stable overall. Plan: Continue atenolol, amlodipine plus Imdur for antianginal benefit. Is on a combination with an ACE inhibitor along with the amlodipine. On maintenance dose Brilinta-no bleeding On modest dose low potency statin, tolerating it well.

## 2019-05-23 NOTE — Assessment & Plan Note (Signed)
He had issues with orthostatic hypotension despite having somewhat labile pressures and and some hypertension.  He stopped the HCTZ and had an adjustment in the timing of his medicines.  This has helped some, but he still has a little dizziness.  Instructed on importance of taking his time getting up.  Would still avoid any antihypertensive diuretic COSMIC but may consider as needed Lasix for edema.

## 2019-05-23 NOTE — Assessment & Plan Note (Signed)
Most recent Myoview was in 2019.  Probably can wait another couple years before do another screening test.

## 2019-05-23 NOTE — Assessment & Plan Note (Signed)
Mild stable angina, but he does not really exert himself enough to have much in the way of angina.  At most class I symptoms, but well controlled on current dose of Imdur, atenolol and amlodipine.  We removed the diuretic with dizziness.  He has had some mild swelling now but I would prefer to not treat with standing dose of diuretic.  May be consider Lasix in the future. Otherwise would recommend feet elevation.

## 2019-05-23 NOTE — Assessment & Plan Note (Signed)
Remains on pravastatin.  Was due for labs to be checked.  Will order for this upcoming week. -> Current note updated to include most recent labs.  LDL 65.  Cholesterol 126.  Pretty much at goal on current dose of pravastatin.  No change.  We will check prior to annual follow-up next year.

## 2019-05-25 ENCOUNTER — Ambulatory Visit: Payer: Medicare Other | Attending: Internal Medicine

## 2019-05-25 DIAGNOSIS — Z23 Encounter for immunization: Secondary | ICD-10-CM

## 2019-05-25 NOTE — Progress Notes (Signed)
   Covid-19 Vaccination Clinic  Name:  THERAN COOLING    MRN: [id number] DOB: 1940-03-21  05/25/2019  Mr. Burkemper was observed post Covid-19 immunization for 15 minutes without incident. He was provided with Vaccine Information Sheet and instruction to access the V-Safe system.   Mr. Tellman was instructed to call 911 with any severe reactions post vaccine: [name] Difficulty breathing  . Swelling of face and throat  . A fast heartbeat  . A bad rash all over body  . Dizziness and weakness   Immunizations Administered    Name Date Dose VIS Date Route   Pfizer COVID-19 Vaccine 05/25/2019  8:51 AM 0.3 mL 02/10/2019 Intramuscular   Manufacturer: [place]   Lot: [id number]   [place]: [id number]

## 2019-06-22 NOTE — Progress Notes (Signed)
[facility] Note  06/26/2019     CHIEF COMPLAINT Patient presents for Retina Follow Up   HISTORY OF PRESENT ILLNESS: Xavier Giles is a 79 y.o. male who presents to the clinic today for:   HPI    Retina Follow Up    Patient presents with  Dry AMD.  In both eyes.  Severity is severe.  Duration of 3 months.  Since onset it is stable.  I, the attending physician,  performed the HPI with the patient and updated documentation appropriately.          Comments    Patient states vision the same OU.        Last edited by Bernarda Caffey, MD on 06/26/2019  9:01 AM. (History)    patient states vision is stable  Referring physician: [place], [place], [address],  [place] [address]  HISTORICAL INFORMATION:   Selected notes from the MEDICAL RECORD NUMBER Referred by Dr. Parke Simmers for concern of ARMD OU   CURRENT MEDICATIONS: No current outpatient medications on file. (Ophthalmic Drugs)   No current facility-administered medications for this visit. (Ophthalmic Drugs)   Current Outpatient Medications (Other)  Medication Sig  . amLODipine-benazepril (LOTREL) 10-20 MG capsule Take 1 capsule by mouth once daily  . atenolol (TENORMIN) 50 MG tablet Take 1 tablet by mouth once daily  . isosorbide mononitrate (IMDUR) 60 MG 24 hr tablet Take 1 tablet by mouth once daily  . nitroGLYCERIN (NITROSTAT) 0.4 MG SL tablet Place 1 tablet (0.4 mg total) under the tongue every 5 (five) minutes as needed (up to 3 doses).  . pravastatin (PRAVACHOL) 40 MG tablet Take 1 tablet by mouth once daily  . ticagrelor (BRILINTA) 60 MG TABS tablet Take 1 tablet (60 mg total) by mouth 2 (two) times daily.   No current facility-administered medications for this visit. (Other)      REVIEW OF SYSTEMS: ROS    Positive for: Cardiovascular, Eyes   Negative for: Constitutional, Gastrointestinal, Neurological, Skin, Genitourinary, Musculoskeletal, HENT, Endocrine,  Respiratory, Psychiatric, Allergic/Imm, Heme/Lymph   Last edited by Roselee Nova D, COT on 06/26/2019  8:25 AM. (History)       ALLERGIES Allergies  Allergen Reactions  . Keflex [Cephalexin] Other (See Comments)    Unknown   . Zocor [Simvastatin] Other (See Comments)    Unknown   . Lipitor [Atorvastatin] Other (See Comments)    Elevated blood sugar  . Sulfa Antibiotics Nausea Only    PAST MEDICAL HISTORY Past Medical History:  Diagnosis Date  . Asthma   . 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 CAD, multiple vessel 1998   Referred for CABG  . Carotid bruit present 01/2012   a. asymptomatic; carotid doppler 01/2012 - R bulb/prox ICA mild-mod amt of fibrous plaque, L subclavian 70-99% diameter reduction, L bulb/ICA mild amt of fibrous plaque  . Chronic low back pain   . Dyslipidemia, goal LDL below 70   . Hypertension, essential   . Non-ST elevated myocardial infarction (non-STEMI) ([place]) 01/2012   SVG-diagonal occluded, 50% distal left main, 70-80% proximal LAD and 90% mid. Patent LIMA with 70% lesion post anastomosis. SVG to RCA occluded, SVG-OM2-3 90% sequential limb stenosis -- PCI SVG-OM 2 -OM 3: Xience Xpedition DES 2.75  MM x15 MM (3 MM)   . S/P CABG x 5 1998   LIMA-LAD, SVG-OM2-OM3; SVG-rPDA, SVG-D1  . S/P cardiac catheterization 06/28/2013   Cres Angina: a) 60% dLM; 70% Ostial LAD; 90% Cx - no OMs seen; 100% RCA;; SVG-D1 & SVG--dRCA occluded; Patent LIMA-LAD with dLAD ~70-80%;; SVG-OM2-OM3: 95% proximal, 50% mid, Diffuse ISR in BMS-OM2 & DES in Seq limb-OM3;; b) PCI:  prox SVG-OM-OM - Resolute DES 3.0 mm x 26 mm (3.3 mm); PTCA (Angiosculpt) to ISR in Seq limb - ~3.0 mm.; c)  Myoview 08/25/13 - small non-T-mural Inf scar, No Ischemia  . Stented coronary artery 2003; 01/2012; 05/2013   a) '03  BMS SVG-OM2-OM3 (initial limb into OM2);; b) 2013 - DES to Seq Lmg SVG-OM2-OM3 - Xience DES;; c) 05/2013 Prox SVG--OM2-OM3 Resolute DES 3.0 mm x 26 mm; Angiosculpt PTCA of ISR in Seq Limb (3.0 mm)   Past Surgical History:  Procedure Laterality Date  . CATARACT EXTRACTION Bilateral    Dr. Dolores Lory  . CATARACT EXTRACTION W/ INTRAOCULAR LENS  IMPLANT, BILATERAL Bilateral   . CORONARY ANGIOPLASTY WITH STENT PLACEMENT  2003   BMS 3.0 mm x 8 mm stent to SVG to OM & cuttin balloon to diagonal; 100% occluded vein graft to PDA, patent LIMA; 80% stenosis in SVG-diagnal; vein graft stenosis in OM  . CORONARY ARTERY BYPASS GRAFT  1998   SVG-diagonal, DVG-OM, DVG-PDA, LIMA-LAD  . ESOPHAGOGASTRODUODENOSCOPY N/A 06/29/2013   Procedure: ESOPHAGOGASTRODUODENOSCOPY (EGD);  Surgeon: Beryle Beams, MD;  [facility] ENDOSCOPY;  Service: Endoscopy;  Laterality: N/A;  . LEFT HEART CATHETERIZATION WITH CORONARY/GRAFT ANGIOGRAM N/A 01/13/2012   Procedure: LEFT HEART CATHETERIZATION WITH Beatrix Fetters;  Surgeon: Lorretta Harp, MD;  Location: [facility] CATH LAB;  Service: Cardiovascular: PCI & stenting of SVG-OM2 to OM3 segment with Xience Xpedition 2.80mmx15mm (3.80mm) DES  . LEFT HEART CATHETERIZATION WITH CORONARY/GRAFT ANGIOGRAM N/A 06/28/2013   Procedure: LEFT HEART CATHETERIZATION WITH Beatrix Fetters;  Surgeon: Troy Sine, MD;  Location: [facility] CATH LAB;  Service: Cardiovascular: Patent LIMA-LAD (progression of distal LAD ~80%, SVG-Diag, SVG-RCA 100%. PCI to SVG-CxOM2-OM3 99% prox lesion (Resolute DES 3.0 x 26 - 3.4 mm) & PTCA of  95% ISR in sequential limb (3.0 mm Angiosculpt)   . NM MYOVIEW LTD  07/2013   Test looked good!! Evidence of possible old MI / scar, but nothing to suggest ischemia. Not Gated b/c ectopy .  [name] NM MYOVIEW LTD  06/2017   Intermediate risk because of reduced EF but stable.  Stable fixed inferior defect consistent with either diaphragmatic attenuation or inferior infarct.  Septal  hypokinesis/dyskinesis due to LBBB and prior CABG.  EF is 46%.  Stable compared to 2015 following PCI  . TONSILLECTOMY      FAMILY HISTORY Family History  Problem Relation Age of Onset  . Cancer Mother   . Heart attack Father     SOCIAL HISTORY Social History   Tobacco Use  . Smoking status: Former Smoker    Packs/day: 2.00    Years: 30.00    Pack years: 60.00    Types: Cigarettes    Quit date: 03/02/1990    Years since quitting: 29.3  . Smokeless tobacco: Never Used  Substance Use Topics  . Alcohol use: Yes    Comment: 06/28/2013 "aien't drank in years; used to drink a little beer"  . Drug use: No         OPHTHALMIC EXAM:  Base Eye Exam    Visual Acuity (Snellen -  Linear)      Right Left   Dist [place] CF at 3' 20/70 +1   Dist ph [place] NI 20/40       Tonometry (Tonopen, 8:32 AM)      Right Left   Pressure 18 12       Pupils      Dark Light Shape React APD   Right 4 3 Round Brisk None   Left 4 3 Round Brisk None       Visual Fields      Left Right    Full    Restrictions  Central scotoma       Extraocular Movement      Right Left    Full, Ortho Full, Ortho       Neuro/Psych    Oriented x3: Yes   Mood/Affect: Normal       Dilation    Both eyes: 1.0% Mydriacyl, 2.5% Phenylephrine @ 8:32 AM        Slit Lamp and Fundus Exam    Slit Lamp Exam      Right Left   Lids/Lashes Dermatochalasis - upper lid Dermatochalasis - upper lid   Conjunctiva/Sclera White and quiet White and quiet   Cornea 1+ Punctate epithelial erosions, Arcus, 1+endopigment 2-3+ Punctate epithelial erosions, Arcus, 1+endopigment, irregular epi, decreased TBUT   Anterior Chamber deep, 0.5+pigment, mild pigmented vitreous prolapse temporal pupil Deep and quiet   Iris Round and dilated Round and dilated   Lens 3 piece PC IOL displaced inferiorly 3 piece PC IOL in good position   Vitreous Vitreous syneresis, Posterior vitreous detachment, vitreous condensations Vitreous syneresis,  Posterior vitreous detachment       Fundus Exam      Right Left   Disc mild Pallor, Sharp rim mild Pallor, Sharp rim   C/D Ratio 0.3 0.3   Macula Flat, Blunted foveal reflex, Drusen, RPE mottling, clumping and atrophy -- significant central pigment clumping, focal IRH SN macula Flat, Blunted foveal reflex, Drusen, +cystic changes; RPE mottling, clumping and atrophy, +GA surrounding fovea and extending to disc, no heme   Vessels Mild Vascular attenuation, Tortuous Mild Vascular attenuation   Periphery Attached, reticular degeneration Attached, reticular degeneration, flat, round pigmented nevus at 1200 (~1.5DD)          IMAGING AND PROCEDURES  Imaging and Procedures for @TODAY @  OCT, Retina - OU - Both Eyes       Right Eye Quality was good. Central Foveal Thickness: 228. Progression has been stable. Findings include abnormal foveal contour, intraretinal fluid, outer retinal atrophy, subretinal hyper-reflective material, pigment epithelial detachment, retinal drusen , subretinal fluid (Mild interval improvement in IRF).   Left Eye Quality was good. Central Foveal Thickness: 255. Progression has worsened. Findings include normal foveal contour, intraretinal fluid, no SRF, outer retinal atrophy, retinal drusen , pigment epithelial detachment, subretinal hyper-reflective material (Mild Interval increase in IRF).   Notes *Images captured and stored on drive  Diagnosis / Impression:  OD: Non-exu ARMD with central atrophy and cystic changes OD OS: interval increase in low-lying PED/SRHM and cystic changes overlying -- interval conversion to exu AMD  Clinical management:  See below  Abbreviations: NFP - Normal foveal profile. CME - cystoid macular edema. PED - pigment epithelial detachment. IRF - intraretinal fluid. SRF - subretinal fluid. EZ - ellipsoid zone. ERM - epiretinal membrane. ORA - outer retinal atrophy. ORT - outer retinal tubulation. SRHM - subretinal hyper-reflective  material        Intravitreal Injection, Pharmacologic  Agent - OS - Left Eye       Time Out 06/26/2019. 9:34 AM. Confirmed correct patient, procedure, site, and patient consented.   Anesthesia Topical anesthesia was used. Anesthetic medications included Lidocaine 2%, Proparacaine 0.5%.   Procedure Preparation included 5% betadine to ocular surface, eyelid speculum. A (32g) needle was used.   Injection:  1.25 mg Bevacizumab (AVASTIN) SOLN   NDC: [id number], Lot: [id number] , Expiration date: 09/26/2019   Route: Intravitreal, Site: Left Eye, Waste: 0 mL  Post-op Post injection exam found visual acuity of at least counting fingers. The patient tolerated the procedure well. There were no complications. The patient received written and verbal post procedure care education.                 ASSESSMENT/PLAN:    ICD-10-CM   1. Exudative age-related macular degeneration of left eye with active choroidal neovascularization (HCC)  H35.3221 Intravitreal Injection, Pharmacologic Agent - OS - Left Eye    Bevacizumab (AVASTIN) SOLN 1.25 mg  2. Retinal edema  H35.81 OCT, Retina - OU - Both Eyes  3. 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 Essential hypertension  I10   5. Hypertensive retinopathy of both eyes  H35.033   6. Pseudophakia of both eyes  Z96.1   7. Dislocation of intraocular lens, sequela  T85.22XS   8. Vitreous prolapse of right eye  H43.01   9. 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 Exudative age related macular degeneration OS -- interval conversion  - OS with interval conversion to exu ARMD noted on 04.26.21  - OCT shows interval increase in low lying PED / [facility] and overlying IRF/cystic changes  - recommend IVA OS #1 today, 04.26.21  - pt wishes to proceed with IVA  - RBA of procedure discussed, questions answered  - informed consent obtained and signed  - see procedure note   -  Avastin informed consent form signed and scanned on 04.26.2021  - f/u 4 wks  3. Nonexudative ARMD OD  - OD advanced stage with foveal involvement -- BCVA CF 3' from geographic atrophy Age related macular degeneration, non-exudative, both eyes  - The incidence, anatomy, and pathology of dry AMD, risk of progression, and the AREDS and AREDS 2 study including smoking risks discussed with patient.  - Recommend amsler grid monitoring  - f/u 4 weeks  4,5. Hypertensive retinopathy OU  - discussed importance of tight BP control  - monitor  6-8. Pseudophakia OU w/ dislocated IOL and vitreous prolapse OD  - s/p CE/IOL OU (Dr. 05.09.2021)  - OS: 3-piece IOL in good posiiton  - OD: 3-piece IOL dislocated inferiorly with mild vitreous prolapse temporal pupil -- IOL relatively stable  - OD with vision limited by advanced ARMD -- no intervention recommended at this time  - recommend monitoring for now -- may need PPV / IOL exchange if IOL dislocates completely  9. Choroidal nevus OS  - flat, pigmented round lesion at 12 oclock periphery  - 1.5DD in size; no SRF or orange pigment  - monitor    Ophthalmic Meds Ordered this visit:  Meds ordered this encounter  Medications  . Bevacizumab (AVASTIN) SOLN 1.25 mg       Return in about 4 weeks (around 07/24/2019) for f/u exu ARMD OU, DFE, OCT.  There are no Patient Instructions on file for this visit.   This document serves as a record of services  personally performed by Gardiner Sleeper, MD, PhD. It was created on their behalf by Leeann Must, [place], a certified ophthalmic assistant. The creation of this record is the provider's dictation and/or activities during the visit.    Electronically signed by: Leeann Must, COA @TODAY @ 9:56 AM   This document serves as a record of services personally performed by Gardiner Sleeper, MD, PhD. It was created on their behalf by Ernest Mallick, OA, an ophthalmic assistant. The creation of this record is the provider's  dictation and/or activities during the visit.    Electronically signed by: Ernest Mallick, OA 04.26.2021 9:56 AM   Gardiner Sleeper, M.D., Ph.D. Diseases & Surgery of the Retina and Vitreous [facility]  I have reviewed the above documentation for accuracy and completeness, and I agree with the above. Gardiner Sleeper, M.D., Ph.D. 06/26/19 9:56 AM   Abbreviations: M myopia (nearsighted); A astigmatism; H hyperopia (farsighted); P presbyopia; Mrx spectacle prescription;  CTL contact lenses; OD right eye; OS left eye; OU both eyes  XT exotropia; ET esotropia; PEK punctate epithelial keratitis; PEE punctate epithelial erosions; DES dry eye syndrome; MGD meibomian gland dysfunction; ATs artificial tears; PFAT's preservative free artificial tears; [place] nuclear sclerotic cataract; PSC posterior subcapsular cataract; ERM epi-retinal membrane; PVD posterior vitreous detachment; RD retinal detachment; DM diabetes mellitus; DR diabetic retinopathy; NPDR non-proliferative diabetic retinopathy; PDR proliferative diabetic retinopathy; CSME clinically significant macular edema; DME diabetic macular edema; dbh dot blot hemorrhages; CWS cotton wool spot; POAG primary open angle glaucoma; C/D cup-to-disc ratio; HVF humphrey visual field; GVF goldmann visual field; OCT optical coherence tomography; IOP intraocular pressure; BRVO Branch retinal vein occlusion; CRVO central retinal vein occlusion; CRAO central retinal artery occlusion; BRAO branch retinal artery occlusion; RT retinal tear; SB scleral buckle; PPV pars plana vitrectomy; VH Vitreous hemorrhage; PRP panretinal laser photocoagulation; IVK intravitreal kenalog; VMT vitreomacular traction; MH Macular hole;  NVD neovascularization of the disc; NVE neovascularization elsewhere; AREDS age related eye disease study; ARMD age related macular degeneration; POAG primary open angle glaucoma; EBMD epithelial/anterior basement membrane dystrophy; ACIOL  anterior chamber intraocular lens; IOL intraocular lens; PCIOL posterior chamber intraocular lens; Phaco/IOL phacoemulsification with intraocular lens placement; [place] photorefractive keratectomy; LASIK laser assisted in situ keratomileusis; HTN hypertension; DM diabetes mellitus; COPD chronic obstructive pulmonary disease

## 2019-06-26 ENCOUNTER — Encounter (INDEPENDENT_AMBULATORY_CARE_PROVIDER_SITE_OTHER): Payer: Self-pay | Admitting: Ophthalmology

## 2019-06-26 ENCOUNTER — Ambulatory Visit (INDEPENDENT_AMBULATORY_CARE_PROVIDER_SITE_OTHER): Payer: Medicare Other | Admitting: Ophthalmology

## 2019-06-26 DIAGNOSIS — H353221 Exudative age-related macular degeneration, left eye, with active choroidal neovascularization: Secondary | ICD-10-CM | POA: Diagnosis not present

## 2019-06-26 DIAGNOSIS — H353114 Nonexudative age-related macular degeneration, right eye, advanced atrophic with subfoveal involvement: Secondary | ICD-10-CM | POA: Diagnosis not present

## 2019-06-26 DIAGNOSIS — H4301 Vitreous prolapse, right eye: Secondary | ICD-10-CM

## 2019-06-26 DIAGNOSIS — I10 Essential (primary) hypertension: Secondary | ICD-10-CM

## 2019-06-26 DIAGNOSIS — H3581 Retinal edema: Secondary | ICD-10-CM | POA: Diagnosis not present

## 2019-06-26 DIAGNOSIS — H353123 Nonexudative age-related macular degeneration, left eye, advanced atrophic without subfoveal involvement: Secondary | ICD-10-CM

## 2019-06-26 DIAGNOSIS — H35033 Hypertensive retinopathy, bilateral: Secondary | ICD-10-CM

## 2019-06-26 DIAGNOSIS — Z961 Presence of intraocular lens: Secondary | ICD-10-CM

## 2019-06-26 DIAGNOSIS — T8522XS Displacement of intraocular lens, sequela: Secondary | ICD-10-CM

## 2019-06-26 DIAGNOSIS — D3132 Benign neoplasm of left choroid: Secondary | ICD-10-CM

## 2019-06-26 MED ORDER — BEVACIZUMAB CHEMO INJECTION 1.25MG/0.05ML SYRINGE FOR KALEIDOSCOPE
1.2500 mg | INTRAVITREAL | Status: AC | PRN
  Administered 2019-06-26: 1.25 mg via INTRAVITREAL

## 2019-07-18 NOTE — Progress Notes (Signed)
[facility] Note  07/26/2019     CHIEF COMPLAINT Patient presents for Retina Follow Up   HISTORY OF PRESENT ILLNESS: Xavier Giles is a 79 y.o. male who presents to the clinic today for:   HPI    Retina Follow Up    Patient presents with  Wet AMD.  I, the attending physician,  performed the HPI with the patient and updated documentation appropriately.          Comments    4 week follow up NVAMD OS, AMD OD- Vision appears stable since last visit OU.  Patient denies any new problems.         Last edited by Bernarda Caffey, MD on 07/27/2019 10:39 PM. (History)    patient states   Referring physician: Denita Lung, MD [place],  [place] [address]  HISTORICAL INFORMATION:   Selected notes from the MEDICAL RECORD NUMBER Referred by Dr. Parke Simmers for concern of ARMD OU   CURRENT MEDICATIONS: No current outpatient medications on file. (Ophthalmic Drugs)   No current facility-administered medications for this visit. (Ophthalmic Drugs)   Current Outpatient Medications (Other)  Medication Sig  . amLODipine-benazepril (LOTREL) 10-20 MG capsule Take 1 capsule by mouth once daily  . atenolol (TENORMIN) 50 MG tablet Take 1 tablet by mouth once daily  . isosorbide mononitrate (IMDUR) 60 MG 24 hr tablet Take 1 tablet by mouth once daily  . nitroGLYCERIN (NITROSTAT) 0.4 MG SL tablet Place 1 tablet (0.4 mg total) under the tongue every 5 (five) minutes as needed (up to 3 doses).  . pravastatin (PRAVACHOL) 40 MG tablet Take 1 tablet by mouth once daily  . ticagrelor (BRILINTA) 60 MG TABS tablet Take 1 tablet (60 mg total) by mouth 2 (two) times daily.   No current facility-administered medications for this visit. (Other)      REVIEW OF SYSTEMS: ROS    Positive for: Musculoskeletal, Cardiovascular, Eyes   Negative for: Constitutional, Gastrointestinal, Neurological, Skin, Genitourinary, HENT, Endocrine, Respiratory, Psychiatric,  Allergic/Imm, Heme/Lymph   Last edited by Leonie Douglas, COA on 07/26/2019  8:02 AM. (History)       ALLERGIES Allergies  Allergen Reactions  . Keflex [Cephalexin] Other (See Comments)    Unknown   . Zocor [Simvastatin] Other (See Comments)    Unknown   . Lipitor [Atorvastatin] Other (See Comments)    Elevated blood sugar  . Sulfa Antibiotics Nausea Only    PAST MEDICAL HISTORY Past Medical History:  Diagnosis Date  . Asthma   . 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 CAD, multiple vessel 1998   Referred for CABG  . Carotid bruit present 01/2012   a. asymptomatic; carotid doppler 01/2012 - R bulb/prox ICA mild-mod amt of fibrous plaque, L subclavian 70-99% diameter reduction, L bulb/ICA mild amt of fibrous plaque  . Chronic low back pain   . Dyslipidemia, goal LDL below 70   . Hypertension, essential   . Non-ST elevated myocardial infarction (non-STEMI) ([place]) 01/2012   SVG-diagonal occluded, 50% distal left main, 70-80% proximal LAD and 90% mid. Patent LIMA with 70% lesion post anastomosis. SVG to RCA occluded, SVG-OM2-3 90% sequential limb stenosis -- PCI SVG-OM 2 -OM 3: Xience Xpedition DES 2.75 MM x15 MM (3 MM)   .  S/P CABG x 5 1998   LIMA-LAD, SVG-OM2-OM3; SVG-rPDA, SVG-D1  . S/P cardiac catheterization 06/28/2013   Cres Angina: a) 60% dLM; 70% Ostial LAD; 90% Cx - no OMs seen; 100% RCA;; SVG-D1 & SVG--dRCA occluded; Patent LIMA-LAD with dLAD ~70-80%;; SVG-OM2-OM3: 95% proximal, 50% mid, Diffuse ISR in BMS-OM2 & DES in Seq limb-OM3;; b) PCI:  prox SVG-OM-OM - Resolute DES 3.0 mm x 26 mm (3.3 mm); PTCA (Angiosculpt) to ISR in Seq limb - ~3.0 mm.; c)  Myoview 08/25/13 - small non-T-mural Inf scar, No Ischemia  . Stented coronary artery 2003; 01/2012; 05/2013   a) '03 BMS SVG-OM2-OM3 (initial limb  into OM2);; b) 2013 - DES to Seq Lmg SVG-OM2-OM3 - Xience DES;; c) 05/2013 Prox SVG--OM2-OM3 Resolute DES 3.0 mm x 26 mm; Angiosculpt PTCA of ISR in Seq Limb (3.0 mm)   Past Surgical History:  Procedure Laterality Date  . CATARACT EXTRACTION Bilateral    Dr. Dolores Lory  . CATARACT EXTRACTION W/ INTRAOCULAR LENS  IMPLANT, BILATERAL Bilateral   . CORONARY ANGIOPLASTY WITH STENT PLACEMENT  2003   BMS 3.0 mm x 8 mm stent to SVG to OM & cuttin balloon to diagonal; 100% occluded vein graft to PDA, patent LIMA; 80% stenosis in SVG-diagnal; vein graft stenosis in OM  . CORONARY ARTERY BYPASS GRAFT  1998   SVG-diagonal, DVG-OM, DVG-PDA, LIMA-LAD  . ESOPHAGOGASTRODUODENOSCOPY N/A 06/29/2013   Procedure: ESOPHAGOGASTRODUODENOSCOPY (EGD);  Surgeon: Beryle Beams, MD;  Location: [facility] ENDOSCOPY;  Service: Endoscopy;  Laterality: N/A;  . LEFT HEART CATHETERIZATION WITH CORONARY/GRAFT ANGIOGRAM N/A 01/13/2012   Procedure: LEFT HEART CATHETERIZATION WITH Beatrix Fetters;  Surgeon: Lorretta Harp, MD;  Location: [facility] CATH LAB;  Service: Cardiovascular: PCI & stenting of SVG-OM2 to OM3 segment with Xience Xpedition 2.25mmx15mm (3.64mm) DES  . LEFT HEART CATHETERIZATION WITH CORONARY/GRAFT ANGIOGRAM N/A 06/28/2013   Procedure: LEFT HEART CATHETERIZATION WITH Beatrix Fetters;  Surgeon: Troy Sine, MD;  Location: [facility] CATH LAB;  Service: Cardiovascular: Patent LIMA-LAD (progression of distal LAD ~80%, SVG-Diag, SVG-RCA 100%. PCI to SVG-CxOM2-OM3 99% prox lesion (Resolute DES 3.0 x 26 - 3.4 mm) & PTCA of  95% ISR in sequential limb (3.0 mm Angiosculpt)   . NM MYOVIEW LTD  07/2013   Test looked good!! Evidence of possible old MI / scar, but nothing to suggest ischemia. Not Gated b/c ectopy .  [name] NM MYOVIEW LTD  06/2017   Intermediate risk because of reduced EF but stable.  Stable fixed inferior defect consistent with either diaphragmatic attenuation or inferior infarct.  Septal hypokinesis/dyskinesis due to LBBB  and prior CABG.  EF is 46%.  Stable compared to 2015 following PCI  . TONSILLECTOMY      FAMILY HISTORY Family History  Problem Relation Age of Onset  . Cancer Mother   . Heart attack Father     SOCIAL HISTORY Social History   Tobacco Use  . Smoking status: Former Smoker    Packs/day: 2.00    Years: 30.00    Pack years: 60.00    Types: Cigarettes    Quit date: 03/02/1990    Years since quitting: 29.4  . Smokeless tobacco: Never Used  Substance Use Topics  . Alcohol use: Yes    Comment: 06/28/2013 "aien't drank in years; used to drink a little beer"  . Drug use: No         OPHTHALMIC EXAM:  Base Eye Exam    Visual Acuity (Snellen - Linear)      Right Left  Dist [place] CF 3' 20/70 -2   Dist ph [place] NI 20/40 -2       Tonometry (Tonopen, 8:08 AM)      Right Left   Pressure 13 12       Pupils      Dark Light Shape React APD   Right 3 2 Round Brisk None   Left 3 2 Round Brisk None       Visual Fields (Counting fingers)      Left Right    Full Full       Extraocular Movement      Right Left    Full Full       Neuro/Psych    Oriented x3: Yes   Mood/Affect: Normal       Dilation    Both eyes: 1.0% Mydriacyl, 2.5% Phenylephrine @ 8:08 AM        Slit Lamp and Fundus Exam    Slit Lamp Exam      Right Left   Lids/Lashes Dermatochalasis - upper lid Dermatochalasis - upper lid   Conjunctiva/Sclera White and quiet White and quiet   Cornea 1+ Punctate epithelial erosions, Arcus, 1+endopigment 2-3+ Punctate epithelial erosions, Arcus, 1+endopigment, irregular epi, decreased TBUT   Anterior Chamber deep, 0.5+pigment, mild pigmented vitreous prolapse temporal pupil Deep and quiet   Iris Round and dilated Round and dilated   Lens 3 piece PC IOL displaced inferiorly 3 piece PC IOL in good position   Vitreous Vitreous syneresis, Posterior vitreous detachment, vitreous condensations Vitreous syneresis, Posterior vitreous detachment       Fundus Exam      Right  Left   Disc mild Pallor, Sharp rim mild Pallor, Sharp rim, +PPA   C/D Ratio 0.3 0.3   Macula Flat, Blunted foveal reflex, Drusen, RPE mottling, clumping and atrophy -- significant central pigment clumping, focal IRH SN macula Flat, Blunted foveal reflex, Drusen, +cystic changes -- improved; RPE mottling, clumping and atrophy, +GA surrounding fovea and extending to disc, no heme   Vessels Mild Vascular attenuation, Tortuous Mild Vascular attenuation   Periphery Attached, reticular degeneration Attached, reticular degeneration, flat, round pigmented nevus at 1200 (~1.5DD)          IMAGING AND PROCEDURES  Imaging and Procedures for @TODAY @  OCT, Retina - OU - Both Eyes       Right Eye Quality was good. Central Foveal Thickness: 221. Progression has improved. Findings include abnormal foveal contour, intraretinal fluid, outer retinal atrophy, subretinal hyper-reflective material, pigment epithelial detachment, retinal drusen , subretinal fluid (interval improvement in IRF/SRF).   Left Eye Quality was good. Central Foveal Thickness: 242. Progression has improved. Findings include normal foveal contour, no SRF, outer retinal atrophy, retinal drusen , pigment epithelial detachment, subretinal hyper-reflective material, no IRF, vitreomacular adhesion  (Mild Interval improvement in IRF).   Notes *Images captured and stored on drive  Diagnosis / Impression:  OD: Non-exu ARMD with central atrophy and cystic changes OD OS: interval improvement in IRF  Clinical management:  See below  Abbreviations: NFP - Normal foveal profile. CME - cystoid macular edema. PED - pigment epithelial detachment. IRF - intraretinal fluid. SRF - subretinal fluid. EZ - ellipsoid zone. ERM - epiretinal membrane. ORA - outer retinal atrophy. ORT - outer retinal tubulation. SRHM - subretinal hyper-reflective material        Intravitreal Injection, Pharmacologic Agent - OS - Left Eye       Time Out 07/26/2019.  8:26 AM. Confirmed correct patient, procedure, site, and  patient consented.   Anesthesia Topical anesthesia was used. Anesthetic medications included Lidocaine 2%, Tetracaine 0.5%.   Procedure Preparation included 5% betadine to ocular surface, eyelid speculum. A (32g) needle was used.   Injection:  1.25 mg Bevacizumab (AVASTIN) SOLN   NDC: [id number], Lot: [id number] , Expiration date: 09/26/2019   Route: Intravitreal, Site: Left Eye, Waste: 0 mL  Post-op Post injection exam found visual acuity of at least counting fingers. The patient tolerated the procedure well. There were no complications. The patient received written and verbal post procedure care education.                 ASSESSMENT/PLAN:    ICD-10-CM   1. Exudative age-related macular degeneration of left eye with active choroidal neovascularization (HCC)  H35.3221 Intravitreal Injection, Pharmacologic Agent - OS - Left Eye    Bevacizumab (AVASTIN) SOLN 1.25 mg  2. Retinal edema  H35.81 OCT, Retina - OU - Both Eyes  3. 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 Essential hypertension  I10   5. Hypertensive retinopathy of both eyes  H35.033   6. Pseudophakia of both eyes  Z96.1   7. Dislocation of intraocular lens, sequela  T85.22XS   8. Vitreous prolapse of right eye  H43.01   9. 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 Exudative age related macular degeneration OS -- interval conversion  - OS with interval conversion to exu ARMD noted on 04.26.21  - s/p IVA OS #1 (04.26.21)  - OCT shows interval improvement in IRF  - recommend IVA OS #2 today, 05.26.21  - pt wishes to proceed with IVA  - RBA of procedure discussed, questions answered  - informed consent obtained and signed  - see procedure note   - Avastin informed consent form signed and scanned on 04.26.2021  - f/u 4 wks  3. Nonexudative ARMD OD  - OD advanced stage with  foveal involvement -- BCVA CF 3' from geographic atrophy Age related macular degeneration, non-exudative, both eyes  - The incidence, anatomy, and pathology of dry AMD, risk of progression, and the AREDS and AREDS 2 study including smoking risks discussed with patient.  - Recommend amsler grid monitoring  - f/u 4 weeks  4,5. Hypertensive retinopathy OU  - discussed importance of tight BP control  - monitor  6-8. Pseudophakia OU w/ dislocated IOL and vitreous prolapse OD  - s/p CE/IOL OU (Dr. 05.09.2021)  - OS: 3-piece IOL in good posiiton  - OD: 3-piece IOL dislocated inferiorly with mild vitreous prolapse temporal pupil -- IOL relatively stable  - OD with vision limited by advanced ARMD -- no intervention recommended at this time  - recommend monitoring for now -- may need PPV / IOL exchange if IOL dislocates completely  9. Choroidal nevus OS  - flat, pigmented round lesion at 12 oclock periphery  - 1.5DD in size; no SRF or orange pigment  - monitor    Ophthalmic Meds Ordered this visit:  Meds ordered this encounter  Medications  . Bevacizumab (AVASTIN) SOLN 1.25 mg      Return in about 4 weeks (around 08/23/2019) for f/u exu ARMD OS, DFE, OCT.  There are no Patient Instructions on file for this visit.   This document serves as a record of services personally performed by 09/05/2019, MD, PhD. It was created on their behalf by Gardiner Sleeper, OA, an ophthalmic assistant. The creation of this  record is the provider's dictation and/or activities during the visit.    Electronically signed by: Ernest Mallick, OA 05.26.2021 10:40 PM  Gardiner Sleeper, M.D., Ph.D. Diseases & Surgery of the Retina and [place] 07/26/2019   I have reviewed the above documentation for accuracy and completeness, and I agree with the above. Gardiner Sleeper, M.D., Ph.D. 07/27/19 10:40 PM   Abbreviations: M myopia (nearsighted); A astigmatism; H hyperopia (farsighted); P  presbyopia; Mrx spectacle prescription;  CTL contact lenses; OD right eye; OS left eye; OU both eyes  XT exotropia; ET esotropia; PEK punctate epithelial keratitis; PEE punctate epithelial erosions; DES dry eye syndrome; MGD meibomian gland dysfunction; ATs artificial tears; PFAT's preservative free artificial tears; [place] nuclear sclerotic cataract; PSC posterior subcapsular cataract; ERM epi-retinal membrane; PVD posterior vitreous detachment; RD retinal detachment; DM diabetes mellitus; DR diabetic retinopathy; NPDR non-proliferative diabetic retinopathy; PDR proliferative diabetic retinopathy; CSME clinically significant macular edema; DME diabetic macular edema; dbh dot blot hemorrhages; CWS cotton wool spot; POAG primary open angle glaucoma; C/D cup-to-disc ratio; HVF humphrey visual field; GVF goldmann visual field; OCT optical coherence tomography; IOP intraocular pressure; BRVO Branch retinal vein occlusion; CRVO central retinal vein occlusion; CRAO central retinal artery occlusion; BRAO branch retinal artery occlusion; RT retinal tear; SB scleral buckle; PPV pars plana vitrectomy; VH Vitreous hemorrhage; PRP panretinal laser photocoagulation; IVK intravitreal kenalog; VMT vitreomacular traction; MH Macular hole;  NVD neovascularization of the disc; NVE neovascularization elsewhere; AREDS age related eye disease study; ARMD age related macular degeneration; POAG primary open angle glaucoma; EBMD epithelial/anterior basement membrane dystrophy; ACIOL anterior chamber intraocular lens; IOL intraocular lens; PCIOL posterior chamber intraocular lens; Phaco/IOL phacoemulsification with intraocular lens placement; [place] photorefractive keratectomy; LASIK laser assisted in situ keratomileusis; HTN hypertension; DM diabetes mellitus; COPD chronic obstructive pulmonary disease

## 2019-07-25 ENCOUNTER — Other Ambulatory Visit: Payer: Self-pay | Admitting: Cardiology

## 2019-07-26 ENCOUNTER — Encounter (INDEPENDENT_AMBULATORY_CARE_PROVIDER_SITE_OTHER): Payer: Self-pay | Admitting: Ophthalmology

## 2019-07-26 ENCOUNTER — Ambulatory Visit (INDEPENDENT_AMBULATORY_CARE_PROVIDER_SITE_OTHER): Payer: Medicare Other | Admitting: Ophthalmology

## 2019-07-26 DIAGNOSIS — H353114 Nonexudative age-related macular degeneration, right eye, advanced atrophic with subfoveal involvement: Secondary | ICD-10-CM | POA: Diagnosis not present

## 2019-07-26 DIAGNOSIS — H353221 Exudative age-related macular degeneration, left eye, with active choroidal neovascularization: Secondary | ICD-10-CM | POA: Diagnosis not present

## 2019-07-26 DIAGNOSIS — H4301 Vitreous prolapse, right eye: Secondary | ICD-10-CM

## 2019-07-26 DIAGNOSIS — D3132 Benign neoplasm of left choroid: Secondary | ICD-10-CM

## 2019-07-26 DIAGNOSIS — T8522XS Displacement of intraocular lens, sequela: Secondary | ICD-10-CM

## 2019-07-26 DIAGNOSIS — I10 Essential (primary) hypertension: Secondary | ICD-10-CM

## 2019-07-26 DIAGNOSIS — Z961 Presence of intraocular lens: Secondary | ICD-10-CM

## 2019-07-26 DIAGNOSIS — H35033 Hypertensive retinopathy, bilateral: Secondary | ICD-10-CM

## 2019-07-26 DIAGNOSIS — H3581 Retinal edema: Secondary | ICD-10-CM | POA: Diagnosis not present

## 2019-07-27 MED ORDER — BEVACIZUMAB CHEMO INJECTION 1.25MG/0.05ML SYRINGE FOR KALEIDOSCOPE
1.2500 mg | INTRAVITREAL | Status: AC | PRN
  Administered 2019-07-27: 1.25 mg via INTRAVITREAL

## 2019-08-01 ENCOUNTER — Other Ambulatory Visit: Payer: Self-pay | Admitting: Cardiology

## 2019-08-01 NOTE — Telephone Encounter (Signed)
*  STAT* If patient is at the pharmacy, call can be transferred to refill team.   1. Which medications need to be refilled? (please list name of each medication and dose if known)  amLODipine-benazepril (LOTREL) 10-20 MG capsule  2. Which pharmacy/location (including street and city if local pharmacy) is medication to be sent to? [place] ([place]  - [address]  3. Do they need a 30 day or 90 day supply? 90  Pt is out of medication. The pharmacy states they have not gotten any new rx yet

## 2019-08-02 MED ORDER — AMLODIPINE BESY-BENAZEPRIL HCL 10-20 MG PO CAPS: 1.0000 | ORAL_CAPSULE | Freq: Every day | ORAL | 3 refills | Status: DC

## 2019-08-02 NOTE — Addendum Note (Signed)
Addended by: Crissie Reese on: 08/02/2019 09:13 AM   Modules accepted: Orders

## 2019-08-17 NOTE — Progress Notes (Addendum)
[facility] Note  08/24/2019     CHIEF COMPLAINT Patient presents for Retina Follow Up   HISTORY OF PRESENT ILLNESS: Xavier Giles is a 79 y.o. 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  This started months ago.  Severity is moderate.  Duration of 4 weeks.  Since onset it is stable.  I, the attending physician,  performed the HPI with the patient and updated documentation appropriately.          Comments    79 y/o male pt here for 4 wk f/u for exu ARMD OS.  No change in [place] OU.  Denies pain, FOL, floaters.  Uses Blink tears prn OU.       Last edited by Bernarda Caffey, MD on 08/26/2019  6:40 PM. (History)    patient states   Referring physician: No referring provider defined for this encounter.  HISTORICAL INFORMATION:   Selected notes from the MEDICAL RECORD NUMBER Referred by Dr. Parke Simmers for concern of ARMD OU   CURRENT MEDICATIONS: No current outpatient medications on file. (Ophthalmic Drugs)   No current facility-administered medications for this visit. (Ophthalmic Drugs)   Current Outpatient Medications (Other)  Medication Sig  . amLODipine-benazepril (LOTREL) 10-20 MG capsule Take 1 capsule by mouth daily.  [name] atenolol (TENORMIN) 50 MG tablet Take 1 tablet by mouth once daily  . isosorbide mononitrate (IMDUR) 60 MG 24 hr tablet Take 1 tablet by mouth once daily  . nitroGLYCERIN (NITROSTAT) 0.4 MG SL tablet Place 1 tablet (0.4 mg total) under the tongue every 5 (five) minutes as needed (up to 3 doses).  . pravastatin (PRAVACHOL) 40 MG tablet Take 1 tablet by mouth once daily  . ticagrelor (BRILINTA) 60 MG TABS tablet Take 1 tablet (60 mg total) by mouth 2 (two) times daily.   No current facility-administered medications for this visit. (Other)      REVIEW OF SYSTEMS: ROS    Positive for: Cardiovascular, Eyes, Respiratory   Negative for: Constitutional,  Gastrointestinal, Neurological, Skin, Genitourinary, Musculoskeletal, HENT, Endocrine, Psychiatric, Allergic/Imm, Heme/Lymph   Last edited by Matthew Folks, COA on 08/24/2019  8:01 AM. (History)       ALLERGIES Allergies  Allergen Reactions  . Keflex [Cephalexin] Other (See Comments)    Unknown   . Zocor [Simvastatin] Other (See Comments)    Unknown   . Lipitor [Atorvastatin] Other (See Comments)    Elevated blood sugar  . Sulfa Antibiotics Nausea Only    PAST MEDICAL HISTORY Past Medical History:  Diagnosis Date  . Asthma   . 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 CAD, multiple vessel 1998   Referred for CABG  . Carotid bruit present 01/2012   a. asymptomatic; carotid doppler 01/2012 - R bulb/prox ICA mild-mod amt of fibrous plaque, L subclavian 70-99% diameter reduction, L bulb/ICA mild amt of fibrous plaque  . Chronic low back pain   . Dyslipidemia, goal LDL below 70   . Hypertension, essential   . Hypertensive retinopathy    OU  . Macular degeneration    Dry OD, Wet OS  . Non-ST elevated myocardial infarction (non-STEMI) ([place])  01/2012   SVG-diagonal occluded, 50% distal left main, 70-80% proximal LAD and 90% mid. Patent LIMA with 70% lesion post anastomosis. SVG to RCA occluded, SVG-OM2-3 90% sequential limb stenosis -- PCI SVG-OM 2 -OM 3: Xience Xpedition DES 2.75 MM x15 MM (3 MM)   . S/P CABG x 5 1998   LIMA-LAD, SVG-OM2-OM3; SVG-rPDA, SVG-D1  . S/P cardiac catheterization 06/28/2013   Cres Angina: a) 60% dLM; 70% Ostial LAD; 90% Cx - no OMs seen; 100% RCA;; SVG-D1 & SVG--dRCA occluded; Patent LIMA-LAD with dLAD ~70-80%;; SVG-OM2-OM3: 95% proximal, 50% mid, Diffuse ISR in BMS-OM2 & DES in Seq limb-OM3;; b) PCI:  prox SVG-OM-OM - Resolute DES 3.0 mm x 26 mm (3.3 mm); PTCA (Angiosculpt)  to ISR in Seq limb - ~3.0 mm.; c)  Myoview 08/25/13 - small non-T-mural Inf scar, No Ischemia  . Stented coronary artery 2003; 01/2012; 05/2013   a) '03 BMS SVG-OM2-OM3 (initial limb into OM2);; b) 2013 - DES to Seq Lmg SVG-OM2-OM3 - Xience DES;; c) 05/2013 Prox SVG--OM2-OM3 Resolute DES 3.0 mm x 26 mm; Angiosculpt PTCA of ISR in Seq Limb (3.0 mm)   Past Surgical History:  Procedure Laterality Date  . CATARACT EXTRACTION Bilateral    Dr. Dolores Lory  . CATARACT EXTRACTION W/ INTRAOCULAR LENS  IMPLANT, BILATERAL Bilateral   . CORONARY ANGIOPLASTY WITH STENT PLACEMENT  2003   BMS 3.0 mm x 8 mm stent to SVG to OM & cuttin balloon to diagonal; 100% occluded vein graft to PDA, patent LIMA; 80% stenosis in SVG-diagnal; vein graft stenosis in OM  . CORONARY ARTERY BYPASS GRAFT  1998   SVG-diagonal, DVG-OM, DVG-PDA, LIMA-LAD  . ESOPHAGOGASTRODUODENOSCOPY N/A 06/29/2013   Procedure: ESOPHAGOGASTRODUODENOSCOPY (EGD);  Surgeon: Beryle Beams, MD;  Location: [facility] ENDOSCOPY;  Service: Endoscopy;  Laterality: N/A;  . EYE SURGERY Bilateral    Cat Sx - Dr. Dolores Lory  . LEFT HEART CATHETERIZATION WITH CORONARY/GRAFT ANGIOGRAM N/A 01/13/2012   Procedure: LEFT HEART CATHETERIZATION WITH Beatrix Fetters;  Surgeon: Lorretta Harp, MD;  Location: [facility] CATH LAB;  Service: Cardiovascular: PCI & stenting of SVG-OM2 to OM3 segment with Xience Xpedition 2.67mmx15mm (3.71mm) DES  . LEFT HEART CATHETERIZATION WITH CORONARY/GRAFT ANGIOGRAM N/A 06/28/2013   Procedure: LEFT HEART CATHETERIZATION WITH Beatrix Fetters;  Surgeon: Troy Sine, MD;  Location: [facility] C[facility] LAB;  Service: Cardiovascular: Patent LIMA-LAD (progression of distal LAD ~80%, SVG-Diag, SVG-RCA 100%. PCI to SVG-CxOM2-OM3 99% prox lesion (Resolute DES 3.0 x 26 - 3.4 mm) & PTCA of  95% ISR in sequential limb (3.0 mm Angiosculpt)   . NM MYOVIEW LTD  07/2013   Test looked good!! Evidence of possible old MI / scar, but nothing to suggest ischemia. Not Gated b/c  ectopy .  [name] NM MYOVIEW LTD  06/2017   Intermediate risk because of reduced EF but stable.  Stable fixed inferior defect consistent with either diaphragmatic attenuation or inferior infarct.  Septal hypokinesis/dyskinesis due to LBBB and prior CABG.  EF is 46%.  Stable compared to 2015 following PCI  . TONSILLECTOMY      FAMILY HISTORY Family History  Problem Relation Age of Onset  . Cancer Mother   . Heart attack Father     SOCIAL HISTORY Social History   Tobacco Use  . Smoking status: Former Smoker    Packs/day: 2.00    Years: 30.00    Pack years: 60.00    Types: Cigarettes    Quit date: 03/02/1990    Years since quitting: 29.5  .  Smokeless tobacco: Never Used  Substance Use Topics  . Alcohol use: Yes    Comment: 06/28/2013 "aien't drank in years; used to drink a little beer"  . Drug use: No         OPHTHALMIC EXAM:  Base Eye Exam    Visual Acuity (Snellen - Linear)      Right Left   Dist [place] CF @ 2' 20/80   Dist ph [place] NI 20/30 -2       Tonometry (Tonopen, 8:03 AM)      Right Left   Pressure 14 13       Pupils      Dark Light Shape React APD   Right 3 2 Round Brisk None   Left 3 2 Round Brisk None       Visual Fields (Counting fingers)      Left Right    Full Full       Extraocular Movement      Right Left    Full, Ortho Full, Ortho       Neuro/Psych    Oriented x3: Yes   Mood/Affect: Normal       Dilation    Both eyes: 1.0% Mydriacyl, 2.5% Phenylephrine @ 8:03 AM        Slit Lamp and Fundus Exam    Slit Lamp Exam      Right Left   Lids/Lashes Dermatochalasis - upper lid Dermatochalasis - upper lid   Conjunctiva/Sclera White and quiet White and quiet   Cornea 1+ Punctate epithelial erosions, Arcus, 1+endopigment 2-3+ Punctate epithelial erosions, Arcus, 1+endopigment, irregular epi, decreased TBUT   Anterior Chamber deep, 0.5+pigment, mild pigmented vitreous prolapse temporal pupil Deep and quiet   Iris Round and dilated Round and dilated    Lens 3 piece PC IOL displaced inferiorly 3 piece PC IOL in good position   Vitreous Vitreous syneresis, Posterior vitreous detachment, vitreous condensations Vitreous syneresis, Posterior vitreous detachment       Fundus Exam      Right Left   Disc mild Pallor, Sharp rim mild Pallor, Sharp rim, +PPA   C/D Ratio 0.3 0.3   Macula Flat, Blunted foveal reflex, Drusen, RPE mottling, clumping and atrophy -- significant central pigment clumping, focal IRH SN macula - improving Flat, Blunted foveal reflex, Drusen, +cystic changes -- improved; RPE mottling, clumping and atrophy, +GA surrounding fovea and extending to disc, no heme   Vessels Mild Vascular attenuation, mild tortuousity Mild Vascular attenuation   Periphery Attached, reticular degeneration Attached, reticular degeneration, flat, round pigmented nevus at 1200 (~1.5DD)          IMAGING AND PROCEDURES  Imaging and Procedures for @TODAY @  OCT, Retina - OU - Both Eyes       Right Eye Quality was good. Central Foveal Thickness: 230. Progression has been stable. Findings include abnormal foveal contour, intraretinal fluid, outer retinal atrophy, subretinal hyper-reflective material, pigment epithelial detachment, retinal drusen , subretinal fluid (stable improvement in IRF/SRF; trace cystic changes).   Left Eye Quality was good. Central Foveal Thickness: 238. Progression has been stable. Findings include normal foveal contour, no SRF, outer retinal atrophy, retinal drusen , pigment epithelial detachment, subretinal hyper-reflective material, no IRF, vitreomacular adhesion  (stable improvement in IRF).   Notes *Images captured and stored on drive  Diagnosis / Impression:  OD: Non-exu ARMD with central atrophy and cystic changes OD OS: exudative ARMD w/ stable improvement in IRF  Clinical management:  See below  Abbreviations: NFP - Normal foveal  profile. CME - cystoid macular edema. PED - pigment epithelial detachment. IRF -  intraretinal fluid. SRF - subretinal fluid. EZ - ellipsoid zone. ERM - epiretinal membrane. ORA - outer retinal atrophy. ORT - outer retinal tubulation. SRHM - subretinal hyper-reflective material        Intravitreal Injection, Pharmacologic Agent - OS - Left Eye       Time Out 08/24/2019. 8:08 AM. Confirmed correct patient, procedure, site, and patient consented.   Anesthesia Topical anesthesia was used. Anesthetic medications included Lidocaine 2%, Tetracaine 0.5%.   Procedure Preparation included 5% betadine to ocular surface, eyelid speculum. A supplied needle was used.   Injection:  1.25 mg Bevacizumab (AVASTIN) SOLN   NDC: 47425-956-38, Lot: [id number] , Expiration date: 10/25/2019   Route: Intravitreal, Site: Left Eye, Waste: 0 mL  Post-op Post injection exam found visual acuity of at least counting fingers. The patient tolerated the procedure well. There were no complications. The patient received written and verbal post procedure care education.                 ASSESSMENT/PLAN:    ICD-10-CM   1. Exudative age-related macular degeneration of left eye with active choroidal neovascularization (HCC)  H35.3221 Intravitreal Injection, Pharmacologic Agent - OS - Left Eye    Bevacizumab (AVASTIN) SOLN 1.25 mg  2. Retinal edema  H35.81 OCT, Retina - OU - Both Eyes  3. 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 Essential hypertension  I10   5. Hypertensive retinopathy of both eyes  H35.033   6. Pseudophakia of both eyes  Z96.1   7. Dislocation of intraocular lens, sequela  T85.22XS   8. Vitreous prolapse of right eye  H43.01   9. 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 Exudative age related macular degeneration OS -- interval conversion  - OS with interval conversion to exu ARMD noted on 04.26.21  - s/p IVA OS #1 (04.26.21), #2 (05.26.21)  - OCT shows stable improvement in IRF  -  recommend IVA OS #3 today, 06.24.21, maintenance w/ extension to 6 wks  - pt wishes to proceed with IVA  - RBA of procedure discussed, questions answered  - informed consent obtained and signed  - see procedure note   - Avastin informed consent form signed and scanned on 04.26.2021  - f/u 6 wks, DFE, OCT, possible injection - tx and ext as able  3. Nonexudative ARMD OD  - OD advanced stage with foveal involvement -- BCVA CF 3' from geographic atrophy Age related macular degeneration, non-exudative, both eyes  - The incidence, anatomy, and pathology of dry AMD, risk of progression, and the AREDS and AREDS 2 study including smoking risks discussed with patient.  - Recommend amsler grid monitoring  - f/u 6 weeks  4,5. Hypertensive retinopathy OU  - discussed importance of tight BP control  - monitor  6-8. Pseudophakia OU w/ dislocated IOL and vitreous prolapse OD  - s/p CE/IOL OU (Dr. Dolores Lory)  - OS: 3-piece IOL in good posiiton  - OD: 3-piece IOL dislocated inferiorly with mild vitreous prolapse temporal pupil -- IOL relatively stable  - OD with vision limited by advanced ARMD -- no intervention recommended at this time  - recommend monitoring for now -- may need PPV / IOL exchange if IOL dislocates completely  9. Choroidal nevus OS  - flat, pigmented round lesion at 12 oclock periphery  - 1.5DD in size; no  SRF or orange pigment  - monitor    Ophthalmic Meds Ordered this visit:  Meds ordered this encounter  Medications  . Bevacizumab (AVASTIN) SOLN 1.25 mg      Return in about 6 weeks (around 10/05/2019) for f/u exu ARMD OS, DFE, OCT.  There are no Patient Instructions on file for this visit.   This document serves as a record of services personally performed by Gardiner Sleeper, MD, PhD. It was created on their behalf by Leeann Must, [place], a certified ophthalmic assistant. The creation of this record is the provider's dictation and/or activities during the visit.     Electronically signed by: Leeann Must, COA @TODAY @ 7:46 PM   This document serves as a record of services personally performed by Gardiner Sleeper, MD, PhD. It was created on their behalf by San Jetty. Owens Shark, COT, an ophthalmic technician. The creation of this record is the provider's dictation and/or activities during the visit.    Electronically signed by: San Jetty. Owens Shark, [place] 06.24.2021 7:46 PM   Gardiner Sleeper, M.D., Ph.D. Diseases & Surgery of the Retina and Vitreous [facility]  I have reviewed the above documentation for accuracy and completeness, and I agree with the above. Gardiner Sleeper, M.D., Ph.D. 08/26/19 7:46 PM   Abbreviations: M myopia (nearsighted); A astigmatism; H hyperopia (farsighted); P presbyopia; Mrx spectacle prescription;  CTL contact lenses; OD right eye; OS left eye; OU both eyes  XT exotropia; ET esotropia; PEK punctate epithelial keratitis; PEE punctate epithelial erosions; DES dry eye syndrome; MGD meibomian gland dysfunction; ATs artificial tears; PFAT's preservative free artificial tears; [place] nuclear sclerotic cataract; PSC posterior subcapsular cataract; ERM epi-retinal membrane; PVD posterior vitreous detachment; RD retinal detachment; DM diabetes mellitus; DR diabetic retinopathy; NPDR non-proliferative diabetic retinopathy; PDR proliferative diabetic retinopathy; CSME clinically significant macular edema; DME diabetic macular edema; dbh dot blot hemorrhages; CWS cotton wool spot; POAG primary open angle glaucoma; C/D cup-to-disc ratio; HVF humphrey visual field; GVF goldmann visual field; OCT optical coherence tomography; IOP intraocular pressure; BRVO Branch retinal vein occlusion; CRVO central retinal vein occlusion; CRAO central retinal artery occlusion; BRAO branch retinal artery occlusion; RT retinal tear; SB scleral buckle; PPV pars plana vitrectomy; VH Vitreous hemorrhage; PRP panretinal laser photocoagulation; IVK intravitreal  kenalog; VMT vitreomacular traction; MH Macular hole;  NVD neovascularization of the disc; NVE neovascularization elsewhere; AREDS age related eye disease study; ARMD age related macular degeneration; POAG primary open angle glaucoma; EBMD epithelial/anterior basement membrane dystrophy; ACIOL anterior chamber intraocular lens; IOL intraocular lens; PCIOL posterior chamber intraocular lens; Phaco/IOL phacoemulsification with intraocular lens placement; [place] photorefractive keratectomy; LASIK laser assisted in situ keratomileusis; HTN hypertension; DM diabetes mellitus; COPD chronic obstructive pulmonary disease

## 2019-08-24 ENCOUNTER — Encounter (INDEPENDENT_AMBULATORY_CARE_PROVIDER_SITE_OTHER): Payer: Self-pay | Admitting: Ophthalmology

## 2019-08-24 ENCOUNTER — Other Ambulatory Visit: Payer: Self-pay

## 2019-08-24 ENCOUNTER — Ambulatory Visit (INDEPENDENT_AMBULATORY_CARE_PROVIDER_SITE_OTHER): Payer: Medicare Other | Admitting: Ophthalmology

## 2019-08-24 DIAGNOSIS — H353114 Nonexudative age-related macular degeneration, right eye, advanced atrophic with subfoveal involvement: Secondary | ICD-10-CM | POA: Diagnosis not present

## 2019-08-24 DIAGNOSIS — H4301 Vitreous prolapse, right eye: Secondary | ICD-10-CM

## 2019-08-24 DIAGNOSIS — H353221 Exudative age-related macular degeneration, left eye, with active choroidal neovascularization: Secondary | ICD-10-CM | POA: Diagnosis not present

## 2019-08-24 DIAGNOSIS — H3581 Retinal edema: Secondary | ICD-10-CM

## 2019-08-24 DIAGNOSIS — D3132 Benign neoplasm of left choroid: Secondary | ICD-10-CM

## 2019-08-24 DIAGNOSIS — H35033 Hypertensive retinopathy, bilateral: Secondary | ICD-10-CM

## 2019-08-24 DIAGNOSIS — Z961 Presence of intraocular lens: Secondary | ICD-10-CM

## 2019-08-24 DIAGNOSIS — T8522XS Displacement of intraocular lens, sequela: Secondary | ICD-10-CM

## 2019-08-24 DIAGNOSIS — I10 Essential (primary) hypertension: Secondary | ICD-10-CM | POA: Diagnosis not present

## 2019-08-26 ENCOUNTER — Encounter (INDEPENDENT_AMBULATORY_CARE_PROVIDER_SITE_OTHER): Payer: Self-pay | Admitting: Ophthalmology

## 2019-08-26 MED ORDER — BEVACIZUMAB CHEMO INJECTION 1.25MG/0.05ML SYRINGE FOR KALEIDOSCOPE
1.2500 mg | INTRAVITREAL | Status: AC | PRN
Start: 2019-08-26 — End: 2019-08-26
  Administered 2019-08-26: 1.25 mg via INTRAVITREAL

## 2019-09-30 ENCOUNTER — Other Ambulatory Visit: Payer: Self-pay | Admitting: Cardiology

## 2019-10-05 ENCOUNTER — Ambulatory Visit (INDEPENDENT_AMBULATORY_CARE_PROVIDER_SITE_OTHER): Payer: Medicare Other | Admitting: Ophthalmology

## 2019-10-05 ENCOUNTER — Encounter (INDEPENDENT_AMBULATORY_CARE_PROVIDER_SITE_OTHER): Payer: Self-pay | Admitting: Ophthalmology

## 2019-10-05 ENCOUNTER — Other Ambulatory Visit: Payer: Self-pay

## 2019-10-05 DIAGNOSIS — Z961 Presence of intraocular lens: Secondary | ICD-10-CM

## 2019-10-05 DIAGNOSIS — H35033 Hypertensive retinopathy, bilateral: Secondary | ICD-10-CM

## 2019-10-05 DIAGNOSIS — I10 Essential (primary) hypertension: Secondary | ICD-10-CM

## 2019-10-05 DIAGNOSIS — H4301 Vitreous prolapse, right eye: Secondary | ICD-10-CM

## 2019-10-05 DIAGNOSIS — H3581 Retinal edema: Secondary | ICD-10-CM

## 2019-10-05 DIAGNOSIS — H353114 Nonexudative age-related macular degeneration, right eye, advanced atrophic with subfoveal involvement: Secondary | ICD-10-CM | POA: Diagnosis not present

## 2019-10-05 DIAGNOSIS — H353221 Exudative age-related macular degeneration, left eye, with active choroidal neovascularization: Secondary | ICD-10-CM | POA: Diagnosis not present

## 2019-10-05 DIAGNOSIS — D3132 Benign neoplasm of left choroid: Secondary | ICD-10-CM

## 2019-10-05 DIAGNOSIS — T8522XS Displacement of intraocular lens, sequela: Secondary | ICD-10-CM

## 2019-10-05 MED ORDER — BEVACIZUMAB CHEMO INJECTION 1.25MG/0.05ML SYRINGE FOR KALEIDOSCOPE
1.2500 mg | INTRAVITREAL | Status: AC | PRN
  Administered 2019-10-05: 1.25 mg via INTRAVITREAL

## 2019-10-05 NOTE — Progress Notes (Signed)
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  This started 6 weeks ago.  Severity is moderate.  I, the attending physician,  performed the HPI with the patient and updated documentation appropriately.          Comments    Patient here for 6 weeks retina follow up for exu ARMD OS. Patient states vision about the same. No eye pain.        Last edited by Bernarda Caffey, MD on 10/05/2019 11:08 AM. (History)    patient states   Referring physician: No referring provider defined for this encounter.  HISTORICAL INFORMATION:   Selected notes from the MEDICAL RECORD NUMBER Referred by Dr. Parke Simmers for concern of ARMD OU   CURRENT MEDICATIONS: No current outpatient medications on file. (Ophthalmic Drugs)   No current facility-administered medications for this visit. (Ophthalmic Drugs)   Current Outpatient Medications (Other)  Medication Sig   amLODipine-benazepril (LOTREL) 10-20 MG capsule Take 1 capsule by mouth daily.   atenolol (TENORMIN) 50 MG tablet Take 1 tablet by mouth once daily   isosorbide mononitrate (IMDUR) 60 MG 24 hr tablet Take 1 tablet by mouth once daily   nitroGLYCERIN (NITROSTAT) 0.4 MG SL tablet Place 1 tablet (0.4 mg total) under the tongue every 5 (five) minutes as needed (up to 3 doses).   pravastatin (PRAVACHOL) 40 MG tablet Take 1 tablet by mouth once daily   ticagrelor (BRILINTA) 60 MG TABS tablet Take 1 tablet (60 mg total) by mouth 2 (two) times daily.   No current facility-administered medications for this visit. (Other)      REVIEW OF SYSTEMS: ROS    Positive for: Musculoskeletal, Cardiovascular, Eyes, Respiratory   Negative for: Constitutional, Gastrointestinal, Neurological, Skin, Genitourinary,  HENT, Endocrine, Psychiatric, Allergic/Imm, Heme/Lymph   Last edited by Theodore Demark, COA on 10/05/2019  8:36 AM. (History)       ALLERGIES Allergies  Allergen Reactions   Keflex [Cephalexin] Other (See Comments)    Unknown    Zocor [Simvastatin] Other (See Comments)    Unknown    Lipitor [Atorvastatin] Other (See Comments)    Elevated blood sugar   Sulfa Antibiotics Nausea Only    PAST MEDICAL HISTORY Past Medical History:  Diagnosis Date   Asthma    CAD (coronary artery disease) of bypass graft 2003   + Nuc ST: 100% SVG-rPDA; 80% D1 post SVG; 80% SVG-OM; patent LIMA-LAD;; PCI-SVG-OM (prox limb): 3.0 x 8 Zeta BMS- going into native OM2);; PTCA only native D1 through SVG   CAD, multiple vessel 1998   Referred for CABG   Carotid bruit present 01/2012   a. asymptomatic; carotid doppler 01/2012 - R bulb/prox ICA mild-mod amt of fibrous plaque, L subclavian 70-99% diameter reduction, L bulb/ICA mild amt of fibrous plaque   Chronic low back pain    Dyslipidemia, goal LDL below 70    Hypertension, essential    Hypertensive retinopathy    OU   Macular degeneration    Dry OD, Wet OS   Non-ST elevated myocardial infarction (non-STEMI) ([place]) 01/2012   SVG-diagonal occluded, 50% distal left main, 70-80% proximal LAD and 90% mid. Patent LIMA with 70%  lesion post anastomosis. SVG to RCA occluded, SVG-OM2-3 90% sequential limb stenosis -- PCI SVG-OM 2 -OM 3: Xience Xpedition DES 2.75 MM x15 MM (3 MM)    S/P CABG x 5 1998   LIMA-LAD, SVG-OM2-OM3; SVG-rPDA, SVG-D1   S/P cardiac catheterization 06/28/2013   Cres Angina: a) 60% dLM; 70% Ostial LAD; 90% Cx - no OMs seen; 100% RCA;; SVG-D1 & SVG--dRCA occluded; Patent LIMA-LAD with dLAD ~70-80%;; SVG-OM2-OM3: 95% proximal, 50% mid, Diffuse ISR in BMS-OM2 & DES in Seq limb-OM3;; b) PCI:  prox SVG-OM-OM - Resolute DES 3.0 mm x 26 mm (3.3 mm); PTCA (Angiosculpt) to ISR in Seq limb - ~3.0 mm.; c)  Myoview 08/25/13 - small  non-T-mural Inf scar, No Ischemia   Stented coronary artery 2003; 01/2012; 05/2013   a) '03 BMS SVG-OM2-OM3 (initial limb into OM2);; b) 2013 - DES to Seq Lmg SVG-OM2-OM3 - Xience DES;; c) 05/2013 Prox SVG--OM2-OM3 Resolute DES 3.0 mm x 26 mm; Angiosculpt PTCA of ISR in Seq Limb (3.0 mm)   Past Surgical History:  Procedure Laterality Date   CATARACT EXTRACTION Bilateral    Dr. Dolores Lory   CATARACT EXTRACTION W/ INTRAOCULAR LENS  IMPLANT, BILATERAL Bilateral    CORONARY ANGIOPLASTY WITH STENT PLACEMENT  2003   BMS 3.0 mm x 8 mm stent to SVG to OM & cuttin balloon to diagonal; 100% occluded vein graft to PDA, patent LIMA; 80% stenosis in SVG-diagnal; vein graft stenosis in OM   CORONARY ARTERY BYPASS GRAFT  1998   SVG-diagonal, DVG-OM, DVG-PDA, LIMA-LAD   ESOPHAGOGASTRODUODENOSCOPY N/A 06/29/2013   Procedure: ESOPHAGOGASTRODUODENOSCOPY (EGD);  Surgeon: Beryle Beams, MD;  Location: [facility] ENDOSCOPY;  Service: Endoscopy;  Laterality: N/A;   EYE SURGERY Bilateral    Cat Sx - Dr. Dolores Lory   LEFT HEART CATHETERIZATION WITH CORONARY/GRAFT ANGIOGRAM N/A 01/13/2012   Procedure: LEFT HEART CATHETERIZATION WITH Beatrix Fetters;  Surgeon: Lorretta Harp, MD;  Location: [facility] C[facility] LAB;  Service: Cardiovascular: PCI & stenting of SVG-OM2 to OM3 segment with Xience Xpedition 2.5mmx15mm (3.96mm) DES   LEFT HEART CATHETERIZATION WITH CORONARY/GRAFT ANGIOGRAM N/A 06/28/2013   Procedure: LEFT HEART CATHETERIZATION WITH Beatrix Fetters;  Surgeon: Troy Sine, MD;  Location: [facility] CATH LAB;  Service: Cardiovascular: Patent LIMA-LAD (progression of distal LAD ~80%, SVG-Diag, SVG-RCA 100%. PCI to SVG-CxOM2-OM3 99% prox lesion (Resolute DES 3.0 x 26 - 3.4 mm) & PTCA of  95% ISR in sequential limb (3.0 mm Angiosculpt)    NM MYOVIEW LTD  07/2013   Test looked good!! Evidence of possible old MI / scar, but nothing to suggest ischemia. Not Gated b/c ectopy .   NM MYOVIEW LTD  06/2017   Intermediate risk  because of reduced EF but stable.  Stable fixed inferior defect consistent with either diaphragmatic attenuation or inferior infarct.  Septal hypokinesis/dyskinesis due to LBBB and prior CABG.  EF is 46%.  Stable compared to 2015 following PCI   TONSILLECTOMY      FAMILY HISTORY Family History  Problem Relation Age of Onset   Cancer Mother    Heart attack Father     SOCIAL HISTORY Social History   Tobacco Use   Smoking status: Former Smoker    Packs/day: 2.00    Years: 30.00    Pack years: 60.00    Types: Cigarettes    Quit date: 03/02/1990    Years since quitting: 29.6   Smokeless tobacco: Never Used  Substance Use Topics   Alcohol use: Yes    Comment: 06/28/2013 "aien't  drank in years; used to drink a little beer"   Drug use: No         OPHTHALMIC EXAM:  Base Eye Exam    Visual Acuity (Snellen - Linear)      Right Left   Dist cc CF at 3' 20/25 -2   Dist ph cc NI NI       Tonometry (Tonopen, 8:32 AM)      Right Left   Pressure 18 15       Pupils      Dark Light Shape React APD   Right 3 2 Round Brisk None   Left 3 2 Round Brisk None       Visual Fields (Counting fingers)      Left Right    Full Full       Extraocular Movement      Right Left    Full, Ortho Full, Ortho       Neuro/Psych    Oriented x3: Yes   Mood/Affect: Normal       Dilation    Both eyes: 1.0% Mydriacyl, 2.5% Phenylephrine @ 8:32 AM        Slit Lamp and Fundus Exam    Slit Lamp Exam      Right Left   Lids/Lashes Dermatochalasis - upper lid Dermatochalasis - upper lid   Conjunctiva/Sclera White and quiet White and quiet   Cornea 1+ Punctate epithelial erosions, Arcus, 1+endopigment 2-3+ Punctate epithelial erosions, Arcus, 1+endopigment, irregular epi, decreased TBUT   Anterior Chamber deep, 0.5+pigment, mild pigmented vitreous prolapse temporal pupil Deep and quiet   Iris Round and dilated Round and dilated   Lens 3 piece PC IOL displaced inferiorly 3 piece PC IOL  in good position   Vitreous Vitreous syneresis, Posterior vitreous detachment, vitreous condensations Vitreous syneresis, Posterior vitreous detachment       Fundus Exam      Right Left   Disc mild Pallor, Sharp rim mild Pallor, Sharp rim, +PPA   C/D Ratio 0.3 0.3   Macula Flat, Blunted foveal reflex, Drusen, RPE mottling, clumping and atrophy -- prominent central pigment clumping, focal IRH SN macula - improving Flat, Blunted foveal reflex, Drusen, cystic changes --  stably improved; RPE mottling, clumping and atrophy, +GA surrounding fovea and extending to disc, no heme   Vessels Mild Vascular attenuation, mild tortuousity Mild Vascular attenuation   Periphery Attached, reticular degeneration Attached, reticular degeneration, flat, round pigmented nevus at 1200 (~1.5DD)        Refraction    Wearing Rx      Sphere Cylinder Axis Add   Right +2.25 +1.50 008 +0.50   Left +0.00 +2.00 162 +2.75          IMAGING AND PROCEDURES  Imaging and Procedures for @TODAY @  OCT, Retina - OU - Both Eyes       Right Eye Quality was good. Central Foveal Thickness: 232. Progression has been stable. Findings include abnormal foveal contour, intraretinal fluid, outer retinal atrophy, subretinal hyper-reflective material, pigment epithelial detachment, retinal drusen , subretinal fluid (stable improvement in IRF/SRF; trace cystic changes).   Left Eye Quality was borderline. Central Foveal Thickness: 243. Progression has been stable. Findings include normal foveal contour, no SRF, outer retinal atrophy, retinal drusen , pigment epithelial detachment, subretinal hyper-reflective material, no IRF, vitreomacular adhesion  (stable improvement in IRF).   Notes *Images captured and stored on drive  Diagnosis / Impression:  OD: Non-exu ARMD with central atrophy and cystic changes OD OS:  exudative ARMD w/ stable improvement in IRF  Clinical management:  See below  Abbreviations: NFP - Normal foveal  profile. CME - cystoid macular edema. PED - pigment epithelial detachment. IRF - intraretinal fluid. SRF - subretinal fluid. EZ - ellipsoid zone. ERM - epiretinal membrane. ORA - outer retinal atrophy. ORT - outer retinal tubulation. SRHM - subretinal hyper-reflective material        Intravitreal Injection, Pharmacologic Agent - OS - Left Eye       Time Out 10/05/2019. 9:10 AM. Confirmed correct patient, procedure, site, and patient consented.   Anesthesia Topical anesthesia was used. Anesthetic medications included Lidocaine 2%, Proparacaine 0.5%.   Procedure Preparation included 5% betadine to ocular surface, eyelid speculum. A supplied needle was used.   Injection:  1.25 mg Bevacizumab (AVASTIN) SOLN   NDC: 79892-119-41, Lot: [id number] , Expiration date: 10/25/2019   Route: Intravitreal, Site: Left Eye, Waste: 0 mL  Post-op Post injection exam found visual acuity of at least counting fingers. The patient tolerated the procedure well. There were no complications. The patient received written and verbal post procedure care education. Post injection medications were not given.                 ASSESSMENT/PLAN:    ICD-10-CM   1. Exudative age-related macular degeneration of left eye with active choroidal neovascularization (HCC)  H35.3221 Intravitreal Injection, Pharmacologic Agent - OS - Left Eye    Bevacizumab (AVASTIN) SOLN 1.25 mg  2. Retinal edema  H35.81 OCT, Retina - OU - Both Eyes  3. 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 Essential hypertension  I10   5. Hypertensive retinopathy of both eyes  H35.033   6. Pseudophakia of both eyes  Z96.1   7. Dislocation of intraocular lens, sequela  T85.22XS   8. Vitreous prolapse of right eye  H43.01   9. 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 Exudative age related macular degeneration OS  - OS with interval conversion to exu ARMD noted on  04.26.21  - s/p IVA OS #1 (04.26.21), #2 (05.26.21), #3 (06.24.21)  - OCT shows stable improvement in IRF  - recommend IVA OS #4 today, 08.05.21, maintenance w/ extension to 8 wks  - pt wishes to proceed with IVA  - RBA of procedure discussed, questions answered  - informed consent obtained and signed  - see procedure note   - Avastin informed consent form signed and scanned on 04.26.2021  - f/u 8 wks, DFE, OCT, possible injection - tx and ext as able  3. Nonexudative ARMD OD  - OD advanced stage with foveal involvement -- BCVA CF 3' from geographic atrophy Age related macular degeneration, non-exudative, both eyes  - The incidence, anatomy, and pathology of dry AMD, risk of progression, and the AREDS and AREDS 2 study including smoking risks discussed with patient.  - Recommend amsler grid monitoring  - f/u 6 weeks  4,5. Hypertensive retinopathy OU  - discussed importance of tight BP control  - monitor  6-8. Pseudophakia OU w/ dislocated IOL and vitreous prolapse OD  - s/p CE/IOL OU (Dr. 05.09.2021)  - OS: 3-piece IOL in good posiiton  - OD: 3-piece IOL dislocated inferiorly with mild vitreous prolapse temporal pupil -- IOL relatively stable  - OD with vision limited by advanced ARMD -- no intervention recommended at this time  - recommend monitoring for now -- may need PPV / IOL exchange if  IOL dislocates completely  9. Choroidal nevus OS  - flat, pigmented round lesion at 12 oclock periphery  - 1.5DD in size; no SRF or orange pigment  - monitor    Ophthalmic Meds Ordered this visit:  Meds ordered this encounter  Medications   Bevacizumab (AVASTIN) SOLN 1.25 mg      Return in about 8 weeks (around 11/30/2019) for f/u exu ARMD OS, DFE, OCT.  There are no Patient Instructions on file for this visit.   This document serves as a record of services personally performed by Gardiner Sleeper, MD, PhD. It was created on their behalf by Leeann Must, [place], an ophthalmic technician.  The creation of this record is the provider's dictation and/or activities during the visit.    Electronically signed by: Leeann Must, COA @TODAY @ 2:55 PM   Gardiner Sleeper, M.D., Ph.D. Diseases & Surgery of the Retina and Vitreous [facility]  I have reviewed the above documentation for accuracy and completeness, and I agree with the above. Gardiner Sleeper, M.D., Ph.D. 10/05/19 2:55 PM    Abbreviations: M myopia (nearsighted); A astigmatism; H hyperopia (farsighted); P presbyopia; Mrx spectacle prescription;  CTL contact lenses; OD right eye; OS left eye; OU both eyes  XT exotropia; ET esotropia; PEK punctate epithelial keratitis; PEE punctate epithelial erosions; DES dry eye syndrome; MGD meibomian gland dysfunction; ATs artificial tears; PFAT's preservative free artificial tears; [place] nuclear sclerotic cataract; PSC posterior subcapsular cataract; ERM epi-retinal membrane; PVD posterior vitreous detachment; RD retinal detachment; DM diabetes mellitus; DR diabetic retinopathy; NPDR non-proliferative diabetic retinopathy; PDR proliferative diabetic retinopathy; CSME clinically significant macular edema; DME diabetic macular edema; dbh dot blot hemorrhages; CWS cotton wool spot; POAG primary open angle glaucoma; C/D cup-to-disc ratio; HVF humphrey visual field; GVF goldmann visual field; OCT optical coherence tomography; IOP intraocular pressure; BRVO Branch retinal vein occlusion; CRVO central retinal vein occlusion; CRAO central retinal artery occlusion; BRAO branch retinal artery occlusion; RT retinal tear; SB scleral buckle; PPV pars plana vitrectomy; VH Vitreous hemorrhage; PRP panretinal laser photocoagulation; IVK intravitreal kenalog; VMT vitreomacular traction; MH Macular hole;  NVD neovascularization of the disc; NVE neovascularization elsewhere; AREDS age related eye disease study; ARMD age related macular degeneration; POAG primary open angle glaucoma; EBMD  epithelial/anterior basement membrane dystrophy; ACIOL anterior chamber intraocular lens; IOL intraocular lens; PCIOL posterior chamber intraocular lens; Phaco/IOL phacoemulsification with intraocular lens placement; [place] photorefractive keratectomy; LASIK laser assisted in situ keratomileusis; HTN hypertension; DM diabetes mellitus; COPD chronic obstructive pulmonary disease

## 2019-10-17 ENCOUNTER — Other Ambulatory Visit: Payer: Self-pay | Admitting: Cardiology

## 2019-11-15 NOTE — Progress Notes (Signed)
[facility] Note  11/16/2019     CHIEF COMPLAINT Patient presents for Retina Follow Up   HISTORY OF PRESENT ILLNESS: Xavier Giles is a 79 y.o. 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  This started weeks ago.  Severity is moderate.  Duration of weeks.  Since onset it is stable.  I, the attending physician,  performed the HPI with the patient and updated documentation appropriately.          Comments    Pt states vision is stable OU.  Pt denies eye pain or discomfort and denies any new or worsening floaters or fol OU.       Last edited by Bernarda Caffey, MD on 11/16/2019 10:04 AM. (History)    patient states vision is the same as last time  Referring physician: [place], [place], [place],  [place] [address]  HISTORICAL INFORMATION:   Selected notes from the [facility] Referred by Dr. Parke Simmers for concern of ARMD OU   CURRENT MEDICATIONS: No current outpatient medications on file. (Ophthalmic Drugs)   No current facility-administered medications for this visit. (Ophthalmic Drugs)   Current Outpatient Medications (Other)  Medication Sig  . amLODipine-benazepril (LOTREL) 10-20 MG capsule Take 1 capsule by mouth daily.  [name] atenolol (TENORMIN) 50 MG tablet Take 1 tablet by mouth once daily  . isosorbide mononitrate (IMDUR) 60 MG 24 hr tablet Take 1 tablet by mouth once daily  . nitroGLYCERIN (NITROSTAT) 0.4 MG SL tablet Place 1 tablet (0.4 mg total) under the tongue every 5 (five) minutes as needed (up to 3 doses).  . pravastatin (PRAVACHOL) 40 MG tablet Take 1 tablet by mouth once daily  . ticagrelor (BRILINTA) 60 MG TABS tablet Take 1 tablet (60 mg total) by mouth 2 (two) times daily.   No current facility-administered medications for this visit. (Other)      REVIEW OF SYSTEMS: ROS    Positive for: Musculoskeletal, Cardiovascular,  Eyes, Respiratory   Negative for: Constitutional, Gastrointestinal, Neurological, Skin, Genitourinary, HENT, Endocrine, Psychiatric, Allergic/Imm, Heme/Lymph   Last edited by Doneen Poisson on 11/16/2019  9:18 AM. (History)       ALLERGIES Allergies  Allergen Reactions  . Keflex [Cephalexin] Other (See Comments)    Unknown   . Zocor [Simvastatin] Other (See Comments)    Unknown   . Lipitor [Atorvastatin] Other (See Comments)    Elevated blood sugar  . Sulfa Antibiotics Nausea Only    PAST MEDICAL HISTORY Past Medical History:  Diagnosis Date  . Asthma   . 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 CAD, multiple vessel 1998   Referred for CABG  . Carotid bruit present 01/2012   a. asymptomatic; carotid doppler 01/2012 - R bulb/prox ICA mild-mod amt of fibrous plaque, L subclavian 70-99% diameter reduction, L bulb/ICA mild amt of fibrous plaque  . Chronic low back pain   . Dyslipidemia, goal LDL below 70   . Hypertension, essential   . Hypertensive retinopathy    OU  . Macular degeneration    Dry OD, Wet OS  . Non-ST elevated myocardial infarction (non-STEMI) ([place]) 01/2012  SVG-diagonal occluded, 50% distal left main, 70-80% proximal LAD and 90% mid. Patent LIMA with 70% lesion post anastomosis. SVG to RCA occluded, SVG-OM2-3 90% sequential limb stenosis -- PCI SVG-OM 2 -OM 3: Xience Xpedition DES 2.75 MM x15 MM (3 MM)   . S/P CABG x 5 1998   LIMA-LAD, SVG-OM2-OM3; SVG-rPDA, SVG-D1  . S/P cardiac catheterization 06/28/2013   Cres Angina: a) 60% dLM; 70% Ostial LAD; 90% Cx - no OMs seen; 100% RCA;; SVG-D1 & SVG--dRCA occluded; Patent LIMA-LAD with dLAD ~70-80%;; SVG-OM2-OM3: 95% proximal, 50% mid, Diffuse ISR in BMS-OM2 & DES in Seq limb-OM3;; b) PCI:  prox SVG-OM-OM - Resolute DES 3.0 mm x 26 mm  (3.3 mm); PTCA (Angiosculpt) to ISR in Seq limb - ~3.0 mm.; c)  Myoview 08/25/13 - small non-T-mural Inf scar, No Ischemia  . Stented coronary artery 2003; 01/2012; 05/2013   a) '03 BMS SVG-OM2-OM3 (initial limb into OM2);; b) 2013 - DES to Seq Lmg SVG-OM2-OM3 - Xience DES;; c) 05/2013 Prox SVG--OM2-OM3 Resolute DES 3.0 mm x 26 mm; Angiosculpt PTCA of ISR in Seq Limb (3.0 mm)   Past Surgical History:  Procedure Laterality Date  . CATARACT EXTRACTION Bilateral    Dr. Dolores Lory  . CATARACT EXTRACTION W/ INTRAOCULAR LENS  IMPLANT, BILATERAL Bilateral   . CORONARY ANGIOPLASTY WITH STENT PLACEMENT  2003   BMS 3.0 mm x 8 mm stent to SVG to OM & cuttin balloon to diagonal; 100% occluded vein graft to PDA, patent LIMA; 80% stenosis in SVG-diagnal; vein graft stenosis in OM  . CORONARY ARTERY BYPASS GRAFT  1998   SVG-diagonal, DVG-OM, DVG-PDA, LIMA-LAD  . ESOPHAGOGASTRODUODENOSCOPY N/A 06/29/2013   Procedure: ESOPHAGOGASTRODUODENOSCOPY (EGD);  Surgeon: Beryle Beams, MD;  Location[facility] ENDOSCOPY;  Service: Endoscopy;  Laterality: N/A;  . EYE SURGERY Bilateral    Cat Sx - Dr. Dolores Lory  . LEFT HEART CATHETERIZATION WITH CORONARY/GRAFT ANGIOGRAM N/A 01/13/2012   Procedure: LEFT HEART CATHETERIZATION WITH Beatrix Fetters;  Surgeon: Lorretta Harp, MD;  Location: [facility] CATH LAB;  Service: Cardiovascular: PCI & stenting of SVG-OM2 to OM3 segment with Xience Xpedition 2.68mmx15mm (3.28mm) DES  . LEFT HEART CATHETERIZATION WITH CORONARY/GRAFT ANGIOGRAM N/A 06/28/2013   Procedure: LEFT HEART CATHETERIZATION WITH Beatrix Fetters;  Surgeon: Troy Sine, MD;  Location: [facility] CATH LAB;  Service: Cardiovascular: Patent LIMA-LAD (progression of distal LAD ~80%, SVG-Diag, SVG-RCA 100%. PCI to SVG-CxOM2-OM3 99% prox lesion (Resolute DES 3.0 x 26 - 3.4 mm) & PTCA of  95% ISR in sequential limb (3.0 mm Angiosculpt)   . NM MYOVIEW LTD  07/2013   Test looked good!! Evidence of possible old MI / scar, but nothing to  suggest ischemia. Not Gated b/c ectopy .  [name] NM MYOVIEW LTD  06/2017   Intermediate risk because of reduced EF but stable.  Stable fixed inferior defect consistent with either diaphragmatic attenuation or inferior infarct.  Septal hypokinesis/dyskinesis due to LBBB and prior CABG.  EF is 46%.  Stable compared to 2015 following PCI  . TONSILLECTOMY      FAMILY HISTORY Family History  Problem Relation Age of Onset  . Cancer Mother   . Heart attack Father     SOCIAL HISTORY Social History   Tobacco Use  . Smoking status: Former Smoker    Packs/day: 2.00    Years: 30.00    Pack years: 60.00    Types: Cigarettes    Quit date: 03/02/1990    Years since quitting: 29.7  . Smokeless tobacco: Never  Used  Substance Use Topics  . Alcohol use: Yes    Comment: 06/28/2013 "aien't drank in years; used to drink a little beer"  . Drug use: No         OPHTHALMIC EXAM:  Base Eye Exam    Visual Acuity (Snellen - Linear)      Right Left   Dist cc CF @ 3' 20/30 -1   Dist ph cc NI NI   Correction: Glasses       Tonometry (Tonopen, 9:23 AM)      Right Left   Pressure 20 17       Pupils      Dark Light Shape React APD   Right 3 2 Round Brisk 0   Left 3 2 Round Brisk 0       Visual Fields      Left Right    Full Full       Extraocular Movement      Right Left    Full Full       Neuro/Psych    Oriented x3: Yes   Mood/Affect: Normal       Dilation    Both eyes: 1.0% Mydriacyl, 2.5% Phenylephrine @ 9:23 AM        Slit Lamp and Fundus Exam    Slit Lamp Exam      Right Left   Lids/Lashes Dermatochalasis - upper lid Dermatochalasis - upper lid   Conjunctiva/Sclera White and quiet White and quiet   Cornea 1+ Punctate epithelial erosions, Arcus, 1+endopigment 2-3+ Punctate epithelial erosions, Arcus, 1+endopigment, irregular epi, decreased TBUT   Anterior Chamber deep, 0.5+pigment, mild pigmented vitreous prolapse temporal pupil Deep and quiet   Iris Round and dilated Round  and dilated   Lens 3 piece PC IOL displaced inferiorly 3 piece PC IOL in good position   Vitreous Vitreous syneresis, Posterior vitreous detachment, vitreous condensations Vitreous syneresis, Posterior vitreous detachment       Fundus Exam      Right Left   Disc mild Pallor, Sharp rim mild Pallor, Sharp rim, +PPA   C/D Ratio 0.3 0.3   Macula Flat, Blunted foveal reflex, Drusen, RPE mottling, clumping and atrophy -- prominent central pigment clumping, focal IRH SN macula - improving Flat, Blunted foveal reflex, Drusen, cystic changes --  stably improved; RPE mottling, clumping and atrophy, +GA surrounding fovea and extending to disc, no heme   Vessels Mild Vascular attenuation, mild tortuousity Mild Vascular attenuation   Periphery Attached, reticular degeneration Attached, reticular degeneration, flat, round pigmented nevus at 1200 (~1.5DD)        Refraction    Wearing Rx      Sphere Cylinder Axis Add   Right +2.25 +1.50 008 +0.50   Left +0.00 +2.00 162 +2.75          IMAGING AND PROCEDURES  Imaging and Procedures for @TODAY @  OCT, Retina - OU - Both Eyes       Right Eye Quality was borderline. Central Foveal Thickness: 264. Progression has been stable. Findings include abnormal foveal contour, intraretinal fluid, outer retinal atrophy, subretinal hyper-reflective material, pigment epithelial detachment, retinal drusen , subretinal fluid (stable improvement in IRF/SRF; trace cystic changes).   Left Eye Quality was borderline. Central Foveal Thickness: 236. Progression has been stable. Findings include normal foveal contour, no SRF, outer retinal atrophy, retinal drusen , pigment epithelial detachment, subretinal hyper-reflective material, no IRF, vitreomacular adhesion  (stable improvement in IRF; persistent diffuse ORA).   Notes *Images captured and  stored on drive  Diagnosis / Impression:  OD: Non-exu ARMD with central atrophy and cystic changes OD OS: exudative ARMD w/  stable improvement in IRF; persistent diffuse ORA  Clinical management:  See below  Abbreviations: NFP - Normal foveal profile. CME - cystoid macular edema. PED - pigment epithelial detachment. IRF - intraretinal fluid. SRF - subretinal fluid. EZ - ellipsoid zone. ERM - epiretinal membrane. ORA - outer retinal atrophy. ORT - outer retinal tubulation. SRHM - subretinal hyper-reflective material        Intravitreal Injection, Pharmacologic Agent - OS - Left Eye       Time Out 11/16/2019. 10:11 AM. Confirmed correct patient, procedure, site, and patient consented.   Anesthesia Topical anesthesia was used. Anesthetic medications included Lidocaine 2%, Proparacaine 0.5%.   Procedure Preparation included 5% betadine to ocular surface, eyelid speculum. A (32g) needle was used.   Injection:  1.25 mg Bevacizumab (AVASTIN) SOLN   NDC: 16109-604-54, Lot: [id number], Expiration date: 12/28/2019   Route: Intravitreal, Site: Left Eye, Waste: 0.05 mL  Post-op Post injection exam found visual acuity of at least counting fingers. The patient tolerated the procedure well. There were no complications. The patient received written and verbal post procedure care education. Post injection medications were not given.                 ASSESSMENT/PLAN:    ICD-10-CM   1. Exudative age-related macular degeneration of left eye with active choroidal neovascularization (HCC)  H35.3221 Intravitreal Injection, Pharmacologic Agent - OS - Left Eye    Bevacizumab (AVASTIN) SOLN 1.25 mg  2. Retinal edema  H35.81 OCT, Retina - OU - Both Eyes  3. 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 Essential hypertension  I10   5. Hypertensive retinopathy of both eyes  H35.033   6. Pseudophakia of both eyes  Z96.1   7. Dislocation of intraocular lens, sequela  T85.22XS   8. Vitreous prolapse of right eye  H43.01   9. Choroidal nevus of left  eye  D31.32     1,2. Exudative age related macular degeneration OS  - OS with interval conversion to exu ARMD noted on 04.26.21  - s/p IVA OS #1 (04.26.21), #2 (05.26.21), #3 (06.24.21), #4 (08.05.21)  - OCT shows stable improvement in IRF  - recommend IVA OS #5 today, 09.16.21, maintenance w/ interval of 6 weeks  - pt wishes to proceed with IVA  - RBA of procedure discussed, questions answered  - informed consent obtained and signed  - see procedure note   - Avastin informed consent form signed and scanned on 04.26.2021  - f/u 6 wks, DFE, OCT, possible injection - tx and ext as able  3. Nonexudative ARMD OD  - OD advanced stage with foveal involvement -- BCVA CF 3' from geographic atrophy Age related macular degeneration, non-exudative, both eyes  - The incidence, anatomy, and pathology of dry AMD, risk of progression, and the AREDS and AREDS 2 study including smoking risks discussed with patient.  - Recommend amsler grid monitoring  - f/u 6 weeks  4,5. Hypertensive retinopathy OU  - discussed importance of tight BP control  - monitor  6-8. Pseudophakia OU w/ dislocated IOL and vitreous prolapse OD  - s/p CE/IOL OU (Dr. Dolores Lory)  - OS: 3-piece IOL in good posiiton  - OD: 3-piece IOL dislocated inferiorly with mild vitreous prolapse temporal pupil -- IOL relatively stable  - OD with vision limited  by advanced ARMD -- no intervention recommended at this time  - recommend monitoring for now -- may need PPV / IOL exchange if IOL dislocates completely  9. Choroidal nevus OS  - flat, pigmented round lesion at 12 oclock periphery  - 1.5DD in size; no SRF or orange pigment  - monitor    Ophthalmic Meds Ordered this visit:  Meds ordered this encounter  Medications  . Bevacizumab (AVASTIN) SOLN 1.25 mg      Return in about 6 weeks (around 12/28/2019) for f/u exu ARMD OS, DFE, OCT.  There are no Patient Instructions on file for this visit.   This document serves as a record of  services personally performed by Gardiner Sleeper, MD, PhD. It was created on their behalf by Leonie Douglas, an ophthalmic technician. The creation of this record is the provider's dictation and/or activities during the visit.    Electronically signed by: Leonie Douglas COA, 11/16/19  9:38 PM   This document serves as a record of services personally performed by Gardiner Sleeper, MD, PhD. It was created on their behalf by San Jetty. Owens Shark, OA an ophthalmic technician. The creation of this record is the provider's dictation and/or activities during the visit.    Electronically signed by: San Jetty. Marguerita Merles 09.16.2021 9:38 PM  Gardiner Sleeper, M.D., Ph.D. Diseases & Surgery of the Retina and Vitreous [facility]  I have reviewed the above documentation for accuracy and completeness, and I agree with the above. Gardiner Sleeper, M.D., Ph.D. 11/16/19 9:38 PM   Abbreviations: M myopia (nearsighted); A astigmatism; H hyperopia (farsighted); P presbyopia; Mrx spectacle prescription;  CTL contact lenses; OD right eye; OS left eye; OU both eyes  XT exotropia; ET esotropia; PEK punctate epithelial keratitis; PEE punctate epithelial erosions; DES dry eye syndrome; MGD meibomian gland dysfunction; ATs artificial tears; PFAT's preservative free artificial tears; Colon nuclear sclerotic cataract; PSC posterior subcapsular cataract; ERM epi-retinal membrane; PVD posterior vitreous detachment; RD retinal detachment; DM diabetes mellitus; DR diabetic retinopathy; NPDR non-proliferative diabetic retinopathy; PDR proliferative diabetic retinopathy; CSME clinically significant macular edema; DME diabetic macular edema; dbh dot blot hemorrhages; CWS cotton wool spot; POAG primary open angle glaucoma; C/D cup-to-disc ratio; HVF humphrey visual field; GVF goldmann visual field; OCT optical coherence tomography; IOP intraocular pressure; BRVO Branch retinal vein occlusion; CRVO central retinal vein occlusion;  CRAO central retinal artery occlusion; BRAO branch retinal artery occlusion; RT retinal tear; SB scleral buckle; PPV pars plana vitrectomy; VH Vitreous hemorrhage; PRP panretinal laser photocoagulation; IVK intravitreal kenalog; VMT vitreomacular traction; MH Macular hole;  NVD neovascularization of the disc; NVE neovascularization elsewhere; AREDS age related eye disease study; ARMD age related macular degeneration; POAG primary open angle glaucoma; EBMD epithelial/anterior basement membrane dystrophy; ACIOL anterior chamber intraocular lens; IOL intraocular lens; PCIOL posterior chamber intraocular lens; Phaco/IOL phacoemulsification with intraocular lens placement; [place] photorefractive keratectomy; LASIK laser assisted in situ keratomileusis; HTN hypertension; DM diabetes mellitus; COPD chronic obstructive pulmonary disease

## 2019-11-16 ENCOUNTER — Ambulatory Visit (INDEPENDENT_AMBULATORY_CARE_PROVIDER_SITE_OTHER): Payer: Medicare Other | Admitting: Ophthalmology

## 2019-11-16 ENCOUNTER — Encounter (INDEPENDENT_AMBULATORY_CARE_PROVIDER_SITE_OTHER): Payer: Self-pay | Admitting: Ophthalmology

## 2019-11-16 ENCOUNTER — Other Ambulatory Visit: Payer: Self-pay

## 2019-11-16 DIAGNOSIS — H3581 Retinal edema: Secondary | ICD-10-CM

## 2019-11-16 DIAGNOSIS — I10 Essential (primary) hypertension: Secondary | ICD-10-CM

## 2019-11-16 DIAGNOSIS — H353114 Nonexudative age-related macular degeneration, right eye, advanced atrophic with subfoveal involvement: Secondary | ICD-10-CM

## 2019-11-16 DIAGNOSIS — H35033 Hypertensive retinopathy, bilateral: Secondary | ICD-10-CM

## 2019-11-16 DIAGNOSIS — T8522XS Displacement of intraocular lens, sequela: Secondary | ICD-10-CM

## 2019-11-16 DIAGNOSIS — H353221 Exudative age-related macular degeneration, left eye, with active choroidal neovascularization: Secondary | ICD-10-CM | POA: Diagnosis not present

## 2019-11-16 DIAGNOSIS — H4301 Vitreous prolapse, right eye: Secondary | ICD-10-CM

## 2019-11-16 DIAGNOSIS — Z961 Presence of intraocular lens: Secondary | ICD-10-CM

## 2019-11-16 DIAGNOSIS — D3132 Benign neoplasm of left choroid: Secondary | ICD-10-CM

## 2019-11-16 MED ORDER — BEVACIZUMAB CHEMO INJECTION 1.25MG/0.05ML SYRINGE FOR KALEIDOSCOPE
1.2500 mg | INTRAVITREAL | Status: AC | PRN
  Administered 2019-11-16: 1.25 mg via INTRAVITREAL

## 2019-12-22 NOTE — Progress Notes (Signed)
[facility] Note  12/28/2019     CHIEF COMPLAINT Patient presents for Retina Follow Up   HISTORY OF PRESENT ILLNESS: Xavier Giles is a 79 y.o. 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  This started 6 weeks ago.  I, the attending physician,  performed the HPI with the patient and updated documentation appropriately.          Comments    Patient here for 6 weeks retina follow up for exu ARMD OS. Patient states vision about the same. Not great but can see a little bit.       Last edited by Bernarda Caffey, MD on 12/28/2019  9:43 AM. (History)    patient   Referring physician: No referring provider defined for this encounter.  HISTORICAL INFORMATION:   Selected notes from the MEDICAL RECORD NUMBER Referred by Dr. Parke Simmers for concern of ARMD OU   CURRENT MEDICATIONS: No current outpatient medications on file. (Ophthalmic Drugs)   No current facility-administered medications for this visit. (Ophthalmic Drugs)   Current Outpatient Medications (Other)  Medication Sig  . amLODipine-benazepril (LOTREL) 10-20 MG capsule Take 1 capsule by mouth daily.  [name] atenolol (TENORMIN) 50 MG tablet Take 1 tablet by mouth once daily  . isosorbide mononitrate (IMDUR) 60 MG 24 hr tablet Take 1 tablet by mouth once daily  . nitroGLYCERIN (NITROSTAT) 0.4 MG SL tablet Place 1 tablet (0.4 mg total) under the tongue every 5 (five) minutes as needed (up to 3 doses).  . pravastatin (PRAVACHOL) 40 MG tablet Take 1 tablet by mouth once daily  . ticagrelor (BRILINTA) 60 MG TABS tablet Take 1 tablet (60 mg total) by mouth 2 (two) times daily.   No current facility-administered medications for this visit. (Other)      REVIEW OF SYSTEMS: ROS    Positive for: Musculoskeletal, Cardiovascular, Eyes, Respiratory   Negative for: Constitutional, Gastrointestinal, Neurological, Skin, Genitourinary,  HENT, Endocrine, Psychiatric, Allergic/Imm, Heme/Lymph   Last edited by Theodore Demark, COA on 12/28/2019  9:19 AM. (History)       ALLERGIES Allergies  Allergen Reactions  . Keflex [Cephalexin] Other (See Comments)    Unknown   . Zocor [Simvastatin] Other (See Comments)    Unknown   . Lipitor [Atorvastatin] Other (See Comments)    Elevated blood sugar  . Sulfa Antibiotics Nausea Only    PAST MEDICAL HISTORY Past Medical History:  Diagnosis Date  . Asthma   . 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 CAD, multiple vessel 1998   Referred for CABG  . Carotid bruit present 01/2012   a. asymptomatic; carotid doppler 01/2012 - R bulb/prox ICA mild-mod amt of fibrous plaque, L subclavian 70-99% diameter reduction, L bulb/ICA mild amt of fibrous plaque  . Chronic low back pain   . Dyslipidemia, goal LDL below 70   . Hypertension, essential   . Hypertensive retinopathy    OU  . Macular degeneration    Dry OD, Wet OS  . Non-ST elevated myocardial infarction (non-STEMI) ([place]) 01/2012   SVG-diagonal occluded, 50% distal left main, 70-80% proximal LAD and 90% mid. Patent LIMA with 70%  lesion post anastomosis. SVG to RCA occluded, SVG-OM2-3 90% sequential limb stenosis -- PCI SVG-OM 2 -OM 3: Xience Xpedition DES 2.75 MM x15 MM (3 MM)   . S/P CABG x 5 1998   LIMA-LAD, SVG-OM2-OM3; SVG-rPDA, SVG-D1  . S/P cardiac catheterization 06/28/2013   Cres Angina: a) 60% dLM; 70% Ostial LAD; 90% Cx - no OMs seen; 100% RCA;; SVG-D1 & SVG--dRCA occluded; Patent LIMA-LAD with dLAD ~70-80%;; SVG-OM2-OM3: 95% proximal, 50% mid, Diffuse ISR in BMS-OM2 & DES in Seq limb-OM3;; b) PCI:  prox SVG-OM-OM - Resolute DES 3.0 mm x 26 mm (3.3 mm); PTCA (Angiosculpt) to ISR in Seq limb - ~3.0 mm.; c)  Myoview 08/25/13 - small  non-T-mural Inf scar, No Ischemia  . Stented coronary artery 2003; 01/2012; 05/2013   a) '03 BMS SVG-OM2-OM3 (initial limb into OM2);; b) 2013 - DES to Seq Lmg SVG-OM2-OM3 - Xience DES;; c) 05/2013 Prox SVG--OM2-OM3 Resolute DES 3.0 mm x 26 mm; Angiosculpt PTCA of ISR in Seq Limb (3.0 mm)   Past Surgical History:  Procedure Laterality Date  . CATARACT EXTRACTION Bilateral    Dr. Dolores Lory  . CATARACT EXTRACTION W/ INTRAOCULAR LENS  IMPLANT, BILATERAL Bilateral   . CORONARY ANGIOPLASTY WITH STENT PLACEMENT  2003   BMS 3.0 mm x 8 mm stent to SVG to OM & cuttin balloon to diagonal; 100% occluded vein graft to PDA, patent LIMA; 80% stenosis in SVG-diagnal; vein graft stenosis in OM  . CORONARY ARTERY BYPASS GRAFT  1998   SVG-diagonal, DVG-OM, DVG-PDA, LIMA-LAD  . ESOPHAGOGASTRODUODENOSCOPY N/A 06/29/2013   Procedure: ESOPHAGOGASTRODUODENOSCOPY (EGD);  Surgeon: Beryle Beams, MD;  Location[facility] ENDOSCOPY;  Service: Endoscopy;  Laterality: N/A;  . EYE SURGERY Bilateral    Cat Sx - Dr. Dolores Lory  . LEFT HEART CATHETERIZATION WITH CORONARY/GRAFT ANGIOGRAM N/A 01/13/2012   Procedure: LEFT HEART CATHETERIZATION WITH Beatrix Fetters;  Surgeon: Lorretta Harp, MD;  Location: [facility] CATH LAB;  Service: Cardiovascular: PCI & stenting of SVG-OM2 to OM3 segment with Xience Xpedition 2.66mmx15mm (3.74mm) DES  . LEFT HEART CATHETERIZATION WITH CORONARY/GRAFT ANGIOGRAM N/A 06/28/2013   Procedure: LEFT HEART CATHETERIZATION WITH Beatrix Fetters;  Surgeon: Troy Sine, MD;  Location: [facility] CATH LAB;  Service: Cardiovascular: Patent LIMA-LAD (progression of distal LAD ~80%, SVG-Diag, SVG-RCA 100%. PCI to SVG-CxOM2-OM3 99% prox lesion (Resolute DES 3.0 x 26 - 3.4 mm) & PTCA of  95% ISR in sequential limb (3.0 mm Angiosculpt)   . NM MYOVIEW LTD  07/2013   Test looked good!! Evidence of possible old MI / scar, but nothing to suggest ischemia. Not Gated b/c ectopy .  [name] NM MYOVIEW LTD  06/2017   Intermediate risk  because of reduced EF but stable.  Stable fixed inferior defect consistent with either diaphragmatic attenuation or inferior infarct.  Septal hypokinesis/dyskinesis due to LBBB and prior CABG.  EF is 46%.  Stable compared to 2015 following PCI  . TONSILLECTOMY      FAMILY HISTORY Family History  Problem Relation Age of Onset  . Cancer Mother   . Heart attack Father     SOCIAL HISTORY Social History   Tobacco Use  . Smoking status: Former Smoker    Packs/day: 2.00    Years: 30.00    Pack years: 60.00    Types: Cigarettes    Quit date: 03/02/1990    Years since quitting: 29.8  . Smokeless tobacco: Never Used  Substance Use Topics  . Alcohol use: Yes    Comment: 06/28/2013 "aien't  drank in years; used to drink a little beer"  . Drug use: No         OPHTHALMIC EXAM:  Base Eye Exam    Visual Acuity (Snellen - Linear)      Right Left   Dist cc CF at 3' 20/30 -2   Dist ph cc NI NI   Correction: Glasses       Tonometry (Tonopen, 9:16 AM)      Right Left   Pressure 22 19       Pupils      Dark Light Shape React APD   Right 3 2 Round Brisk None   Left 3 2 Round Brisk None       Visual Fields      Left Right    Full Full       Extraocular Movement      Right Left    Full Full       Neuro/Psych    Oriented x3: Yes   Mood/Affect: Normal       Dilation    Both eyes: 1.0% Mydriacyl, 2.5% Phenylephrine @ 9:16 AM        Slit Lamp and Fundus Exam    Slit Lamp Exam      Right Left   Lids/Lashes Dermatochalasis - upper lid Dermatochalasis - upper lid   Conjunctiva/Sclera White and quiet White and quiet   Cornea 1+ Punctate epithelial erosions, Arcus, 1+endopigment 2-3+ Punctate epithelial erosions, Arcus, 1+endopigment, irregular epi, decreased TBUT   Anterior Chamber deep, 0.5+pigment, mild pigmented vitreous prolapse temporal pupil Deep and quiet   Iris Round and dilated Round and dilated   Lens 3 piece PC IOL displaced inferiorly 3 piece PC IOL in good  position   Vitreous Vitreous syneresis, Posterior vitreous detachment, vitreous condensations Vitreous syneresis, Posterior vitreous detachment       Fundus Exam      Right Left   Disc mild Pallor, Sharp rim mild Pallor, Sharp rim, +PPA   C/D Ratio 0.3 0.3   Macula Flat, Blunted foveal reflex, Drusen, RPE mottling, clumping and atrophy -- prominent central pigment clumping, focal IRH SN macula - resolved Flat, Blunted foveal reflex, Drusen, cystic changes --  stably improved; RPE mottling, clumping and atrophy, +GA surrounding fovea and extending to disc, no heme   Vessels Mild Vascular attenuation, mild tortuousity Mild Vascular attenuation   Periphery Attached, reticular degeneration, No heme  Attached, reticular degeneration, flat, round pigmented nevus at 1200 (~1.5DD)        Refraction    Wearing Rx      Sphere Cylinder Axis Add   Right +2.25 +1.50 008 +0.50   Left +0.00 +2.00 162 +2.75          IMAGING AND PROCEDURES  Imaging and Procedures for @TODAY @  OCT, Retina - OU - Both Eyes       Right Eye Quality was good. Central Foveal Thickness: 228. Progression has been stable. Findings include abnormal foveal contour, outer retinal atrophy, subretinal hyper-reflective material, pigment epithelial detachment, retinal drusen , no SRF, no IRF (stable improvement in IRF/cystic changes).   Left Eye Quality was good. Central Foveal Thickness: 250. Progression has been stable. Findings include normal foveal contour, no SRF, outer retinal atrophy, retinal drusen , pigment epithelial detachment, subretinal hyper-reflective material, no IRF (stable improvement in IRF; persistent diffuse ORA, partial PVD).   Notes *Images captured and stored on drive  Diagnosis / Impression:  OD: Non-exu ARMD with stable improvement in IRF/cystic  changes OS: exudative ARMD w/ stable improvement in IRF; persistent diffuse ORA  Clinical management:  See below  Abbreviations: NFP - Normal foveal  profile. CME - cystoid macular edema. PED - pigment epithelial detachment. IRF - intraretinal fluid. SRF - subretinal fluid. EZ - ellipsoid zone. ERM - epiretinal membrane. ORA - outer retinal atrophy. ORT - outer retinal tubulation. SRHM - subretinal hyper-reflective material        Intravitreal Injection, Pharmacologic Agent - OS - Left Eye       Time Out 12/28/2019. 9:19 AM. Confirmed correct patient, procedure, site, and patient consented.   Anesthesia Topical anesthesia was used. Anesthetic medications included Lidocaine 2%, Proparacaine 0.5%.   Procedure Preparation included 5% betadine to ocular surface, eyelid speculum. A supplied needle was used.   Injection:  1.25 mg Bevacizumab (AVASTIN) SOLN   NDC: 23762-831-51, Lot: [id number] , Expiration date: 01/18/2020   Route: Intravitreal, Site: Left Eye, Waste: 0 mL  Post-op Post injection exam found visual acuity of at least counting fingers. The patient tolerated the procedure well. There were no complications. The patient received written and verbal post procedure care education. Post injection medications were not given.                 ASSESSMENT/PLAN:    ICD-10-CM   1. Exudative age-related macular degeneration of left eye with active choroidal neovascularization (HCC)  H35.3221 Intravitreal Injection, Pharmacologic Agent - OS - Left Eye    Bevacizumab (AVASTIN) SOLN 1.25 mg  2. Retinal edema  H35.81 OCT, Retina - OU - Both Eyes  3. 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 Essential hypertension  I10   5. Hypertensive retinopathy of both eyes  H35.033   6. Pseudophakia of both eyes  Z96.1   7. Dislocation of intraocular lens, sequela  T85.22XS   8. Vitreous prolapse of right eye  H43.01   9. 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 Exudative age related macular degeneration OS  - OS with interval conversion to exu ARMD noted on  04.26.21  - s/p IVA OS #1 (04.26.21), #2 (05.26.21), #3 (06.24.21), #4 (08.05.21), #5 (09.16.21)  - OCT shows stable improvement in IRF at 6 wks  - recommend IVA OS #6 today, 10.28.21, maintenance w/ interval of 7 weeks  - pt wishes to proceed with IVA  - RBA of procedure discussed, questions answered  - informed consent obtained and signed  - see procedure note   - Avastin informed consent form signed and scanned on 04.26.2021  - f/u 7 wks, DFE, OCT, possible injection - tx and ext slowly as able (functionally monocular)  3. Nonexudative ARMD OD  - OD advanced stage with foveal involvement -- BCVA CF 3' from geographic atrophy Age related macular degeneration, non-exudative, both eyes  - The incidence, anatomy, and pathology of dry AMD, risk of progression, and the AREDS and AREDS 2 study including smoking risks discussed with patient.  - Recommend amsler grid monitoring  - f/u in 7 weeks  4,5. Hypertensive retinopathy OU  - discussed importance of tight BP control  - montior  6-8. Pseudophakia OU w/ dislocated IOL and vitreous prolapse OD  - s/p CE/IOL OU (Dr. 05.09.2021)  - OS: 3-piece IOL in good posiiton  - OD: 3-piece IOL dislocated inferiorly with mild vitreous prolapse temporal pupil -- IOL relatively stable  - OD with vision limited by advanced ARMD -- no intervention recommended at  this time  - recommend monitoring for now -- may need PPV / IOL exchange if IOL dislocates completely   9. Choroidal nevus OS  - flat, pigmented round lesion at 12 oclock periphery  - 1.5DD in size; no SRF or orange pigment  - monitor    Ophthalmic Meds Ordered this visit:  Meds ordered this encounter  Medications  . Bevacizumab (AVASTIN) SOLN 1.25 mg      Return in about 7 weeks (around 02/15/2020) for f/u exu ARMD OS, DFE, OCT.  There are no Patient Instructions on file for this visit.   This document serves as a record of services personally performed by Gardiner Sleeper, MD, PhD. It was  created on their behalf by Leonie Douglas, an ophthalmic technician. The creation of this record is the provider's dictation and/or activities during the visit.    Electronically signed by: Leonie Douglas COA, 12/28/19  10:02 AM   This document serves as a record of services personally performed by Gardiner Sleeper, MD, PhD. It was created on their behalf by San Jetty. Owens Shark, OA an ophthalmic technician. The creation of this record is the provider's dictation and/or activities during the visit.    Electronically signed by: San Jetty. [place], [place] 10.28.2021 10:02 AM   Gardiner Sleeper, M.D., Ph.D. Diseases & Surgery of the Retina and Vitreous [facility]  I have reviewed the above documentation for accuracy and completeness, and I agree with the above. Gardiner Sleeper, M.D., Ph.D. 12/28/19 10:02 AM   Abbreviations: M myopia (nearsighted); A astigmatism; H hyperopia (farsighted); P presbyopia; Mrx spectacle prescription;  CTL contact lenses; OD right eye; OS left eye; OU both eyes  XT exotropia; ET esotropia; PEK punctate epithelial keratitis; PEE punctate epithelial erosions; DES dry eye syndrome; MGD meibomian gland dysfunction; ATs artificial tears; PFAT's preservative free artificial tears; [place] nuclear sclerotic cataract; PSC posterior subcapsular cataract; ERM epi-retinal membrane; PVD posterior vitreous detachment; RD retinal detachment; DM diabetes mellitus; DR diabetic retinopathy; NPDR non-proliferative diabetic retinopathy; PDR proliferative diabetic retinopathy; CSME clinically significant macular edema; DME diabetic macular edema; dbh dot blot hemorrhages; CWS cotton wool spot; POAG primary open angle glaucoma; C/D cup-to-disc ratio; HVF humphrey visual field; GVF goldmann visual field; OCT optical coherence tomography; IOP intraocular pressure; BRVO Branch retinal vein occlusion; CRVO central retinal vein occlusion; CRAO central retinal artery occlusion; BRAO branch retinal  artery occlusion; RT retinal tear; SB scleral buckle; PPV pars plana vitrectomy; VH Vitreous hemorrhage; PRP panretinal laser photocoagulation; IVK intravitreal kenalog; VMT vitreomacular traction; MH Macular hole;  NVD neovascularization of the disc; NVE neovascularization elsewhere; AREDS age related eye disease study; ARMD age related macular degeneration; POAG primary open angle glaucoma; EBMD epithelial/anterior basement membrane dystrophy; ACIOL anterior chamber intraocular lens; IOL intraocular lens; PCIOL posterior chamber intraocular lens; Phaco/IOL phacoemulsification with intraocular lens placement; [place] photorefractive keratectomy; LASIK laser assisted in situ keratomileusis; HTN hypertension; DM diabetes mellitus; COPD chronic obstructive pulmonary disease

## 2019-12-28 ENCOUNTER — Ambulatory Visit (INDEPENDENT_AMBULATORY_CARE_PROVIDER_SITE_OTHER): Payer: Medicare Other | Admitting: Ophthalmology

## 2019-12-28 ENCOUNTER — Encounter (INDEPENDENT_AMBULATORY_CARE_PROVIDER_SITE_OTHER): Payer: Self-pay | Admitting: Ophthalmology

## 2019-12-28 ENCOUNTER — Other Ambulatory Visit: Payer: Self-pay

## 2019-12-28 DIAGNOSIS — I10 Essential (primary) hypertension: Secondary | ICD-10-CM | POA: Diagnosis not present

## 2019-12-28 DIAGNOSIS — H353114 Nonexudative age-related macular degeneration, right eye, advanced atrophic with subfoveal involvement: Secondary | ICD-10-CM | POA: Diagnosis not present

## 2019-12-28 DIAGNOSIS — Z961 Presence of intraocular lens: Secondary | ICD-10-CM

## 2019-12-28 DIAGNOSIS — H353221 Exudative age-related macular degeneration, left eye, with active choroidal neovascularization: Secondary | ICD-10-CM | POA: Diagnosis not present

## 2019-12-28 DIAGNOSIS — T8522XS Displacement of intraocular lens, sequela: Secondary | ICD-10-CM

## 2019-12-28 DIAGNOSIS — H4301 Vitreous prolapse, right eye: Secondary | ICD-10-CM

## 2019-12-28 DIAGNOSIS — H3581 Retinal edema: Secondary | ICD-10-CM

## 2019-12-28 DIAGNOSIS — D3132 Benign neoplasm of left choroid: Secondary | ICD-10-CM

## 2019-12-28 DIAGNOSIS — H35033 Hypertensive retinopathy, bilateral: Secondary | ICD-10-CM

## 2019-12-28 MED ORDER — BEVACIZUMAB CHEMO INJECTION 1.25MG/0.05ML SYRINGE FOR KALEIDOSCOPE
1.2500 mg | INTRAVITREAL | Status: AC | PRN
  Administered 2019-12-28: 1.25 mg via INTRAVITREAL

## 2020-01-28 ENCOUNTER — Other Ambulatory Visit: Payer: Self-pay | Admitting: Cardiology

## 2020-02-13 NOTE — Progress Notes (Signed)
[facility] Note  02/15/2020     CHIEF COMPLAINT Patient presents for Retina Follow Up   HISTORY OF PRESENT ILLNESS: Xavier Giles is a 79 y.o. 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  This started weeks ago.  Severity is moderate.  Duration of weeks.  Since onset it is stable.  I, the attending physician,  performed the HPI with the patient and updated documentation appropriately.          Comments    Pt states vision is the same OU.  Pt denies eye pain or discomfort and denies any new or worsening floaters or fol OU.       Last edited by Bernarda Caffey, MD on 02/15/2020  8:14 AM. (History)      Referring physician: [name], MD [address],  [place] [address]  HISTORICAL INFORMATION:   Selected notes from the MEDICAL RECORD NUMBER Referred by Dr. Parke Simmers for concern of ARMD OU   CURRENT MEDICATIONS: No current outpatient medications on file. (Ophthalmic Drugs)   No current facility-administered medications for this visit. (Ophthalmic Drugs)   Current Outpatient Medications (Other)  Medication Sig  . amLODipine-benazepril (LOTREL) 10-20 MG capsule Take 1 capsule by mouth daily.  [name] atenolol (TENORMIN) 50 MG tablet Take 1 tablet by mouth once daily  . isosorbide mononitrate (IMDUR) 60 MG 24 hr tablet Take 1 tablet by mouth once daily  . nitroGLYCERIN (NITROSTAT) 0.4 MG SL tablet Place 1 tablet (0.4 mg total) under the tongue Giles 5 (five) minutes as needed (up to 3 doses).  . pravastatin (PRAVACHOL) 40 MG tablet Take 1 tablet by mouth once daily  . ticagrelor (BRILINTA) 60 MG TABS tablet Take 1 tablet (60 mg total) by mouth 2 (two) times daily.   No current facility-administered medications for this visit. (Other)      REVIEW OF SYSTEMS: ROS    Positive for: Musculoskeletal, Cardiovascular, Eyes, Respiratory   Negative for:  Constitutional, Gastrointestinal, Neurological, Skin, Genitourinary, HENT, Endocrine, Psychiatric, Allergic/Imm, Heme/Lymph   Last edited by Doneen Poisson on 02/15/2020  7:48 AM. (History)       ALLERGIES Allergies  Allergen Reactions  . Keflex [Cephalexin] Other (See Comments)    Unknown   . Zocor [Simvastatin] Other (See Comments)    Unknown   . Lipitor [Atorvastatin] Other (See Comments)    Elevated blood sugar  . Sulfa Antibiotics Nausea Only    PAST MEDICAL HISTORY Past Medical History:  Diagnosis Date  . Asthma   . 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 CAD, multiple vessel 1998   Referred for CABG  . Carotid bruit present 01/2012   a. asymptomatic; carotid doppler 01/2012 - R bulb/prox ICA mild-mod amt of fibrous plaque, L subclavian 70-99% diameter reduction, L bulb/ICA mild amt of fibrous plaque  . Chronic low back pain   . Dyslipidemia, goal LDL below 70   . Hypertension, essential   . Hypertensive retinopathy    OU  . Macular degeneration    Dry OD, Wet OS  . Non-ST elevated myocardial infarction (non-STEMI) ([place]) 01/2012   SVG-diagonal  occluded, 50% distal left main, 70-80% proximal LAD and 90% mid. Patent LIMA with 70% lesion post anastomosis. SVG to RCA occluded, SVG-OM2-3 90% sequential limb stenosis -- PCI SVG-OM 2 -OM 3: Xience Xpedition DES 2.75 MM x15 MM (3 MM)   . S/P CABG x 5 1998   LIMA-LAD, SVG-OM2-OM3; SVG-rPDA, SVG-D1  . S/P cardiac catheterization 06/28/2013   Cres Angina: a) 60% dLM; 70% Ostial LAD; 90% Cx - no OMs seen; 100% RCA;; SVG-D1 & SVG--dRCA occluded; Patent LIMA-LAD with dLAD ~70-80%;; SVG-OM2-OM3: 95% proximal, 50% mid, Diffuse ISR in BMS-OM2 & DES in Seq limb-OM3;; b) PCI:  prox SVG-OM-OM - Resolute DES 3.0 mm x 26 mm (3.3 mm); PTCA (Angiosculpt) to  ISR in Seq limb - ~3.0 mm.; c)  Myoview 08/25/13 - small non-T-mural Inf scar, No Ischemia  . Stented coronary artery 2003; 01/2012; 05/2013   a) '03 BMS SVG-OM2-OM3 (initial limb into OM2);; b) 2013 - DES to Seq Lmg SVG-OM2-OM3 - Xience DES;; c) 05/2013 Prox SVG--OM2-OM3 Resolute DES 3.0 mm x 26 mm; Angiosculpt PTCA of ISR in Seq Limb (3.0 mm)   Past Surgical History:  Procedure Laterality Date  . CATARACT EXTRACTION Bilateral    Dr. Dolores Lory  . CATARACT EXTRACTION W/ INTRAOCULAR LENS  IMPLANT, BILATERAL Bilateral   . CORONARY ANGIOPLASTY WITH STENT PLACEMENT  2003   BMS 3.0 mm x 8 mm stent to SVG to OM & cuttin balloon to diagonal; 100% occluded vein graft to PDA, patent LIMA; 80% stenosis in SVG-diagnal; vein graft stenosis in OM  . CORONARY ARTERY BYPASS GRAFT  1998   SVG-diagonal, DVG-OM, DVG-PDA, LIMA-LAD  . ESOPHAGOGASTRODUODENOSCOPY N/A 06/29/2013   Procedure: ESOPHAGOGASTRODUODENOSCOPY (EGD);  Surgeon: Beryle Beams, MD;  Location: [facility] ENDOSCOPY;  Service: Endoscopy;  Laterality: N/A;  . EYE SURGERY Bilateral    Cat Sx - Dr. Dolores Lory  . LEFT HEART CATHETERIZATION WITH CORONARY/GRAFT ANGIOGRAM N/A 01/13/2012   Procedure: LEFT HEART CATHETERIZATION WITH Beatrix Fetters;  Surgeon: Lorretta Harp, MD;  Location: [facility] CATH LAB;  Service: Cardiovascular: PCI & stenting of SVG-OM2 to OM3 segment with Xience Xpedition 2.19mmx15mm (3.7mm) DES  . LEFT HEART CATHETERIZATION WITH CORONARY/GRAFT ANGIOGRAM N/A 06/28/2013   Procedure: LEFT HEART CATHETERIZATION WITH Beatrix Fetters;  Surgeon: Troy Sine, MD;  Location: [facility] CATH LAB;  Service: Cardiovascular: Patent LIMA-LAD (progression of distal LAD ~80%, SVG-Diag, SVG-RCA 100%. PCI to SVG-CxOM2-OM3 99% prox lesion (Resolute DES 3.0 x 26 - 3.4 mm) & PTCA of  95% ISR in sequential limb (3.0 mm Angiosculpt)   . NM MYOVIEW LTD  07/2013   Test looked good!! Evidence of possible old MI / scar, but nothing to suggest ischemia. Not Gated b/c ectopy  .  [name] NM MYOVIEW LTD  06/2017   Intermediate risk because of reduced EF but stable.  Stable fixed inferior defect consistent with either diaphragmatic attenuation or inferior infarct.  Septal hypokinesis/dyskinesis due to LBBB and prior CABG.  EF is 46%.  Stable compared to 2015 following PCI  . TONSILLECTOMY      FAMILY HISTORY Family History  Problem Relation Age of Onset  . Cancer Mother   . Heart attack Father     SOCIAL HISTORY Social History   Tobacco Use  . Smoking status: Former Smoker    Packs/day: 2.00    Years: 30.00    Pack years: 60.00    Types: Cigarettes    Quit date: 03/02/1990    Years since quitting: 29.9  . Smokeless tobacco: Never Used  Substance Use Topics  . Alcohol use: Yes    Comment: 06/28/2013 "aien't drank in years; used to drink a little beer"  . Drug use: No         OPHTHALMIC EXAM:  Base Eye Exam    Visual Acuity (Snellen - Linear)      Right Left   Dist cc CF @ 3' 20/40 +2   Dist ph cc NI NI   Correction: Glasses       Tonometry (Tonopen, 7:50 AM)      Right Left   Pressure 20 15       Pupils      Dark Light Shape React APD   Right 3 2 Round Brisk 0   Left 3 2 Round Brisk 0       Visual Fields      Left Right    Full Full       Extraocular Movement      Right Left    Full Full       Neuro/Psych    Oriented x3: Yes   Mood/Affect: Normal       Dilation    Both eyes: 1.0% Mydriacyl, 2.5% Phenylephrine @ 7:50 AM        Slit Lamp and Fundus Exam    Slit Lamp Exam      Right Left   Lids/Lashes Dermatochalasis - upper lid Dermatochalasis - upper lid   Conjunctiva/Sclera White and quiet White and quiet   Cornea 1+ Punctate epithelial erosions, Arcus, 1+endopigment 2-3+ Punctate epithelial erosions, Arcus, 1+endopigment, irregular epi, decreased TBUT   Anterior Chamber deep, 0.5+pigment, mild pigmented vitreous prolapse temporal pupil Deep and quiet   Iris Round and dilated Round and dilated   Lens 3 piece PC IOL  displaced inferiorly 3 piece PC IOL in good position   Vitreous Vitreous syneresis, Posterior vitreous detachment, vitreous condensations Vitreous syneresis, Posterior vitreous detachment       Fundus Exam      Right Left   Disc mild Pallor, Sharp rim mild Pallor, Sharp rim, +PPA   C/D Ratio 0.3 0.3   Macula Flat, Blunted foveal reflex, Drusen, RPE mottling, clumping and atrophy -- prominent central pigment clumping, focal IRH SN macula - resolved Flat, Blunted foveal reflex, Drusen, cystic changes --  stably improved; RPE mottling, clumping and atrophy, +GA surrounding fovea and extending to disc, no heme   Vessels Mild Vascular attenuation, mild tortuousity Mild Vascular attenuation   Periphery Attached, reticular degeneration, No heme  Attached, reticular degeneration, flat, round pigmented nevus at 1200 (~1.5DD)        Refraction    Wearing Rx      Sphere Cylinder Axis Add   Right +2.25 +1.50 008 +0.50   Left +0.00 +2.00 162 +2.75          IMAGING AND PROCEDURES  Imaging and Procedures for @TODAY @  OCT, Retina - OU - Both Eyes       Right Eye Quality was good. Central Foveal Thickness: 217. Progression has been stable. Findings include abnormal foveal contour, outer retinal atrophy, subretinal hyper-reflective material, pigment epithelial detachment, retinal drusen , no SRF, no IRF (stable improvement in IRF/cystic changes).   Left Eye Quality was good. Central Foveal Thickness: 231. Progression has been stable. Findings include normal foveal contour, no SRF, outer retinal atrophy, retinal drusen , pigment epithelial detachment, subretinal hyper-reflective material, no IRF (stable improvement in IRF; persistent diffuse ORA, partial PVD).   Notes *Images captured and stored on  drive  Diagnosis / Impression:  OD: Non-exu ARMD with stable improvement in IRF/cystic changes OS: exudative ARMD w/ stable improvement in IRF; persistent diffuse ORA  Clinical management:  See  below  Abbreviations: NFP - Normal foveal profile. CME - cystoid macular edema. PED - pigment epithelial detachment. IRF - intraretinal fluid. SRF - subretinal fluid. EZ - ellipsoid zone. ERM - epiretinal membrane. ORA - outer retinal atrophy. ORT - outer retinal tubulation. SRHM - subretinal hyper-reflective material        Intravitreal Injection, Pharmacologic Agent - OS - Left Eye       Time Out 02/15/2020. 8:18 AM. Confirmed correct patient, procedure, site, and patient consented.   Anesthesia Topical anesthesia was used. Anesthetic medications included Lidocaine 2%, Proparacaine 0.5%.   Procedure Preparation included 5% betadine to ocular surface, eyelid speculum. A (32g) needle was used.   Injection:  1.25 mg Bevacizumab (AVASTIN) 1.25mg /0.81mL SOLN   NDC: 32202-542-70, Lot: [id number], Expiration date: 04/01/2020   Route: Intravitreal, Site: Left Eye, Waste: 0.05 mL  Post-op Post injection exam found visual acuity of at least counting fingers. The patient tolerated the procedure well. There were no complications. The patient received written and verbal post procedure care education. Post injection medications were not given.                 ASSESSMENT/PLAN:    ICD-10-CM   1. Exudative age-related macular degeneration of left eye with active choroidal neovascularization (HCC)  H35.3221 Intravitreal Injection, Pharmacologic Agent - OS - Left Eye    Bevacizumab (AVASTIN) SOLN 1.25 mg  2. Retinal edema  H35.81 OCT, Retina - OU - Both Eyes  3. 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 Essential hypertension  I10   5. Hypertensive retinopathy of both eyes  H35.033   6. Pseudophakia of both eyes  Z96.1   7. Dislocation of intraocular lens, sequela  T85.22XS   8. Vitreous prolapse of right eye  H43.01   9. 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 Exudative age related macular degeneration  OS  - OS with interval conversion to exu ARMD noted on 04.26.21  - s/p IVA OS #1 (04.26.21), #2 (05.26.21), #3 (06.24.21), #4 (08.05.21), #5 (09.16.21), #6 (10.28.21)  - BCVA 20/40 from 20/30  - OCT shows stable improvement in IRF at 7 wks  - recommend IVA OS #7 today, 12.16.21, maintenance, inc w/ interval to 8 weeks  - pt wishes to proceed with IVA  - RBA of procedure discussed, questions answered  - informed consent obtained and signed  - see procedure note   - Avastin informed consent form signed and scanned on 04.26.2021  - f/u 8 wks, DFE, OCT, possible injection - tx and ext slowly as able (functionally monocular)  3. Nonexudative ARMD OD  - OD advanced stage with foveal involvement -- BCVA CF 3' from geographic atrophy Age related macular degeneration, non-exudative, both eyes  - The incidence, anatomy, and pathology of dry AMD, risk of progression, and the AREDS and AREDS 2 study including smoking risks discussed with patient.  - Recommend amsler grid monitoring  - f/u in 7 weeks  4,5. Hypertensive retinopathy OU  - discussed importance of tight BP control  - monitor  6-8. Pseudophakia OU w/ dislocated IOL and vitreous prolapse OD  - s/p CE/IOL OU (Dr. Dolores Lory)  - OS: 3-piece IOL in good posiiton  - OD: 3-piece IOL dislocated inferiorly  with mild vitreous prolapse temporal pupil -- IOL relatively stable  - OD with vision limited by advanced ARMD -- no intervention recommended at this time  - recommend monitoring for now -- may need PPV / IOL exchange if IOL dislocates completely   9. Choroidal nevus OS  - flat, pigmented round lesion at 12 oclock periphery  - 1.5DD in size; no SRF or orange pigment  - monitor    Ophthalmic Meds Ordered this visit:  Meds ordered this encounter  Medications  . Bevacizumab (AVASTIN) SOLN 1.25 mg      Return in about 8 weeks (around 04/11/2020) for f/u exu ARMD OS, DFE, OCT.  There are no Patient Instructions on file for this visit.    This document serves as a record of services personally performed by Gardiner Sleeper, MD, PhD. It was created on their behalf by Leonie Douglas, an ophthalmic technician. The creation of this record is the provider's dictation and/or activities during the visit.    Electronically signed by: Leonie Douglas COA, 02/15/20  10:03 PM   Gardiner Sleeper, M.D., Ph.D. Diseases & Surgery of the Retina and Vitreous [facility]  I have reviewed the above documentation for accuracy and completeness, and I agree with the above. Gardiner Sleeper, M.D., Ph.D. 02/15/20 10:03 PM    Abbreviations: M myopia (nearsighted); A astigmatism; H hyperopia (farsighted); P presbyopia; Mrx spectacle prescription;  CTL contact lenses; OD right eye; OS left eye; OU both eyes  XT exotropia; ET esotropia; PEK punctate epithelial keratitis; PEE punctate epithelial erosions; DES dry eye syndrome; MGD meibomian gland dysfunction; ATs artificial tears; PFAT's preservative free artificial tears; [place] nuclear sclerotic cataract; PSC posterior subcapsular cataract; ERM epi-retinal membrane; PVD posterior vitreous detachment; RD retinal detachment; DM diabetes mellitus; DR diabetic retinopathy; NPDR non-proliferative diabetic retinopathy; PDR proliferative diabetic retinopathy; CSME clinically significant macular edema; DME diabetic macular edema; dbh dot blot hemorrhages; CWS cotton wool spot; POAG primary open angle glaucoma; C/D cup-to-disc ratio; HVF humphrey visual field; GVF goldmann visual field; OCT optical coherence tomography; IOP intraocular pressure; BRVO Branch retinal vein occlusion; CRVO central retinal vein occlusion; CRAO central retinal artery occlusion; BRAO branch retinal artery occlusion; RT retinal tear; SB scleral buckle; PPV pars plana vitrectomy; VH Vitreous hemorrhage; PRP panretinal laser photocoagulation; IVK intravitreal kenalog; VMT vitreomacular traction; MH Macular hole;  NVD  neovascularization of the disc; NVE neovascularization elsewhere; AREDS age related eye disease study; ARMD age related macular degeneration; POAG primary open angle glaucoma; EBMD epithelial/anterior basement membrane dystrophy; ACIOL anterior chamber intraocular lens; IOL intraocular lens; PCIOL posterior chamber intraocular lens; Phaco/IOL phacoemulsification with intraocular lens placement; [place] photorefractive keratectomy; LASIK laser assisted in situ keratomileusis; HTN hypertension; DM diabetes mellitus; COPD chronic obstructive pulmonary disease

## 2020-02-15 ENCOUNTER — Other Ambulatory Visit: Payer: Self-pay

## 2020-02-15 ENCOUNTER — Encounter (INDEPENDENT_AMBULATORY_CARE_PROVIDER_SITE_OTHER): Payer: Self-pay | Admitting: Ophthalmology

## 2020-02-15 ENCOUNTER — Ambulatory Visit (INDEPENDENT_AMBULATORY_CARE_PROVIDER_SITE_OTHER): Payer: Medicare Other | Admitting: Ophthalmology

## 2020-02-15 DIAGNOSIS — H3581 Retinal edema: Secondary | ICD-10-CM

## 2020-02-15 DIAGNOSIS — I10 Essential (primary) hypertension: Secondary | ICD-10-CM

## 2020-02-15 DIAGNOSIS — H4301 Vitreous prolapse, right eye: Secondary | ICD-10-CM

## 2020-02-15 DIAGNOSIS — H35033 Hypertensive retinopathy, bilateral: Secondary | ICD-10-CM

## 2020-02-15 DIAGNOSIS — Z961 Presence of intraocular lens: Secondary | ICD-10-CM

## 2020-02-15 DIAGNOSIS — D3132 Benign neoplasm of left choroid: Secondary | ICD-10-CM

## 2020-02-15 DIAGNOSIS — H353221 Exudative age-related macular degeneration, left eye, with active choroidal neovascularization: Secondary | ICD-10-CM | POA: Diagnosis not present

## 2020-02-15 DIAGNOSIS — T8522XS Displacement of intraocular lens, sequela: Secondary | ICD-10-CM

## 2020-02-15 DIAGNOSIS — H353114 Nonexudative age-related macular degeneration, right eye, advanced atrophic with subfoveal involvement: Secondary | ICD-10-CM

## 2020-02-15 MED ORDER — BEVACIZUMAB CHEMO INJECTION 1.25MG/0.05ML SYRINGE FOR KALEIDOSCOPE
1.2500 mg | INTRAVITREAL | Status: AC | PRN
  Administered 2020-02-15: 22:00:00 1.25 mg via INTRAVITREAL

## 2020-03-01 ENCOUNTER — Other Ambulatory Visit: Payer: Self-pay | Admitting: Cardiology

## 2020-03-25 ENCOUNTER — Other Ambulatory Visit: Payer: Self-pay | Admitting: Cardiology

## 2020-04-08 NOTE — Progress Notes (Signed)
[facility] Note  04/11/2020     CHIEF COMPLAINT Patient presents for Retina Follow Up   HISTORY OF PRESENT ILLNESS: Xavier Giles is a 80 y.o. 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  This started 8 weeks ago.  I, the attending physician,  performed the HPI with the patient and updated documentation appropriately.          Comments    Patient here for 8 weeks retina follow up for exu ARMD OS. Patient states vision about the same. No eye pain.        Last edited by Bernarda Caffey, MD on 04/11/2020  8:11 AM. (History)       Referring physician: [name], MD [address],  [place] [address]  HISTORICAL INFORMATION:   Selected notes from the MEDICAL RECORD NUMBER Referred by Dr. Parke Simmers for concern of ARMD OU   CURRENT MEDICATIONS: No current outpatient medications on file. (Ophthalmic Drugs)   No current facility-administered medications for this visit. (Ophthalmic Drugs)   Current Outpatient Medications (Other)  Medication Sig  . pravastatin (PRAVACHOL) 40 MG tablet Take 1 tablet by mouth once daily  . amLODipine-benazepril (LOTREL) 10-20 MG capsule Take 1 capsule by mouth daily.  [name] atenolol (TENORMIN) 50 MG tablet Take 1 tablet by mouth once daily  . isosorbide mononitrate (IMDUR) 60 MG 24 hr tablet Take 1 tablet by mouth once daily  . nitroGLYCERIN (NITROSTAT) 0.4 MG SL tablet Place 1 tablet (0.4 mg total) under the tongue every 5 (five) minutes as needed (up to 3 doses).  . ticagrelor (BRILINTA) 60 MG TABS tablet Take 1 tablet (60 mg total) by mouth 2 (two) times daily.   No current facility-administered medications for this visit. (Other)      REVIEW OF SYSTEMS: ROS    Positive for: Musculoskeletal, Cardiovascular, Eyes, Respiratory   Negative for: Constitutional, Gastrointestinal, Neurological, Skin, Genitourinary, HENT,  Endocrine, Psychiatric, Allergic/Imm, Heme/Lymph   Last edited by Theodore Demark, COA on 04/11/2020  7:49 AM. (History)       ALLERGIES Allergies  Allergen Reactions  . Keflex [Cephalexin] Other (See Comments)    Unknown   . Zocor [Simvastatin] Other (See Comments)    Unknown   . Lipitor [Atorvastatin] Other (See Comments)    Elevated blood sugar  . Sulfa Antibiotics Nausea Only    PAST MEDICAL HISTORY Past Medical History:  Diagnosis Date  . Asthma   . 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 CAD, multiple vessel 1998   Referred for CABG  . Carotid bruit present 01/2012   a. asymptomatic; carotid doppler 01/2012 - R bulb/prox ICA mild-mod amt of fibrous plaque, L subclavian 70-99% diameter reduction, L bulb/ICA mild amt of fibrous plaque  . Chronic low back pain   . Dyslipidemia, goal LDL below 70   . Hypertension, essential   . Hypertensive retinopathy    OU  . Macular degeneration    Dry OD, Wet OS  . Non-ST elevated myocardial infarction (non-STEMI) ([place]) 01/2012   SVG-diagonal occluded, 50% distal left main, 70-80% proximal LAD and 90% mid. Patent LIMA  with 70% lesion post anastomosis. SVG to RCA occluded, SVG-OM2-3 90% sequential limb stenosis -- PCI SVG-OM 2 -OM 3: Xience Xpedition DES 2.75 MM x15 MM (3 MM)   . S/P CABG x 5 1998   LIMA-LAD, SVG-OM2-OM3; SVG-rPDA, SVG-D1  . S/P cardiac catheterization 06/28/2013   Cres Angina: a) 60% dLM; 70% Ostial LAD; 90% Cx - no OMs seen; 100% RCA;; SVG-D1 & SVG--dRCA occluded; Patent LIMA-LAD with dLAD ~70-80%;; SVG-OM2-OM3: 95% proximal, 50% mid, Diffuse ISR in BMS-OM2 & DES in Seq limb-OM3;; b) PCI:  prox SVG-OM-OM - Resolute DES 3.0 mm x 26 mm (3.3 mm); PTCA (Angiosculpt) to ISR in Seq limb - ~3.0 mm.; c)  Myoview 08/25/13 - small non-T-mural Inf  scar, No Ischemia  . Stented coronary artery 2003; 01/2012; 05/2013   a) '03 BMS SVG-OM2-OM3 (initial limb into OM2);; b) 2013 - DES to Seq Lmg SVG-OM2-OM3 - Xience DES;; c) 05/2013 Prox SVG--OM2-OM3 Resolute DES 3.0 mm x 26 mm; Angiosculpt PTCA of ISR in Seq Limb (3.0 mm)   Past Surgical History:  Procedure Laterality Date  . CATARACT EXTRACTION Bilateral    Dr. Dolores Lory  . CATARACT EXTRACTION W/ INTRAOCULAR LENS  IMPLANT, BILATERAL Bilateral   . CORONARY ANGIOPLASTY WITH STENT PLACEMENT  2003   BMS 3.0 mm x 8 mm stent to SVG to OM & cuttin balloon to diagonal; 100% occluded vein graft to PDA, patent LIMA; 80% stenosis in SVG-diagnal; vein graft stenosis in OM  . CORONARY ARTERY BYPASS GRAFT  1998   SVG-diagonal, DVG-OM, DVG-PDA, LIMA-LAD  . ESOPHAGOGASTRODUODENOSCOPY N/A 06/29/2013   Procedure: ESOPHAGOGASTRODUODENOSCOPY (EGD);  Surgeon: Beryle Beams, MD;  Location: [facility] ENDOSCOPY;  Service: Endoscopy;  Laterality: N/A;  . EYE SURGERY Bilateral    Cat Sx - Dr. Dolores Lory  . LEFT HEART CATHETERIZATION WITH CORONARY/GRAFT ANGIOGRAM N/A 01/13/2012   Procedure: LEFT HEART CATHETERIZATION WITH Beatrix Fetters;  Surgeon: Lorretta Harp, MD;  Location[facility] C[facility] LAB;  Service: Cardiovascular: PCI & stenting of SVG-OM2 to OM3 segment with Xience Xpedition 2.23mmx15mm (3.44mm) DES  . LEFT HEART CATHETERIZATION WITH CORONARY/GRAFT ANGIOGRAM N/A 06/28/2013   Procedure: LEFT HEART CATHETERIZATION WITH Beatrix Fetters;  Surgeon: Troy Sine, MD;  Location: [facility] CATH LAB;  Service: Cardiovascular: Patent LIMA-LAD (progression of distal LAD ~80%, SVG-Diag, SVG-RCA 100%. PCI to SVG-CxOM2-OM3 99% prox lesion (Resolute DES 3.0 x 26 - 3.4 mm) & PTCA of  95% ISR in sequential limb (3.0 mm Angiosculpt)   . NM MYOVIEW LTD  07/2013   Test looked good!! Evidence of possible old MI / scar, but nothing to suggest ischemia. Not Gated b/c ectopy .  [name] NM MYOVIEW LTD  06/2017   Intermediate risk because of reduced EF  but stable.  Stable fixed inferior defect consistent with either diaphragmatic attenuation or inferior infarct.  Septal hypokinesis/dyskinesis due to LBBB and prior CABG.  EF is 46%.  Stable compared to 2015 following PCI  . TONSILLECTOMY      FAMILY HISTORY Family History  Problem Relation Age of Onset  . Cancer Mother   . Heart attack Father     SOCIAL HISTORY Social History   Tobacco Use  . Smoking status: Former Smoker    Packs/day: 2.00    Years: 30.00    Pack years: 60.00    Types: Cigarettes    Quit date: 03/02/1990    Years since quitting: 30.1  . Smokeless tobacco: Never Used  Substance Use Topics  . Alcohol use: Yes    Comment:  06/28/2013 "aien't drank in years; used to drink a little beer"  . Drug use: No         OPHTHALMIC EXAM:  Base Eye Exam    Visual Acuity (Snellen - Linear)      Right Left   Dist cc CF at 3' 20/40 +2   Dist ph cc NI 20/30 -2   Correction: Glasses       Tonometry (Tonopen, 7:46 AM)      Right Left   Pressure 20 15       Pupils      Dark Light Shape React APD   Right 3 2 Round Brisk None   Left 3 2 Round Brisk None       Visual Fields (Counting fingers)      Left Right    Full Full       Extraocular Movement      Right Left    Full Full       Neuro/Psych    Oriented x3: Yes   Mood/Affect: Normal       Dilation    Both eyes: 1.0% Mydriacyl, 2.5% Phenylephrine @ 7:46 AM        Slit Lamp and Fundus Exam    Slit Lamp Exam      Right Left   Lids/Lashes Dermatochalasis - upper lid Dermatochalasis - upper lid   Conjunctiva/Sclera White and quiet White and quiet   Cornea 1+ Punctate epithelial erosions, Arcus, 1+endopigment 2-3+ Punctate epithelial erosions, Arcus, 1+endopigment, irregular epi, decreased TBUT   Anterior Chamber deep, 0.5+pigment, mild pigmented vitreous prolapse temporal pupil Deep and quiet   Iris Round and dilated Round and dilated   Lens 3 piece PC IOL displaced inferiorly 3 piece PC IOL in good  position   Vitreous Vitreous syneresis, Posterior vitreous detachment, vitreous condensations Vitreous syneresis, Posterior vitreous detachment       Fundus Exam      Right Left   Disc mild Pallor, Sharp rim mild Pallor, Sharp rim, +PPA   C/D Ratio 0.3 0.3   Macula Flat, Blunted foveal reflex, Drusen, RPE mottling, clumping and atrophy -- prominent central pigment clumping Flat, Blunted foveal reflex, Drusen, cystic changes --  stably improved; RPE mottling, clumping and atrophy, +GA surrounding fovea and extending to disc, no heme   Vessels Mild Vascular attenuation, mild tortuousity Mild Vascular attenuation   Periphery Attached, reticular degeneration, No heme  Attached, reticular degeneration, flat, round pigmented nevus at 1200 (~1.5DD)        Refraction    Wearing Rx      Sphere Cylinder Axis Add   Right +2.25 +1.50 008 +0.50   Left +0.00 +2.00 162 +2.75          IMAGING AND PROCEDURES  Imaging and Procedures for @TODAY @  OCT, Retina - OU - Both Eyes       Right Eye Quality was good. Central Foveal Thickness: 217. Progression has been stable. Findings include abnormal foveal contour, outer retinal atrophy, subretinal hyper-reflective material, pigment epithelial detachment, retinal drusen , no SRF, no IRF (stable improvement in IRF/cystic changes).   Left Eye Quality was good. Central Foveal Thickness: 231. Progression has been stable. Findings include normal foveal contour, no SRF, outer retinal atrophy, retinal drusen , pigment epithelial detachment, subretinal hyper-reflective material, no IRF (stable improvement in IRF; persistent diffuse ORA, partial PVD).   Notes *Images captured and stored on drive  Diagnosis / Impression:  OD: Non-exu ARMD with stable improvement in IRF/cystic changes  OS: exudative ARMD w/ stable improvement in IRF; persistent diffuse ORA  Clinical management:  See below  Abbreviations: NFP - Normal foveal profile. CME - cystoid macular  edema. PED - pigment epithelial detachment. IRF - intraretinal fluid. SRF - subretinal fluid. EZ - ellipsoid zone. ERM - epiretinal membrane. ORA - outer retinal atrophy. ORT - outer retinal tubulation. SRHM - subretinal hyper-reflective material        Intravitreal Injection, Pharmacologic Agent - OS - Left Eye       Time Out 04/11/2020. 7:58 AM. Confirmed correct patient, procedure, site, and patient consented.   Anesthesia Topical anesthesia was used. Anesthetic medications included Lidocaine 2%, Proparacaine 0.5%.   Procedure Preparation included 5% betadine to ocular surface, eyelid speculum. A (32g) needle was used.   Injection:  1.25 mg Bevacizumab (AVASTIN) 1.25mg /0.72mL SOLN   NDC: A7182017, Lot: [id number] , Expiration date: 05/08/2020   Route: Intravitreal, Site: Left Eye, Waste: 0 mL  Post-op Post injection exam found visual acuity of at least counting fingers. The patient tolerated the procedure well. There were no complications. The patient received written and verbal post procedure care education. Post injection medications were not given.                 ASSESSMENT/PLAN:    ICD-10-CM   1. Exudative age-related macular degeneration of left eye with active choroidal neovascularization (HCC)  H35.3221 Intravitreal Injection, Pharmacologic Agent - OS - Left Eye    Bevacizumab (AVASTIN) SOLN 1.25 mg  2. Retinal edema  H35.81 OCT, Retina - OU - Both Eyes  3. 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 Essential hypertension  I10   5. Hypertensive retinopathy of both eyes  H35.033   6. Pseudophakia of both eyes  Z96.1   7. Dislocation of intraocular lens, sequela  T85.22XS   8. Vitreous prolapse of right eye  H43.01   9. 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 Exudative age related macular degeneration OS  - OS with interval conversion to exu ARMD noted on 04.26.21  - s/p IVA OS  #1 (04.26.21), #2 (05.26.21), #3 (06.24.21), #4 (08.05.21), #5 (09.16.21), #6 (10.28.21), #7 (12.16.21)  - BCVA 20/40 from 20/30  - OCT shows stable improvement in IRF at 7 wks  - recommend IVA OS #8 today, 02.10.22, maintenance, inc w/ interval to 9 weeks  - pt wishes to proceed with IVA  - RBA of procedure discussed, questions answered  - informed consent obtained and signed  - see procedure note   - Avastin informed consent form signed and scanned on 04.26.2021  - f/u 9 wks, DFE, OCT, possible injection - tx and ext slowly as able (functionally monocular)  3. Nonexudative ARMD OD  - OD advanced stage with foveal involvement -- BCVA CF 3' from geographic atrophy Age related macular degeneration, non-exudative, both eyes  - The incidence, anatomy, and pathology of dry AMD, risk of progression, and the AREDS and AREDS 2 study including smoking risks discussed with patient.  - Recommend amsler grid monitoring   - f/u in 9 weeks  4,5. Hypertensive retinopathy OU  - discussed importance of tight BP control  - monitor  6-8. Pseudophakia OU w/ dislocated IOL and vitreous prolapse OD  - s/p CE/IOL OU (Dr. 05.09.2021)  - OS: 3-piece IOL in good position   - OD: 3-piece IOL dislocated inferiorly with mild vitreous prolapse temporal pupil -- IOL relatively stable  -  OD with vision limited by advanced ARMD -- no intervention recommended at this time  - recommend monitoring for now -- may need PPV / IOL exchange if IOL dislocates completely   9. Choroidal nevus OS  - flat, pigmented round lesion at 12 oclock periphery  - 1.5DD in size; no SRF or orange pigment  - monitor    Ophthalmic Meds Ordered this visit:  Meds ordered this encounter  Medications  . Bevacizumab (AVASTIN) SOLN 1.25 mg      Return in about 9 weeks (around 06/13/2020) for f/u exu ARMD OS, DFE, OCT.  There are no Patient Instructions on file for this visit.   This document serves as a record of services personally  performed by Gardiner Sleeper, MD, PhD. It was created on their behalf by Leonie Douglas, an ophthalmic technician. The creation of this record is the provider's dictation and/or activities during the visit.    Electronically signed by: Leonie Douglas COA, 04/11/20  9:33 AM   This document serves as a record of services personally performed by Gardiner Sleeper, MD, PhD. It was created on their behalf by San Jetty. Owens Shark, OA an ophthalmic technician. The creation of this record is the provider's dictation and/or activities during the visit.    Electronically signed by: San Jetty. Owens Shark, [place] 02.10.2022 9:33 AM  Gardiner Sleeper, M.D., Ph.D. Diseases & Surgery of the Retina and Vitreous [facility]  I have reviewed the above documentation for accuracy and completeness, and I agree with the above. Gardiner Sleeper, M.D., Ph.D. 04/11/20 9:33 AM  Abbreviations: M myopia (nearsighted); A astigmatism; H hyperopia (farsighted); P presbyopia; Mrx spectacle prescription;  CTL contact lenses; OD right eye; OS left eye; OU both eyes  XT exotropia; ET esotropia; PEK punctate epithelial keratitis; PEE punctate epithelial erosions; DES dry eye syndrome; MGD meibomian gland dysfunction; ATs artificial tears; PFAT's preservative free artificial tears; [place] nuclear sclerotic cataract; PSC posterior subcapsular cataract; ERM epi-retinal membrane; PVD posterior vitreous detachment; RD retinal detachment; DM diabetes mellitus; DR diabetic retinopathy; NPDR non-proliferative diabetic retinopathy; PDR proliferative diabetic retinopathy; CSME clinically significant macular edema; DME diabetic macular edema; dbh dot blot hemorrhages; CWS cotton wool spot; POAG primary open angle glaucoma; C/D cup-to-disc ratio; HVF humphrey visual field; GVF goldmann visual field; OCT optical coherence tomography; IOP intraocular pressure; BRVO Branch retinal vein occlusion; CRVO central retinal vein occlusion; CRAO central retinal  artery occlusion; BRAO branch retinal artery occlusion; RT retinal tear; SB scleral buckle; PPV pars plana vitrectomy; VH Vitreous hemorrhage; PRP panretinal laser photocoagulation; IVK intravitreal kenalog; VMT vitreomacular traction; MH Macular hole;  NVD neovascularization of the disc; NVE neovascularization elsewhere; AREDS age related eye disease study; ARMD age related macular degeneration; POAG primary open angle glaucoma; EBMD epithelial/anterior basement membrane dystrophy; ACIOL anterior chamber intraocular lens; IOL intraocular lens; PCIOL posterior chamber intraocular lens; Phaco/IOL phacoemulsification with intraocular lens placement; [place] photorefractive keratectomy; LASIK laser assisted in situ keratomileusis; HTN hypertension; DM diabetes mellitus; COPD chronic obstructive pulmonary disease

## 2020-04-11 ENCOUNTER — Other Ambulatory Visit: Payer: Self-pay

## 2020-04-11 ENCOUNTER — Encounter (INDEPENDENT_AMBULATORY_CARE_PROVIDER_SITE_OTHER): Payer: Self-pay | Admitting: Ophthalmology

## 2020-04-11 ENCOUNTER — Ambulatory Visit (INDEPENDENT_AMBULATORY_CARE_PROVIDER_SITE_OTHER): Payer: Medicare Other | Admitting: Ophthalmology

## 2020-04-11 DIAGNOSIS — D3132 Benign neoplasm of left choroid: Secondary | ICD-10-CM

## 2020-04-11 DIAGNOSIS — H4301 Vitreous prolapse, right eye: Secondary | ICD-10-CM

## 2020-04-11 DIAGNOSIS — I10 Essential (primary) hypertension: Secondary | ICD-10-CM

## 2020-04-11 DIAGNOSIS — H353114 Nonexudative age-related macular degeneration, right eye, advanced atrophic with subfoveal involvement: Secondary | ICD-10-CM | POA: Diagnosis not present

## 2020-04-11 DIAGNOSIS — T8522XS Displacement of intraocular lens, sequela: Secondary | ICD-10-CM

## 2020-04-11 DIAGNOSIS — H3581 Retinal edema: Secondary | ICD-10-CM

## 2020-04-11 DIAGNOSIS — H353221 Exudative age-related macular degeneration, left eye, with active choroidal neovascularization: Secondary | ICD-10-CM | POA: Diagnosis not present

## 2020-04-11 DIAGNOSIS — Z961 Presence of intraocular lens: Secondary | ICD-10-CM

## 2020-04-11 DIAGNOSIS — H35033 Hypertensive retinopathy, bilateral: Secondary | ICD-10-CM

## 2020-04-11 MED ORDER — BEVACIZUMAB CHEMO INJECTION 1.25MG/0.05ML SYRINGE FOR KALEIDOSCOPE
1.2500 mg | INTRAVITREAL | Status: AC | PRN
  Administered 2020-04-11: 1.25 mg via INTRAVITREAL

## 2020-04-21 ENCOUNTER — Other Ambulatory Visit: Payer: Self-pay | Admitting: Cardiology

## 2020-04-27 ENCOUNTER — Other Ambulatory Visit: Payer: Self-pay | Admitting: Cardiology

## 2020-05-23 ENCOUNTER — Other Ambulatory Visit: Payer: Self-pay

## 2020-05-23 ENCOUNTER — Encounter: Payer: Self-pay | Admitting: Cardiology

## 2020-05-23 ENCOUNTER — Ambulatory Visit (INDEPENDENT_AMBULATORY_CARE_PROVIDER_SITE_OTHER): Payer: Medicare Other | Admitting: Cardiology

## 2020-05-23 VITALS — BP 140/74 | HR 68 | Ht 70.0 in | Wt 169.8 lb

## 2020-05-23 DIAGNOSIS — I214 Non-ST elevation (NSTEMI) myocardial infarction: Secondary | ICD-10-CM | POA: Diagnosis not present

## 2020-05-23 DIAGNOSIS — Z955 Presence of coronary angioplasty implant and graft: Secondary | ICD-10-CM | POA: Diagnosis not present

## 2020-05-23 DIAGNOSIS — R7301 Impaired fasting glucose: Secondary | ICD-10-CM

## 2020-05-23 DIAGNOSIS — I2510 Atherosclerotic heart disease of native coronary artery without angina pectoris: Secondary | ICD-10-CM | POA: Diagnosis not present

## 2020-05-23 DIAGNOSIS — I10 Essential (primary) hypertension: Secondary | ICD-10-CM

## 2020-05-23 DIAGNOSIS — E785 Hyperlipidemia, unspecified: Secondary | ICD-10-CM

## 2020-05-23 DIAGNOSIS — I25709 Atherosclerosis of coronary artery bypass graft(s), unspecified, with unspecified angina pectoris: Secondary | ICD-10-CM | POA: Diagnosis not present

## 2020-05-23 DIAGNOSIS — I208 Other forms of angina pectoris: Secondary | ICD-10-CM

## 2020-05-23 NOTE — Progress Notes (Signed)
Primary Care Provider: No primary care provider on file. Cardiologist: Glenetta Hew, MD Electrophysiologist: None  Clinic Note: Chief Complaint  Patient presents with  . Follow-up    Annual-no major changes  . Coronary Artery Disease    No angina or heart failure  ===================================  ASSESSMENT/PLAN   Problem List Items Addressed This Visit    CAD, multiple vessel;  CABG in 1998; CTO SVG-RCA & SVG-Diag, multiple PCI SVG-OM, patent LIMA-LAD with dLAD 80% (Chronic)   Relevant Orders   EKG 12-Lead (Completed)   Lipid panel (Completed)   Comprehensive metabolic panel (Completed)   CBC (Completed)   TSH (Completed)   Hemoglobin A1c (Completed)   Coronary artery disease involving coronary bypass graft of native heart with angina pectoris (HCC) - Primary (Chronic)    Mild stable anginal symptoms.  No active concerns.  He is on stable regimen of beta-blocker, ACE) calcium blocker along with Imdur.  He is on statin which we are converting to a more potent statin.  He remains on maintenance dose Brilinta because he essentially is living to grasp 1 of which has 3 stents.  Okay to hold Brilinta 5 days preop for surgery procedures.       Presence of drug coated stent in SVG-OM (Chronic)    On maintenance dose Brilinta 60 mg twice daily without aspirin.  Tolerating well.  He has extensive stent work in the SVG-OM system.  This includes overlapping BMS and DES that have already had issues with ISR.    Plan: Continue Brilinta for now as [place] as he has had no issues.  Okay to hold Brilinta 5 days preop for surgery procedure.        Relevant Orders   EKG 12-Lead (Completed)   Stable angina (HCC) (Chronic)    Not sure if he truly colic exertional dyspnea an anginal equivalent, but he does have a stable exertional dyspnea that has been that way since have known him.  No active angina type chest discomfort although he never recalls having chest discomfort.  Consider  maybe mild microvascular disease based on extensive native CAD.  Plan: Continue with atenolol and Imdur along with the amlodipine portion of Lotrel. Rarely has used sublingual NTG..      History of: NSTEMI (non-ST elevated myocardial infarction) (Chronic)    He has at least 1 non-STEMI and then several unstable angina presentations.  He has an area of inferior perfusion defect on his Myoview stress test previously read his diaphragmatic attenuation which is probably related to the occluded RCA system (both native vessel and graft).  The lesion is fixed with no evidence of ischemia. He does have multiple years EF but no true active heart failure symptoms from this.       Essential hypertension (Chronic)    He has had significant issues with orthostatic hypotension in the past, we have reduced his blood pressure medications.  I am allowing for more permissive hypertension although the pressures today were little higher than I would like.  Initial check was 150/74 on recheck with more consistent with 140/70.  Given his advanced age, I think constipation makes sense.  He is already on essentially 3 medications including combination amlodipine-benazepril and atenolol along with Imdur.  Would not be more aggressive.  In fact he wanted to stay adequately hydrated.  We are also trying to avoid using diuretics for his edema.      Relevant Orders   Lipid panel (Completed)   Comprehensive metabolic panel (Completed)  CBC (Completed)   TSH (Completed)   Hemoglobin A1c (Completed)   Dyslipidemia, goal LDL below 70 (Chronic)    Pravastatin is what he seems to be able to tolerate.  We checked his labs today.  Previously been well controlled.  Today they are not as well controlled.  I would like to try to change from pravastatin 40 mg to rosuvastatin 20 mg to see if he tolerates. If not, we can try adding Zetia 10 mg daily. In other words, I do not want him to get rid of the pravastatin yet., in case  he does not tolerate rosuvastatin.  Plan: Replace pravastatin with rosuvastatin 20 mg p.o. daily. Dispense 30 tabs, 3 refills. If he tolerates, then will change to 90 tabs.  Recheck lipids in 4 months      Relevant Orders   Lipid panel (Completed)   Comprehensive metabolic panel (Completed)   CBC (Completed)   TSH (Completed)   Hemoglobin A1c (Completed)    Other Visit Diagnoses    Impaired fasting glucose       Relevant Orders   Lipid panel (Completed)   Comprehensive metabolic panel (Completed)   CBC (Completed)   TSH (Completed)   Hemoglobin A1c (Completed)      ===================================  HPI:    JOSEHUA HAMMAR is a 80 y.o. male with a cardiac history noted below who presents today for annual f/u.  No major complaints.    1998: - Cath- MV CAD-> CABGx5 (LIMA-LAD, SVG-OM1-OM2; SVG-rPDA, SVG-D1)  2003, Abn Cardiolite:->PCI-SVG-OM2-OM3 (prox limb): 3.0 x 8 Zeta BMS- going into native OM2);; PTCA only native D1 through SVG; CTO SVG-RCA;patent LIMA  01/2012 - NSTEMI: SVG-RCA &SVG-Diag CTO. Severe Seq limb of SeqSVG-OM1->OM2  =>DES PCI (Xience 2.75 x 15 - 59mm -> stenosis just above prior bare-metal stent)  05/2013- UNSTABLE ANGINA: CardiacCATH:dLM 60%, ostial LAD 70% with flow to D1 and D2.  Extensive Dz after D2 with to-fro flow from LIMA.  Patent LIMA-LADw/progression ofdLADto ~80%. PCI to SVG-CxOM1-OM2 99% prox lesion (Resolute Onyx DES 2 x 26- 3.3 mm) &PTCA of 95% ISR in sequential limb..   08/24/13: Myoview Stress Testpost PCIlooked good!! Evidence of possible old MI / scar, but nothing to suggest ischemia. Not Gated b/c ectopy . -- No real change in f/u Myoview 06/2017   Gerrit Rafalski Bushey was last seen on May 18, 2019: He was doing fairly well.  Has his usual baseline exertional dyspnea.  But denying any chest pain.  No heart failure symptoms.  Mild end of day edema but right swelling does go down at night.  Some positional dizziness but better  since he back off medications.  Arthritis pains breast activity, especially in colder weather.  Recent Hospitalizations: None  Reviewed  CV studies:    The following studies were reviewed today: (if available, images/films reviewed: From Epic Chart or Care Everywhere) . None:  Interval History:   Maceo Hernan Engelmann returns here today for annual follow-up overall doing fairly well.  He notes that he still has his baseline exertional dyspnea with increased physical activity, but not with routine walking.  He has occasional "pulling sensations in his chest every now and then but nothing significant enough to worry him.  He denies any PND orthopnea, she notes 2 or 3 times a night nocturia.  He has nighttime leg cramps usually says they get better with rubbing them down.  Overall seems relatively stable.  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  CV Review of Symptoms (Summary) Cardiovascular ROS: positive for - dyspnea on exertion, edema and Musculoskeletal sounding chest discomfort, leg cramps but no claudication negative for - irregular heartbeat, orthopnea, palpitations, paroxysmal nocturnal dyspnea, rapid heart rate, shortness of breath or Lightheadedness, dizziness or syncope/near syncope, TIA/amaurosis fugax, claudication  CV Review of Symptoms (Summary): positive for - edema and Shortness of breath with more strenuous exertion-but more limited by arthritis pains negative for - chest pain, irregular heartbeat, orthopnea, palpitations, paroxysmal nocturnal dyspnea, rapid heart rate, shortness of breath or Syncope/near syncope, TIA/amaurosis fugax, claudication   The patient does not have symptoms concerning for COVID-19 infection (fever, chills, cough, or new shortness of breath).   REVIEWED OF SYSTEMS   ROS   Constitutional: Negative for malaise/fatigue (No energy to do yard work  -- but [employer] walks back & forth in the house).  HENT: Negative for congestion and nosebleeds.  -- EASY BRUISING.  Respiratory: Negative for shortness of breath.   Cardiovascular: Positive for leg swelling - goes down @ night . Negative for claudication.  Gastrointestinal: Negative for blood in stool and melena.  Genitourinary: Negative for hematuria.  Musculoskeletal: Positive for mild LBP  Neurological: Positive for dizziness (Less prominent orthostatic dizzines with splitting up meds .). Negative for speech change, focal weakness and headaches.  Psychiatric/Behavioral: Negative for depression and memory loss (Not that he acknowledges, but may be some mild based on history taking.). The patient   I have reviewed and (if needed) personally updated the patient's problem list, medications, allergies, past medical and surgical history, social and family history.   PAST MEDICAL HISTORY   Past Medical History:  Diagnosis Date  . Asthma   . CAD (coronary artery disease) of bypass graft 2003   (now known CTO of SVG-rPDA & SVG-D1 & 3 stents in SeqSVg-Om1-OM2 (1 in prox leg, overlapping DES-BMS from OM1-OM2 into OM2 - s/p PTCA for ISR).; LIMA-LAD patent   . CAD S/P PTCA-PCI of SeqSVG-OM1-OM2 2003; 01/2012; 05/2013   a) '03 SeqSVG-OM1-OM2,  Zeta BMS 3.0x8 Seq limb into OM2), & PTCA of SVg-D1 anastomosis; b) 2013: Distal Limb of SeqSVG-OM1-OM2 from OM1-2 overlap BMS ->Xience DES 2.75 x 15 - 10mm;; c) 05/2013 Proxlimb SVG--OM1-OM2 -> Resolute DES 3 x 26 - 3.39mm; Angiosculpt PTCA of ISR in Seq Limb overlaping stents (3.25 mm)  . CAD, multiple vessel - Native CAD. 1998; 2015   a) Referred for CABGx5 ; by Cath 05/2013: dLM 60%, Ost LAD 70&. Prox -mid LAD extensive 70%( to-fro flow); D1 80%; OM1 & OM2 (CTO), follow-on LCx beyond AVG 90+% with small OM3/OM4; RCA TO (R-R collaterals);  [name] Carotid bruit present 01/2012   a. asymptomatic; carotid doppler 01/2012 - R bulb/prox ICA mild-mod amt of fibrous plaque, L  subclavian 70-99% diameter reduction, L bulb/ICA mild amt of fibrous plaque  . Chronic low back pain   . Dyslipidemia, goal LDL below 70   . Hypertension, essential   . Hypertensive retinopathy    OU  . Macular degeneration    Dry OD, Wet OS  . Non-ST elevated myocardial infarction (non-STEMI) / Crescendo Angina 01/2012   Cath noted 90% stenosis from OM1-OM2 in Seq Limb of SVG-OM1-OM2 --> DES PCI overlaps prior BMS. Also noted CTO of SVG-Diag; b) CRESCENDO ANGINA: 05/2013 - prox limb SVG-OM1-OM2 DES PCI & PTCA of 95% ISR in distal Limb overlapping DES-BMS; Myoview 08/25/13 -  small non-T-mural Inf scar, No Ischemia  . S/P CABG x 5 1998   LIMA-LAD, SeqSVG-OM1-OM2; SVG-rPDA, SVG-D1     PAST SURGICAL HISTORY   Past Surgical History:  Procedure Laterality Date  . CATARACT EXTRACTION Bilateral    Dr. Dolores Lory  . CATARACT EXTRACTION W/ INTRAOCULAR LENS  IMPLANT, BILATERAL Bilateral   . CORONARY ANGIOPLASTY WITH STENT PLACEMENT  2003   DR LITTLE: BMS 3.0 mm x 8 mm stent to Seq limb anastomosis of SeqSVG-OM1-OM2 into OM2; & cutting balloon SVG-Diag  . CORONARY ANGIOPLASTY WITH STENT PLACEMENT  06/28/2013   Procedure: CORONARY ANGIOPLASTY WITH STENT PLACEMENT ;  Surgeon: Troy Sine, MD;  Location: [facility] CATH LAB;  Service: Cardiovascular: PCI to SVG-CxOM1-OM2 99% prox lesion (Resolute DES 3.0 x 26 - 3.4 mm) & PTCA of  95% ISR in sequential limb (3.0 mm Angiosculpt)   . CORONARY ARTERY BYPASS GRAFT  1998   SVG-diagonal, DVG-OM, DVG-PDA, LIMA-LAD  . CORONARY STENT INTERVENTION  01/13/2012     Procedure: CORONARY STENT INTERVENTION ;  Surgeon: Lorretta Harp, MD;  Location: [facility] CATH LAB;  Service: Cardiovascular: PCI Seq limb of Seq SVG-OM1-OM2 90% -> 0% - Xience Xpedition DES 2.75 MM x15 MM (3 MM) overlaps prox edge of BMS into OM2  . ESOPHAGOGASTRODUODENOSCOPY N/A 06/29/2013   Procedure: ESOPHAGOGASTRODUODENOSCOPY (EGD);  Surgeon: Beryle Beams, MD;  Location[facility] ENDOSCOPY;  Service: Endoscopy;   Laterality: N/A;  . EYE SURGERY Bilateral    Cat Sx - Dr. Dolores Lory  . LEFT HEART CATH AND CORS/GRAFTS ANGIOGRAPHY  2003   ABN CARDIOLITE:  100% SVG-PDA, patent LIMA; 80% anastomotic lesion SVG-Diag; 99% anastomotic lesion of Seq limb SVG-OM1-OM2; Native LM OK - 50% & 80% prox LAD, 80% D1; Native OM1 & OM2 TO; Native RCA CTO (R_R collaterals). => PCI of SVG-OM2 & PTCA of SVG-Diag  . LEFT HEART CATHETERIZATION WITH CORONARY/GRAFT ANGIOGRAM N/A 01/13/2012   Procedure: LEFT HEART CATHETERIZATION WITH Beatrix Fetters;  Surgeon: Lorretta Harp, MD;  Location: [facility] CATH LAB;  Service: Cardiovascular: SVG-D1 CTO (new), knosn SVG-RCA CTO.  Native mRCA CTO (R-R collaterals); 50% distal LM, 70-80% prox LAD and 90% mid.  LIMA-LAD patent w/  70% post anastomosis. OM1 &OM2 CTO. Seq limb of SeqSVG-OM1-OM2 90% @ OM1 to BMS into OM2.  [name] LEFT HEART CATHETERIZATION WITH CORONARY/GRAFT ANGIOGRAM N/A 06/28/2013   Procedure: LEFT HEART CATHETERIZATION WITH Beatrix Fetters;  Surgeon: Troy Sine, MD;  Location: [facility] CATH LAB;  Service: Cardiovascular: Cres Angina: a) 60% dLM; 70% Ostial LAD; 90% Cx - no OMs seen; 100% RCA;; SVG-D1 & SVG--dRCA occluded; Patent LIMA-LAD with dLAD ~70-80%;; SeqSVG-OM1-OM2: 95% proximal limb, 50% mid & Diffuse ISR in overlapping DES/BMS in Seq Limb OM-OM2  . NM MYOVIEW LTD  07/2013   Test looked good!! Evidence of possible old MI / scar, but nothing to suggest ischemia. Not Gated b/c ectopy .  [name] NM MYOVIEW LTD  06/2017   Intermediate risk because of reduced EF but stable.  Stable fixed inferior defect consistent with either diaphragmatic attenuation or inferior infarct.  Septal hypokinesis/dyskinesis due to LBBB and prior CABG.  EF is 46%.  Stable compared to 2015 following PCI  . TONSILLECTOMY      Immunization History  Administered Date(s) Administered  . Influenza Split 01/15/2012, 12/03/2012  . PFIZER(Purple Top)SARS-COV-2 Vaccination 05/04/2019, 05/25/2019     MEDICATIONS/ALLERGIES   Current Meds  Medication Sig  . amLODipine-benazepril (LOTREL) 10-20 MG capsule Take 1 capsule by mouth daily.  [name]  atenolol (TENORMIN) 50 MG tablet Take 1 tablet by mouth once daily  . isosorbide mononitrate (IMDUR) 60 MG 24 hr tablet TAKE 1 TABLET BY MOUTH ONCE DAILY. PLEASE SCHEDULE AN APPT FOR FUTURE REFILLS.  [name] nitroGLYCERIN (NITROSTAT) 0.4 MG SL tablet Place 1 tablet (0.4 mg total) under the tongue every 5 (five) minutes as needed (up to 3 doses).  . ticagrelor (BRILINTA) 60 MG TABS tablet Take 1 tablet (60 mg total) by mouth 2 (two) times daily.  . [DISCONTINUED] pravastatin (PRAVACHOL) 40 MG tablet Take 1 tablet by mouth once daily    Allergies  Allergen Reactions  . Keflex [Cephalexin] Other (See Comments)    Unknown   . Zocor [Simvastatin] Other (See Comments)    Unknown   . Lipitor [Atorvastatin] Other (See Comments)    Elevated blood sugar  . Sulfa Antibiotics Nausea Only    SOCIAL HISTORY/FAMILY HISTORY   Reviewed in Epic:  Pertinent findings:  Social History   Tobacco Use  . Smoking status: Former Smoker    Packs/day: 2.00    Years: 30.00    Pack years: 60.00    Types: Cigarettes    Quit date: 03/02/1990    Years since quitting: 30.2  . Smokeless tobacco: Never Used  Substance Use Topics  . Alcohol use: Yes    Comment: 06/28/2013 "aien't drank in years; used to drink a little beer"  . Drug use: No   Social History   Social History Narrative   Retired Married father of 2, grandfather tube.  Exercises routinely walking a roughly an hour a day.  Does not smoke does not drink.  He quit smoking many years ago.    OBJCTIVE -PE, EKG, labs   Wt Readings from Last 3 Encounters:  05/23/20 169 lb 12.8 oz (77 kg)  05/18/19 170 lb (77.1 kg)  11/18/18 168 lb 6.4 oz (76.4 kg)    Physical Exam: BP 140/74   Pulse 68   Ht 5\' 10"  (1.778 m)   Wt 169 lb 12.8 oz (77 kg)   SpO2 95%   BMI 24.36 kg/m  Physical Exam   appears  well-developed and well-nourished. No distress.  Relatively healthy-appearing.  Well-groomed.  HENT:  Head: Normocephalic and atraumatic.  Very hard of hearing  Neck: No JVD present.  Cardiovascular: Normal rate, regular rhythm and S1 normal.  Occasional extrasystoles are present. PMI is not displaced. Exam reveals decreased pulses (Mildly diminished pedal pulses). Exam reveals no gallop and no friction rub. ++ Ectopy No murmur heard. Borderline split S2.  Pulmonary/Chest: Effort normal and breath sounds normal. No respiratory distress. He has no wheezes. He has no rales.  Abdominal: Soft. Bowel sounds are normal. He exhibits no distension. There is no abdominal tenderness. There is no rebound.  No HSM  Musculoskeletal:        General: Edema (trace+ left, 1+ right LE/ankle   Adult ECG Report  Rate: 68 ;  Rhythm: normal sinus rhythm, premature ventricular contractions (PVC) and 1  AVB, nonspecific IVCD.  Septal MI, age-indeterminate.  Otherwise normal axis, intervals and durations.;   Narrative Interpretation: Stable  Recent Labs: Labs drawn today.  Reviewed. Lab Results  Component Value Date   CHOL 149 05/23/2020   HDL 45 05/23/2020   LDLCALC 85 05/23/2020   TRIG 102 05/23/2020   CHOLHDL 3.3 05/23/2020   Lab Results  Component Value Date   CREATININE 1.06 05/23/2020   BUN 14 05/23/2020   NA 141 05/23/2020   K 4.9 05/23/2020  CL 103 05/23/2020   CO2 24 05/23/2020   CBC Latest Ref Rng & Units 05/23/2020 05/23/2019 06/30/2013  WBC 3.4 - 10.8 x10E3/uL 7.4 6.4 -  Hemoglobin 13.0 - 17.7 g/dL 14.8 15.4 12.2(L)  Hematocrit 37.5 - 51.0 % 44.4 45.4 35.4(L)  Platelets 150 - 450 x10E3/uL 221 208 -    Lab Results  Component Value Date   TSH 2.730 05/23/2020    ==================================================  COVID-19 Education: The signs and symptoms of COVID-19 were discussed with the patient and how to seek care for testing (follow up with PCP or arrange E-visit).   The  importance of social distancing and COVID-19 vaccination was discussed today. The patient is practicing social distancing & Masking.   I spent a total of 12minutes with the patient spent in direct patient consultation.  Additional time spent with chart review  / charting (studies, outside notes, etc): 15 min Total Time: 32 min  Current medicines are reviewed at length with the patient today.  (+/- concerns) none  This visit occurred during the SARS-CoV-2 public health emergency.  Safety protocols were in place, including screening questions prior to the visit, additional usage of staff PPE, and extensive cleaning of exam room while observing appropriate contact time as indicated for disinfecting solutions.  Notice: This dictation was prepared with Dragon dictation along with smaller phrase technology. Any transcriptional errors that result from this process are unintentional and may not be corrected upon review.  Patient Instructions / Medication Changes & Studies & Tests Ordered   Patient Instructions  Medication Instructions:  NO CHANGES *If you need a refill on your cardiac medications before your next appointment, please call your pharmacy*   Lab Work: CBC CMP LIPID TSH HGBA1C If you have labs (blood work) drawn today and your tests are completely normal, you will receive your results only by: [name] MyChart Message (if you have MyChart) OR . A paper copy in the mail If you have any lab test that is abnormal or we need to change your treatment, we will call you to review the results.   Testing/Procedures: Not needed   Follow-Up: At [facility], you and your health needs are our priority.  As part of our continuing mission to provide you with exceptional heart care, we have created designated Provider Care Teams.  These Care Teams include your primary Cardiologist (physician) and Advanced Practice Providers (APPs -  Physician Assistants and Nurse Practitioners) who all work  together to provide you with the care you need, when you need it.  We recommend signing up for the patient portal called "MyChart".  Sign up information is provided on this After Visit Summary.  MyChart is used to connect with patients for Virtual Visits (Telemedicine).  Patients are able to view lab/test results, encounter notes, upcoming appointments, etc.  Non-urgent messages can be sent to your provider as well.   To learn more about what you can do with MyChart, go to [email or web address].    Your next appointment:   12 month(s)  The format for your next appointment:   In Person  Provider:   Glenetta Hew, MD      Studies Ordered:   Orders Placed This Encounter  Procedures  . Lipid panel  . Comprehensive metabolic panel  . CBC  . TSH  . Hemoglobin A1c  . EKG 12-Lead     Glenetta Hew, M.D., M.S. Interventional Cardiologist   Pager # [phone] Phone # [phone] [address] [place], [place] [address]  Thank you for choosing Heartcare at [facility]!!

## 2020-05-23 NOTE — Patient Instructions (Signed)
Medication Instructions:  NO CHANGES *If you need a refill on your cardiac medications before your next appointment, please call your pharmacy*   Lab Work: CBC CMP LIPID TSH HGBA1C If you have labs (blood work) drawn today and your tests are completely normal, you will receive your results only by: [name] MyChart Message (if you have MyChart) OR . A paper copy in the mail If you have any lab test that is abnormal or we need to change your treatment, we will call you to review the results.   Testing/Procedures: Not needed   Follow-Up: At [facility], you and your health needs are our priority.  As part of our continuing mission to provide you with exceptional heart care, we have created designated Provider Care Teams.  These Care Teams include your primary Cardiologist (physician) and Advanced Practice Providers (APPs -  Physician Assistants and Nurse Practitioners) who all work together to provide you with the care you need, when you need it.  We recommend signing up for the patient portal called "MyChart".  Sign up information is provided on this After Visit Summary.  MyChart is used to connect with patients for Virtual Visits (Telemedicine).  Patients are able to view lab/test results, encounter notes, upcoming appointments, etc.  Non-urgent messages can be sent to your provider as well.   To learn more about what you can do with MyChart, go to [email or web address].    Your next appointment:   12 month(s)  The format for your next appointment:   In Person  Provider:   Glenetta Hew, MD

## 2020-05-24 LAB — COMPREHENSIVE METABOLIC PANEL
ALT: 9 IU/L (ref 0–44)
AST: 18 IU/L (ref 0–40)
Albumin/Globulin Ratio: 2.3 — ABNORMAL HIGH (ref 1.2–2.2)
Albumin: 4.8 g/dL — ABNORMAL HIGH (ref 3.6–4.6)
Alkaline Phosphatase: 89 IU/L (ref 44–121)
BUN/Creatinine Ratio: 13 (ref 10–24)
BUN: 14 mg/dL (ref 8–27)
Bilirubin Total: 0.5 mg/dL (ref 0.0–1.2)
CO2: 24 mmol/L (ref 20–29)
Calcium: 9.6 mg/dL (ref 8.6–10.2)
Chloride: 103 mmol/L (ref 96–106)
Creatinine, Ser: 1.06 mg/dL (ref 0.76–1.27)
Globulin, Total: 2.1 g/dL (ref 1.5–4.5)
Glucose: 150 mg/dL — ABNORMAL HIGH (ref 65–99)
Potassium: 4.9 mmol/L (ref 3.5–5.2)
Sodium: 141 mmol/L (ref 134–144)
Total Protein: 6.9 g/dL (ref 6.0–8.5)
eGFR: 71 mL/min/{1.73_m2} (ref >59)

## 2020-05-24 LAB — LIPID PANEL
Chol/HDL Ratio: 3.3 ratio (ref 0.0–5.0)
Cholesterol, Total: 149 mg/dL (ref 100–199)
HDL: 45 mg/dL (ref >39)
LDL Chol Calc (NIH): 85 mg/dL (ref 0–99)
Triglycerides: 102 mg/dL (ref 0–149)
VLDL Cholesterol Cal: 19 mg/dL (ref 5–40)

## 2020-05-24 LAB — CBC
Hematocrit: 44.4 % (ref 37.5–51.0)
Hemoglobin: 14.8 g/dL (ref 13.0–17.7)
MCH: 28.6 pg (ref 26.6–33.0)
MCHC: 33.3 g/dL (ref 31.5–35.7)
MCV: 86 fL (ref 79–97)
Platelets: 221 10*3/uL (ref 150–450)
RBC: 5.17 x10E6/uL (ref 4.14–5.80)
RDW: 13.5 % (ref 11.6–15.4)
WBC: 7.4 10*3/uL (ref 3.4–10.8)

## 2020-05-24 LAB — HEMOGLOBIN A1C
Est. average glucose Bld gHb Est-mCnc: 151 mg/dL
Hgb A1c MFr Bld: 6.9 % — ABNORMAL HIGH (ref 4.8–5.6)

## 2020-05-24 LAB — TSH: TSH: 2.73 u[IU]/mL (ref 0.450–4.500)

## 2020-06-04 ENCOUNTER — Telehealth: Payer: Self-pay | Admitting: *Deleted

## 2020-06-04 ENCOUNTER — Other Ambulatory Visit: Payer: Self-pay | Admitting: *Deleted

## 2020-06-04 ENCOUNTER — Encounter: Payer: Self-pay | Admitting: *Deleted

## 2020-06-04 DIAGNOSIS — I2510 Atherosclerotic heart disease of native coronary artery without angina pectoris: Secondary | ICD-10-CM

## 2020-06-04 DIAGNOSIS — E785 Hyperlipidemia, unspecified: Secondary | ICD-10-CM

## 2020-06-04 DIAGNOSIS — Z955 Presence of coronary angioplasty implant and graft: Secondary | ICD-10-CM

## 2020-06-04 MED ORDER — ROSUVASTATIN CALCIUM 20 MG PO TABS: 20.0000 mg | ORAL_TABLET | Freq: Every day | ORAL | 3 refills | Status: DC

## 2020-06-04 MED ORDER — PRAVASTATIN SODIUM 40 MG PO TABS: 40.0000 mg | ORAL_TABLET | Freq: Every day | ORAL | 1 refills | Status: DC

## 2020-06-04 NOTE — Telephone Encounter (Signed)
The patient's daughter [name] has been notified of the result and verbalized understanding.  All questions (if any) were answered.  Lab slip mailed for Aug 2022. Prescription sent to pharmacy Raiford Simmonds, RN 06/04/2020 12:03 PM

## 2020-06-04 NOTE — Telephone Encounter (Signed)
Patient's daughter returning call. 

## 2020-06-04 NOTE — Telephone Encounter (Signed)
-----   Message from [name], MD sent at 06/02/2020  5:36 PM EDT ----- Interestingly, your lipid panel has gotten a little bit worse this year.  The total cholesterol went up from 126 up to 149 and the LDL from 65-85.  Otherwise a chemistry done looks good with normal kidney and liver function.  Stable blood counts.  Normal TSH/thyroid panel.  A1c is a little bit up-6.9.  This may have something to do with change in cholesterol panel as well.  I would like to try to change from pravastatin 40 mg to rosuvastatin 20 mg to see if he tolerates.  If not, we can try adding Zetia 10 mg daily.  In other words, I do not want him to get rid of the pravastatin yet., in case he does not tolerate rosuvastatin.  Plan: Replace pravastatin with rosuvastatin 20 mg p.o. daily.  Dispense 30 tabs, 3 refills.  If he tolerates, then will change to 90 tabs.  Recheck lipids in 4 months.  Glenetta Hew, MD

## 2020-06-04 NOTE — Telephone Encounter (Signed)
Left message to call back -  Discuss  Lab results

## 2020-06-09 ENCOUNTER — Encounter: Payer: Self-pay | Admitting: Cardiology

## 2020-06-09 NOTE — Assessment & Plan Note (Signed)
Pravastatin is what he seems to be able to tolerate.  We checked his labs today.  Previously been well controlled.  Today they are not as well controlled.  I would like to try to change from pravastatin 40 mg to rosuvastatin 20 mg to see if he tolerates. If not, we can try adding Zetia 10 mg daily. In other words, I do not want him to get rid of the pravastatin yet., in case he does not tolerate rosuvastatin.  Plan: Replace pravastatin with rosuvastatin 20 mg p.o. daily. Dispense 30 tabs, 3 refills. If he tolerates, then will change to 90 tabs.  Recheck lipids in 4 months

## 2020-06-09 NOTE — Assessment & Plan Note (Signed)
He has at least 1 non-STEMI and then several unstable angina presentations.  He has an area of inferior perfusion defect on his Myoview stress test previously read his diaphragmatic attenuation which is probably related to the occluded RCA system (both native vessel and graft).  The lesion is fixed with no evidence of ischemia. He does have multiple years EF but no true active heart failure symptoms from this.

## 2020-06-09 NOTE — Assessment & Plan Note (Signed)
Not sure if he truly colic exertional dyspnea an anginal equivalent, but he does have a stable exertional dyspnea that has been that way since have known him.  No active angina type chest discomfort although he never recalls having chest discomfort.  Consider maybe mild microvascular disease based on extensive native CAD.  Plan: Continue with atenolol and Imdur along with the amlodipine portion of Lotrel. Rarely has used sublingual NTG.[name]

## 2020-06-09 NOTE — Assessment & Plan Note (Addendum)
He has had significant issues with orthostatic hypotension in the past, we have reduced his blood pressure medications.  I am allowing for more permissive hypertension although the pressures today were little higher than I would like.  Initial check was 150/74 on recheck with more consistent with 140/70.  Given his advanced age, I think constipation makes sense.  He is already on essentially 3 medications including combination amlodipine-benazepril and atenolol along with Imdur.  Would not be more aggressive.  In fact he wanted to stay adequately hydrated.  We are also trying to avoid using diuretics for his edema.

## 2020-06-09 NOTE — Assessment & Plan Note (Signed)
On maintenance dose Brilinta 60 mg twice daily without aspirin.  Tolerating well.  He has extensive stent work in the SVG-OM system.  This includes overlapping BMS and DES that have already had issues with ISR.    Plan: Continue Brilinta for now as [place] as he has had no issues.  Okay to hold Brilinta 5 days preop for surgery procedure.

## 2020-06-09 NOTE — Assessment & Plan Note (Signed)
Mild stable anginal symptoms.  No active concerns.  He is on stable regimen of beta-blocker, ACE) calcium blocker along with Imdur.  He is on statin which we are converting to a more potent statin.  He remains on maintenance dose Brilinta because he essentially is living to grasp 1 of which has 3 stents.  Okay to hold Brilinta 5 days preop for surgery procedures.

## 2020-06-12 NOTE — Progress Notes (Addendum)
[facility] Note  06/13/2020     CHIEF COMPLAINT Patient presents for Retina Follow Up   HISTORY OF PRESENT ILLNESS: Xavier Giles is a 80 y.o. 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  Severity is moderate.  Duration of 9 weeks.  Since onset it is stable.  I, the attending physician,  performed the HPI with the patient and updated documentation appropriately.          Comments    Patient states vision the same OU.       Last edited by Bernarda Caffey, MD on 06/13/2020  8:10 AM. (History)    pt states no change in vision   Referring physician: [name], MD [address] [place],  [place] [address]  HISTORICAL INFORMATION:   Selected notes from the MEDICAL RECORD NUMBER Referred by Dr. Parke Simmers for concern of ARMD OU   CURRENT MEDICATIONS: No current outpatient medications on file. (Ophthalmic Drugs)   No current facility-administered medications for this visit. (Ophthalmic Drugs)   Current Outpatient Medications (Other)  Medication Sig  . amLODipine-benazepril (LOTREL) 10-20 MG capsule Take 1 capsule by mouth daily.  [name] atenolol (TENORMIN) 50 MG tablet Take 1 tablet by mouth once daily  . isosorbide mononitrate (IMDUR) 60 MG 24 hr tablet TAKE 1 TABLET BY MOUTH ONCE DAILY. PLEASE SCHEDULE AN APPT FOR FUTURE REFILLS.  [name] nitroGLYCERIN (NITROSTAT) 0.4 MG SL tablet Place 1 tablet (0.4 mg total) under the tongue every 5 (five) minutes as needed (up to 3 doses).  . pravastatin (PRAVACHOL) 40 MG tablet Take 1 tablet (40 mg total) by mouth daily.  . rosuvastatin (CRESTOR) 20 MG tablet Take 1 tablet (20 mg total) by mouth daily.  . ticagrelor (BRILINTA) 60 MG TABS tablet Take 1 tablet (60 mg total) by mouth 2 (two) times daily.   No current facility-administered medications for this visit. (Other)      REVIEW OF SYSTEMS: ROS    Positive for:  Musculoskeletal, Cardiovascular, Eyes, Respiratory   Negative for: Constitutional, Gastrointestinal, Neurological, Skin, Genitourinary, HENT, Endocrine, Psychiatric, Allergic/Imm, Heme/Lymph   Last edited by Roselee Nova D, COT on 06/13/2020  7:36 AM. (History)       ALLERGIES Allergies  Allergen Reactions  . Keflex [Cephalexin] Other (See Comments)    Unknown   . Zocor [Simvastatin] Other (See Comments)    Unknown   . Lipitor [Atorvastatin] Other (See Comments)    Elevated blood sugar  . Sulfa Antibiotics Nausea Only    PAST MEDICAL HISTORY Past Medical History:  Diagnosis Date  . Asthma   . CAD (coronary artery disease) of bypass graft 2003   (now known CTO of SVG-rPDA & SVG-D1 & 3 stents in SeqSVg-Om1-OM2 (1 in prox leg, overlapping DES-BMS from OM1-OM2 into OM2 - s/p PTCA for ISR).; LIMA-LAD patent   . CAD S/P PTCA-PCI of SeqSVG-OM1-OM2 2003; 01/2012; 05/2013   a) '03 SeqSVG-OM1-OM2,  Zeta BMS 3.0x8 Seq limb into OM2), & PTCA of SVg-D1 anastomosis; b) 2013: Distal Limb of SeqSVG-OM1-OM2 from OM1-2 overlap BMS ->Xience DES 2.75 x 15 - 75mm;; c) 05/2013 Proxlimb SVG--OM1-OM2 -> Resolute DES 3 x 26 - 3.2mm; Angiosculpt PTCA of ISR in Seq Limb overlaping stents (3.25 mm)  . CAD, multiple vessel - Native CAD. 1998; 2015   a) Referred for CABGx5 ; by Cath  05/2013: dLM 60%, Ost LAD 70&. Prox -mid LAD extensive 70%( to-fro flow); D1 80%; OM1 & OM2 (CTO), follow-on LCx beyond AVG 90+% with small OM3/OM4; RCA TO (R-R collaterals);  [name] Carotid bruit present 01/2012   a. asymptomatic; carotid doppler 01/2012 - R bulb/prox ICA mild-mod amt of fibrous plaque, L subclavian 70-99% diameter reduction, L bulb/ICA mild amt of fibrous plaque  . Chronic low back pain   . Dyslipidemia, goal LDL below 70   . Hypertension, essential   . Hypertensive retinopathy    OU  . Macular degeneration    Dry OD, Wet OS  . Non-ST elevated myocardial infarction (non-STEMI) / Crescendo Angina 01/2012   Cath noted  90% stenosis from OM1-OM2 in Seq Limb of SVG-OM1-OM2 --> DES PCI overlaps prior BMS. Also noted CTO of SVG-Diag; b) CRESCENDO ANGINA: 05/2013 - prox limb SVG-OM1-OM2 DES PCI & PTCA of 95% ISR in distal Limb overlapping DES-BMS; Myoview 08/25/13 - small non-T-mural Inf scar, No Ischemia  . S/P CABG x 5 1998   LIMA-LAD, SeqSVG-OM1-OM2; SVG-rPDA, SVG-D1    Past Surgical History:  Procedure Laterality Date  . CATARACT EXTRACTION Bilateral    Dr. Dolores Lory  . CATARACT EXTRACTION W/ INTRAOCULAR LENS  IMPLANT, BILATERAL Bilateral   . CORONARY ANGIOPLASTY WITH STENT PLACEMENT  2003   DR LITTLE: BMS 3.0 mm x 8 mm stent to Seq limb anastomosis of SeqSVG-OM1-OM2 into OM2; & cutting balloon SVG-Diag  . CORONARY ANGIOPLASTY WITH STENT PLACEMENT  06/28/2013   Procedure: CORONARY ANGIOPLASTY WITH STENT PLACEMENT ;  Surgeon: Troy Sine, MD;  Location: [facility] CATH LAB;  Service: Cardiovascular: PCI to SVG-CxOM1-OM2 99% prox lesion (Resolute DES 3.0 x 26 - 3.4 mm) & PTCA of  95% ISR in sequential limb (3.0 mm Angiosculpt)   . CORONARY ARTERY BYPASS GRAFT  1998   SVG-diagonal, DVG-OM, DVG-PDA, LIMA-LAD  . CORONARY STENT INTERVENTION  01/13/2012     Procedure: CORONARY STENT INTERVENTION ;  Surgeon: Lorretta Harp, MD;  Location: [facility] CATH LAB;  Service: Cardiovascular: PCI Seq limb of Seq SVG-OM1-OM2 90% -> 0% - Xience Xpedition DES 2.75 MM x15 MM (3 MM) overlaps prox edge of BMS into OM2  . ESOPHAGOGASTRODUODENOSCOPY N/A 06/29/2013   Procedure: ESOPHAGOGASTRODUODENOSCOPY (EGD);  Surgeon: Beryle Beams, MD;  Location: [facility] ENDOSCOPY;  Service: Endoscopy;  Laterality: N/A;  . EYE SURGERY Bilateral    Cat Sx - Dr. Dolores Lory  . LEFT HEART CATH AND CORS/GRAFTS ANGIOGRAPHY  2003   ABN CARDIOLITE:  100% SVG-PDA, patent LIMA; 80% anastomotic lesion SVG-Diag; 99% anastomotic lesion of Seq limb SVG-OM1-OM2; Native LM OK - 50% & 80% prox LAD, 80% D1; Native OM1 & OM2 TO; Native RCA CTO (R_R collaterals). => PCI of SVG-OM2 & PTCA of  SVG-Diag  . LEFT HEART CATHETERIZATION WITH CORONARY/GRAFT ANGIOGRAM N/A 01/13/2012   Procedure: LEFT HEART CATHETERIZATION WITH Beatrix Fetters;  Surgeon: Lorretta Harp, MD;  Location: [facility] CATH LAB;  Service: Cardiovascular: SVG-D1 CTO (new), knosn SVG-RCA CTO.  Native mRCA CTO (R-R collaterals); 50% distal LM, 70-80% prox LAD and 90% mid.  LIMA-LAD patent w/  70% post anastomosis. OM1 &OM2 CTO. Seq limb of SeqSVG-OM1-OM2 90% @ OM1 to BMS into OM2.  [name] LEFT HEART CATHETERIZATION WITH CORONARY/GRAFT ANGIOGRAM N/A 06/28/2013   Procedure: LEFT HEART CATHETERIZATION WITH Beatrix Fetters;  Surgeon: Troy Sine, MD;  Location: [facility] CATH LAB;  Service: Cardiovascular: Cres Angina: a) 60% dLM; 70% Ostial LAD; 90% Cx - no OMs seen; 100% RCA;; SVG-D1 &  SVG--dRCA occluded; Patent LIMA-LAD with dLAD ~70-80%;; SeqSVG-OM1-OM2: 95% proximal limb, 50% mid & Diffuse ISR in overlapping DES/BMS in Seq Limb OM-OM2  . NM MYOVIEW LTD  07/2013   Test looked good!! Evidence of possible old MI / scar, but nothing to suggest ischemia. Not Gated b/c ectopy .  [name] NM MYOVIEW LTD  06/2017   Intermediate risk because of reduced EF but stable.  Stable fixed inferior defect consistent with either diaphragmatic attenuation or inferior infarct.  Septal hypokinesis/dyskinesis due to LBBB and prior CABG.  EF is 46%.  Stable compared to 2015 following PCI  . TONSILLECTOMY      FAMILY HISTORY Family History  Problem Relation Age of Onset  . Cancer Mother   . Heart attack Father     SOCIAL HISTORY Social History   Tobacco Use  . Smoking status: Former Smoker    Packs/day: 2.00    Years: 30.00    Pack years: 60.00    Types: Cigarettes    Quit date: 03/02/1990    Years since quitting: 30.3  . Smokeless tobacco: Never Used  Substance Use Topics  . Alcohol use: Yes    Comment: 06/28/2013 "aien't drank in years; used to drink a little beer"  . Drug use: No         OPHTHALMIC EXAM:  Base Eye Exam     Visual Acuity (Snellen - Linear)      Right Left   Dist cc CF at 3' 20/30   Dist ph cc NI NI       Tonometry (Tonopen, 7:43 AM)      Right Left   Pressure 18 13       Pupils      Dark Light Shape React APD   Right 3 2 Round Minimal +1   Left 3 2 Round Brisk None       Visual Fields (Counting fingers)      Left Right    Full Full       Extraocular Movement      Right Left    Full, Ortho Full, Ortho       Neuro/Psych    Oriented x3: Yes   Mood/Affect: Normal       Dilation    Both eyes: 1.0% Mydriacyl, 2.5% Phenylephrine @ 7:43 AM        Slit Lamp and Fundus Exam    Slit Lamp Exam      Right Left   Lids/Lashes Dermatochalasis - upper lid Dermatochalasis - upper lid   Conjunctiva/Sclera White and quiet White and quiet   Cornea 1+ Punctate epithelial erosions, Arcus, 1+endopigment 2-3+ Punctate epithelial erosions, Arcus, 1+endopigment, irregular epi, decreased TBUT   Anterior Chamber deep, 0.5+pigment, mild pigmented vitreous prolapse temporal pupil Deep and quiet   Iris Round and dilated Round and dilated   Lens 3 piece PC IOL displaced inferiorly 3 piece PC IOL in good position   Vitreous Vitreous syneresis, Posterior vitreous detachment, vitreous condensations Vitreous syneresis, Posterior vitreous detachment       Fundus Exam      Right Left   Disc mild Pallor, Sharp rim, PPA/PPP mild Pallor, Sharp rim, +PPA   C/D Ratio 0.3 0.3   Macula Flat, Blunted foveal reflex, Drusen, RPE mottling, clumping and atrophy -- prominent central pigment clumping Flat, Blunted foveal reflex, Drusen, cystic changes --  stably improved; RPE mottling, clumping and atrophy, +GA surrounding fovea and extending to disc, +central pigment clumping, no heme   Vessels Mild  Vascular attenuation, mild tortuousity Mild Vascular attenuation   Periphery Attached, reticular degeneration, No heme  Attached, reticular degeneration, flat, round pigmented nevus at 1200 (~1.5DD)        Refraction     Wearing Rx      Sphere Cylinder Axis Add   Right +2.25 +1.50 008 +0.50   Left +0.00 +2.00 162 +2.75          IMAGING AND PROCEDURES  Imaging and Procedures for @TODAY @  OCT, Retina - OU - Both Eyes       Right Eye Quality was good. Central Foveal Thickness: 202. Progression has been stable. Findings include abnormal foveal contour, outer retinal atrophy, subretinal hyper-reflective material, pigment epithelial detachment, retinal drusen , no SRF, no IRF (Persistent ORA / SRHM).   Left Eye Quality was good. Central Foveal Thickness: 251. Progression has been stable. Findings include normal foveal contour, no SRF, outer retinal atrophy, retinal drusen , pigment epithelial detachment, subretinal hyper-reflective material, no IRF (stable improvement in IRF; persistent diffuse ORA, partial PVD).   Notes *Images captured and stored on drive  Diagnosis / Impression:  OD: Non-exu ARMD with persistent ORA / SRHM OS: exudative ARMD w/ stable improvement in IRF; persistent diffuse ORA  Clinical management:  See below  Abbreviations: NFP - Normal foveal profile. CME - cystoid macular edema. PED - pigment epithelial detachment. IRF - intraretinal fluid. SRF - subretinal fluid. EZ - ellipsoid zone. ERM - epiretinal membrane. ORA - outer retinal atrophy. ORT - outer retinal tubulation. SRHM - subretinal hyper-reflective material        Intravitreal Injection, Pharmacologic Agent - OS - Left Eye       Time Out 06/13/2020. 8:02 AM. Confirmed correct patient, procedure, site, and patient consented.   Anesthesia Topical anesthesia was used. Anesthetic medications included Lidocaine 2%, Proparacaine 0.5%.   Procedure Preparation included 5% betadine to ocular surface, eyelid speculum. A supplied needle was used.   Injection:  1.25 mg Bevacizumab (AVASTIN) 1.25mg /0.65mL SOLN   NDC: 62130-865-78, Lot: [id number] , Expiration date: 07/14/2020   Route: Intravitreal, Site: Left Eye,  Waste: 0 mL  Post-op Post injection exam found visual acuity of at least counting fingers. The patient tolerated the procedure well. There were no complications. The patient received written and verbal post procedure care education. Post injection medications were not given.                 ASSESSMENT/PLAN:    ICD-10-CM   1. Exudative age-related macular degeneration of left eye with active choroidal neovascularization (HCC)  H35.3221 Intravitreal Injection, Pharmacologic Agent - OS - Left Eye    Bevacizumab (AVASTIN) SOLN 1.25 mg  2. Retinal edema  H35.81 OCT, Retina - OU - Both Eyes  3. 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 Essential hypertension  I10   5. Hypertensive retinopathy of both eyes  H35.033   6. Pseudophakia of both eyes  Z96.1   7. Dislocation of intraocular lens, sequela  T85.22XS   8. Vitreous prolapse of right eye  H43.01   9. 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 Exudative age related macular degeneration OS  - OS with interval conversion to exu ARMD noted on 04.26.21  - s/p IVA OS #1 (04.26.21), #2 (05.26.21), #3 (06.24.21), #4 (08.05.21), #5 (09.16.21), #6 (10.28.21), #7 (12.16.21), #8 (2.10.22)  - BCVA 20/30 -- stable  - OCT shows persistent ORA / SRHM -- no  IRF/SRF  - recommend IVA OS #9 today, 4.14.22, maintenance w/ ext to 10 wks  - pt wishes to proceed with IVA  - RBA of procedure discussed, questions answered  - informed consent obtained and signed  - see procedure note   - Avastin informed consent form signed and scanned on 04.26.2021  - f/u 10 wks, DFE, OCT, possible injection - tx and ext slowly as able (functionally monocular)  3. Nonexudative ARMD OD  - OD advanced stage with foveal involvement -- BCVA CF 3' from geographic atrophy Age related macular degeneration, non-exudative, both eyes  - The incidence, anatomy, and pathology of dry AMD, risk of  progression, and the AREDS and AREDS 2 study including smoking risks discussed with patient.  - Recommend amsler grid monitoring   - f/u in 10 weeks  4,5. Hypertensive retinopathy OU  - discussed importance of tight BP control  - monitor  6-8. Pseudophakia OU w/ dislocated IOL and vitreous prolapse OD  - s/p CE/IOL OU (Dr. Dolores Lory)  - OS: 3-piece IOL in good position   - OD: 3-piece IOL dislocated inferiorly with mild vitreous prolapse temporal pupil -- IOL relatively stable  - OD with vision limited by advanced ARMD -- no intervention recommended at this time  - recommend monitoring for now -- may need PPV / IOL exchange if IOL dislocates completely   9. Choroidal nevus OS  - flat, pigmented round lesion at 12 oclock periphery  - 1.5DD in size; no SRF or orange pigment  - monitor    Ophthalmic Meds Ordered this visit:  Meds ordered this encounter  Medications  . Bevacizumab (AVASTIN) SOLN 1.25 mg      Return in about 10 weeks (around 08/22/2020) for f/u exu ARMD OS, DFE, OCT.  There are no Patient Instructions on file for this visit.   This document serves as a record of services personally performed by Gardiner Sleeper, MD, PhD. It was created on their behalf by Estill Bakes, COT an ophthalmic technician. The creation of this record is the provider's dictation and/or activities during the visit.    Electronically signed by: Estill Bakes, COT 06/12/20 @ 8:44 AM   This document serves as a record of services personally performed by Gardiner Sleeper, MD, PhD. It was created on their behalf by San Jetty. Owens Shark, OA an ophthalmic technician. The creation of this record is the provider's dictation and/or activities during the visit.    Electronically signed by: San Jetty. Marguerita Merles 04.14.2022 8:44 AM  Gardiner Sleeper, M.D., Ph.D. Diseases & Surgery of the Retina and [place] 06/13/2020   I have reviewed the above documentation for accuracy and  completeness, and I agree with the above. Gardiner Sleeper, M.D., Ph.D. 06/13/20 8:44 AM  Abbreviations: M myopia (nearsighted); A astigmatism; H hyperopia (farsighted); P presbyopia; Mrx spectacle prescription;  CTL contact lenses; OD right eye; OS left eye; OU both eyes  XT exotropia; ET esotropia; PEK punctate epithelial keratitis; PEE punctate epithelial erosions; DES dry eye syndrome; MGD meibomian gland dysfunction; ATs artificial tears; PFAT's preservative free artificial tears; [place] nuclear sclerotic cataract; PSC posterior subcapsular cataract; ERM epi-retinal membrane; PVD posterior vitreous detachment; RD retinal detachment; DM diabetes mellitus; DR diabetic retinopathy; NPDR non-proliferative diabetic retinopathy; PDR proliferative diabetic retinopathy; CSME clinically significant macular edema; DME diabetic macular edema; dbh dot blot hemorrhages; CWS cotton wool spot; POAG primary open angle glaucoma; C/D cup-to-disc ratio; HVF humphrey visual field; GVF goldmann visual  field; OCT optical coherence tomography; IOP intraocular pressure; BRVO Branch retinal vein occlusion; CRVO central retinal vein occlusion; CRAO central retinal artery occlusion; BRAO branch retinal artery occlusion; RT retinal tear; SB scleral buckle; PPV pars plana vitrectomy; VH Vitreous hemorrhage; PRP panretinal laser photocoagulation; IVK intravitreal kenalog; VMT vitreomacular traction; MH Macular hole;  NVD neovascularization of the disc; NVE neovascularization elsewhere; AREDS age related eye disease study; ARMD age related macular degeneration; POAG primary open angle glaucoma; EBMD epithelial/anterior basement membrane dystrophy; ACIOL anterior chamber intraocular lens; IOL intraocular lens; PCIOL posterior chamber intraocular lens; Phaco/IOL phacoemulsification with intraocular lens placement; Delta photorefractive keratectomy; LASIK laser assisted in situ keratomileusis; HTN hypertension; DM diabetes mellitus; COPD chronic  obstructive pulmonary disease

## 2020-06-13 ENCOUNTER — Encounter (INDEPENDENT_AMBULATORY_CARE_PROVIDER_SITE_OTHER): Payer: Self-pay | Admitting: Ophthalmology

## 2020-06-13 ENCOUNTER — Other Ambulatory Visit: Payer: Self-pay

## 2020-06-13 ENCOUNTER — Ambulatory Visit (INDEPENDENT_AMBULATORY_CARE_PROVIDER_SITE_OTHER): Payer: Medicare Other | Admitting: Ophthalmology

## 2020-06-13 DIAGNOSIS — H35033 Hypertensive retinopathy, bilateral: Secondary | ICD-10-CM

## 2020-06-13 DIAGNOSIS — T8522XS Displacement of intraocular lens, sequela: Secondary | ICD-10-CM

## 2020-06-13 DIAGNOSIS — H3581 Retinal edema: Secondary | ICD-10-CM | POA: Diagnosis not present

## 2020-06-13 DIAGNOSIS — H353221 Exudative age-related macular degeneration, left eye, with active choroidal neovascularization: Secondary | ICD-10-CM | POA: Diagnosis not present

## 2020-06-13 DIAGNOSIS — H353114 Nonexudative age-related macular degeneration, right eye, advanced atrophic with subfoveal involvement: Secondary | ICD-10-CM | POA: Diagnosis not present

## 2020-06-13 DIAGNOSIS — H4301 Vitreous prolapse, right eye: Secondary | ICD-10-CM

## 2020-06-13 DIAGNOSIS — I10 Essential (primary) hypertension: Secondary | ICD-10-CM

## 2020-06-13 DIAGNOSIS — Z961 Presence of intraocular lens: Secondary | ICD-10-CM

## 2020-06-13 DIAGNOSIS — D3132 Benign neoplasm of left choroid: Secondary | ICD-10-CM

## 2020-06-13 MED ORDER — BEVACIZUMAB CHEMO INJECTION 1.25MG/0.05ML SYRINGE FOR KALEIDOSCOPE
1.2500 mg | INTRAVITREAL | Status: AC | PRN
  Administered 2020-06-13: 1.25 mg via INTRAVITREAL

## 2020-08-06 ENCOUNTER — Other Ambulatory Visit: Payer: Self-pay | Admitting: Cardiology

## 2020-08-20 NOTE — Progress Notes (Signed)
[facility] Note  08/22/2020     CHIEF COMPLAINT Patient presents for Retina Follow Up   HISTORY OF PRESENT ILLNESS: Xavier Giles is a 80 y.o. 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  Duration of 10 weeks.  Since onset it is stable.  I, the attending physician,  performed the HPI with the patient and updated documentation appropriately.        Comments   10 week follow up Exu ARMD OS-  No changes in vision since last visit OU.       Last edited by Bernarda Caffey, MD on 08/22/2020  8:41 AM.     pt states no change in vision   Referring physician: No referring provider defined for this encounter.  HISTORICAL INFORMATION:   Selected notes from the MEDICAL RECORD NUMBER Referred by Dr. Parke Simmers for concern of ARMD OU   CURRENT MEDICATIONS: No current outpatient medications on file. (Ophthalmic Drugs)   No current facility-administered medications for this visit. (Ophthalmic Drugs)   Current Outpatient Medications (Other)  Medication Sig   amLODipine-benazepril (LOTREL) 10-20 MG capsule TAKE 1 CAPSULE BY MOUTH DAILY   atenolol (TENORMIN) 50 MG tablet Take 1 tablet by mouth once daily   isosorbide mononitrate (IMDUR) 60 MG 24 hr tablet TAKE 1 TABLET BY MOUTH ONCE DAILY. PLEASE SCHEDULE AN APPT FOR FUTURE REFILLS.   nitroGLYCERIN (NITROSTAT) 0.4 MG SL tablet Place 1 tablet (0.4 mg total) under the tongue every 5 (five) minutes as needed (up to 3 doses).   pravastatin (PRAVACHOL) 40 MG tablet Take 1 tablet (40 mg total) by mouth daily.   rosuvastatin (CRESTOR) 20 MG tablet Take 1 tablet (20 mg total) by mouth daily.   ticagrelor (BRILINTA) 60 MG TABS tablet Take 1 tablet (60 mg total) by mouth 2 (two) times daily.   No current facility-administered medications for this visit. (Other)      REVIEW OF SYSTEMS: ROS   Positive for: Musculoskeletal, Cardiovascular,  Eyes, Respiratory Negative for: Constitutional, Gastrointestinal, Neurological, Skin, Genitourinary, HENT, Endocrine, Psychiatric, Allergic/Imm, Heme/Lymph Last edited by Leonie Douglas, COA on 08/22/2020  7:49 AM.        ALLERGIES Allergies  Allergen Reactions   Keflex [Cephalexin] Other (See Comments)    Unknown    Zocor [Simvastatin] Other (See Comments)    Unknown    Lipitor [Atorvastatin] Other (See Comments)    Elevated blood sugar   Sulfa Antibiotics Nausea Only    PAST MEDICAL HISTORY Past Medical History:  Diagnosis Date   Asthma    CAD (coronary artery disease) of bypass graft 2003   (now known CTO of SVG-rPDA & SVG-D1 & 3 stents in SeqSVg-Om1-OM2 (1 in prox leg, overlapping DES-BMS from OM1-OM2 into OM2 - s/p PTCA for ISR).; LIMA-LAD patent    CAD S/P PTCA-PCI of SeqSVG-OM1-OM2 2003; 01/2012; 05/2013   a) '03 SeqSVG-OM1-OM2,  Zeta BMS 3.0x8 Seq limb into OM2), & PTCA of SVg-D1 anastomosis; b) 2013: Distal Limb of SeqSVG-OM1-OM2 from OM1-2 overlap BMS ->Xience DES 2.75 x 15 - 42mm;; c) 05/2013 Proxlimb SVG--OM1-OM2 -> Resolute DES 3 x 26 - 3.21mm; Angiosculpt PTCA of ISR in Seq Limb overlaping stents (3.25 mm)   CAD, multiple vessel - Native CAD. 1998; 2015   a) Referred for CABGx5 ; by Cath 05/2013: dLM 60%, Ost LAD 70&. Prox -mid LAD  extensive 70%( to-fro flow); D1 80%; OM1 & OM2 (CTO), follow-on LCx beyond AVG 90+% with small OM3/OM4; RCA TO (R-R collaterals);   Carotid bruit present 01/2012   a. 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 Also noted CTO of SVG-Diag; b) CRESCENDO ANGINA: 05/2013 - prox limb SVG-OM1-OM2 DES PCI & PTCA of 95% ISR in distal Limb overlapping DES-BMS; Myoview 08/25/13 - small non-T-mural Inf scar, No Ischemia   S/P CABG x 5 1998   LIMA-LAD, SeqSVG-OM1-OM2; SVG-rPDA, SVG-D1    Past Surgical History:  Procedure Laterality Date   CATARACT EXTRACTION Bilateral    Dr. Dolores Lory   CATARACT EXTRACTION W/ INTRAOCULAR LENS  IMPLANT, BILATERAL Bilateral    CORONARY ANGIOPLASTY WITH STENT PLACEMENT  2003   DR LITTLE: BMS 3.0 mm x 8 mm stent to Seq limb anastomosis of SeqSVG-OM1-OM2 into OM2; & cutting balloon SVG-Diag   CORONARY ANGIOPLASTY WITH STENT PLACEMENT  06/28/2013   Procedure: CORONARY ANGIOPLASTY WITH STENT PLACEMENT ;  Surgeon: Troy Sine, MD;  Location: [facility] CATH LAB;  Service: Cardiovascular: PCI to SVG-CxOM1-OM2 99% prox lesion (Resolute DES 3.0 x 26 - 3.4 mm) & PTCA of  95% ISR in sequential limb (3.0 mm Angiosculpt)    CORONARY ARTERY BYPASS GRAFT  1998   SVG-diagonal, DVG-OM, DVG-PDA, LIMA-LAD   CORONARY STENT INTERVENTION  01/13/2012     Procedure: CORONARY STENT INTERVENTION ;  Surgeon: Lorretta Harp, MD;  Location: [facility] CATH LAB;  Service: Cardiovascular: PCI Seq limb of Seq SVG-OM1-OM2 90% -> 0% - Xience Xpedition DES 2.75 MM x15 MM (3 MM) overlaps prox edge of BMS into OM2   ESOPHAGOGASTRODUODENOSCOPY N/A 06/29/2013   Procedure: ESOPHAGOGASTRODUODENOSCOPY (EGD);  Surgeon: Beryle Beams, MD;  Location: [facility] ENDOSCOPY;  Service: Endoscopy;  Laterality: N/A;   EYE SURGERY Bilateral    Cat Sx - Dr. Dolores Lory   LEFT HEART CATH AND CORS/GRAFTS ANGIOGRAPHY  2003   ABN CARDIOLITE:  100% SVG-PDA, patent LIMA; 80% anastomotic lesion SVG-Diag; 99% anastomotic lesion of Seq limb SVG-OM1-OM2; Native LM OK - 50% & 80% prox LAD, 80% D1; Native OM1 & OM2 TO; Native RCA CTO (R_R collaterals). => PCI of SVG-OM2 & PTCA of SVG-Diag   LEFT HEART CATHETERIZATION WITH CORONARY/GRAFT ANGIOGRAM N/A  01/13/2012   Procedure: LEFT HEART CATHETERIZATION WITH Beatrix Fetters;  Surgeon: Lorretta Harp, MD;  Location: [facility] CATH LAB;  Service: Cardiovascular: SVG-D1 CTO (new), knosn SVG-RCA CTO.  Native mRCA CTO (R-R collaterals); 50% distal LM, 70-80% prox LAD and 90% mid.  LIMA-LAD patent w/  70% post anastomosis. OM1 &OM2 CTO. Seq limb of SeqSVG-OM1-OM2 90% @ OM1 to BMS into OM2.   LEFT HEART CATHETERIZATION WITH CORONARY/GRAFT ANGIOGRAM N/A 06/28/2013   Procedure: LEFT HEART CATHETERIZATION WITH Beatrix Fetters;  Surgeon: Troy Sine, MD;  Location: [facility] CATH LAB;  Service: Cardiovascular: Cres Angina: a) 60% dLM; 70% Ostial LAD; 90% Cx - no OMs seen; 100% RCA;; SVG-D1 & SVG--dRCA occluded; Patent LIMA-LAD with dLAD ~70-80%;; SeqSVG-OM1-OM2:  95% proximal limb, 50% mid & Diffuse ISR in overlapping DES/BMS in Seq Limb OM-OM2   NM MYOVIEW LTD  07/2013   Test looked good!! Evidence of possible old MI / scar, but nothing to suggest ischemia. Not Gated b/c ectopy .   NM MYOVIEW LTD  06/2017   Intermediate risk because of reduced EF but stable.  Stable fixed inferior defect consistent with either diaphragmatic attenuation or inferior infarct.  Septal hypokinesis/dyskinesis due to LBBB and prior CABG.  EF is 46%.  Stable compared to 2015 following PCI   TONSILLECTOMY      FAMILY HISTORY Family History  Problem Relation Age of Onset   Cancer Mother    Heart attack Father     SOCIAL HISTORY Social History   Tobacco Use   Smoking status: Former    Packs/day: 2.00    Years: 30.00    Pack years: 60.00    Types: Cigarettes    Quit date: 03/02/1990    Years since quitting: 30.4   Smokeless tobacco: Never  Substance Use Topics   Alcohol use: Yes    Comment: 06/28/2013 "aien't drank in years; used to drink a little beer"   Drug use: No         OPHTHALMIC EXAM:  Base Eye Exam     Visual Acuity (Snellen - Linear)       Right Left   Dist cc CF 3' 20/40 +1   Dist ph cc NI NI          Tonometry (Tonopen, 7:55 AM)       Right Left   Pressure 21 20         Pupils       Dark Light Shape React APD   Right 4 3 Round Brisk None   Left 4 3 Round Brisk None         Visual Fields (Counting fingers)       Left Right    Full Full         Extraocular Movement       Right Left    Full Full         Neuro/Psych     Oriented x3: Yes   Mood/Affect: Normal         Dilation     Both eyes: 1.0% Mydriacyl, 2.5% Phenylephrine @ 7:55 AM           Slit Lamp and Fundus Exam     Slit Lamp Exam       Right Left   Lids/Lashes Dermatochalasis - upper lid Dermatochalasis - upper lid   Conjunctiva/Sclera White and quiet White and quiet   Cornea 1+ Punctate epithelial erosions, Arcus, 1+endopigment 2-3+ Punctate epithelial erosions, Arcus, 1+endopigment, irregular epi, decreased TBUT   Anterior Chamber deep, 0.5+pigment, mild pigmented vitreous prolapse temporal pupil Deep and quiet   Iris Round and dilated Round and dilated   Lens 3 piece PC IOL displaced inferiorly 3 piece PC IOL in good position   Vitreous Vitreous syneresis, Posterior vitreous detachment, vitreous condensations Vitreous syneresis, Posterior vitreous detachment         Fundus Exam       Right Left   Disc mild Pallor, Sharp rim, PPA/PPP mild Pallor, Sharp rim, +PPA   C/D Ratio 0.3 0.3   Macula Flat, Blunted foveal reflex, Drusen, RPE mottling, clumping and atrophy -- prominent central pigment clumping Flat, Blunted foveal reflex, Drusen, cystic changes --  stably improved; RPE mottling, clumping and atrophy, +  GA surrounding fovea and extending to disc, +central pigment clumping, no heme   Vessels Mild Vascular attenuation, mild tortuousity Mild Vascular attenuation   Periphery Attached, reticular degeneration, No heme  Attached, reticular degeneration, flat, round pigmented nevus at 1200 (~1.5DD)            IMAGING AND PROCEDURES  Imaging and Procedures for  @TODAY @  OCT, Retina - OU - Both Eyes       Right Eye Quality was borderline. Central Foveal Thickness: 336. Progression has been stable. Findings include abnormal foveal contour, outer retinal atrophy, subretinal hyper-reflective material, pigment epithelial detachment, retinal drusen , no SRF, no IRF (Persistent ORA / SRHM).   Left Eye Quality was borderline. Central Foveal Thickness: 244. Progression has been stable. Findings include normal foveal contour, no SRF, outer retinal atrophy, retinal drusen , pigment epithelial detachment, subretinal hyper-reflective material, no IRF (stable improvement in IRF; persistent diffuse ORA, partial PVD).   Notes *Images captured and stored on drive  Diagnosis / Impression:  OD: Non-exu ARMD with persistent ORA / SRHM OS: exudative ARMD w/ stable improvement in IRF; persistent diffuse ORA  Clinical management:  See below  Abbreviations: NFP - Normal foveal profile. CME - cystoid macular edema. PED - pigment epithelial detachment. IRF - intraretinal fluid. SRF - subretinal fluid. EZ - ellipsoid zone. ERM - epiretinal membrane. ORA - outer retinal atrophy. ORT - outer retinal tubulation. SRHM - subretinal hyper-reflective material      Intravitreal Injection, Pharmacologic Agent - OS - Left Eye       Time Out 08/22/2020. 8:26 AM. Confirmed correct patient, procedure, site, and patient consented.   Anesthesia Topical anesthesia was used. Anesthetic medications included Lidocaine 2%, Proparacaine 0.5%.   Procedure Preparation included 5% betadine to ocular surface, eyelid speculum. A (32g) needle was used.   Injection: 1.25 mg Bevacizumab 1.25mg /0.54ml   Route: Intravitreal, Site: Left Eye   NDC: H061816, Lot: [id number], Expiration date: 10/11/2020, Waste: 0.05 mL   Post-op Post injection exam found visual acuity of at least counting fingers. The patient tolerated the procedure well. There were no complications. The patient received  written and verbal post procedure care education. Post injection medications were not given.               ASSESSMENT/PLAN:    ICD-10-CM   1. Exudative age-related macular degeneration of left eye with active choroidal neovascularization (HCC)  H35.3221 Intravitreal Injection, Pharmacologic Agent - OS - Left Eye    Bevacizumab (AVASTIN) SOLN 1.25 mg    2. Retinal edema  H35.81 OCT, Retina - OU - Both Eyes    3. 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 Essential hypertension  I10     5. Hypertensive retinopathy of both eyes  H35.033     6. Pseudophakia of both eyes  Z96.1     7. Dislocation of intraocular lens, sequela  T85.22XS     8. 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 Exudative age related macular degeneration OS  - OS with interval conversion to exu ARMD noted on 04.26.21  - s/p IVA OS #1 (04.26.21), #2 (05.26.21), #3 (06.24.21), #4 (08.05.21), #5 (09.16.21), #6 (10.28.21), #7 (12.16.21), #8 (2.10.22), #9 (04.14.22)  - BCVA 20/30 -- stable  - OCT shows persistent ORA / SRHM -- no IRF/SRF -- stably  improved at 10 wks  - recommend IVA OS #10 today, 06.23.22, maintenance w/ ext to 11 wks  - pt wishes to proceed with IVA  - RBA of procedure discussed, questions answered  - informed consent obtained and signed  - see procedure note   - Avastin informed consent form signed and scanned on 04.26.2021  - f/u 11 wks, DFE, OCT, possible injection - tx and ext slowly as able (functionally monocular)  3. Nonexudative ARMD OD  - OD advanced stage with foveal involvement -- BCVA CF 3' from geographic atrophy Age related macular degeneration, non-exudative, both eyes  - The incidence, anatomy, and pathology of dry AMD, risk of progression, and the AREDS and AREDS 2 study including smoking risks discussed with patient.  - Recommend amsler grid  monitoring   - f/u in 11 weeks  4,5. Hypertensive retinopathy OU  - discussed importance of tight BP control  - monitor   6-8. Pseudophakia OU w/ dislocated IOL and vitreous prolapse OD  - s/p CE/IOL OU (Dr. Dolores Lory)  - OS: 3-piece IOL in good position   - OD: 3-piece IOL dislocated inferiorly with mild vitreous prolapse temporal pupil -- IOL relatively stable  - OD with vision limited by advanced ARMD -- no intervention recommended at this time  - recommend monitoring for now -- may need PPV / IOL exchange if IOL dislocates completely   9. Choroidal nevus OS  - flat, pigmented round lesion at 12 oclock periphery  - 1.5DD in size; no SRF or orange pigment  - monitor     Ophthalmic Meds Ordered this visit:  Meds ordered this encounter  Medications   Bevacizumab (AVASTIN) SOLN 1.25 mg       Return in about 11 weeks (around 11/07/2020) for f/u exu ARMD OS, DFE, OCT.  There are no Patient Instructions on file for this visit.   This document serves as a record of services personally performed by Gardiner Sleeper, MD, PhD. It was created on their behalf by Leonie Douglas, an ophthalmic technician. The creation of this record is the provider's dictation and/or activities during the visit.    Electronically signed by: Leonie Douglas COA, 08/22/20  8:42 AM  This document serves as a record of services personally performed by Gardiner Sleeper, MD, PhD. It was created on their behalf by San Jetty. Owens Shark, OA an ophthalmic technician. The creation of this record is the provider's dictation and/or activities during the visit.    Electronically signed by: San Jetty. Owens Shark, [place] 06.23.2022 8:42 AM   Gardiner Sleeper, M.D., Ph.D. Diseases & Surgery of the Retina and [place] 08/22/2020   I have reviewed the above documentation for accuracy and completeness, and I agree with the above. Gardiner Sleeper, M.D., Ph.D. 08/22/20 8:43 AM   Abbreviations: M myopia  (nearsighted); A astigmatism; H hyperopia (farsighted); P presbyopia; Mrx spectacle prescription;  CTL contact lenses; OD right eye; OS left eye; OU both eyes  XT exotropia; ET esotropia; PEK punctate epithelial keratitis; PEE punctate epithelial erosions; DES dry eye syndrome; MGD meibomian gland dysfunction; ATs artificial tears; PFAT's preservative free artificial tears; [place] nuclear sclerotic cataract; PSC posterior subcapsular cataract; ERM epi-retinal membrane; PVD posterior vitreous detachment; RD retinal detachment; DM diabetes mellitus; DR diabetic retinopathy; NPDR non-proliferative diabetic retinopathy; PDR proliferative diabetic retinopathy; CSME clinically significant macular edema; DME diabetic macular edema; dbh dot blot hemorrhages; CWS cotton wool spot; POAG primary open angle glaucoma; C/D cup-to-disc ratio; HVF  humphrey visual field; GVF goldmann visual field; OCT optical coherence tomography; IOP intraocular pressure; BRVO Branch retinal vein occlusion; CRVO central retinal vein occlusion; CRAO central retinal artery occlusion; BRAO branch retinal artery occlusion; RT retinal tear; SB scleral buckle; PPV pars plana vitrectomy; VH Vitreous hemorrhage; PRP panretinal laser photocoagulation; IVK intravitreal kenalog; VMT vitreomacular traction; MH Macular hole;  NVD neovascularization of the disc; NVE neovascularization elsewhere; AREDS age related eye disease study; ARMD age related macular degeneration; POAG primary open angle glaucoma; EBMD epithelial/anterior basement membrane dystrophy; ACIOL anterior chamber intraocular lens; IOL intraocular lens; PCIOL posterior chamber intraocular lens; Phaco/IOL phacoemulsification with intraocular lens placement; [place] photorefractive keratectomy; LASIK laser assisted in situ keratomileusis; HTN hypertension; DM diabetes mellitus; COPD chronic obstructive pulmonary disease

## 2020-08-22 ENCOUNTER — Other Ambulatory Visit: Payer: Self-pay

## 2020-08-22 ENCOUNTER — Encounter (INDEPENDENT_AMBULATORY_CARE_PROVIDER_SITE_OTHER): Payer: Self-pay | Admitting: Ophthalmology

## 2020-08-22 ENCOUNTER — Ambulatory Visit (INDEPENDENT_AMBULATORY_CARE_PROVIDER_SITE_OTHER): Payer: Medicare Other | Admitting: Ophthalmology

## 2020-08-22 DIAGNOSIS — I10 Essential (primary) hypertension: Secondary | ICD-10-CM

## 2020-08-22 DIAGNOSIS — H35033 Hypertensive retinopathy, bilateral: Secondary | ICD-10-CM

## 2020-08-22 DIAGNOSIS — H3581 Retinal edema: Secondary | ICD-10-CM

## 2020-08-22 DIAGNOSIS — Z961 Presence of intraocular lens: Secondary | ICD-10-CM

## 2020-08-22 DIAGNOSIS — T8522XS Displacement of intraocular lens, sequela: Secondary | ICD-10-CM

## 2020-08-22 DIAGNOSIS — H353221 Exudative age-related macular degeneration, left eye, with active choroidal neovascularization: Secondary | ICD-10-CM

## 2020-08-22 DIAGNOSIS — H4301 Vitreous prolapse, right eye: Secondary | ICD-10-CM

## 2020-08-22 DIAGNOSIS — H353114 Nonexudative age-related macular degeneration, right eye, advanced atrophic with subfoveal involvement: Secondary | ICD-10-CM

## 2020-08-22 DIAGNOSIS — D3132 Benign neoplasm of left choroid: Secondary | ICD-10-CM

## 2020-08-22 MED ORDER — BEVACIZUMAB CHEMO INJECTION 1.25MG/0.05ML SYRINGE FOR KALEIDOSCOPE
1.2500 mg | INTRAVITREAL | Status: AC | PRN
  Administered 2020-08-22: 1.25 mg via INTRAVITREAL

## 2020-08-26 ENCOUNTER — Other Ambulatory Visit: Payer: Self-pay | Admitting: Cardiology

## 2020-09-01 ENCOUNTER — Other Ambulatory Visit: Payer: Self-pay | Admitting: Cardiology

## 2020-09-05 ENCOUNTER — Other Ambulatory Visit: Payer: Self-pay | Admitting: Cardiology

## 2020-09-30 ENCOUNTER — Other Ambulatory Visit: Payer: Self-pay | Admitting: Cardiology

## 2020-10-01 LAB — LIPID PANEL
Chol/HDL Ratio: 2.1 ratio (ref 0.0–5.0)
Cholesterol, Total: 98 mg/dL — ABNORMAL LOW (ref 100–199)
HDL: 47 mg/dL (ref >39)
LDL Chol Calc (NIH): 37 mg/dL (ref 0–99)
Triglycerides: 62 mg/dL (ref 0–149)
VLDL Cholesterol Cal: 14 mg/dL (ref 5–40)

## 2020-10-02 LAB — HEPATIC FUNCTION PANEL
ALT: 8 IU/L (ref 0–44)
AST: 12 IU/L (ref 0–40)
Albumin: 5.1 g/dL — ABNORMAL HIGH (ref 3.6–4.6)
Alkaline Phosphatase: 90 IU/L (ref 44–121)
Bilirubin Total: 0.5 mg/dL (ref 0.0–1.2)
Bilirubin, Direct: 0.18 mg/dL (ref 0.00–0.40)
Total Protein: 6.8 g/dL (ref 6.0–8.5)

## 2020-10-03 ENCOUNTER — Telehealth: Payer: Self-pay | Admitting: Cardiology

## 2020-10-03 NOTE — Telephone Encounter (Signed)
Clarene Critchley, Daughter of the patient was returning a call to go over the patient's lab results.

## 2020-10-03 NOTE — Telephone Encounter (Signed)
Nurse called pt back at listed number, pt's daughter [name] answered. Pt's daughter is not included on DPR. Nurse advised pt's daughter that she can be added to DPR in the future if her father is agreeable, and until then results could only be relayed to pt and those included on DPR. Nurse told pt's daughter he could call back in for results and that results could be sent to him in the mail. Pt's daughter verbalized understanding. This RN to send pt results in mail to listed address.    [name], MD  10/02/2020  8:22 PM EDT      Lab results: Liver function tests look stable.  Normal.   Overall cholesterol level looks much better. Total cholesterol is down to 98 and LDL is down to 37 from 85 having switched from pravastatin to rosuvastatin.  This is outstanding.  As [place]orman as he istolerating well, would continue current dose for now.  Can reassess in 6 months.   Glenetta Hew, MD

## 2020-10-30 NOTE — Progress Notes (Signed)
[facility] Note  11/07/2020     CHIEF COMPLAINT Patient presents for Retina Follow Up   HISTORY OF PRESENT ILLNESS: Xavier Giles is a 80 y.o. 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  Duration of 11 weeks.  Since onset it is stable.  I, the attending physician,  performed the HPI with the patient and updated documentation appropriately.        Comments   11 week follow up ARMD OS- Vision seems about the same.        Last edited by Bernarda Caffey, MD on 11/07/2020  8:14 AM.    pt reports no change in vision   Referring physician: No referring provider defined for this encounter.  HISTORICAL INFORMATION:   Selected notes from the MEDICAL RECORD NUMBER Referred by Dr. Parke Simmers for concern of ARMD OU   CURRENT MEDICATIONS: No current outpatient medications on file. (Ophthalmic Drugs)   No current facility-administered medications for this visit. (Ophthalmic Drugs)   Current Outpatient Medications (Other)  Medication Sig   amLODipine-benazepril (LOTREL) 10-20 MG capsule TAKE 1 CAPSULE BY MOUTH DAILY   atenolol (TENORMIN) 50 MG tablet Take 1 tablet by mouth once daily   isosorbide mononitrate (IMDUR) 60 MG 24 hr tablet TAKE 1 TABLET BY MOUTH ONCE DAILY.   nitroGLYCERIN (NITROSTAT) 0.4 MG SL tablet Place 1 tablet (0.4 mg total) under the tongue every 5 (five) minutes as needed (up to 3 doses).   pravastatin (PRAVACHOL) 40 MG tablet Take 1 tablet (40 mg total) by mouth daily.   rosuvastatin (CRESTOR) 20 MG tablet Take 1 tablet by mouth once daily   ticagrelor (BRILINTA) 60 MG TABS tablet Take 1 tablet (60 mg total) by mouth 2 (two) times daily.   No current facility-administered medications for this visit. (Other)   REVIEW OF SYSTEMS: ROS   Positive for: Musculoskeletal, Cardiovascular, Eyes, Respiratory Negative for: Constitutional, Gastrointestinal, Neurological,  Skin, Genitourinary, HENT, Endocrine, Psychiatric, Allergic/Imm, Heme/Lymph Last edited by Leonie Douglas, COA on 11/07/2020  7:46 AM.      ALLERGIES Allergies  Allergen Reactions   Keflex [Cephalexin] Other (See Comments)    Unknown    Zocor [Simvastatin] Other (See Comments)    Unknown    Lipitor [Atorvastatin] Other (See Comments)    Elevated blood sugar   Sulfa Antibiotics Nausea Only    PAST MEDICAL HISTORY Past Medical History:  Diagnosis Date   Asthma    CAD (coronary artery disease) of bypass graft 2003   (now known CTO of SVG-rPDA & SVG-D1 & 3 stents in SeqSVg-Om1-OM2 (1 in prox leg, overlapping DES-BMS from OM1-OM2 into OM2 - s/p PTCA for ISR).; LIMA-LAD patent    CAD S/P PTCA-PCI of SeqSVG-OM1-OM2 2003; 01/2012; 05/2013   a) '03 SeqSVG-OM1-OM2,  Zeta BMS 3.0x8 Seq limb into OM2), & PTCA of SVg-D1 anastomosis; b) 2013: Distal Limb of SeqSVG-OM1-OM2 from OM1-2 overlap BMS ->Xience DES 2.75 x 15 - 76m;; c) 05/2013 Proxlimb SVG--OM1-OM2 -> Resolute DES 3 x 26 - 3.252m Angiosculpt PTCA of ISR in Seq Limb overlaping stents (3.25 mm)   CAD, multiple vessel - Native CAD. 1998; 2015   a) Referred for CABGx5 ; by Cath 05/2013: dLM 60%, Ost LAD 70&. Prox -mid LAD extensive 70%( to-fro flow); D1 80%; OM1 & OM2 (CTO), follow-on LCx beyond AVG 90+% with small OM3/OM4; RCA  TO (R-R collaterals);   Carotid bruit present 01/2012   a. 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 Also noted CTO of SVG-Diag; b) CRESCENDO ANGINA: 05/2013 - prox  limb SVG-OM1-OM2 DES PCI & PTCA of 95% ISR in distal Limb overlapping DES-BMS; Myoview 08/25/13 - small non-T-mural Inf scar, No Ischemia   S/P CABG x 5 1998   LIMA-LAD, SeqSVG-OM1-OM2; SVG-rPDA, SVG-D1    Past Surgical History:  Procedure Laterality Date   CATARACT EXTRACTION Bilateral    Dr. Dolores Lory   CATARACT EXTRACTION W/ INTRAOCULAR LENS  IMPLANT, BILATERAL Bilateral    CORONARY ANGIOPLASTY WITH STENT PLACEMENT  2003   DR LITTLE: BMS 3.0 mm x 8 mm stent to Seq limb anastomosis of SeqSVG-OM1-OM2 into OM2; & cutting balloon SVG-Diag   CORONARY ANGIOPLASTY WITH STENT PLACEMENT  06/28/2013   Procedure: CORONARY ANGIOPLASTY WITH STENT PLACEMENT ;  Surgeon: Troy Sine, MD;  Location: [facility] CATH LAB;  Service: Cardiovascular: PCI to SVG-CxOM1-OM2 99% prox lesion (Resolute DES 3.0 x 26 - 3.4 mm) & PTCA of  95% ISR in sequential limb (3.0 mm Angiosculpt)    CORONARY ARTERY BYPASS GRAFT  1998   SVG-diagonal, DVG-OM, DVG-PDA, LIMA-LAD   CORONARY STENT INTERVENTION  01/13/2012     Procedure: CORONARY STENT INTERVENTION ;  Surgeon: Lorretta Harp, MD;  Location: [facility] CATH LAB;  Service: Cardiovascular: PCI Seq limb of Seq SVG-OM1-OM2 90% -> 0% - Xience Xpedition DES 2.75 MM x15 MM (3 MM) overlaps prox edge of BMS into OM2   ESOPHAGOGASTRODUODENOSCOPY N/A 06/29/2013   Procedure: ESOPHAGOGASTRODUODENOSCOPY (EGD);  Surgeon: Beryle Beams, MD;  Location: [facility] ENDOSCOPY;  Service: Endoscopy;  Laterality: N/A;   EYE SURGERY Bilateral    Cat Sx - Dr. Dolores Lory   LEFT HEART CATH AND CORS/GRAFTS ANGIOGRAPHY  2003   ABN CARDIOLITE:  100% SVG-PDA, patent LIMA; 80% anastomotic lesion SVG-Diag; 99% anastomotic lesion of Seq limb SVG-OM1-OM2; Native LM OK - 50% & 80% prox LAD, 80% D1; Native OM1 & OM2 TO; Native RCA CTO (R_R collaterals). => PCI of SVG-OM2 & PTCA of SVG-Diag   LEFT HEART CATHETERIZATION WITH CORONARY/GRAFT ANGIOGRAM N/A 01/13/2012   Procedure: LEFT HEART CATHETERIZATION WITH Beatrix Fetters;   Surgeon: Lorretta Harp, MD;  Location: [facility] CATH LAB;  Service: Cardiovascular: SVG-D1 CTO (new), knosn SVG-RCA CTO.  Native mRCA CTO (R-R collaterals); 50% distal LM, 70-80% prox LAD and 90% mid.  LIMA-LAD patent w/  70% post anastomosis. OM1 &OM2 CTO. Seq limb of SeqSVG-OM1-OM2 90% @ OM1 to BMS into OM2.   LEFT HEART CATHETERIZATION WITH CORONARY/GRAFT ANGIOGRAM N/A 06/28/2013   Procedure: LEFT HEART CATHETERIZATION WITH Beatrix Fetters;  Surgeon: Troy Sine, MD;  Location: [facility] CATH LAB;  Service: Cardiovascular: Cres Angina: a) 60% dLM; 70% Ostial LAD; 90% Cx - no OMs seen; 100% RCA;; SVG-D1 & SVG--dRCA occluded; Patent LIMA-LAD with dLAD ~70-80%;; SeqSVG-OM1-OM2: 95% proximal limb, 50% mid & Diffuse ISR in overlapping DES/BMS in Seq Limb OM-OM2   NM MYOVIEW  LTD  07/2013   Test looked good!! Evidence of possible old MI / scar, but nothing to suggest ischemia. Not Gated b/c ectopy .   NM MYOVIEW LTD  06/2017   Intermediate risk because of reduced EF but stable.  Stable fixed inferior defect consistent with either diaphragmatic attenuation or inferior infarct.  Septal hypokinesis/dyskinesis due to LBBB and prior CABG.  EF is 46%.  Stable compared to 2015 following PCI   TONSILLECTOMY     FAMILY HISTORY Family History  Problem Relation Age of Onset   Cancer Mother    Heart attack Father    SOCIAL HISTORY Social History   Tobacco Use   Smoking status: Former    Packs/day: 2.00    Years: 30.00    Pack years: 60.00    Types: Cigarettes    Quit date: 03/02/1990    Years since quitting: 30.7   Smokeless tobacco: Never  Substance Use Topics   Alcohol use: Yes    Comment: 06/28/2013 "aien't drank in years; used to drink a little beer"   Drug use: No         OPHTHALMIC EXAM:  Base Eye Exam     Visual Acuity (Snellen - Linear)       Right Left   Dist cc CF 3' 20/50 -2   Dist ph cc  NI         Tonometry (Tonopen, 7:57 AM)       Right Left   Pressure 16 13          Pupils       Dark Light Shape React APD   Right 3 2 Round Brisk None   Left 3 2 Round Brisk None  Hippus OD        Visual Fields (Counting fingers)       Left Right    Full Full         Extraocular Movement       Right Left    Full Full         Neuro/Psych     Oriented x3: Yes   Mood/Affect: Normal         Dilation     Both eyes: 1.0% Mydriacyl, 2.5% Phenylephrine @ 7:57 AM           Slit Lamp and Fundus Exam     Slit Lamp Exam       Right Left   Lids/Lashes Dermatochalasis - upper lid Dermatochalasis - upper lid   Conjunctiva/Sclera White and quiet White and quiet   Cornea 1+ Punctate epithelial erosions, Arcus, 1+endopigment 2-3+ Punctate epithelial erosions, Arcus, 1+endopigment, irregular epi, decreased TBUT   Anterior Chamber deep, 0.5+pigment, mild pigmented vitreous prolapse temporal pupil Deep and quiet   Iris Round and dilated Round and dilated   Lens 3 piece PC IOL displaced inferiorly 3 piece PC IOL in good position   Vitreous Vitreous syneresis, Posterior vitreous detachment, vitreous condensations Vitreous syneresis, Posterior vitreous detachment         Fundus Exam       Right Left   Disc mild Pallor, Sharp rim, PPA/PPP mild Pallor, Sharp rim, +PPA   C/D Ratio 0.3 0.3   Macula Flat, Blunted foveal reflex, Drusen, RPE mottling, clumping and atrophy -- prominent central pigment clumping Flat, Blunted foveal reflex, Drusen, cystic changes --  stably improved; RPE mottling, clumping and atrophy, +GA surrounding fovea and extending to disc, +central pigment clumping, no heme   Vessels Mild Vascular attenuation, mild  tortuousity Mild Vascular attenuation   Periphery Attached, reticular degeneration, No heme  Attached, reticular degeneration, flat, round pigmented nevus at 1200 (~1.5DD)           Refraction     Wearing Rx       Sphere Cylinder Axis Add   Right +2.25 +1.50 008 +2.75   Left [place] +2.00 162 +2.75          Manifest Refraction       Sphere Cylinder Axis Dist VA   Right       Left -1.25 +1.75 170 20/40            IMAGING AND PROCEDURES  Imaging and Procedures for '@TODAY'$ @  OCT, Retina - OU - Both Eyes       Right Eye Quality was good. Central Foveal Thickness: 207. Progression has been stable. Findings include abnormal foveal contour, outer retinal atrophy, subretinal hyper-reflective material, pigment epithelial detachment, retinal drusen , no SRF, no IRF (Persistent ORA / SRHM).   Left Eye Quality was good. Central Foveal Thickness: 226. Progression has been stable. Findings include normal foveal contour, no SRF, outer retinal atrophy, retinal drusen , pigment epithelial detachment, subretinal hyper-reflective material, no IRF (stable improvement in IRF; persistent diffuse ORA, partial PVD).   Notes *Images captured and stored on drive  Diagnosis / Impression:  OD: Non-exu ARMD with persistent ORA / SRHM OS: exudative ARMD w/ stable improvement in IRF; persistent diffuse ORA  Clinical management:  See below  Abbreviations: NFP - Normal foveal profile. CME - cystoid macular edema. PED - pigment epithelial detachment. IRF - intraretinal fluid. SRF - subretinal fluid. EZ - ellipsoid zone. ERM - epiretinal membrane. ORA - outer retinal atrophy. ORT - outer retinal tubulation. SRHM - subretinal hyper-reflective material      Intravitreal Injection, Pharmacologic Agent - OS - Left Eye       Time Out 11/07/2020. 8:08 AM. Confirmed correct patient, procedure, site, and patient consented.   Anesthesia Topical anesthesia was used. Anesthetic medications included Lidocaine 2%, Proparacaine 0.5%.   Procedure Preparation included 5% betadine to ocular surface, eyelid speculum. A supplied (32g) needle was used.   Injection: 1.25 mg Bevacizumab 1.'25mg'$ /0.35m   Route: Intravitreal, Site: Left Eye   NDC:[id number] Lot: [id number]'@11'$ , Expiration date: 11/27/2020, Waste: 0 mL    Post-op Post injection exam found visual acuity of at least counting fingers. The patient tolerated the procedure well. There were no complications. The patient received written and verbal post procedure care education. Post injection medications were not given.            ASSESSMENT/PLAN:    ICD-10-CM   1. Exudative age-related macular degeneration of left eye with active choroidal neovascularization (HCC)  H35.3221 Intravitreal Injection, Pharmacologic Agent - OS - Left Eye    Bevacizumab (AVASTIN) SOLN 1.25 mg    2. Retinal edema  H35.81 OCT, Retina - OU - Both Eyes    3. 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 Essential hypertension  I10     5. Hypertensive retinopathy of both eyes  H35.033     6. Pseudophakia of both eyes  Z96.1     7. Dislocation of intraocular lens, sequela  T85.22XS     8. 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 Exudative age related macular degeneration  OS  - OS with interval conversion to exu ARMD noted on 04.26.21  - s/p IVA OS #1 (04.26.21), #2 (05.26.21), #3 (06.24.21), #4 (08.05.21), #5 (09.16.21), #6 (10.28.21), #7 (12.16.21), #8 (2.10.22), #9 (04.14.22)  - BCVA 20/40 -- (Mrx) stable  - OCT shows persistent ORA / SRHM -- no IRF/SRF -- stably improved at 11 wks  - recommend IVA OS #10 today, 06.23.22, maintenance w/ ext to 12 wks  - pt wishes to proceed with IVA  - RBA of procedure discussed, questions answered  - informed consent obtained and signed  - see procedure note   - Avastin informed consent form signed and scanned on 04.26.2021  - f/u 12 wks, DFE, OCT, possible injection - tx and ext slowly as able (functionally monocular)  3. Nonexudative ARMD OD  - OD advanced stage with foveal involvement -- BCVA CF 3' from geographic atrophy Age related macular degeneration, non-exudative, both eyes  -  The incidence, anatomy, and pathology of dry AMD, risk of progression, and the AREDS and AREDS 2 study including smoking risks discussed with patient.  - Recommend amsler grid monitoring   - f/u in 12 weeks  4,5. Hypertensive retinopathy OU  - discussed importance of tight BP control  - monitor   6-8. Pseudophakia OU w/ dislocated IOL and vitreous prolapse OD  - s/p CE/IOL OU (Dr. Dolores Lory)  - OS: 3-piece IOL in good position   - OD: 3-piece IOL dislocated inferiorly with mild vitreous prolapse temporal pupil -- IOL relatively stable  - OD with vision limited by advanced ARMD -- no intervention recommended at this time  - recommend monitoring for now -- may need PPV / IOL exchange if IOL dislocates completely   9. Choroidal nevus OS  - flat, pigmented round lesion at 12 oclock periphery  - 1.5DD in size; no SRF or orange pigment  - monitor     Ophthalmic Meds Ordered this visit:  Meds ordered this encounter  Medications   Bevacizumab (AVASTIN) SOLN 1.25 mg       Return in about 12 weeks (around 01/30/2021) for ex ARMD OS, Dilated Exam, OCT, Possible Injxn.  There are no Patient Instructions on file for this visit.   This document serves as a record of services personally performed by Gardiner Sleeper, MD, PhD. It was created on their behalf by San Jetty. Owens Shark, OA an ophthalmic technician. The creation of this record is the provider's dictation and/or activities during the visit.    Electronically signed by: San Jetty. Owens Shark, [place] 08.31.2022 8:32 AM  Gardiner Sleeper, M.D., Ph.D. Diseases & Surgery of the Retina and Vitreous [name]  I have reviewed the above documentation for accuracy and completeness, and I agree with the above. Gardiner Sleeper, M.D., Ph.D. 11/07/20 8:32 AM  Abbreviations: M myopia (nearsighted); A astigmatism; H hyperopia (farsighted); P presbyopia; Mrx spectacle prescription;  CTL contact lenses; OD right eye; OS left eye; OU both eyes   XT exotropia; ET esotropia; PEK punctate epithelial keratitis; PEE punctate epithelial erosions; DES dry eye syndrome; MGD meibomian gland dysfunction; ATs artificial tears; PFAT's preservative free artificial tears; [place] nuclear sclerotic cataract; PSC posterior subcapsular cataract; ERM epi-retinal membrane; PVD posterior vitreous detachment; RD retinal detachment; DM diabetes mellitus; DR diabetic retinopathy; NPDR non-proliferative diabetic retinopathy; PDR proliferative diabetic retinopathy; CSME clinically significant macular edema; DME diabetic macular edema; dbh dot blot hemorrhages; CWS cotton wool spot; POAG primary open angle glaucoma; C/D cup-to-disc ratio; HVF humphrey visual  field; GVF goldmann visual field; OCT optical coherence tomography; IOP intraocular pressure; BRVO Branch retinal vein occlusion; CRVO central retinal vein occlusion; CRAO central retinal artery occlusion; BRAO branch retinal artery occlusion; RT retinal tear; SB scleral buckle; PPV pars plana vitrectomy; VH Vitreous hemorrhage; PRP panretinal laser photocoagulation; IVK intravitreal kenalog; VMT vitreomacular traction; MH Macular hole;  NVD neovascularization of the disc; NVE neovascularization elsewhere; AREDS age related eye disease study; ARMD age related macular degeneration; POAG primary open angle glaucoma; EBMD epithelial/anterior basement membrane dystrophy; ACIOL anterior chamber intraocular lens; IOL intraocular lens; PCIOL posterior chamber intraocular lens; Phaco/IOL phacoemulsification with intraocular lens placement; [place] photorefractive keratectomy; LASIK laser assisted in situ keratomileusis; HTN hypertension; DM diabetes mellitus; COPD chronic obstructive pulmonary disease

## 2020-11-07 ENCOUNTER — Ambulatory Visit (INDEPENDENT_AMBULATORY_CARE_PROVIDER_SITE_OTHER): Payer: Medicare Other | Admitting: Ophthalmology

## 2020-11-07 ENCOUNTER — Other Ambulatory Visit: Payer: Self-pay

## 2020-11-07 ENCOUNTER — Encounter (INDEPENDENT_AMBULATORY_CARE_PROVIDER_SITE_OTHER): Payer: Self-pay | Admitting: Ophthalmology

## 2020-11-07 DIAGNOSIS — H4301 Vitreous prolapse, right eye: Secondary | ICD-10-CM

## 2020-11-07 DIAGNOSIS — H353221 Exudative age-related macular degeneration, left eye, with active choroidal neovascularization: Secondary | ICD-10-CM | POA: Diagnosis not present

## 2020-11-07 DIAGNOSIS — Z961 Presence of intraocular lens: Secondary | ICD-10-CM

## 2020-11-07 DIAGNOSIS — H3581 Retinal edema: Secondary | ICD-10-CM | POA: Diagnosis not present

## 2020-11-07 DIAGNOSIS — I10 Essential (primary) hypertension: Secondary | ICD-10-CM

## 2020-11-07 DIAGNOSIS — T8522XS Displacement of intraocular lens, sequela: Secondary | ICD-10-CM

## 2020-11-07 DIAGNOSIS — H35033 Hypertensive retinopathy, bilateral: Secondary | ICD-10-CM

## 2020-11-07 DIAGNOSIS — H353114 Nonexudative age-related macular degeneration, right eye, advanced atrophic with subfoveal involvement: Secondary | ICD-10-CM

## 2020-11-07 DIAGNOSIS — D3132 Benign neoplasm of left choroid: Secondary | ICD-10-CM

## 2020-11-07 MED ORDER — BEVACIZUMAB CHEMO INJECTION 1.25MG/0.05ML SYRINGE FOR KALEIDOSCOPE
1.2500 mg | INTRAVITREAL | Status: AC | PRN
  Administered 2020-11-07: 1.25 mg via INTRAVITREAL

## 2020-12-02 ENCOUNTER — Other Ambulatory Visit: Payer: Self-pay | Admitting: Cardiology

## 2020-12-24 ENCOUNTER — Other Ambulatory Visit: Payer: Self-pay | Admitting: Cardiology

## 2021-01-08 ENCOUNTER — Telehealth: Payer: Self-pay | Admitting: Pharmacy Technician

## 2021-01-08 DIAGNOSIS — Z596 Low income: Secondary | ICD-10-CM

## 2021-01-08 NOTE — Progress Notes (Signed)
[facility])                                            [place] Team    01/08/2021  Xavier Giles 23-Mar-1940 [id number]  FOR 2023 RE ENROLLMENT                                      Medication Assistance Referral  Referral From: [place]  Medication/Company: [name] / [place] Patient application portion:  Mailed Provider application portion: Faxed  to Dr. Glenetta Hew Provider address/fax verified via: Office website   [name] P. [name], [name]  [phone]

## 2021-01-27 ENCOUNTER — Other Ambulatory Visit: Payer: Self-pay | Admitting: Cardiology

## 2021-01-27 NOTE — Progress Notes (Signed)
[facility] Note  01/30/2021     CHIEF COMPLAINT Patient presents for Retina Follow Up   HISTORY OF PRESENT ILLNESS: Xavier Giles is a 80 y.o. 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  Severity is moderate.  Duration of 12 weeks.  Since onset it is stable.  I, the attending physician,  performed the HPI with the patient and updated documentation appropriately.        Comments   Pt here for 12 wk ret f/u for exu ARMD OS. Pt states vision is the same, no changes. Pt is very hard of hearing.       Last edited by Bernarda Caffey, MD on 01/30/2021  9:35 AM.    pt reports no change in vision and no new health concerns   Referring physician: No referring provider defined for this encounter.  HISTORICAL INFORMATION:   Selected notes from the MEDICAL RECORD NUMBER Referred by Dr. Parke Simmers for concern of ARMD OU   CURRENT MEDICATIONS: No current outpatient medications on file. (Ophthalmic Drugs)   No current facility-administered medications for this visit. (Ophthalmic Drugs)   Current Outpatient Medications (Other)  Medication Sig   amLODipine-benazepril (LOTREL) 10-20 MG capsule TAKE 1 CAPSULE BY MOUTH DAILY   atenolol (TENORMIN) 50 MG tablet TAKE 1 TABLET BY MOUTH ONCE DAILY. PLEASE SCHEDULE AN APPT FOR FUTURE REFILLS.   isosorbide mononitrate (IMDUR) 60 MG 24 hr tablet Take 1 tablet by mouth once daily   nitroGLYCERIN (NITROSTAT) 0.4 MG SL tablet Place 1 tablet (0.4 mg total) under the tongue every 5 (five) minutes as needed (up to 3 doses).   pravastatin (PRAVACHOL) 40 MG tablet Take 1 tablet (40 mg total) by mouth daily.   rosuvastatin (CRESTOR) 20 MG tablet Take 1 tablet by mouth once daily   ticagrelor (BRILINTA) 60 MG TABS tablet Take 1 tablet (60 mg total) by mouth 2 (two) times daily.   No current facility-administered medications for this visit. (Other)    REVIEW OF SYSTEMS: ROS   Positive for: Musculoskeletal, Cardiovascular, Eyes, Respiratory Negative for: Constitutional, Gastrointestinal, Neurological, Skin, Genitourinary, HENT, Endocrine, Psychiatric, Allergic/Imm, Heme/Lymph Last edited by Kingsley Spittle, COT on 01/30/2021  7:48 AM.       ALLERGIES Allergies  Allergen Reactions   Keflex [Cephalexin] Other (See Comments)    Unknown    Zocor [Simvastatin] Other (See Comments)    Unknown    Lipitor [Atorvastatin] Other (See Comments)    Elevated blood sugar   Sulfa Antibiotics Nausea Only    PAST MEDICAL HISTORY Past Medical History:  Diagnosis Date   Asthma    CAD (coronary artery disease) of bypass graft 2003   (now known CTO of SVG-rPDA & SVG-D1 & 3 stents in SeqSVg-Om1-OM2 (1 in prox leg, overlapping DES-BMS from OM1-OM2 into OM2 - s/p PTCA for ISR).; LIMA-LAD patent    CAD S/P PTCA-PCI of SeqSVG-OM1-OM2 2003; 01/2012; 05/2013   a) '03 SeqSVG-OM1-OM2,  Zeta BMS 3.0x8 Seq limb into OM2), & PTCA of SVg-D1 anastomosis; b) 2013: Distal Limb of SeqSVG-OM1-OM2 from OM1-2 overlap BMS ->Xience DES 2.75 x 15 - 20m;; c) 05/2013 Proxlimb SVG--OM1-OM2 -> Resolute DES 3 x 26 - 3.268m Angiosculpt PTCA of ISR in Seq Limb overlaping stents (3.25 mm)   CAD, multiple vessel - Native CAD. 1998; 2015   a) Referred for CABGx5 ;  by Cath 05/2013: dLM 60%, Ost LAD 70&. Prox -mid LAD extensive 70%( to-fro flow); D1 80%; OM1 & OM2 (CTO), follow-on LCx beyond AVG 90+% with small OM3/OM4; RCA TO (R-R collaterals);   Carotid bruit present 01/2012   a. asymptomatic; carotid doppler 01/2012 - R bulb/prox ICA mild-mod amt of fibrous plaque, L subclavian 70-99% diameter reduction, L bulb/ICA mild amt of fibrous plaque   Chronic low back pain    Dyslipidemia, goal LDL below 70    Hypertension, essential    Hypertensive retinopathy    OU   Macular degeneration    Dry OD, Wet OS   Non-ST elevated myocardial infarction (non-STEMI) / Crescendo Angina  01/2012   Cath noted 90% stenosis from OM1-OM2 in Seq Limb of SVG-OM1-OM2 --> DES PCI overlaps prior BMS. Also noted CTO of SVG-Diag; b) CRESCENDO ANGINA: 05/2013 - prox limb SVG-OM1-OM2 DES PCI & PTCA of 95% ISR in distal Limb overlapping DES-BMS; Myoview 08/25/13 - small non-T-mural Inf scar, No Ischemia   S/P CABG x 5 1998   LIMA-LAD, SeqSVG-OM1-OM2; SVG-rPDA, SVG-D1    Past Surgical History:  Procedure Laterality Date   CATARACT EXTRACTION Bilateral    Dr. Dolores Lory   CATARACT EXTRACTION W/ INTRAOCULAR LENS  IMPLANT, BILATERAL Bilateral    CORONARY ANGIOPLASTY WITH STENT PLACEMENT  2003   DR LITTLE: BMS 3.0 mm x 8 mm stent to Seq limb anastomosis of SeqSVG-OM1-OM2 into OM2; & cutting balloon SVG-Diag   CORONARY ANGIOPLASTY WITH STENT PLACEMENT  06/28/2013   Procedure: CORONARY ANGIOPLASTY WITH STENT PLACEMENT ;  Surgeon: Troy Sine, MD;  Location: [facility] CATH LAB;  Service: Cardiovascular: PCI to SVG-CxOM1-OM2 99% prox lesion (Resolute DES 3.0 x 26 - 3.4 mm) & PTCA of  95% ISR in sequential limb (3.0 mm Angiosculpt)    CORONARY ARTERY BYPASS GRAFT  1998   SVG-diagonal, DVG-OM, DVG-PDA, LIMA-LAD   CORONARY STENT INTERVENTION  01/13/2012     Procedure: CORONARY STENT INTERVENTION ;  Surgeon: Lorretta Harp, MD;  Location: [facility] CATH LAB;  Service: Cardiovascular: PCI Seq limb of Seq SVG-OM1-OM2 90% -> 0% - Xience Xpedition DES 2.75 MM x15 MM (3 MM) overlaps prox edge of BMS into OM2   ESOPHAGOGASTRODUODENOSCOPY N/A 06/29/2013   Procedure: ESOPHAGOGASTRODUODENOSCOPY (EGD);  Surgeon: Beryle Beams, MD;  Location: [facility] ENDOSCOPY;  Service: Endoscopy;  Laterality: N/A;   EYE SURGERY Bilateral    Cat Sx - Dr. Dolores Lory   LEFT HEART CATH AND CORS/GRAFTS ANGIOGRAPHY  2003   ABN CARDIOLITE:  100% SVG-PDA, patent LIMA; 80% anastomotic lesion SVG-Diag; 99% anastomotic lesion of Seq limb SVG-OM1-OM2; Native LM OK - 50% & 80% prox LAD, 80% D1; Native OM1 & OM2 TO; Native RCA CTO (R_R collaterals). => PCI of SVG-OM2 &  PTCA of SVG-Diag   LEFT HEART CATHETERIZATION WITH CORONARY/GRAFT ANGIOGRAM N/A 01/13/2012   Procedure: LEFT HEART CATHETERIZATION WITH Beatrix Fetters;  Surgeon: Lorretta Harp, MD;  Location: [facility] CATH LAB;  Service: Cardiovascular: SVG-D1 CTO (new), knosn SVG-RCA CTO.  Native mRCA CTO (R-R collaterals); 50% distal LM, 70-80% prox LAD and 90% mid.  LIMA-LAD patent w/  70% post anastomosis. OM1 &OM2 CTO. Seq limb of SeqSVG-OM1-OM2 90% @ OM1 to BMS into OM2.   LEFT HEART CATHETERIZATION WITH CORONARY/GRAFT ANGIOGRAM N/A 06/28/2013   Procedure: LEFT HEART CATHETERIZATION WITH Beatrix Fetters;  Surgeon: Troy Sine, MD;  Location: [facility] CATH LAB;  Service: Cardiovascular: Cres Angina: a) 60% dLM; 70% Ostial LAD; 90% Cx - no OMs seen; 100%  RCA;; SVG-D1 & SVG--dRCA occluded; Patent LIMA-LAD with dLAD ~70-80%;; SeqSVG-OM1-OM2: 95% proximal limb, 50% mid & Diffuse ISR in overlapping DES/BMS in Seq Limb OM-OM2   NM MYOVIEW LTD  07/2013   Test looked good!! Evidence of possible old MI / scar, but nothing to suggest ischemia. Not Gated b/c ectopy .   NM MYOVIEW LTD  06/2017   Intermediate risk because of reduced EF but stable.  Stable fixed inferior defect consistent with either diaphragmatic attenuation or inferior infarct.  Septal hypokinesis/dyskinesis due to LBBB and prior CABG.  EF is 46%.  Stable compared to 2015 following PCI   TONSILLECTOMY     FAMILY HISTORY Family History  Problem Relation Age of Onset   Cancer Mother    Heart attack Father    SOCIAL HISTORY Social History   Tobacco Use   Smoking status: Former    Packs/day: 2.00    Years: 30.00    Pack years: 60.00    Types: Cigarettes    Quit date: 03/02/1990    Years since quitting: 30.9   Smokeless tobacco: Never  Substance Use Topics   Alcohol use: Yes    Comment: 06/28/2013 "aien't drank in years; used to drink a little beer"   Drug use: No       OPHTHALMIC EXAM:  Base Eye Exam     Visual Acuity (Snellen -  Linear)       Right Left   Dist [place] CF at 3' 20/60 -2   Dist ph [place] NI 20/40 -1         Tonometry (Tonopen, 7:54 AM)       Right Left   Pressure 23 12         Pupils       Dark Light Shape React APD   Right 3 2 Round Brisk None   Left 3 2 Round Brisk None         Visual Fields (Counting fingers)       Left Right    Full Full         Extraocular Movement       Right Left    Full, Ortho Full, Ortho         Neuro/Psych     Oriented x3: Yes   Mood/Affect: Normal         Dilation     Both eyes: 1.0% Mydriacyl, 2.5% Phenylephrine @ 7:55 AM           Slit Lamp and Fundus Exam     Slit Lamp Exam       Right Left   Lids/Lashes Dermatochalasis - upper lid Dermatochalasis - upper lid   Conjunctiva/Sclera White and quiet White and quiet   Cornea 1+ Punctate epithelial erosions, Arcus, 1+endopigment 2-3+ Punctate epithelial erosions, Arcus, 1+endopigment, irregular epi, decreased TBUT   Anterior Chamber deep, 0.5+pigment, mild pigmented vitreous prolapse temporal pupil Deep and quiet   Iris Round and dilated Round and dilated   Lens 3 piece PC IOL displaced inferiorly -- stable 3 piece PC IOL in good position   Anterior Vitreous Vitreous syneresis, Posterior vitreous detachment, vitreous condensations Vitreous syneresis, Posterior vitreous detachment         Fundus Exam       Right Left   Disc mild Pallor, Sharp rim, PPA/PPP mild Pallor, Sharp rim, +PPA   C/D Ratio 0.3 0.3   Macula Flat, Blunted foveal reflex, Drusen, RPE mottling, clumping and atrophy -- prominent central pigment clumping Flat, Blunted  foveal reflex, Drusen, trace cystic changes; RPE mottling, clumping and atrophy, +GA surrounding fovea and extending to disc, +central pigment clumping, no heme   Vessels Mild Vascular attenuation, mild tortuousity Mild Vascular attenuation   Periphery Attached, reticular degeneration, No heme Attached, reticular degeneration, flat, round pigmented  nevus at 1200 (~1.5DD)            IMAGING AND PROCEDURES  Imaging and Procedures for _0 @  OCT, Retina - OU - Both Eyes       Right Eye Quality was good. Central Foveal Thickness: 219. Progression has been stable. Findings include abnormal foveal contour, outer retinal atrophy, subretinal hyper-reflective material, pigment epithelial detachment, retinal drusen , no SRF, no IRF (Persistent ORA / SRHM).   Left Eye Quality was good. Central Foveal Thickness: 237. Progression has been stable. Findings include normal foveal contour, no SRF, outer retinal atrophy, retinal drusen , pigment epithelial detachment, subretinal hyper-reflective material, no IRF (Trace cystic changes temporal mac, persistent diffuse ORA, partial PVD).   Notes *Images captured and stored on drive  Diagnosis / Impression:  OD: Non-exu ARMD with persistent ORA / SRHM OS: exudative ARMD w/ Trace cystic changes temporal mac, persistent diffuse ORA, partial PVD  Clinical management:  See below  Abbreviations: NFP - Normal foveal profile. CME - cystoid macular edema. PED - pigment epithelial detachment. IRF - intraretinal fluid. SRF - subretinal fluid. EZ - ellipsoid zone. ERM - epiretinal membrane. ORA - outer retinal atrophy. ORT - outer retinal tubulation. SRHM - subretinal hyper-reflective material      Intravitreal Injection, Pharmacologic Agent - OS - Left Eye       Time Out 01/30/2021. 8:26 AM. Confirmed correct patient, procedure, site, and patient consented.   Anesthesia Topical anesthesia was used. Anesthetic medications included Lidocaine 2%, Proparacaine 0.5%.   Procedure Preparation included 5% betadine to ocular surface, eyelid speculum. A supplied needle was used.   Injection: 1.25 mg Bevacizumab 1.51m/0.05ml   Route: Intravitreal, Site: Left Eye   NDC:: 84132-440-10 Lot: [id number] , Expiration date: 03/11/2021, Waste: 0 mL   Post-op Post injection exam found visual acuity of at  least counting fingers. The patient tolerated the procedure well. There were no complications. The patient received written and verbal post procedure care education. Post injection medications were not given.             ASSESSMENT/PLAN:    ICD-10-CM   1. Exudative age-related macular degeneration of left eye with active choroidal neovascularization (HCC)  H35.3221 Intravitreal Injection, Pharmacologic Agent - OS - Left Eye    Bevacizumab (AVASTIN) SOLN 1.25 mg    2. Retinal edema  H35.81 OCT, Retina - OU - Both Eyes    3. 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 Essential hypertension  I10     5. Hypertensive retinopathy of both eyes  H35.033     6. Pseudophakia of both eyes  Z96.1     7. Dislocation of intraocular lens, sequela  T85.22XS     8. 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 Exudative age related macular degeneration OS  - OS with interval conversion to exu ARMD noted on 04.26.21  - s/p IVA OS #1 (04.26.21), #2 (05.26.21), #3 (06.24.21), #4 (08.05.21), #5 (09.16.21), #6 (10.28.21), #7 (12.16.21), #8 (2.10.22), #9 (04.14.22), #10 (06.23.22), #11 (09.08.22)  - BCVA 20/40 -- (  Mrx) stable  - OCT shows trace cystic changes temporal mac, persistent diffuse ORA at 12 wks  - recommend IVA OS #12 today, 12.01.22, maintenance w/ return in 12 wks  - pt wishes to proceed with IVA  - RBA of procedure discussed, questions answered  - informed consent obtained and signed  - see procedure note   - Avastin informed consent form signed and scanned on 04.26.2021  - f/u 12 wks, DFE, OCT, possible injection  3. Nonexudative ARMD OD  - OD advanced stage with foveal involvement -- BCVA CF 3' from geographic atrophy Age related macular degeneration, non-exudative, both eyes  - The incidence, anatomy, and pathology of dry AMD, risk of progression, and  the AREDS and AREDS 2 study including smoking risks discussed with patient.  - Recommend amsler grid monitoring   - f/u in 12 weeks  4,5. Hypertensive retinopathy OU  - discussed importance of tight BP control  - monitor   6-8. Pseudophakia OU w/ dislocated IOL and vitreous prolapse OD  - s/p CE/IOL OU (Dr. Dolores Lory)  - OS: 3-piece IOL in good position   - OD: 3-piece IOL dislocated inferiorly with mild vitreous prolapse temporal pupil -- IOL relatively stable  - OD with vision limited by advanced ARMD -- no intervention recommended at this time  - recommend monitoring for now -- may need PPV / IOL exchange if IOL dislocates completely   9. Choroidal nevus OS  - flat, pigmented round lesion at 12 oclock periphery  - 1.5DD in size; no SRF or orange pigment  - monitor    Ophthalmic Meds Ordered this visit:  Meds ordered this encounter  Medications   Bevacizumab (AVASTIN) SOLN 1.25 mg     Return in about 12 weeks (around 04/24/2021) for f/u exu ARMD OS, DFE, OCT.  There are no Patient Instructions on file for this visit.   This document serves as a record of services personally performed by Gardiner Sleeper, MD, PhD. It was created on their behalf by San Jetty. Owens Shark, OA an ophthalmic technician. The creation of this record is the provider's dictation and/or activities during the visit.    Electronically signed by: San Jetty. Owens Shark, [place] 11.28.2022 9:38 AM  Gardiner Sleeper, M.D., Ph.D. Diseases & Surgery of the Retina and Vitreous [facility]  I have reviewed the above documentation for accuracy and completeness, and I agree with the above. Gardiner Sleeper, M.D., Ph.D. 01/30/21 9:38 AM   Abbreviations: M myopia (nearsighted); A astigmatism; H hyperopia (farsighted); P presbyopia; Mrx spectacle prescription;  CTL contact lenses; OD right eye; OS left eye; OU both eyes  XT exotropia; ET esotropia; PEK punctate epithelial keratitis; PEE punctate epithelial erosions;  DES dry eye syndrome; MGD meibomian gland dysfunction; ATs artificial tears; PFAT's preservative free artificial tears; [place] nuclear sclerotic cataract; PSC posterior subcapsular cataract; ERM epi-retinal membrane; PVD posterior vitreous detachment; RD retinal detachment; DM diabetes mellitus; DR diabetic retinopathy; NPDR non-proliferative diabetic retinopathy; PDR proliferative diabetic retinopathy; CSME clinically significant macular edema; DME diabetic macular edema; dbh dot blot hemorrhages; CWS cotton wool spot; POAG primary open angle glaucoma; C/D cup-to-disc ratio; HVF humphrey visual field; GVF goldmann visual field; OCT optical coherence tomography; IOP intraocular pressure; BRVO Branch retinal vein occlusion; CRVO central retinal vein occlusion; CRAO central retinal artery occlusion; BRAO branch retinal artery occlusion; RT retinal tear; SB scleral buckle; PPV pars plana vitrectomy; VH Vitreous hemorrhage; PRP panretinal laser photocoagulation; IVK intravitreal kenalog; VMT vitreomacular traction; MH  Macular hole;  NVD neovascularization of the disc; NVE neovascularization elsewhere; AREDS age related eye disease study; ARMD age related macular degeneration; POAG primary open angle glaucoma; EBMD epithelial/anterior basement membrane dystrophy; ACIOL anterior chamber intraocular lens; IOL intraocular lens; PCIOL posterior chamber intraocular lens; Phaco/IOL phacoemulsification with intraocular lens placement; [place] photorefractive keratectomy; LASIK laser assisted in situ keratomileusis; HTN hypertension; DM diabetes mellitus; COPD chronic obstructive pulmonary disease

## 2021-01-30 ENCOUNTER — Ambulatory Visit (INDEPENDENT_AMBULATORY_CARE_PROVIDER_SITE_OTHER): Payer: Medicare Other | Admitting: Ophthalmology

## 2021-01-30 ENCOUNTER — Encounter (INDEPENDENT_AMBULATORY_CARE_PROVIDER_SITE_OTHER): Payer: Medicare Other | Admitting: Ophthalmology

## 2021-01-30 ENCOUNTER — Other Ambulatory Visit: Payer: Self-pay

## 2021-01-30 ENCOUNTER — Encounter (INDEPENDENT_AMBULATORY_CARE_PROVIDER_SITE_OTHER): Payer: Self-pay | Admitting: Ophthalmology

## 2021-01-30 DIAGNOSIS — T8522XS Displacement of intraocular lens, sequela: Secondary | ICD-10-CM

## 2021-01-30 DIAGNOSIS — H35033 Hypertensive retinopathy, bilateral: Secondary | ICD-10-CM

## 2021-01-30 DIAGNOSIS — H353221 Exudative age-related macular degeneration, left eye, with active choroidal neovascularization: Secondary | ICD-10-CM

## 2021-01-30 DIAGNOSIS — I10 Essential (primary) hypertension: Secondary | ICD-10-CM | POA: Diagnosis not present

## 2021-01-30 DIAGNOSIS — Z961 Presence of intraocular lens: Secondary | ICD-10-CM

## 2021-01-30 DIAGNOSIS — H4301 Vitreous prolapse, right eye: Secondary | ICD-10-CM

## 2021-01-30 DIAGNOSIS — H353114 Nonexudative age-related macular degeneration, right eye, advanced atrophic with subfoveal involvement: Secondary | ICD-10-CM | POA: Diagnosis not present

## 2021-01-30 DIAGNOSIS — H3581 Retinal edema: Secondary | ICD-10-CM

## 2021-01-30 DIAGNOSIS — D3132 Benign neoplasm of left choroid: Secondary | ICD-10-CM

## 2021-01-30 MED ORDER — BEVACIZUMAB CHEMO INJECTION 1.25MG/0.05ML SYRINGE FOR KALEIDOSCOPE
1.2500 mg | INTRAVITREAL | Status: AC | PRN
  Administered 2021-01-30: 1.25 mg via INTRAVITREAL

## 2021-02-04 ENCOUNTER — Telehealth: Payer: Self-pay | Admitting: Pharmacy Technician

## 2021-02-04 DIAGNOSIS — Z596 Low income: Secondary | ICD-10-CM

## 2021-02-04 NOTE — Progress Notes (Signed)
[facility])                                            [place] Team    02/04/2021  Xavier Giles June 04, 1940 [id number]  Received both patient  portion(s) of patient assistance application(s) for Brilinta. Faxed completed application and required documents into [employer].   [name] P. Cathyann Kilfoyle, [name]  [phone]

## 2021-03-07 ENCOUNTER — Telehealth: Payer: Self-pay | Admitting: Pharmacy Technician

## 2021-03-07 DIAGNOSIS — Z596 Low income: Secondary | ICD-10-CM

## 2021-03-07 NOTE — Progress Notes (Signed)
[facility])                                            [place] Team    03/07/2021  Xavier Giles 1940/07/31 [id number]  Care coordination call placed to [employer] in regard to [name] application.  Spoke to [place] who informs his current enrollment does not end until 03/29/21 and his last shipment was in September. She informs she will process a shipment for him today with an expected delivery in 10-15 business days barring any shipping delays due to weather.   She also informs he has been APPROVED for 2023 beginning 03/30/2021 until 03/30/2022. An updated rx is on file for processing in 2023.  Successful outreach to patient's daughter Xavier Giles, [place] verified. Xavier Giles was made aware of the above information and verbalized understanding.

## 2021-04-02 ENCOUNTER — Other Ambulatory Visit: Payer: Self-pay | Admitting: Cardiology

## 2021-04-21 NOTE — Progress Notes (Signed)
[facility] Note  04/24/2021     CHIEF COMPLAINT Patient presents for Retina Follow Up    HISTORY OF PRESENT ILLNESS: Xavier Giles is a 81 y.o. 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  Duration of 12 weeks.  Since onset it is stable.  I, the attending physician,  performed the HPI with the patient and updated documentation appropriately.        Comments   Pt states vision is about the same      Last edited by Bernarda Caffey, MD on 04/27/2021  1:25 AM.    pt reports no change in vision and no new health concerns   Referring physician: No referring provider defined for this encounter.  HISTORICAL INFORMATION:   Selected notes from the MEDICAL RECORD NUMBER Referred by Dr. Parke Simmers for concern of ARMD OU   CURRENT MEDICATIONS: No current outpatient medications on file. (Ophthalmic Drugs)   No current facility-administered medications for this visit. (Ophthalmic Drugs)   Current Outpatient Medications (Other)  Medication Sig   amLODipine-benazepril (LOTREL) 10-20 MG capsule TAKE 1 CAPSULE BY MOUTH DAILY   atenolol (TENORMIN) 50 MG tablet Take 1 tablet (50 mg total) by mouth daily.   isosorbide mononitrate (IMDUR) 60 MG 24 hr tablet Take 1 tablet by mouth once daily   nitroGLYCERIN (NITROSTAT) 0.4 MG SL tablet Place 1 tablet (0.4 mg total) under the tongue every 5 (five) minutes as needed (up to 3 doses).   pravastatin (PRAVACHOL) 40 MG tablet Take 1 tablet (40 mg total) by mouth daily.   rosuvastatin (CRESTOR) 20 MG tablet Take 1 tablet by mouth once daily   ticagrelor (BRILINTA) 60 MG TABS tablet Take 1 tablet (60 mg total) by mouth 2 (two) times daily.   No current facility-administered medications for this visit. (Other)   REVIEW OF SYSTEMS: ROS   Positive for: Cardiovascular, Eyes Negative for: Constitutional, Gastrointestinal, Neurological, Skin,  Genitourinary, Musculoskeletal, HENT, Endocrine, Respiratory, Psychiatric, Allergic/Imm, Heme/Lymph Last edited by Debbrah Alar, COT on 04/24/2021  7:53 AM.     ALLERGIES Allergies  Allergen Reactions   Keflex [Cephalexin] Other (See Comments)    Unknown    Zocor [Simvastatin] Other (See Comments)    Unknown    Lipitor [Atorvastatin] Other (See Comments)    Elevated blood sugar   Sulfa Antibiotics Nausea Only   PAST MEDICAL HISTORY Past Medical History:  Diagnosis Date   Asthma    CAD (coronary artery disease) of bypass graft 2003   (now known CTO of SVG-rPDA & SVG-D1 & 3 stents in SeqSVg-Om1-OM2 (1 in prox leg, overlapping DES-BMS from OM1-OM2 into OM2 - s/p PTCA for ISR).; LIMA-LAD patent    CAD S/P PTCA-PCI of SeqSVG-OM1-OM2 2003; 01/2012; 05/2013   a) '03 SeqSVG-OM1-OM2,  Zeta BMS 3.0x8 Seq limb into OM2), & PTCA of SVg-D1 anastomosis; b) 2013: Distal Limb of SeqSVG-OM1-OM2 from OM1-2 overlap BMS ->Xience DES 2.75 x 15 - 70mm;; c) 05/2013 Proxlimb SVG--OM1-OM2 -> Resolute DES 3 x 26 - 3.69mm; Angiosculpt PTCA of ISR in Seq Limb overlaping stents (3.25 mm)   CAD, multiple vessel - Native CAD. 1998; 2015   a) Referred for CABGx5 ; by Cath 05/2013: dLM 60%, Ost LAD 70&. Prox -mid LAD extensive 70%( to-fro flow); D1 80%; OM1 & OM2 (CTO), follow-on LCx beyond AVG 90+% with small OM3/OM4;  RCA TO (R-R collaterals);   Carotid bruit present 01/2012   a. 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 Also noted CTO of SVG-Diag; b) CRESCENDO  ANGINA: 05/2013 - prox limb SVG-OM1-OM2 DES PCI & PTCA of 95% ISR in distal Limb overlapping DES-BMS; Myoview 08/25/13 - small non-T-mural Inf scar, No Ischemia   S/P CABG x 5 1998   LIMA-LAD, SeqSVG-OM1-OM2; SVG-rPDA, SVG-D1    Past Surgical History:  Procedure Laterality Date   CATARACT EXTRACTION Bilateral    Dr. Dolores Lory   CATARACT EXTRACTION W/ INTRAOCULAR LENS  IMPLANT, BILATERAL Bilateral    CORONARY ANGIOPLASTY WITH STENT PLACEMENT  2003   DR LITTLE: BMS 3.0 mm x 8 mm stent to Seq limb anastomosis of SeqSVG-OM1-OM2 into OM2; & cutting balloon SVG-Diag   CORONARY ANGIOPLASTY WITH STENT PLACEMENT  06/28/2013   Procedure: CORONARY ANGIOPLASTY WITH STENT PLACEMENT ;  Surgeon: Troy Sine, MD;  Location: [facility] CATH LAB;  Service: Cardiovascular: PCI to SVG-CxOM1-OM2 99% prox lesion (Resolute DES 3.0 x 26 - 3.4 mm) & PTCA of  95% ISR in sequential limb (3.0 mm Angiosculpt)    CORONARY ARTERY BYPASS GRAFT  1998   SVG-diagonal, DVG-OM, DVG-PDA, LIMA-LAD   CORONARY STENT INTERVENTION  01/13/2012     Procedure: CORONARY STENT INTERVENTION ;  Surgeon: Lorretta Harp, MD;  Location: [facility] CATH LAB;  Service: Cardiovascular: PCI Seq limb of Seq SVG-OM1-OM2 90% -> 0% - Xience Xpedition DES 2.75 MM x15 MM (3 MM) overlaps prox edge of BMS into OM2   ESOPHAGOGASTRODUODENOSCOPY N/A 06/29/2013   Procedure: ESOPHAGOGASTRODUODENOSCOPY (EGD);  Surgeon: Beryle Beams, MD;  Location: [facility] ENDOSCOPY;  Service: Endoscopy;  Laterality: N/A;   EYE SURGERY Bilateral    Cat Sx - Dr. Dolores Lory   LEFT HEART CATH AND CORS/GRAFTS ANGIOGRAPHY  2003   ABN CARDIOLITE:  100% SVG-PDA, patent LIMA; 80% anastomotic lesion SVG-Diag; 99% anastomotic lesion of Seq limb SVG-OM1-OM2; Native LM OK - 50% & 80% prox LAD, 80% D1; Native OM1 & OM2 TO; Native RCA CTO (R_R collaterals). => PCI of SVG-OM2 & PTCA of SVG-Diag   LEFT HEART CATHETERIZATION WITH CORONARY/GRAFT ANGIOGRAM N/A 01/13/2012   Procedure: LEFT HEART CATHETERIZATION WITH  Beatrix Fetters;  Surgeon: Lorretta Harp, MD;  Location: [facility] CATH LAB;  Service: Cardiovascular: SVG-D1 CTO (new), knosn SVG-RCA CTO.  Native mRCA CTO (R-R collaterals); 50% distal LM, 70-80% prox LAD and 90% mid.  LIMA-LAD patent w/  70% post anastomosis. OM1 &OM2 CTO. Seq limb of SeqSVG-OM1-OM2 90% @ OM1 to BMS into OM2.   LEFT HEART CATHETERIZATION WITH CORONARY/GRAFT ANGIOGRAM N/A 06/28/2013   Procedure: LEFT HEART CATHETERIZATION WITH Beatrix Fetters;  Surgeon: Troy Sine, MD;  Location: [facility] CATH LAB;  Service: Cardiovascular: Cres Angina: a) 60% dLM; 70% Ostial LAD; 90% Cx - no OMs seen; 100% RCA;; SVG-D1 & SVG--dRCA occluded; Patent LIMA-LAD with dLAD ~70-80%;; SeqSVG-OM1-OM2: 95% proximal limb, 50% mid & Diffuse ISR in overlapping DES/BMS in Seq Limb OM-OM2   NM  MYOVIEW LTD  07/2013   Test looked good!! Evidence of possible old MI / scar, but nothing to suggest ischemia. Not Gated b/c ectopy .   NM MYOVIEW LTD  06/2017   Intermediate risk because of reduced EF but stable.  Stable fixed inferior defect consistent with either diaphragmatic attenuation or inferior infarct.  Septal hypokinesis/dyskinesis due to LBBB and prior CABG.  EF is 46%.  Stable compared to 2015 following PCI   TONSILLECTOMY     FAMILY HISTORY Family History  Problem Relation Age of Onset   Cancer Mother    Heart attack Father    SOCIAL HISTORY Social History   Tobacco Use   Smoking status: Former    Packs/day: 2.00    Years: 30.00    Pack years: 60.00    Types: Cigarettes    Quit date: 03/02/1990    Years since quitting: 31.1   Smokeless tobacco: Never  Substance Use Topics   Alcohol use: Yes    Comment: 06/28/2013 "aien't drank in years; used to drink a little beer"   Drug use: No       OPHTHALMIC EXAM:  Base Eye Exam     Visual Acuity (Snellen - Linear)       Right Left   Dist Tangent CF at 2' 20/70 -2   Dist ph [place]  20/40 -1         Tonometry (Tonopen, 8:18 AM)       Right  Left   Pressure 13 15         Pupils       Dark Light Shape React APD   Right 3 2 Round Brisk None   Left 3 2 Round Brisk None         Visual Fields (Counting fingers)       Left Right    Full Full         Extraocular Movement       Right Left    Full, Ortho Full, Ortho         Neuro/Psych     Oriented x3: Yes   Mood/Affect: Normal           Slit Lamp and Fundus Exam     Slit Lamp Exam       Right Left   Lids/Lashes Dermatochalasis - upper lid Dermatochalasis - upper lid   Conjunctiva/Sclera White and quiet White and quiet   Cornea 1+ Punctate epithelial erosions, Arcus, 1+endopigment 1-2+ Punctate epithelial erosions, Arcus, trace endo pigment, irregular epi, decreased TBUT   Anterior Chamber deep, 0.5+pigment, mild pigmented vitreous prolapse temporal pupil Deep and quiet   Iris Round and dilated Round and dilated   Lens 3 piece PC IOL displaced inferiorly -- stable 3 piece PC IOL in good position   Anterior Vitreous Vitreous syneresis, Posterior vitreous detachment, vitreous condensations Vitreous syneresis, Posterior vitreous detachment         Fundus Exam       Right Left   Disc mild Pallor, Sharp rim, PPA/PPP mild Pallor, Sharp rim, +PPA   C/D Ratio 0.3 0.3   Macula Flat, Blunted foveal reflex, Drusen, RPE mottling, clumping and atrophy -- prominent central pigment clumping Flat, Blunted foveal reflex, Drusen, trace cystic changes - improved; RPE mottling, clumping and atrophy, +GA surrounding fovea and extending to disc, +central pigment clumping, no heme   Vessels Mild Vascular attenuation, mild tortuousity Mild Vascular attenuation   Periphery Attached, reticular degeneration, No heme Attached, reticular degeneration, flat, round  pigmented nevus at 1200 (~1.5DD)            IMAGING AND PROCEDURES  Imaging and Procedures for @TODAY @  OCT, Retina - OU - Both Eyes       Right Eye Quality was borderline. Central Foveal Thickness: 242.  Progression has been stable. Findings include abnormal foveal contour, outer retinal atrophy, subretinal hyper-reflective material, pigment epithelial detachment, retinal drusen , no SRF, no IRF (Persistent ORA / SRHM).   Left Eye Quality was borderline. Central Foveal Thickness: 346. Progression has been stable. Findings include no SRF, outer retinal atrophy, retinal drusen , pigment epithelial detachment, subretinal hyper-reflective material, no IRF, abnormal foveal contour (Stable improvement in IRF/cystic changes, persistent diffuse ORA, partial PVD).   Notes *Images captured and stored on drive  Diagnosis / Impression:  OD: Non-exu ARMD with persistent ORA / SRHM OS: exudative ARMD w/ Stable improvement in IRF/cystic changes, persistent diffuse ORA, partial PVD  Clinical management:  See below  Abbreviations: NFP - Normal foveal profile. CME - cystoid macular edema. PED - pigment epithelial detachment. IRF - intraretinal fluid. SRF - subretinal fluid. EZ - ellipsoid zone. ERM - epiretinal membrane. ORA - outer retinal atrophy. ORT - outer retinal tubulation. SRHM - subretinal hyper-reflective material            ASSESSMENT/PLAN:    ICD-10-CM   1. Exudative age-related macular degeneration of left eye with active choroidal neovascularization (HCC)  H35.3221 OCT, Retina - OU - Both Eyes    2. 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 Essential hypertension  I10     4. Hypertensive retinopathy of both eyes  H35.033     5. Pseudophakia of both eyes  Z96.1     6. Dislocation of intraocular lens, sequela  T85.22XS     7. Vitreous prolapse of right eye  H43.01     8. 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 Exudative age related macular degeneration OS  - OS with interval conversion to exu ARMD noted on 04.26.21  - s/p IVA OS #1 (04.26.21), #2 (05.26.21), #3 (06.24.21), #4 (08.05.21), #5  (09.16.21), #6 (10.28.21), #7 (12.16.21), #8 (2.10.22), #9 (04.14.22), #10 (06.23.22), #11 (09.08.22), #12 (12.01.22)  - BCVA 20/40 -- (Mrx) stable  - OCT shows Stable improvement in IRF/cystic changes, persistent diffuse ORA at 12 weeks  - recommend holding off on injection today - pt in agreement  - Avastin informed consent form signed and scanned on 04.26.2021  - f/u 2-3 months, DFE, OCT, possible injection  2. Nonexudative ARMD OD  - OD advanced stage with foveal involvement -- BCVA CF 3' from geographic atrophy Age related macular degeneration, non-exudative, both eyes  - The incidence, anatomy, and pathology of dry AMD, risk of progression, and the AREDS and AREDS 2 study including smoking risks discussed with patient.  - Recommend amsler grid monitoring   - f/u in 3 months  3,4. Hypertensive retinopathy OU  - discussed importance of tight BP control  - monitor  5-7. Pseudophakia OU w/ dislocated IOL and vitreous prolapse OD  - s/p CE/IOL OU (Dr. Dolores Lory)  - OS: 3-piece IOL in good position   - OD: 3-piece IOL dislocated inferiorly with mild vitreous prolapse temporal pupil -- IOL relatively stable  - OD with vision limited by advanced ARMD -- no intervention recommended at this time  - recommend monitoring for now--may need PPV/IOL exchange  in the future if IOL dislocates completely  8. Choroidal nevus OS  - flat, pigmented round lesion at 12 oclock periphery  - 1.5DD in size; no SRF or orange pigment  - monitor   Ophthalmic Meds Ordered this visit:  No orders of the defined types were placed in this encounter.    Return for f/u 2-3 months, exu ARMD OS, DFE, OCT.  There are no Patient Instructions on file for this visit.   This document serves as a record of services personally performed by Gardiner Sleeper, MD, PhD. It was created on their behalf by Roselee Nova, COMT. The creation of this record is the provider's dictation and/or activities during the  visit.  Electronically signed by: Roselee Nova, COMT 04/27/21 1:26 AM   This document serves as a record of services personally performed by Gardiner Sleeper, MD, PhD. It was created on their behalf by San Jetty. Owens Shark, OA an ophthalmic technician. The creation of this record is the provider's dictation and/or activities during the visit.    Electronically signed by: San Jetty. Owens Shark, [place] 02.23.2023 1:26 AM  Gardiner Sleeper, M.D., Ph.D. Diseases & Surgery of the Retina and Vitreous [facility]  I have reviewed the above documentation for accuracy and completeness, and I agree with the above. Gardiner Sleeper, M.D., Ph.D. 04/27/21 1:28 AM   Abbreviations: M myopia (nearsighted); A astigmatism; H hyperopia (farsighted); P presbyopia; Mrx spectacle prescription;  CTL contact lenses; OD right eye; OS left eye; OU both eyes  XT exotropia; ET esotropia; PEK punctate epithelial keratitis; PEE punctate epithelial erosions; DES dry eye syndrome; MGD meibomian gland dysfunction; ATs artificial tears; PFAT's preservative free artificial tears; [place] nuclear sclerotic cataract; PSC posterior subcapsular cataract; ERM epi-retinal membrane; PVD posterior vitreous detachment; RD retinal detachment; DM diabetes mellitus; DR diabetic retinopathy; NPDR non-proliferative diabetic retinopathy; PDR proliferative diabetic retinopathy; CSME clinically significant macular edema; DME diabetic macular edema; dbh dot blot hemorrhages; CWS cotton wool spot; POAG primary open angle glaucoma; C/D cup-to-disc ratio; HVF humphrey visual field; GVF goldmann visual field; OCT optical coherence tomography; IOP intraocular pressure; BRVO Branch retinal vein occlusion; CRVO central retinal vein occlusion; CRAO central retinal artery occlusion; BRAO branch retinal artery occlusion; RT retinal tear; SB scleral buckle; PPV pars plana vitrectomy; VH Vitreous hemorrhage; PRP panretinal laser photocoagulation; IVK  intravitreal kenalog; VMT vitreomacular traction; MH Macular hole;  NVD neovascularization of the disc; NVE neovascularization elsewhere; AREDS age related eye disease study; ARMD age related macular degeneration; POAG primary open angle glaucoma; EBMD epithelial/anterior basement membrane dystrophy; ACIOL anterior chamber intraocular lens; IOL intraocular lens; PCIOL posterior chamber intraocular lens; Phaco/IOL phacoemulsification with intraocular lens placement; [place] photorefractive keratectomy; LASIK laser assisted in situ keratomileusis; HTN hypertension; DM diabetes mellitus; COPD chronic obstructive pulmonary disease

## 2021-04-24 ENCOUNTER — Ambulatory Visit (INDEPENDENT_AMBULATORY_CARE_PROVIDER_SITE_OTHER): Payer: Medicare Other | Admitting: Ophthalmology

## 2021-04-24 ENCOUNTER — Other Ambulatory Visit: Payer: Self-pay

## 2021-04-24 ENCOUNTER — Encounter (INDEPENDENT_AMBULATORY_CARE_PROVIDER_SITE_OTHER): Payer: Self-pay | Admitting: Ophthalmology

## 2021-04-24 DIAGNOSIS — H35033 Hypertensive retinopathy, bilateral: Secondary | ICD-10-CM | POA: Diagnosis not present

## 2021-04-24 DIAGNOSIS — H353221 Exudative age-related macular degeneration, left eye, with active choroidal neovascularization: Secondary | ICD-10-CM

## 2021-04-24 DIAGNOSIS — T8522XS Displacement of intraocular lens, sequela: Secondary | ICD-10-CM

## 2021-04-24 DIAGNOSIS — H353114 Nonexudative age-related macular degeneration, right eye, advanced atrophic with subfoveal involvement: Secondary | ICD-10-CM

## 2021-04-24 DIAGNOSIS — H4301 Vitreous prolapse, right eye: Secondary | ICD-10-CM

## 2021-04-24 DIAGNOSIS — D3132 Benign neoplasm of left choroid: Secondary | ICD-10-CM

## 2021-04-24 DIAGNOSIS — I10 Essential (primary) hypertension: Secondary | ICD-10-CM | POA: Diagnosis not present

## 2021-04-24 DIAGNOSIS — Z961 Presence of intraocular lens: Secondary | ICD-10-CM

## 2021-04-27 ENCOUNTER — Encounter (INDEPENDENT_AMBULATORY_CARE_PROVIDER_SITE_OTHER): Payer: Self-pay | Admitting: Ophthalmology

## 2021-05-27 ENCOUNTER — Ambulatory Visit (INDEPENDENT_AMBULATORY_CARE_PROVIDER_SITE_OTHER): Payer: Medicare Other | Admitting: Cardiology

## 2021-05-27 ENCOUNTER — Encounter: Payer: Self-pay | Admitting: Cardiology

## 2021-05-27 ENCOUNTER — Other Ambulatory Visit: Payer: Self-pay

## 2021-05-27 ENCOUNTER — Telehealth: Payer: Self-pay | Admitting: *Deleted

## 2021-05-27 VITALS — BP 138/60 | HR 76 | Ht 71.0 in | Wt 170.4 lb

## 2021-05-27 DIAGNOSIS — I25709 Atherosclerosis of coronary artery bypass graft(s), unspecified, with unspecified angina pectoris: Secondary | ICD-10-CM | POA: Diagnosis not present

## 2021-05-27 DIAGNOSIS — Z951 Presence of aortocoronary bypass graft: Secondary | ICD-10-CM | POA: Diagnosis not present

## 2021-05-27 DIAGNOSIS — I10 Essential (primary) hypertension: Secondary | ICD-10-CM

## 2021-05-27 DIAGNOSIS — I2510 Atherosclerotic heart disease of native coronary artery without angina pectoris: Secondary | ICD-10-CM | POA: Diagnosis not present

## 2021-05-27 DIAGNOSIS — I214 Non-ST elevation (NSTEMI) myocardial infarction: Secondary | ICD-10-CM | POA: Diagnosis not present

## 2021-05-27 DIAGNOSIS — I2089 Other forms of angina pectoris: Secondary | ICD-10-CM

## 2021-05-27 DIAGNOSIS — I4510 Unspecified right bundle-branch block: Secondary | ICD-10-CM

## 2021-05-27 DIAGNOSIS — I208 Other forms of angina pectoris: Secondary | ICD-10-CM

## 2021-05-27 DIAGNOSIS — E785 Hyperlipidemia, unspecified: Secondary | ICD-10-CM

## 2021-05-27 DIAGNOSIS — I951 Orthostatic hypotension: Secondary | ICD-10-CM

## 2021-05-27 LAB — CBC
Hematocrit: 45.9 % (ref 37.5–51.0)
Hemoglobin: 15.2 g/dL (ref 13.0–17.7)
MCH: 28.3 pg (ref 26.6–33.0)
MCHC: 33.1 g/dL (ref 31.5–35.7)
MCV: 85 fL (ref 79–97)
Platelets: 197 10*3/uL (ref 150–450)
RBC: 5.38 x10E6/uL (ref 4.14–5.80)
RDW: 13.6 % (ref 11.6–15.4)
WBC: 7.9 10*3/uL (ref 3.4–10.8)

## 2021-05-27 LAB — COMPREHENSIVE METABOLIC PANEL
ALT: 14 IU/L (ref 0–44)
AST: 17 IU/L (ref 0–40)
Albumin/Globulin Ratio: 2.5 — ABNORMAL HIGH (ref 1.2–2.2)
Albumin: 4.9 g/dL — ABNORMAL HIGH (ref 3.6–4.6)
Alkaline Phosphatase: 96 IU/L (ref 44–121)
BUN/Creatinine Ratio: 12 (ref 10–24)
BUN: 12 mg/dL (ref 8–27)
Bilirubin Total: 0.5 mg/dL (ref 0.0–1.2)
CO2: 26 mmol/L (ref 20–29)
Calcium: 10.1 mg/dL (ref 8.6–10.2)
Chloride: 102 mmol/L (ref 96–106)
Creatinine, Ser: 1.02 mg/dL (ref 0.76–1.27)
Globulin, Total: 2 g/dL (ref 1.5–4.5)
Glucose: 155 mg/dL — ABNORMAL HIGH (ref 70–99)
Potassium: 5 mmol/L (ref 3.5–5.2)
Sodium: 141 mmol/L (ref 134–144)
Total Protein: 6.9 g/dL (ref 6.0–8.5)
eGFR: 73 mL/min/{1.73_m2} (ref >59)

## 2021-05-27 LAB — LIPID PANEL
Chol/HDL Ratio: 2.3 ratio (ref 0.0–5.0)
Cholesterol, Total: 111 mg/dL (ref 100–199)
HDL: 48 mg/dL (ref >39)
LDL Chol Calc (NIH): 45 mg/dL (ref 0–99)
Triglycerides: 93 mg/dL (ref 0–149)
VLDL Cholesterol Cal: 18 mg/dL (ref 5–40)

## 2021-05-27 NOTE — Progress Notes (Signed)
? ? ?Primary Care Provider: Pcp, No ?Cardiologist: Glenetta Hew, MD ?Electrophysiologist: None ? ?Clinic Note: ?Chief Complaint  ?Patient presents with  ? Follow-up  ?  Annual follow-up.  Doing well.  ? Shortness of Breath  ?  Daughter noted some exertional dyspnea  ? Coronary Artery Disease  ?  No angina  ? ?=================================== ? ?ASSESSMENT/PLAN  ? ?Problem List Items Addressed This Visit   ? ?  ? Cardiology Problems  ? CAD, multiple vessel;  CABG in 1998; CTO SVG-RCA & SVG-Diag, multiple PCI SVG-OM, patent LIMA-LAD with dLAD 80% (Chronic)  ?  Notable multivessel disease in both the native artery and grafts. ?Not really truly having significant anginal symptoms.  More so some exertional dyspnea and fatigue. ? ?Plan: ?On beta-blocker and calcium channel blocker along with Imdur for antianginal effect ?ACE inhibitor for afterload reduction and blood pressure ?Confirm that he is only on 1-2 statins.  Preferably rosuvastatin 20 mg. ?Refill PRN nitroglycerin. ?With exertional dyspnea, new RBBB, plan is to check 2D echo.  Based on results-plus or minus further ischemic evaluation. ?On maintenance Brilinta ?  ?  ? Relevant Orders  ? EKG 12-Lead (Completed)  ? CBC (Completed)  ? Comprehensive metabolic panel (Completed)  ? Lipid panel (Completed)  ? ECHOCARDIOGRAM COMPLETE  ? Coronary artery disease involving coronary bypass graft of native heart with angina pectoris (HCC) (Chronic)  ?  Not really complaining of symptoms that would suggest angina, just some exertional dyspnea but not really that much above baseline according to him.  He is on a pretty aggressive regimen with beta-blocker, calcium channel blocker and Imdur. ?Currently living is LIMA and vein grafts, 1 of which has 3 stents ? ?Plan: ?Not on aspirin.  He is on standing maintenance dose of Brilinta 60 mg twice daily ?Okay to hold Brilinta 5 to 7 days preop for surgeries or procedures. ?  ?  ? Relevant Orders  ? EKG 12-Lead (Completed)  ? CBC  (Completed)  ? Comprehensive metabolic panel (Completed)  ? Lipid panel (Completed)  ? ECHOCARDIOGRAM COMPLETE  ? Stable angina (HCC) (Chronic)  ?  He does not seem to be having his stable angina.  I suspect he may not be as active as he had been.  He is however noticing some dyspnea which his daughter pointed out. ? ?Continue to monitor -= low threshold to re-test Myoview ?For now - checking Echo for EF & valve Evaluation due to New RBBB. ?  ?  ? History of: NSTEMI (non-ST elevated myocardial infarction) (Chronic)  ?  1 documented true ACS/non-STEMI with the vessel being unstable angina and troponin elevation.  Clear evidence of inferior perfusion defect on Myoview stress test.  I do not think it was diaphragm attenuation. ? ?Not really having any adverse effects, but currently little bit more short of breath. ?  ?  ? Relevant Orders  ? EKG 12-Lead (Completed)  ? CBC (Completed)  ? Comprehensive metabolic panel (Completed)  ? Lipid panel (Completed)  ? ECHOCARDIOGRAM COMPLETE  ? Essential hypertension (Chronic)  ?  Given his issues with orthostatic hypotension, we are allowing for mild permissive hypertension.  Difficult subject when he is having some exertional dyspnea, we will continue to treat with stable doses atenolol, amlodipine/benazepril and Imdur. ?  ?  ? Relevant Orders  ? CBC (Completed)  ? Comprehensive metabolic panel (Completed)  ? Lipid panel (Completed)  ? Dyslipidemia, goal LDL below 70 (Chronic)  ?  I had switched him from pravastatin to rosuvastatin.  Both meds are listed.  I want him to continue taking rosuvastatin without pravastatin. ? ?We had him recheck labs after clinic: Thankfully, lipids look pretty stable.  I suspect he probably is only taking one of the treatment.  We will simply have him clarify that he is not taking pravastatin. ?Lab Results  05/27/2021  ?Component Value  ? CHOL 111  ? HDL 48  ? [place] 45  ? TRIG 93  ? CHOLHDL 2.3  ? ?  ?  ? Relevant Orders  ? CBC (Completed)  ?  Comprehensive metabolic panel (Completed)  ? Lipid panel (Completed)  ? Orthostatic hypotension  ?  Stressed importance of avoiding dehydration reticulocyte stopped.  He does have some edema which are preferred to treat with foot elevation and support stockings. ?  ?  ? 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  This is a new finding.  Probably benign, however need to ensure that there is no change in overall function.  With him having some dyspnea, I would like to reassess EF, wall motion etc. ? ?Plan: Check 2D echo ?  ?  ? Relevant Orders  ? EKG 12-Lead (Completed)  ? CBC (Completed)  ? Comprehensive metabolic panel (Completed)  ? Lipid panel (Completed)  ? ECHOCARDIOGRAM COMPLETE  ?  ? Other  ? S/P CABG x 3 (Chronic)  ?  Last Myoview was in May 2019.  Just about 4 years from last study.  Would not be unreasonable if his dyspnea progresses or if he has true angina to reassess with stress test. ?  ?  ? Relevant Orders  ? EKG 12-Lead (Completed)  ? CBC (Completed)  ? Comprehensive metabolic panel (Completed)  ? Lipid panel (Completed)  ? ECHOCARDIOGRAM COMPLETE  ? ? ?=================================== ? ?HPI:   ? ?Xavier Giles is a 81 y.o. male with a PMH below who presents today for annual follow-up ? ?Cardiovascular History ?1998: - Cath- MV CAD-> CABGx5 (LIMA-LAD, SVG-OM1-OM2; SVG-rPDA, SVG-D1) ?2003, Abn Cardiolite:-> PCI-SVG-OM2-OM3 (prox limb): 3.0 x 8 Zeta BMS- going into native OM2);; PTCA only native D1 through SVG; CTO SVG-RCA; patent LIMA ?01/2012 - NSTEMI: SVG-RCA & SVG-Diag CTO.  Severe Seq limb of SeqSVG-OM1->OM2  => DES PCI (Xience 2.75 x 15 - 74m -> stenosis just above prior bare-metal stent) ?05/2013- UNSTABLE ANGINA: Cardiac CATH: dLM 60%, ostial LAD 70% with flow to D1 and D2.  Extensive Dz after D2 with to-fro flow from LIMA.  Patent LIMA-LAD w/ progression of dLAD to ~80%.  PCI to SVG-CxOM1-OM2 99% prox lesion (Resolute Onyx DES 2 x 26- 3.3 mm) & PTCA of 95% ISR in sequential  limb..[name] ?08/24/13: Myoview Stress Test post PCI looked good!! Evidence of possible old MI / scar, but nothing to suggest ischemia. Not Gated b/c ectopy .  -- No real change in f/u Myoview 06/2017 ? ?CJayland Giles was last seen on May 23, 2020-doing fairly well.  Baseline exertional dyspnea with higher levels of physical activity.  Occasional pulling sensation in his chest but nothing significant.  2-3 times a night nocturia but no PND orthopnea.  Nighttime cramps.  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 ? ?Recent Hospitalizations: None ? ?Reviewed  CV studies:   ? ?The following studies were reviewed today: (if available, images/films reviewed: From Epic Chart or Care Everywhere) ?None: ? ?Interval History:  ? ?Xavier Giles returns today overall pretty stable.  He does not  really mention much of being short of breath.  (However his daughter apparently talked to Trixie Dredge, RN (my clinic nurse) and explained that he is short of breath with activity.  Davyon just simply says this is baseline exertional dyspnea with increased activity that he does, it gives out.  He is still quite active enjoys doing yard work, but just gives out easily.  No complaint of chest pain or pressure. ?No PND or orthopnea, but does have some mild end of day edema.  It usually goes down he puts his feet up. ? ?He also notes the leg cramping ? ?CV Review of Symptoms (Summary) ?Cardiovascular ROS: positive for - dyspnea on exertion, edema, and exercise intolerance, easy fatigue. ?negative for - irregular heartbeat, orthopnea, palpitations, paroxysmal nocturnal dyspnea, rapid heart rate, shortness of breath, or lightheadedness or dizziness, syncope/near syncope or TIA/amaurosis fugax, or claudication. ? ?REVIEWED OF SYSTEMS  ? ?Essentially similar to last year. ?Review of Systems  ?Constitutional:  Positive for malaise/fatigue (Gives out easily.  Does not have the energy to get out and do the yard  work.-"Gives out easily ".  Exercise intolerance and dyspnea.). Negative for weight loss.  ?HENT:  Positive for hearing loss. Negative for congestion and nosebleeds.   ?Eyes:   ?     Did not comment about his

## 2021-05-27 NOTE — Patient Instructions (Addendum)
Medication Instructions:  ? You should only be taking Rosuvastatin  20 mg daily  ( if you do not have please contact  office for refill)  the medication is for reducing cholesterol  ? ? Stop taking Pravastatin 40 mg  ? ?*If you need a refill on your cardiac medications before your next appointment, please call your pharmacy* ? ? ?Lab Work: ?CBC ?CMP ?LIPID ?If you have labs (blood work) drawn today and your tests are completely normal, you will receive your results only by: ?MyChart Message (if you have MyChart) OR ?A paper copy in the mail ?If you have any lab test that is abnormal or we need to change your treatment, we will call you to review the results. ? ? ?Testing/Procedures: ?Will be schedule at [address]  ?Your physician has requested that you have an echocardiogram. Echocardiography is a painless test that uses sound waves to create images of your heart. It provides your doctor with information about the size and shape of your heart and how well your heart?s chambers and valves are working. This procedure takes approximately one hour. There are no restrictions for this procedure. ? ? ? ?Follow-Up: ?At [facility], you and your health needs are our priority.  As part of our continuing mission to provide you with exceptional heart care, we have created designated Provider Care Teams.  These Care Teams include your primary Cardiologist (physician) and Advanced Practice Providers (APPs -  Physician Assistants and Nurse Practitioners) who all work together to provide you with the care you need, when you need it. ? ?We recommend signing up for the patient portal called "MyChart".  Sign up information is provided on this After Visit Summary.  MyChart is used to connect with patients for Virtual Visits (Telemedicine).  Patients are able to view lab/test results, encounter notes, upcoming appointments, etc.  Non-urgent messages can be sent to your provider as well.   ?To learn more about  what you can do with MyChart, go to [email or web address].   ? ?Your next appointment:   ?12 month(s) ? ?The format for your next appointment:   ?In Person ? ?Provider:   ?Glenetta Hew, MD   ? ? ? ?

## 2021-05-27 NOTE — Telephone Encounter (Signed)
Patient requested Rn to contact his daughter to give direction about office visit . ?  RN called and informed daughter Xavier Giles)  of patient office visit  ? ? ?  Aware of  upcoming Echo schedule due to EKG showing  new RBB. Patient  need assistance in checking medication at home  concerning taking cholesterol medication - patient should be taking rosuvastatin 20 mg not Pravastatin 40 mg . ? Labs obtained today  ? F/u office appointment in 12 months . ? ? Daughter verbalized understanding . ? ?  Daughter states patient has been short of breath at rest and activity . She wanted Dr Ellyn Hack to be aware. ? RN did give information to Dr Ellyn Hack .  ?Per Dr Ellyn Hack . This is another reason for during echo. ? ?

## 2021-05-29 ENCOUNTER — Encounter: Payer: Self-pay | Admitting: Cardiology

## 2021-05-29 NOTE — Assessment & Plan Note (Signed)
1 documented true ACS/non-STEMI with the vessel being unstable angina and troponin elevation.  Clear evidence of inferior perfusion defect on Myoview stress test.  I do not think it was diaphragm attenuation. ? ?Not really having any adverse effects, but currently little bit more short of breath. ?

## 2021-05-29 NOTE — Assessment & Plan Note (Addendum)
Not really complaining of symptoms that would suggest angina, just some exertional dyspnea but not really that much above baseline according to him.  He is on a pretty aggressive regimen with beta-blocker, calcium channel blocker and Imdur. ?Currently living is LIMA and vein grafts, 1 of which has 3 stents ? ?Plan: ?? Not on aspirin.  He is on standing maintenance dose of Brilinta 60 mg twice daily ?? Okay to hold Brilinta 5 to 7 days preop for surgeries or procedures. ?

## 2021-05-29 NOTE — Assessment & Plan Note (Signed)
He does not seem to be having his stable angina.  I suspect he may not be as active as he had been.  He is however noticing some dyspnea which his daughter pointed out. ? ?Continue to monitor -= low threshold to re-test Myoview ?For now - checking Echo for EF & valve Evaluation due to New RBBB. ?

## 2021-05-29 NOTE — Assessment & Plan Note (Addendum)
I had switched him from pravastatin to rosuvastatin.  Both meds are listed.  I want him to continue taking rosuvastatin without pravastatin. ? ?We had him recheck labs after clinic: Thankfully, lipids look pretty stable.  I suspect he probably is only taking one of the treatment.  We will simply have him clarify that he is not taking pravastatin. ?Lab Results  05/27/2021  ?Component Value  ? CHOL 111  ? HDL 48  ? [place] 45  ? TRIG 93  ? CHOLHDL 2.3  ? ?

## 2021-05-29 NOTE — Assessment & Plan Note (Signed)
This is a new finding.  Probably benign, however need to ensure that there is no change in overall function.  With him having some dyspnea, I would like to reassess EF, wall motion etc. ? ?Plan: Check 2D echo ?

## 2021-05-29 NOTE — Assessment & Plan Note (Signed)
Notable multivessel disease in both the native artery and grafts. ?Not really truly having significant anginal symptoms.  More so some exertional dyspnea and fatigue. ? ?Plan: ?? On beta-blocker and calcium channel blocker along with Imdur for antianginal effect ?? ACE inhibitor for afterload reduction and blood pressure ?? Confirm that he is only on 1-2 statins.  Preferably rosuvastatin 20 mg. ?? Refill PRN nitroglycerin. ?? With exertional dyspnea, new RBBB, plan is to check 2D echo.  Based on results-plus or minus further ischemic evaluation. ?? On maintenance Brilinta ?

## 2021-05-29 NOTE — Assessment & Plan Note (Signed)
Stressed importance of avoiding dehydration reticulocyte stopped.  He does have some edema which are preferred to treat with foot elevation and support stockings. ?

## 2021-05-29 NOTE — Assessment & Plan Note (Signed)
Last Myoview was in May 2019.  Just about 4 years from last study.  Would not be unreasonable if his dyspnea progresses or if he has true angina to reassess with stress test. ?

## 2021-05-29 NOTE — Assessment & Plan Note (Signed)
Given his issues with orthostatic hypotension, we are allowing for mild permissive hypertension.  Difficult subject when he is having some exertional dyspnea, we will continue to treat with stable doses atenolol, amlodipine/benazepril and Imdur. ?

## 2021-06-06 ENCOUNTER — Telehealth: Payer: Self-pay | Admitting: *Deleted

## 2021-06-06 NOTE — Telephone Encounter (Signed)
Left detail message for daughter on secure voicemail about lab results .Any question can call back  ?

## 2021-06-06 NOTE — Telephone Encounter (Signed)
-----   Message from [name], MD sent at 05/29/2021 11:04 PM EDT ----- ?Chemistry panel is pretty stable.  Kidney and liver function look good.  Blood sugars are high, but stable. ? ?Blood counts are stable-no anemia.  Cholesterol levels also look stable.  Pretty well controlled.  A little up from 8 months ago, but still well within the target range. ? ?All is well. -Just need to work on blood sugar levels. - will add A1c to next labs. ? ?Glenetta Hew, MD ? ? ?

## 2021-06-12 ENCOUNTER — Ambulatory Visit (HOSPITAL_COMMUNITY): Payer: Medicare Other | Attending: Cardiology

## 2021-06-12 DIAGNOSIS — I214 Non-ST elevation (NSTEMI) myocardial infarction: Secondary | ICD-10-CM | POA: Diagnosis present

## 2021-06-12 DIAGNOSIS — Z951 Presence of aortocoronary bypass graft: Secondary | ICD-10-CM | POA: Insufficient documentation

## 2021-06-12 DIAGNOSIS — I4510 Unspecified right bundle-branch block: Secondary | ICD-10-CM | POA: Diagnosis present

## 2021-06-12 DIAGNOSIS — I2510 Atherosclerotic heart disease of native coronary artery without angina pectoris: Secondary | ICD-10-CM | POA: Diagnosis present

## 2021-06-12 DIAGNOSIS — I25709 Atherosclerosis of coronary artery bypass graft(s), unspecified, with unspecified angina pectoris: Secondary | ICD-10-CM | POA: Diagnosis present

## 2021-06-12 LAB — ECHOCARDIOGRAM COMPLETE
Area-P 1/2: 4.21 cm2
MV M vel: 4.37 m/s
MV Peak grad: 76.4 mmHg
S' Lateral: 4.4 cm

## 2021-06-18 ENCOUNTER — Telehealth: Payer: Self-pay | Admitting: *Deleted

## 2021-06-18 NOTE — Telephone Encounter (Signed)
-----   Message from [name], MD sent at 06/14/2021  2:17 PM EDT ----- ?Echocardiogram results shows mildly reduced pump function with decreased wall motion in the inferior wall consistent with prior heart attack.  This is pretty much similar to what stress test have shown in the past. ?Relatively normal valves. ?Mildly increased right atrial pressures which is not consistent findings. ? ?Echo seems to be pretty much what we would expect-unfortunately we had not had one checked in a [facility] time. ? ?I do not think there is any new finding. ? ?Glenetta Hew, MD ? ?

## 2021-06-18 NOTE — Telephone Encounter (Signed)
Left message for  daughter [name] to call back for results - patient is hard of hearing . She gives information to patient. ?

## 2021-06-19 NOTE — Progress Notes (Signed)
?[facility] Note ? ?06/26/2021 ? ?  ? ?CHIEF COMPLAINT ?Patient presents for Retina Follow Up ? ? ? ?HISTORY OF PRESENT ILLNESS: ?Xavier Giles is a 81 y.o. male who presents to the clinic today for:  ? ?HPI   ? ? 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  This started 2 months ago.  I, the attending physician,  performed the HPI with the patient and updated documentation appropriately. ? ?  ?  ?Last edited by Bernarda Caffey, MD on 06/26/2021  8:11 AM.  ?  ? ?pt states vision is stable ? ? ?Referring physician: ?No referring provider defined for this encounter. ? ?HISTORICAL INFORMATION:  ? ?Selected notes from the [facility] ?Referred by Dr. Parke Simmers for concern of ARMD OU  ? ?CURRENT MEDICATIONS: ?No current outpatient medications on file. (Ophthalmic Drugs)  ? ?No current facility-administered medications for this visit. (Ophthalmic Drugs)  ? ?Current Outpatient Medications (Other)  ?Medication Sig  ? amLODipine-benazepril (LOTREL) 10-20 MG capsule TAKE 1 CAPSULE BY MOUTH DAILY  ? atenolol (TENORMIN) 50 MG tablet Take 1 tablet (50 mg total) by mouth daily.  ? isosorbide mononitrate (IMDUR) 60 MG 24 hr tablet Take 1 tablet by mouth once daily  ? nitroGLYCERIN (NITROSTAT) 0.4 MG SL tablet Place 1 tablet (0.4 mg total) under the tongue every 5 (five) minutes as needed (up to 3 doses).  ? pravastatin (PRAVACHOL) 40 MG tablet Take 1 tablet (40 mg total) by mouth daily.  ? rosuvastatin (CRESTOR) 20 MG tablet Take 1 tablet by mouth once daily  ? ticagrelor (BRILINTA) 60 MG TABS tablet Take 1 tablet (60 mg total) by mouth 2 (two) times daily.  ? ?No current facility-administered medications for this visit. (Other)  ? ?REVIEW OF SYSTEMS: ? ? ?ALLERGIES ?Allergies  ?Allergen Reactions  ? Keflex [Cephalexin] Other (See Comments)  ?  Unknown   ? Zocor [Simvastatin] Other (See Comments)  ?  Unknown   ? Lipitor [Atorvastatin] Other (See Comments)  ?  Elevated blood  sugar  ? Sulfa Antibiotics Nausea Only  ? ?PAST MEDICAL HISTORY ?Past Medical History:  ?Diagnosis Date  ? Asthma   ? CAD (coronary artery disease) of bypass graft 2003  ? (now known CTO of SVG-rPDA & SVG-D1 & 3 stents in SeqSVg-Om1-OM2 (1 in prox leg, overlapping DES-BMS from OM1-OM2 into OM2 - s/p PTCA for ISR).; LIMA-LAD patent   ? CAD S/P PTCA-PCI of SeqSVG-OM1-OM2 2003; 01/2012; 05/2013  ? a) '03 SeqSVG-OM1-OM2,  Zeta BMS 3.0x8 Seq limb into OM2), & PTCA of SVg-D1 anastomosis; b) 2013: Distal Limb of SeqSVG-OM1-OM2 from OM1-2 overlap BMS ->Xience DES 2.75 x 15 - 53m;; c) 05/2013 Proxlimb SVG--OM1-OM2 -> Resolute DES 3 x 26 - 3.218m Angiosculpt PTCA of ISR in Seq Limb overlaping stents (3.25 mm)  ? CAD, multiple vessel - Native CAD. 1998; 2015  ? a) Referred for CABGx5 ; by Cath 05/2013: dLM 60%, Ost LAD 70&. Prox -mid LAD extensive 70%( to-fro flow); D1 80%; OM1 & OM2 (CTO), follow-on LCx beyond AVG 90+% with small OM3/OM4; RCA TO (R-R collaterals);  ? Carotid bruit present 01/2012  ? a. asymptomatic; carotid doppler 01/2012 - R bulb/prox ICA mild-mod amt of fibrous plaque, L subclavian 70-99% diameter reduction, L bulb/ICA mild amt of fibrous plaque  ? Chronic low back pain   ? Dyslipidemia, goal LDL below 70   ? Hypertension, essential   ? Hypertensive retinopathy   ?  OU  ? Macular degeneration   ? Dry OD, Wet OS  ? 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 Cath noted 90% stenosis from OM1-OM2 in Seq Limb of SVG-OM1-OM2 --> DES PCI overlaps prior BMS. Also noted CTO of SVG-Diag; b) CRESCENDO ANGINA: 05/2013 - prox limb SVG-OM1-OM2 DES PCI & PTCA of 95% ISR in distal Limb overlapping DES-BMS; Myoview 08/25/13 - small non-T-mural Inf scar, No Ischemia  ? S/P CABG x 5 1998  ? LIMA-LAD, SeqSVG-OM1-OM2; SVG-rPDA, SVG-D1   ? ?Past Surgical History:  ?Procedure Laterality Date  ? CATARACT EXTRACTION Bilateral   ? Dr. Dolores Lory  ? CATARACT EXTRACTION W/ INTRAOCULAR LENS  IMPLANT,  BILATERAL Bilateral   ? CORONARY ANGIOPLASTY WITH STENT PLACEMENT  2003  ? DR LITTLE: BMS 3.0 mm x 8 mm stent to Seq limb anastomosis of SeqSVG-OM1-OM2 into OM2; & cutting balloon SVG-Diag  ? CORONARY ANGIOPLASTY WITH STENT PLACEMENT  06/28/2013  ? Procedure: CORONARY ANGIOPLASTY WITH STENT PLACEMENT ;  Surgeon: Troy Sine, MD;  Location: [facility] CATH LAB;  Service: Cardiovascular: PCI to SVG-CxOM1-OM2 99% prox lesion (Resolute DES 3.0 x 26 - 3.4 mm) & PTCA of  95% ISR in sequential limb (3.0 mm Angiosculpt)   ? CORONARY ARTERY BYPASS GRAFT  1998  ? SVG-diagonal, DVG-OM, DVG-PDA, LIMA-LAD  ? CORONARY STENT INTERVENTION  01/13/2012  ?   Procedure: CORONARY STENT INTERVENTION ;  Surgeon: Lorretta Harp, MD;  Location: [facility] CATH LAB;  Service: Cardiovascular: PCI Seq limb of Seq SVG-OM1-OM2 90% -> 0% - Xience Xpedition DES 2.75 MM x15 MM (3 MM) overlaps prox edge of BMS into OM2  ? ESOPHAGOGASTRODUODENOSCOPY N/A 06/29/2013  ? Procedure: ESOPHAGOGASTRODUODENOSCOPY (EGD);  Surgeon: Beryle Beams, MD;  Location: [facility] ENDOSCOPY;  Service: Endoscopy;  Laterality: N/A;  ? EYE SURGERY Bilateral   ? Cat Sx - Dr. Dolores Lory  ? LEFT HEART CATH AND CORS/GRAFTS ANGIOGRAPHY  2003  ? ABN CARDIOLITE:  100% SVG-PDA, patent LIMA; 80% anastomotic lesion SVG-Diag; 99% anastomotic lesion of Seq limb SVG-OM1-OM2; Native LM OK - 50% & 80% prox LAD, 80% D1; Native OM1 & OM2 TO; Native RCA CTO (R_R collaterals). => PCI of SVG-OM2 & PTCA of SVG-Diag  ? LEFT HEART CATHETERIZATION WITH CORONARY/GRAFT ANGIOGRAM N/A 01/13/2012  ? Procedure: LEFT HEART CATHETERIZATION WITH Beatrix Fetters;  Surgeon: Lorretta Harp, MD;  Location: [facility] CATH LAB;  Service: Cardiovascular: SVG-D1 CTO (new), knosn SVG-RCA CTO.  Native mRCA CTO (R-R collaterals); 50% distal LM, 70-80% prox LAD and 90% mid.  LIMA-LAD patent w/  70% post anastomosis. OM1 &OM2 CTO. Seq limb of SeqSVG-OM1-OM2 90% @ OM1 to BMS into OM2.  ? LEFT HEART CATHETERIZATION WITH CORONARY/GRAFT  ANGIOGRAM N/A 06/28/2013  ? Procedure: LEFT HEART CATHETERIZATION WITH Beatrix Fetters;  Surgeon: Troy Sine, MD;  Location: [facility] CATH LAB;  Service: Cardiovascular: Cres Angina: a) 60% dLM; 70% Ostial LAD; 90% Cx - no OMs seen; 100% RCA;; SVG-D1 & SVG--dRCA occluded; Patent LIMA-LAD with dLAD ~70-80%;; SeqSVG-OM1-OM2: 95% proximal limb, 50% mid & Diffuse ISR in overlapping DES/BMS in Seq Limb OM-OM2  ? NM MYOVIEW LTD  07/2013  ? Test looked good!! Evidence of possible old MI / scar, but nothing to suggest ischemia. Not Gated b/c ectopy .  ? NM MYOVIEW LTD  06/2017  ? Intermediate risk because of reduced EF but stable.  Stable fixed inferior defect consistent with either diaphragmatic attenuation or inferior infarct.  Septal hypokinesis/dyskinesis due to LBBB and prior CABG.  EF is 46%.  Stable compared to 2015 following PCI  ? TONSILLECTOMY    ? ?FAMILY HISTORY ?Family History  ?Problem Relation Age of Onset  ? Cancer Mother   ? Heart attack Father   ? ?SOCIAL HISTORY ?Social History  ? ?Tobacco Use  ? Smoking status: Former  ?  Packs/day: 2.00  ?  Years: 30.00  ?  Pack years: 60.00  ?  Types: Cigarettes  ?  Quit date: 03/02/1990  ?  Years since quitting: 31.3  ? Smokeless tobacco: Never  ?Vaping Use  ? Vaping Use: Never used  ?Substance Use Topics  ? Alcohol use: Yes  ?  Comment: 06/28/2013 "aien't drank in years; used to drink a little beer"  ? Drug use: No  ?  ? ?  ?OPHTHALMIC EXAM: ? ?Base Eye Exam   ? ? Visual Acuity (Snellen - Linear)   ? ?   Right Left  ? Dist [place] CF at 3' 20/60 -2  ? Dist ph [place] NI 20/40 -2  ? ?  ?  ? ? Tonometry (Tonopen, 7:36 AM)   ? ?   Right Left  ? Pressure 13 13  ? ?  ?  ? ? Pupils   ? ?   Dark Light Shape React APD  ? Right 3 2 Round Brisk None  ? Left 3 2 Round Brisk None  ? ?  ?  ? ? Visual Fields (Counting fingers)   ? ?   Left Right  ?  Full Full  ? ?  ?  ? ? Extraocular Movement   ? ?   Right Left  ?  Full, Ortho Full, Ortho  ? ?  ?  ? ? Neuro/Psych   ? ? Oriented x3: Yes  ?  Mood/Affect: Normal  ? ?  ?  ? ? Dilation   ? ? Both eyes: 1.0% Mydriacyl, 2.5% Phenylephrine @ 7:36 AM  ? ?  ?  ? ?  ? ?Slit Lamp and Fundus Exam   ? ? Slit Lamp Exam   ? ?   Right Left  ? Lids/Lashes Dermatocha

## 2021-06-19 NOTE — Telephone Encounter (Signed)
Spoke with pt's daughter and reviewed results.  Daughter appreciative for call.  ?

## 2021-06-19 NOTE — Telephone Encounter (Signed)
Pt's daughter returning call.

## 2021-06-26 ENCOUNTER — Ambulatory Visit (INDEPENDENT_AMBULATORY_CARE_PROVIDER_SITE_OTHER): Payer: Medicare Other | Admitting: Ophthalmology

## 2021-06-26 ENCOUNTER — Encounter (INDEPENDENT_AMBULATORY_CARE_PROVIDER_SITE_OTHER): Payer: Self-pay | Admitting: Ophthalmology

## 2021-06-26 DIAGNOSIS — H35033 Hypertensive retinopathy, bilateral: Secondary | ICD-10-CM

## 2021-06-26 DIAGNOSIS — I10 Essential (primary) hypertension: Secondary | ICD-10-CM

## 2021-06-26 DIAGNOSIS — Z961 Presence of intraocular lens: Secondary | ICD-10-CM

## 2021-06-26 DIAGNOSIS — T8522XS Displacement of intraocular lens, sequela: Secondary | ICD-10-CM

## 2021-06-26 DIAGNOSIS — D3132 Benign neoplasm of left choroid: Secondary | ICD-10-CM

## 2021-06-26 DIAGNOSIS — H353221 Exudative age-related macular degeneration, left eye, with active choroidal neovascularization: Secondary | ICD-10-CM | POA: Diagnosis not present

## 2021-06-26 DIAGNOSIS — H353114 Nonexudative age-related macular degeneration, right eye, advanced atrophic with subfoveal involvement: Secondary | ICD-10-CM

## 2021-06-26 DIAGNOSIS — H4301 Vitreous prolapse, right eye: Secondary | ICD-10-CM

## 2021-07-16 ENCOUNTER — Emergency Department (HOSPITAL_COMMUNITY): Payer: Medicare Other

## 2021-07-16 ENCOUNTER — Encounter (HOSPITAL_COMMUNITY): Payer: Self-pay

## 2021-07-16 ENCOUNTER — Other Ambulatory Visit: Payer: Self-pay

## 2021-07-16 ENCOUNTER — Observation Stay (HOSPITAL_COMMUNITY)
Admission: EM | Admit: 2021-07-16 | Discharge: 2021-07-17 | Disposition: A | Discharge status: Home / Self Care | Payer: Medicare Other | Attending: Infectious Diseases | Admitting: Infectious Diseases

## 2021-07-16 DIAGNOSIS — J440 Chronic obstructive pulmonary disease with acute lower respiratory infection: Secondary | ICD-10-CM | POA: Diagnosis not present

## 2021-07-16 DIAGNOSIS — I2510 Atherosclerotic heart disease of native coronary artery without angina pectoris: Secondary | ICD-10-CM | POA: Diagnosis not present

## 2021-07-16 DIAGNOSIS — Z955 Presence of coronary angioplasty implant and graft: Secondary | ICD-10-CM | POA: Diagnosis not present

## 2021-07-16 DIAGNOSIS — R0602 Shortness of breath: Secondary | ICD-10-CM

## 2021-07-16 DIAGNOSIS — I25708 Atherosclerosis of coronary artery bypass graft(s), unspecified, with other forms of angina pectoris: Secondary | ICD-10-CM | POA: Diagnosis not present

## 2021-07-16 DIAGNOSIS — R778 Other specified abnormalities of plasma proteins: Secondary | ICD-10-CM | POA: Diagnosis not present

## 2021-07-16 DIAGNOSIS — H3530 Unspecified macular degeneration: Secondary | ICD-10-CM | POA: Diagnosis not present

## 2021-07-16 DIAGNOSIS — Z881 Allergy status to other antibiotic agents status: Secondary | ICD-10-CM | POA: Diagnosis not present

## 2021-07-16 DIAGNOSIS — Z7902 Long term (current) use of antithrombotics/antiplatelets: Secondary | ICD-10-CM | POA: Insufficient documentation

## 2021-07-16 DIAGNOSIS — D72829 Elevated white blood cell count, unspecified: Secondary | ICD-10-CM | POA: Diagnosis not present

## 2021-07-16 DIAGNOSIS — I10 Essential (primary) hypertension: Secondary | ICD-10-CM | POA: Diagnosis not present

## 2021-07-16 DIAGNOSIS — R000 Tachycardia, unspecified: Secondary | ICD-10-CM | POA: Diagnosis not present

## 2021-07-16 DIAGNOSIS — H5461 Unqualified visual loss, right eye, normal vision left eye: Secondary | ICD-10-CM | POA: Diagnosis not present

## 2021-07-16 DIAGNOSIS — R3129 Other microscopic hematuria: Secondary | ICD-10-CM | POA: Insufficient documentation

## 2021-07-16 DIAGNOSIS — R0781 Pleurodynia: Secondary | ICD-10-CM | POA: Diagnosis not present

## 2021-07-16 DIAGNOSIS — Z951 Presence of aortocoronary bypass graft: Secondary | ICD-10-CM | POA: Insufficient documentation

## 2021-07-16 DIAGNOSIS — H9190 Unspecified hearing loss, unspecified ear: Secondary | ICD-10-CM | POA: Diagnosis not present

## 2021-07-16 DIAGNOSIS — I5022 Chronic systolic (congestive) heart failure: Secondary | ICD-10-CM

## 2021-07-16 DIAGNOSIS — H35033 Hypertensive retinopathy, bilateral: Secondary | ICD-10-CM | POA: Diagnosis not present

## 2021-07-16 DIAGNOSIS — Z79899 Other long term (current) drug therapy: Secondary | ICD-10-CM | POA: Diagnosis not present

## 2021-07-16 DIAGNOSIS — Z87891 Personal history of nicotine dependence: Secondary | ICD-10-CM | POA: Diagnosis not present

## 2021-07-16 DIAGNOSIS — J189 Pneumonia, unspecified organism: Secondary | ICD-10-CM | POA: Insufficient documentation

## 2021-07-16 DIAGNOSIS — R350 Frequency of micturition: Secondary | ICD-10-CM | POA: Diagnosis not present

## 2021-07-16 DIAGNOSIS — R072 Precordial pain: Secondary | ICD-10-CM | POA: Diagnosis not present

## 2021-07-16 DIAGNOSIS — E785 Hyperlipidemia, unspecified: Secondary | ICD-10-CM | POA: Diagnosis not present

## 2021-07-16 DIAGNOSIS — Z8249 Family history of ischemic heart disease and other diseases of the circulatory system: Secondary | ICD-10-CM | POA: Diagnosis not present

## 2021-07-16 DIAGNOSIS — R0789 Other chest pain: Secondary | ICD-10-CM | POA: Diagnosis present

## 2021-07-16 DIAGNOSIS — Z20822 Contact with and (suspected) exposure to covid-19: Secondary | ICD-10-CM | POA: Diagnosis not present

## 2021-07-16 DIAGNOSIS — J45909 Unspecified asthma, uncomplicated: Secondary | ICD-10-CM | POA: Insufficient documentation

## 2021-07-16 LAB — CBC
HCT: 44 % (ref 39.0–52.0)
Hemoglobin: 14.6 g/dL (ref 13.0–17.0)
MCH: 29.4 pg (ref 26.0–34.0)
MCHC: 33.2 g/dL (ref 30.0–36.0)
MCV: 88.7 fL (ref 80.0–100.0)
Platelets: 255 10*3/uL (ref 150–400)
RBC: 4.96 MIL/uL (ref 4.22–5.81)
RDW: 13.2 % (ref 11.5–15.5)
WBC: 11.9 10*3/uL — ABNORMAL HIGH (ref 4.0–10.5)
nRBC: 0 % (ref 0.0–0.2)

## 2021-07-16 LAB — COMPREHENSIVE METABOLIC PANEL
ALT: 22 U/L (ref 0–44)
AST: 26 U/L (ref 15–41)
Albumin: 3.5 g/dL (ref 3.5–5.0)
Alkaline Phosphatase: 72 U/L (ref 38–126)
Anion gap: 8 (ref 5–15)
BUN: 18 mg/dL (ref 8–23)
CO2: 23 mmol/L (ref 22–32)
Calcium: 9.1 mg/dL (ref 8.9–10.3)
Chloride: 107 mmol/L (ref 98–111)
Creatinine, Ser: 1.16 mg/dL (ref 0.61–1.24)
GFR, Estimated: >60 mL/min (ref >60)
Glucose, Bld: 150 mg/dL — ABNORMAL HIGH (ref 70–99)
Potassium: 4.5 mmol/L (ref 3.5–5.1)
Sodium: 138 mmol/L (ref 135–145)
Total Bilirubin: 1.1 mg/dL (ref 0.3–1.2)
Total Protein: 6.7 g/dL (ref 6.5–8.1)

## 2021-07-16 LAB — I-STAT CHEM 8, ED
BUN: 18 mg/dL (ref 8–23)
Calcium, Ion: 1.13 mmol/L — ABNORMAL LOW (ref 1.15–1.40)
Chloride: 104 mmol/L (ref 98–111)
Creatinine, Ser: 1 mg/dL (ref 0.61–1.24)
Glucose, Bld: 148 mg/dL — ABNORMAL HIGH (ref 70–99)
HCT: 41 % (ref 39.0–52.0)
Hemoglobin: 13.9 g/dL (ref 13.0–17.0)
Potassium: 3.9 mmol/L (ref 3.5–5.1)
Sodium: 136 mmol/L (ref 135–145)
TCO2: 25 mmol/L (ref 22–32)

## 2021-07-16 LAB — RESP PANEL BY RT-PCR (FLU A&B, COVID) ARPGX2
Influenza A by PCR: NEGATIVE
Influenza B by PCR: NEGATIVE
SARS Coronavirus 2 by RT PCR: NEGATIVE

## 2021-07-16 LAB — TROPONIN I (HIGH SENSITIVITY)
Troponin I (High Sensitivity): 28 ng/L — ABNORMAL HIGH (ref <18)
Troponin I (High Sensitivity): 34 ng/L — ABNORMAL HIGH (ref <18)
Troponin I (High Sensitivity): 34 ng/L — ABNORMAL HIGH (ref <18)

## 2021-07-16 LAB — BRAIN NATRIURETIC PEPTIDE: B Natriuretic Peptide: 246.9 pg/mL — ABNORMAL HIGH (ref 0.0–100.0)

## 2021-07-16 LAB — LACTIC ACID, PLASMA: Lactic Acid, Venous: 1.3 mmol/L (ref 0.5–1.9)

## 2021-07-16 LAB — LIPASE, BLOOD: Lipase: 30 U/L (ref 11–51)

## 2021-07-16 IMAGING — DX DG CHEST 1V PORT
1 series · 2 of 2 positions shown · non-contrast
Comparison: Radiographs and CT [DATE].

CLINICAL DATA: Midsternal chest pain radiating to the right side
for 1-2 weeks. Shortness of breath.

EXAM:
PORTABLE CHEST 1 VIEW

[Series 1: chest · 0.14mm/px · 2 of 2 slices shown]
[im 1/2]
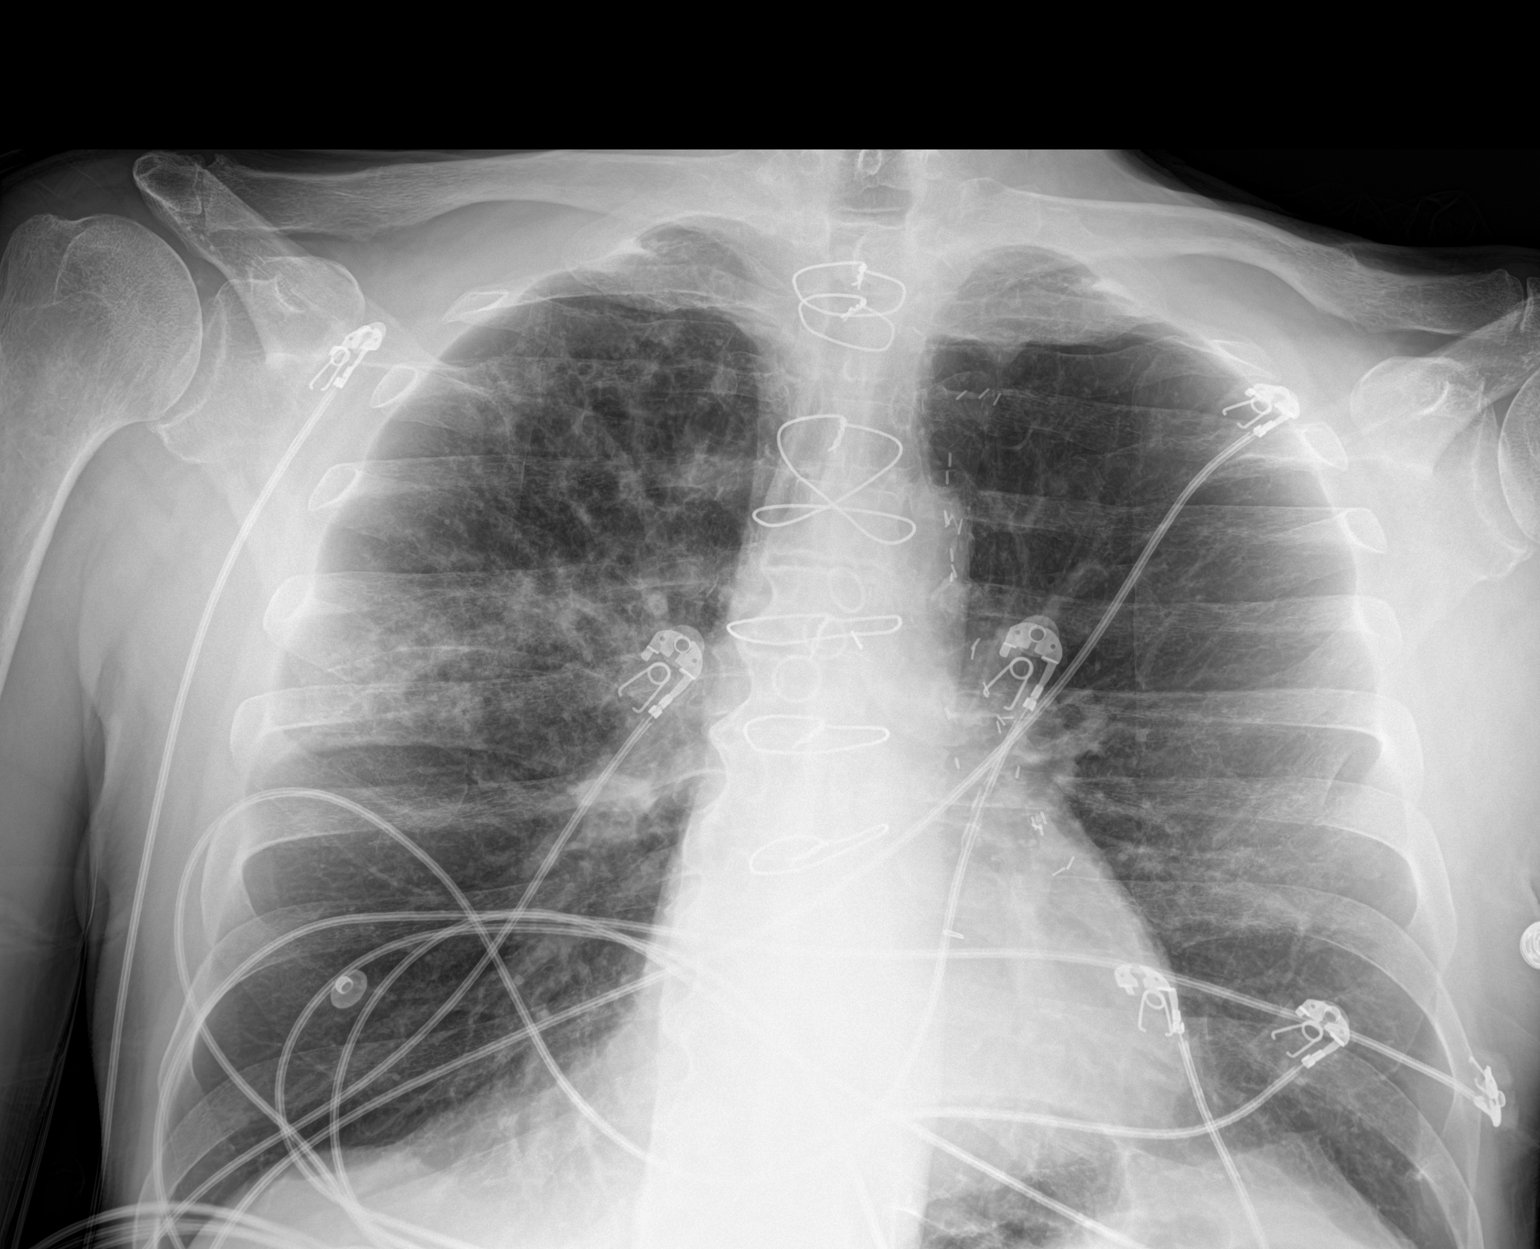
[im 2/2]
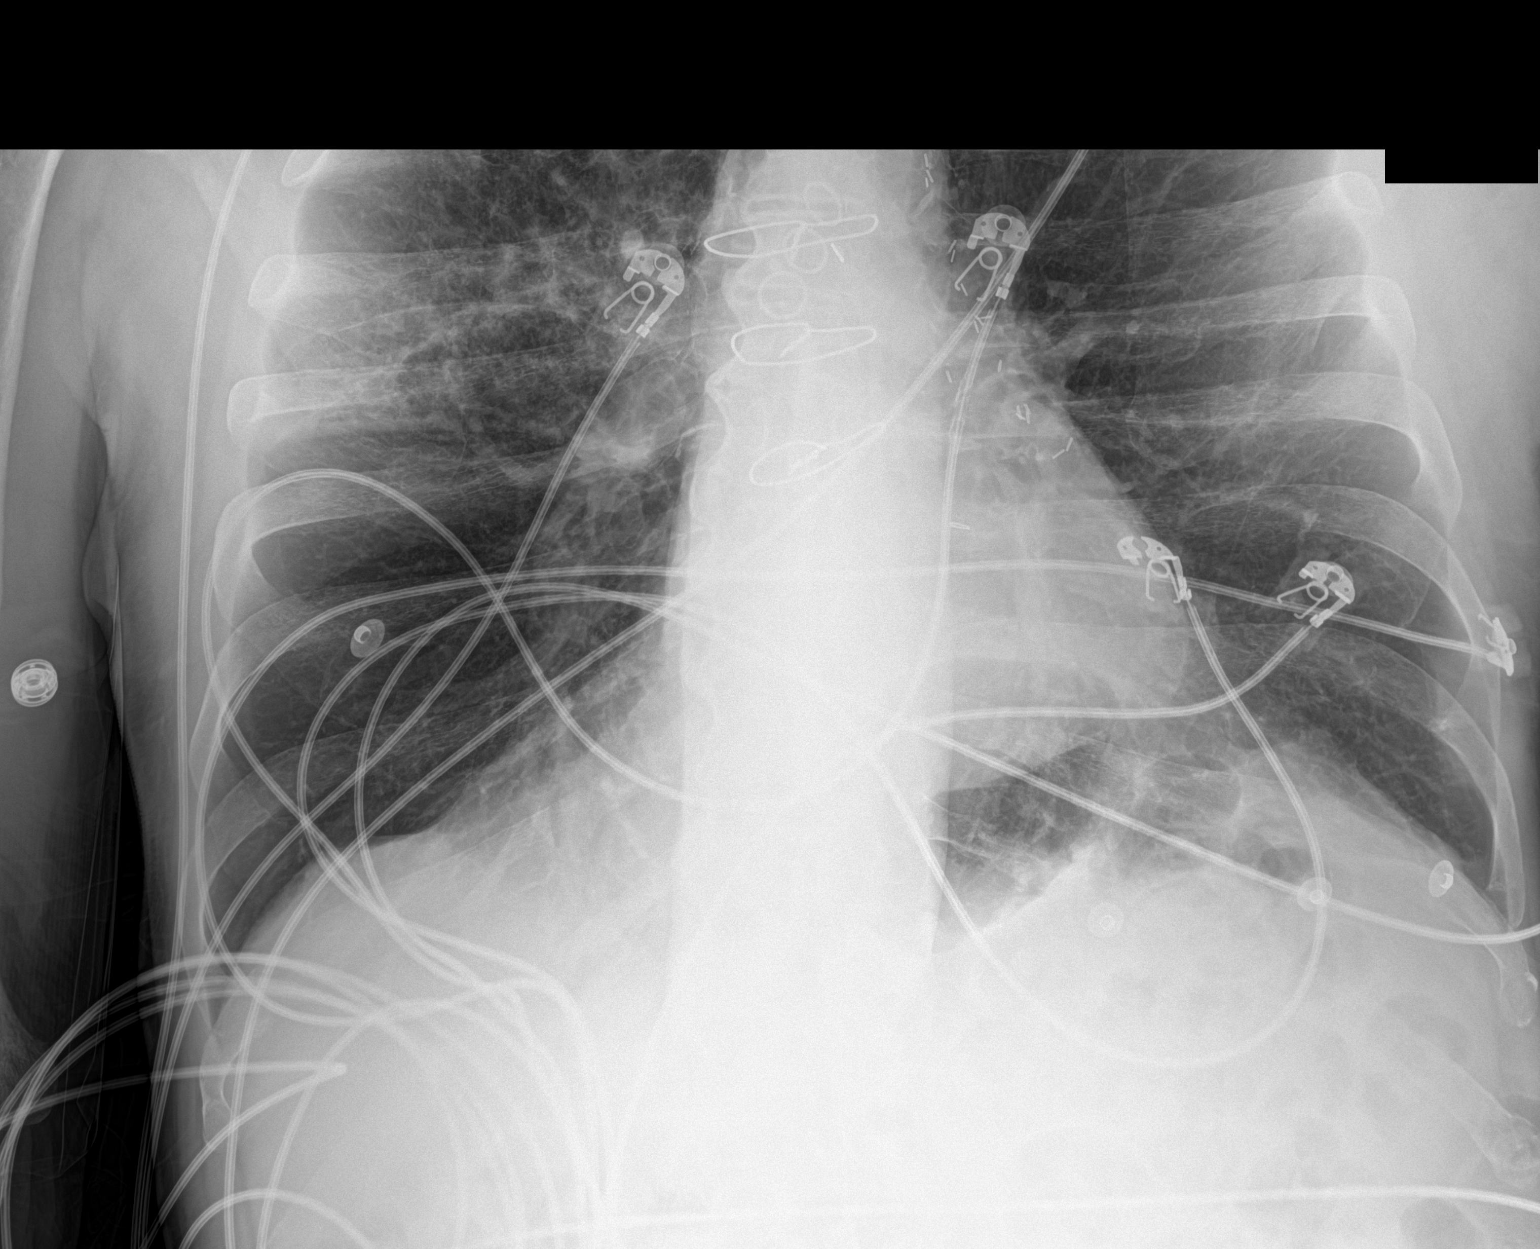

[2 of 2 positions shown; findings below may reference images not displayed]

FINDINGS: [ZZ] hours. Two views were obtained. Lordotic positioning. The heart
size and mediastinal contours are stable status post median
sternotomy and CABG. The lungs are hyperinflated. There are new
patchy airspace opacities within the right upper lobe, suspicious
for pneumonia. The left lung appears clear. There is no pleural
effusion or pneumothorax.

No acute osseous findings are evident. There are mild degenerative
changes in the spine. Telemetry leads overlie the chest.
IMPRESSION: 1. Patchy right upper lobe airspace opacities suspicious for
pneumonia. Followup PA and lateral chest X-ray is recommended in 3-4
weeks following appropriate therapy to ensure resolution and exclude
underlying malignancy.
2. Postop CABG. Evidence of underlying chronic obstructive pulmonary
disease.

## 2021-07-16 MED ORDER — ENOXAPARIN SODIUM 40 MG/0.4ML IJ SOSY
40.0000 mg | PREFILLED_SYRINGE | INTRAMUSCULAR | Status: DC
  Administered 2021-07-16: 40 mg via SUBCUTANEOUS
  Filled 2021-07-16: qty 0.4

## 2021-07-16 MED ORDER — TICAGRELOR 60 MG PO TABS
60.0000 mg | ORAL_TABLET | Freq: Two times a day (BID) | ORAL | Status: DC
  Administered 2021-07-17: 60 mg via ORAL
  Filled 2021-07-16 (×2): qty 1

## 2021-07-16 MED ORDER — LEVOFLOXACIN IN D5W 750 MG/150ML IV SOLN
750.0000 mg | Freq: Once | INTRAVENOUS | Status: AC
  Administered 2021-07-16: 750 mg via INTRAVENOUS
  Filled 2021-07-16: qty 150

## 2021-07-16 MED ORDER — ONDANSETRON HCL 4 MG/2ML IJ SOLN
4.0000 mg | Freq: Once | INTRAMUSCULAR | Status: AC
  Administered 2021-07-16: 4 mg via INTRAVENOUS
  Filled 2021-07-16: qty 2

## 2021-07-16 MED ORDER — LEVOFLOXACIN IN D5W 500 MG/100ML IV SOLN
500.0000 mg | INTRAVENOUS | Status: DC
  Filled 2021-07-16: qty 100

## 2021-07-16 MED ORDER — AMLODIPINE BESYLATE 5 MG PO TABS
10.0000 mg | ORAL_TABLET | Freq: Every day | ORAL | Status: DC
  Administered 2021-07-17: 10 mg via ORAL
  Filled 2021-07-16: qty 2

## 2021-07-16 MED ORDER — MORPHINE SULFATE (PF) 2 MG/ML IV SOLN
2.0000 mg | Freq: Once | INTRAVENOUS | Status: AC
  Administered 2021-07-16: 2 mg via INTRAVENOUS
  Filled 2021-07-16: qty 1

## 2021-07-16 MED ORDER — BENAZEPRIL HCL 20 MG PO TABS
20.0000 mg | ORAL_TABLET | Freq: Every day | ORAL | Status: DC
  Administered 2021-07-17: 20 mg via ORAL
  Filled 2021-07-16: qty 1

## 2021-07-16 MED ORDER — ACETAMINOPHEN 325 MG PO TABS
650.0000 mg | ORAL_TABLET | Freq: Four times a day (QID) | ORAL | Status: DC | PRN
Start: 2021-07-16 — End: 2021-07-17

## 2021-07-16 MED ORDER — SENNOSIDES-DOCUSATE SODIUM 8.6-50 MG PO TABS: 1.0000 | ORAL_TABLET | Freq: Every evening | ORAL | Status: DC | PRN

## 2021-07-16 MED ORDER — ACETAMINOPHEN 650 MG RE SUPP: 650.0000 mg | Freq: Four times a day (QID) | RECTAL | Status: DC | PRN

## 2021-07-16 MED ORDER — ROSUVASTATIN CALCIUM 20 MG PO TABS
20.0000 mg | ORAL_TABLET | Freq: Every day | ORAL | Status: DC
Start: 2021-07-16 — End: 2021-07-17
  Administered 2021-07-16 – 2021-07-17 (×2): 20 mg via ORAL
  Filled 2021-07-16 (×2): qty 1

## 2021-07-16 MED ORDER — ISOSORBIDE MONONITRATE ER 30 MG PO TB24
60.0000 mg | ORAL_TABLET | Freq: Every day | ORAL | Status: DC
  Administered 2021-07-17: 60 mg via ORAL
  Filled 2021-07-16: qty 2

## 2021-07-16 MED ORDER — ATENOLOL 50 MG PO TABS
50.0000 mg | ORAL_TABLET | Freq: Every day | ORAL | Status: DC
  Administered 2021-07-17: 50 mg via ORAL
  Filled 2021-07-16: qty 1

## 2021-07-16 NOTE — Consult Note (Signed)
?Cardiology Consultation:  ? ?Patient ID: Xavier Giles ?MRN: [id number]; DOB: 1940/04/11 ? ?Admit date: 07/16/2021 ?Date of Consult: 07/16/2021 ? ?PCP:  [name], MD ?  ?[facility] Providers ?Cardiologist:  Glenetta Hew, MD      ? ? ?Patient Profile:  ? ?Xavier Giles is a 81 y.o. male with a hx of hard of hearing, HTN, HLD, orthostatic hypotension, RBBB, former tobacco abuse quit 30 years ago and history of CAD s/p CABG 1998 (LIMA-LAD, SVG-OM1-OM 2, SVG-RPDA, SVG-D1) who is being seen 07/16/2021 for the evaluation of chest pain at the request of Dr. Ashok Cordia. ? ?History of Present Illness:  ? ?Xavier Giles is a 81 year old male with past medical history of hard of hearing, HTN, HLD, orthostatic hypotension, RBBB, former tobacco abuse quit 30 years ago and history of CAD s/p CABG 1998 (LIMA-LAD, SVG-OM1-OM 2, SVG-RPDA, SVG-D1).  Patient had abnormal Myoview in 2003 and underwent PCI of SVG-OM 2-OM 3, and PTCA of negative D1 through SVG.  Patient was noted to have CTO of SVG-RCA and patent LIMA.  He had NSTEMI in November 2013, cardiac catheterization revealed CTO of SVG-RCA and SVG-diagonal, severe disease in sequential SVG-OM1-OM 2 treated with DES that was placed just above the prior BMS.  Patient had unstable angina in April 2015, cardiac catheterization demonstrated 99% lesion in SVG-OM1-OM 2 treated with Onyx DES, patient also had in-stent restenosis in the sequential limb treated with PTCA.  Subsequent Myoview in June 2015 showed evidence of possible old MI/scar but no ischemia.  Most recent Myoview obtained on 07/14/2017 showed EF 46%, septal hypokinesis consistent with LBBB and a prior CABG, fixed inferior defect most consistent with diaphragmatic attenuation which was also seen on the 2015 Myoview, cannot rule out prior infarct.  Patient was last seen by [name] on 05/27/2021 at which time he denied any chest pain, however EKG showed a new right bundle branch block.  Repeat echocardiogram was  ordered for 06/12/2021 that showed EF 45-50%, grade 1 DD, hypokinesis of the LV, entire inferior wall and inferolateral wall, RVSP 33.6 mmHg, mild MR, mild to moderate TR.  ? ?For the past few days, patient has been noticing a lower chest/epigastric pain that radiates to the right lower chest area.  The chest discomfort also radiated to the back.  It has been many years since his last PCI and he does not remember his anginal symptom.  He recently had someone else cut down one of the tree in his backyard and has been picking the branches off of the backyard himself.  He has been noticing worsening shortness of breath and chest discomfort.  Interestingly, he also mentions significant chest hurts every time he takes a deep breath.  He denies any fever or chill.  On arrival, he had some mild wheezing, temperature was 99.5.  First high-sensitivity troponin was 28.  White blood cell count 11.9.  Lactic acid 1.3.  Chest x-ray showed patchy right upper lobe airspace opacity, suspicious for pneumonia.  Blood culture has been obtained.  Cardiology service consulted for chest pain. ? ? ? ?Past Medical History:  ?Diagnosis Date  ? Asthma   ? CAD (coronary artery disease) of bypass graft 2003  ? (now known CTO of SVG-rPDA & SVG-D1 & 3 stents in SeqSVg-Om1-OM2 (1 in prox leg, overlapping DES-BMS from OM1-OM2 into OM2 - s/p PTCA for ISR).; LIMA-LAD patent   ? CAD S/P PTCA-PCI of SeqSVG-OM1-OM2 2003; 01/2012; 05/2013  ? a) '03 SeqSVG-OM1-OM2,  Zeta BMS 3.0x8  Seq limb into OM2), & PTCA of SVg-D1 anastomosis; b) 2013: Distal Limb of SeqSVG-OM1-OM2 from OM1-2 overlap BMS ->Xience DES 2.75 x 15 - 40m;; c) 05/2013 Proxlimb SVG--OM1-OM2 -> Resolute DES 3 x 26 - 3.218m Angiosculpt PTCA of ISR in Seq Limb overlaping stents (3.25 mm)  ? CAD, multiple vessel - Native CAD. 1998; 2015  ? a) Referred for CABGx5 ; by Cath 05/2013: dLM 60%, Ost LAD 70&. Prox -mid LAD extensive 70%( to-fro flow); D1 80%; OM1 & OM2 (CTO), follow-on LCx beyond AVG 90+%  with small OM3/OM4; RCA TO (R-R collaterals);  ? Carotid bruit present 01/2012  ? a. asymptomatic; carotid doppler 01/2012 - R bulb/prox ICA mild-mod amt of fibrous plaque, L subclavian 70-99% diameter reduction, L bulb/ICA mild amt of fibrous plaque  ? Chronic low back pain   ? Dyslipidemia, goal LDL below 70   ? Hypertension, essential   ? Hypertensive retinopathy   ? OU  ? Macular degeneration   ? Dry OD, Wet OS  ? 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 Cath noted 90% stenosis from OM1-OM2 in Seq Limb of SVG-OM1-OM2 --> DES PCI overlaps prior BMS. Also noted CTO of SVG-Diag; b) CRESCENDO ANGINA: 05/2013 - prox limb SVG-OM1-OM2 DES PCI & PTCA of 95% ISR in distal Limb overlapping DES-BMS; Myoview 08/25/13 - small non-T-mural Inf scar, No Ischemia  ? S/P CABG x 5 1998  ? LIMA-LAD, SeqSVG-OM1-OM2; SVG-rPDA, SVG-D1   ? ? ?Past Surgical History:  ?Procedure Laterality Date  ? CATARACT EXTRACTION Bilateral   ? Dr. EpDolores Lory? CATARACT EXTRACTION W/ INTRAOCULAR LENS  IMPLANT, BILATERAL Bilateral   ? CORONARY ANGIOPLASTY WITH STENT PLACEMENT  2003  ? DR LITTLE: BMS 3.0 mm x 8 mm stent to Seq limb anastomosis of SeqSVG-OM1-OM2 into OM2; & cutting balloon SVG-Diag  ? CORONARY ANGIOPLASTY WITH STENT PLACEMENT  06/28/2013  ? Procedure: CORONARY ANGIOPLASTY WITH STENT PLACEMENT ;  Surgeon: ThTroy SineMD;  [facility];  Service: Cardiovascular: PCI to SVG-CxOM1-OM2 99% prox lesion (Resolute DES 3.0 x 26 - 3.4 mm) & PTCA of  95% ISR in sequential limb (3.0 mm Angiosculpt)   ? CORONARY ARTERY BYPASS GRAFT  1998  ? SVG-diagonal, DVG-OM, DVG-PDA, LIMA-LAD  ? CORONARY STENT INTERVENTION  01/13/2012  ?   Procedure: CORONARY STENT INTERVENTION ;  Surgeon: JoLorretta HarpMD;  [facility];  Service: Cardiovascular: PCI Seq limb of Seq SVG-OM1-OM2 90% -> 0% - Xience Xpedition DES 2.75 MM x15 MM (3 MM) overlaps prox edge of BMS into OM2  ? ESOPHAGOGASTRODUODENOSCOPY N/A  06/29/2013  ? Procedure: ESOPHAGOGASTRODUODENOSCOPY (EGD);  Surgeon: PaBeryle BeamsMD;  [facility];  Service: Endoscopy;  Laterality: N/A;  ? EYE SURGERY Bilateral   ? Cat Sx - Dr. EpDolores Lory? LEFT HEART CATH AND CORS/GRAFTS ANGIOGRAPHY  2003  ? ABN CARDIOLITE:  100% SVG-PDA, patent LIMA; 80% anastomotic lesion SVG-Diag; 99% anastomotic lesion of Seq limb SVG-OM1-OM2; Native LM OK - 50% & 80% prox LAD, 80% D1; Native OM1 & OM2 TO; Native RCA CTO (R_R collaterals). => PCI of SVG-OM2 & PTCA of SVG-Diag  ? LEFT HEART CATHETERIZATION WITH CORONARY/GRAFT ANGIOGRAM N/A 01/13/2012  ? Procedure: LEFT HEART CATHETERIZATION WITH COBeatrix Fetters Surgeon: JoLorretta HarpMD;  [facility];  Service: Cardiovascular: SVG-D1 CTO (new), knosn SVG-RCA CTO.  Native mRCA CTO (R-R collaterals); 50% distal LM, 70-80% prox LAD and 90% mid.  LIMA-LAD patent  w/  70% post anastomosis. OM1 &OM2 CTO. Seq limb of SeqSVG-OM1-OM2 90% @ OM1 to BMS into OM2.  ? LEFT HEART CATHETERIZATION WITH CORONARY/GRAFT ANGIOGRAM N/A 06/28/2013  ? Procedure: LEFT HEART CATHETERIZATION WITH Beatrix Fetters;  Surgeon: Troy Sine, MD;  Location: [facility] CATH LAB;  Service: Cardiovascular: Cres Angina: a) 60% dLM; 70% Ostial LAD; 90% Cx - no OMs seen; 100% RCA;; SVG-D1 & SVG--dRCA occluded; Patent LIMA-LAD with dLAD ~70-80%;; SeqSVG-OM1-OM2: 95% proximal limb, 50% mid & Diffuse ISR in overlapping DES/BMS in Seq Limb OM-OM2  ? NM MYOVIEW LTD  07/2013  ? Test looked good!! Evidence of possible old MI / scar, but nothing to suggest ischemia. Not Gated b/c ectopy .  ? NM MYOVIEW LTD  06/2017  ? Intermediate risk because of reduced EF but stable.  Stable fixed inferior defect consistent with either diaphragmatic attenuation or inferior infarct.  Septal hypokinesis/dyskinesis due to LBBB and prior CABG.  EF is 46%.  Stable compared to 2015 following PCI  ? TONSILLECTOMY    ?  ? ?Home Medications:  ?Prior to Admission medications    ?Medication Sig Start Date End Date Taking? Authorizing Provider  ?amLODipine-benazepril (LOTREL) 10-20 MG capsule TAKE 1 CAPSULE BY MOUTH DAILY 08/06/20   [name], MD  ?atenolol (TENORMIN) 50 MG tablet

## 2021-07-16 NOTE — Hospital Course (Addendum)
Mr. 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 ? ?The patient presents here today with chest pain, which started several days ago. He says the CP started about a week ago and has slowly been worsening since its onset.  The pain is most pronounced at the lower anterior sternum and right lower chest as well as in his back.  He describes the pain as sharp and most pronounced when he takes breaths in.  He has had associated cough productive of clear sputum and has felt more short of breath and noticed that he gets tired more quickly.  He has had no fevers but woke up this AM and noticed that his sheets were soaked in sweat. He has never had pneumonia before. He denies dysphagia or GERD. He also denies headache, acute worsening of his chronic vision problems, diarrhea, or constipation. He has had urinary frequency but states this is a chronic issue for him.  ? ? ?Describes it as pleuritic. Walking around quickly makes the CP worse. Noticed he gets tired easily. For the past week, he has had a cough productive of clear sputum. No fevers(?). When he got up this morning, the sheets and pillow were soaked in sweat, and he felt his legs cramping up on him. Feels SOB currently but does not wear any oxygen at home. Has never had pneumonia before. Has never had reflux. He has been able to eat and drink well for the past week. No recent changes in vision. Denies diarrhea, constipation, dysphagia. His last BM was this morning. States he has to get up in the middle of the night a few times to urinate, but this is a chronic issue for him. ? ? ? ? ?Shx: Used to smoke for about 25-30 years, about 1 ppd. He quit smoking in 1992.  ?PCP: Dr.  Ellyn Hack (?) ? ? ? ?PMHx: ?Has macular degeneration ?

## 2021-07-16 NOTE — Progress Notes (Signed)
Pharmacy Antibiotic Note ? ?Xavier Giles is a 81 y.o. male admitted on 07/16/2021 with pneumonia.  Pharmacy has been consulted for Levaquin dosing. ? ?Pt was admitted for CP. He was found to have PNA rather than ACS. He has some unclear allergy documentation. Levaquin has been started. We will try to clarify allergy in AM to avoid the use of quinolones in the elderly. ? ?Scr 1 ? ?Plan: ?Levaquin '750mg'$  IV x1 then '500mg'$  IV qday ? ?Height: '5\' 11"'$  (180.3 cm) ?Weight: 77.3 kg (170 lb 6.7 oz) ?IBW/kg (Calculated) : 75.3 ? ?Temp (24hrs), Avg:100 ?F (37.8 ?C), Min:99.5 ?F (37.5 ?C), Max:100.5 ?F (38.1 ?C) ? ?Recent Labs  ?Lab 07/16/21 ?1512 07/16/21 ?1525 07/16/21 ?1533  ?WBC 11.9*  --   --   ?CREATININE 1.16  --  1.00  ?LATICACIDVEN  --  1.3  --   ?  ?Estimated Creatinine Clearance: 60.7 mL/min (by C-G formula based on SCr of 1 mg/dL).   ? ?Allergies  ?Allergen Reactions  ? Keflex [Cephalexin] Other (See Comments)  ?  Unknown   ? Zocor [Simvastatin] Other (See Comments)  ?  Unknown   ? Lipitor [Atorvastatin] Other (See Comments)  ?  Elevated blood sugar  ? Sulfa Antibiotics Nausea Only  ? ? ?Antimicrobials this admission: ?5/17 levaquin>> ? ?Dose adjustments this admission: ? ? ?Microbiology results: ?COVID neg ?5/17 blood>> ? ?Onnie Boer, PharmD, BCIDP, AAHIVP, CPP ?Infectious Disease Pharmacist ?07/16/2021 8:45 PM ? ? ? ?

## 2021-07-16 NOTE — ED Provider Notes (Signed)
?[facility] ?Provider Note ? ? ?CSN: [id number] ?Arrival date & time: 07/16/21  1507 ? ?  ? ?History ? ?Chief Complaint  ?Patient presents with  ? Chest Pain  ? ? ?Xavier Giles is a 81 y.o. male. ? ?Pt with hx cad, indicates cabg ~ 20 yrs ago, notes increased mid chest pain in past week. States has been recurrent, and especially persistent in past two days. States mid chest pain all day today since shortly after getting up, mid chest, dull, non radiating, not pleuritic. +sob. No nv. +diaphoresis. Occasional non prod cough. No sore throat or other uri symptoms. No fever or chills. No leg pain or swelling. Pt limited historian, and hoh - level 5 caveat. EMS noted pt hypertensive on arrival, bp 180/130, did give asa and ntg x 2 with improvement in bp  - pain improved but persists.  ? ?The history is provided by the patient, medical records and the EMS personnel. The history is limited by the condition of the patient.  ?Chest Pain ?Associated symptoms: cough, diaphoresis and shortness of breath   ?Associated symptoms: no abdominal pain, no back pain, no fever, no headache and no vomiting   ? ?  ? ?Home Medications ?Prior to Admission medications   ?Medication Sig Start Date End Date Taking? Authorizing Provider  ?amLODipine-benazepril (LOTREL) 10-20 MG capsule TAKE 1 CAPSULE BY MOUTH DAILY 08/06/20   [name], MD  ?atenolol (TENORMIN) 50 MG tablet Take 1 tablet (50 mg total) by mouth daily. 04/02/21   [name], MD  ?isosorbide mononitrate (IMDUR) 60 MG 24 hr tablet Take 1 tablet by mouth once daily 01/27/21   [name], MD  ?nitroGLYCERIN (NITROSTAT) 0.4 MG SL tablet Place 1 tablet (0.4 mg total) under the tongue every 5 (five) minutes as needed (up to 3 doses). 06/30/13   Dunn, Nedra Hai, [name]  ?pravastatin (PRAVACHOL) 40 MG tablet Take 1 tablet (40 mg total) by mouth daily. 06/04/20   [name], MD  ?rosuvastatin (CRESTOR) 20 MG tablet Take 1 tablet by mouth  once daily 10/01/20   [name], MD  ?ticagrelor (BRILINTA) 60 MG TABS tablet Take 1 tablet (60 mg total) by mouth 2 (two) times daily. 07/22/18   [name], MD  ?   ? ?Allergies    ?Keflex [cephalexin], Zocor [simvastatin], Lipitor [atorvastatin], and Sulfa antibiotics   ? ?Review of Systems   ?Review of Systems  ?Constitutional:  Positive for diaphoresis. Negative for chills and fever.  ?HENT:  Negative for sore throat.   ?Eyes:  Negative for redness.  ?Respiratory:  Positive for cough and shortness of breath.   ?Cardiovascular:  Positive for chest pain.  ?Gastrointestinal:  Negative for abdominal pain, diarrhea and vomiting.  ?Genitourinary:  Negative for dysuria and flank pain.  ?Musculoskeletal:  Negative for back pain and neck pain.  ?Skin:  Negative for rash.  ?Neurological:  Negative for headaches.  ?Hematological:  Does not bruise/bleed easily.  ?Psychiatric/Behavioral:  Negative for confusion.   ? ?Physical Exam ?Updated Vital Signs ?BP (!) 165/91   Pulse 99   Temp 99.5 ?F (37.5 ?C) (Axillary)   Resp (!) 26   Ht 1.803 m ('5\' 11"'$ )   Wt 77.3 kg   SpO2 98%   BMI 23.77 kg/m?  ?Physical Exam ?Vitals and nursing note reviewed.  ?Constitutional:   ?   Appearance: Normal appearance. He is well-developed.  ?HENT:  ?   Head: Atraumatic.  ?  Nose: Nose normal.  ?   Mouth/Throat:  ?   Mouth: Mucous membranes are moist.  ?   Pharynx: Oropharynx is clear.  ?Eyes:  ?   General: No scleral icterus. ?   Conjunctiva/sclera: Conjunctivae normal.  ?Neck:  ?   Trachea: No tracheal deviation.  ?Cardiovascular:  ?   Rate and Rhythm: Normal rate and regular rhythm.  ?   Pulses: Normal pulses.  ?   Heart sounds: Normal heart sounds. No murmur heard. ?  No friction rub. No gallop.  ?Pulmonary:  ?   Effort: Pulmonary effort is normal. No accessory muscle usage or respiratory distress.  ?   Breath sounds: Rhonchi present.  ?   Comments: +mildly increased wob ?Chest:  ?   Chest wall: No tenderness.  ?Abdominal:  ?    General: Bowel sounds are normal. There is no distension.  ?   Palpations: Abdomen is soft. There is no mass.  ?   Tenderness: There is no abdominal tenderness. There is no guarding.  ?Genitourinary: ?   Comments: No cva tenderness. ?Musculoskeletal:     ?   General: No swelling or tenderness.  ?   Cervical back: Normal range of motion and neck supple. No rigidity.  ?   Right lower leg: No edema.  ?   Left lower leg: No edema.  ?Skin: ?   General: Skin is warm and dry.  ?   Findings: No rash.  ?Neurological:  ?   Mental Status: He is alert.  ?   Comments: Alert, speech clear.   ?Psychiatric:     ?   Mood and Affect: Mood normal.  ? ? ?ED Results / Procedures / Treatments   ?Labs ?(all labs ordered are listed, but only abnormal results are displayed) ?Results for orders placed or performed during the hospital encounter of 07/16/21  ?Comprehensive metabolic panel  ?Result Value Ref Range  ? Sodium 138 135 - 145 mmol/L  ? Potassium 4.5 3.5 - 5.1 mmol/L  ? Chloride 107 98 - 111 mmol/L  ? CO2 23 22 - 32 mmol/L  ? Glucose, Bld 150 (H) 70 - 99 mg/dL  ? BUN 18 8 - 23 mg/dL  ? Creatinine, Ser 1.16 0.61 - 1.24 mg/dL  ? Calcium 9.1 8.9 - 10.3 mg/dL  ? Total Protein 6.7 6.5 - 8.1 g/dL  ? Albumin 3.5 3.5 - 5.0 g/dL  ? AST 26 15 - 41 U/L  ? ALT 22 0 - 44 U/L  ? Alkaline Phosphatase 72 38 - 126 U/L  ? Total Bilirubin 1.1 0.3 - 1.2 mg/dL  ? GFR, Estimated >60 >60 mL/min  ? Anion gap 8 5 - 15  ?Brain natriuretic peptide  ?Result Value Ref Range  ? B Natriuretic Peptide 246.9 (H) 0.0 - 100.0 pg/mL  ?CBC  ?Result Value Ref Range  ? WBC 11.9 (H) 4.0 - 10.5 K/uL  ? RBC 4.96 4.22 - 5.81 MIL/uL  ? Hemoglobin 14.6 13.0 - 17.0 g/dL  ? HCT 44.0 39.0 - 52.0 %  ? MCV 88.7 80.0 - 100.0 fL  ? MCH 29.4 26.0 - 34.0 pg  ? MCHC 33.2 30.0 - 36.0 g/dL  ? RDW 13.2 11.5 - 15.5 %  ? Platelets 255 150 - 400 K/uL  ? nRBC 0.0 0.0 - 0.2 %  ?Lactic acid, plasma  ?Result Value Ref Range  ? Lactic Acid, Venous 1.3 0.5 - 1.9 mmol/L  ?Lipase, blood  ?Result Value  Ref Range  ? Lipase 30  11 - 51 U/L  ?I-stat chem 8, ED (not at [facility] or [facility])  ?Result Value Ref Range  ? Sodium 136 135 - 145 mmol/L  ? Potassium 3.9 3.5 - 5.1 mmol/L  ? Chloride 104 98 - 111 mmol/L  ? BUN 18 8 - 23 mg/dL  ? Creatinine, Ser 1.00 0.61 - 1.24 mg/dL  ? Glucose, Bld 148 (H) 70 - 99 mg/dL  ? Calcium, Ion 1.13 (L) 1.15 - 1.40 mmol/L  ? TCO2 25 22 - 32 mmol/L  ? Hemoglobin 13.9 13.0 - 17.0 g/dL  ? HCT 41.0 39.0 - 52.0 %  ?Troponin I (High Sensitivity)  ?Result Value Ref Range  ? Troponin I (High Sensitivity) 28 (H) <18 ng/L  ? ?DG Chest Port 1 View ? ?Result Date: 07/16/2021 ?CLINICAL DATA:  Midsternal chest pain radiating to the right side for 1-2 weeks. Shortness of breath. EXAM: PORTABLE CHEST 1 VIEW COMPARISON:  Radiographs and CT 06/28/2013. FINDINGS: 1528 hours. Two views were obtained. Lordotic positioning. The heart size and mediastinal contours are stable status post median sternotomy and CABG. The lungs are hyperinflated. There are new patchy airspace opacities within the right upper lobe, suspicious for pneumonia. The left lung appears clear. There is no pleural effusion or pneumothorax. No acute osseous findings are evident. There are mild degenerative changes in the spine. Telemetry leads overlie the chest. IMPRESSION: 1. Patchy right upper lobe airspace opacities suspicious for pneumonia. Followup PA and lateral chest X-ray is recommended in 3-4 weeks following appropriate therapy to ensure resolution and exclude underlying malignancy. 2. Postop CABG. Evidence of underlying chronic obstructive pulmonary disease. Electronically Signed   By: Richardean Sale M.D.   On: 07/16/2021 15:40  ? ?OCT, Retina - OU - Both Eyes ? ?Result Date: 06/26/2021 ?Right Eye Quality was borderline. Central Foveal Thickness: 210. Progression has been stable. Findings include abnormal foveal contour, outer retinal atrophy, subretinal hyper-reflective material, pigment epithelial detachment, retinal drusen , no SRF, no  IRF (Persistent ORA / SRHM, trace cystic changes temporal macula). Left Eye Quality was borderline. Central Foveal Thickness: 257. Progression has been stable. Findings include no SRF, outer retinal atrophy,

## 2021-07-16 NOTE — H&P (Signed)
Date: 07/16/2021               Patient Name:  Xavier Giles MRN: [id number]  DOB: 1940/08/28 Age / Sex: 81 y.o., male   PCP: [name], MD         Medical Service: Internal Medicine Teaching Service         Attending Physician: Dr. Campbell Riches, MD    First Contact: Dr. Mitzie Na, MD  Pager: [phone]  Second Contact: Dr. Rick Duff, MD  Pager: [phone]       After Hours (After 5p/  First Contact Pager: [phone]  weekends / holidays): Second Contact Pager: [phone]   Chief Complaint: pleuritic chest pain   History of Present Illness:   Mr. 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  The patient presents here today with chest pain, which started several days ago. He says the CP started about a week ago and has slowly been worsening since its onset.  The pain is most pronounced at the lower anterior sternum and right lower chest as well as in his back.  He describes the pain as sharp and most pronounced when he takes breaths in.  He has had associated cough productive of clear sputum and has felt more short of breath and noticed that he gets tired more quickly.  He has had no fevers but woke up this AM and noticed that his sheets were soaked in sweat. He has never had pneumonia before. He denies dysphagia or GERD. He also denies headache, acute worsening of his chronic vision problems, diarrhea, or constipation. He has had urinary frequency but states this is a chronic issue for him.   Meds:  No current facility-administered medications on file prior to encounter.   Current Outpatient Medications on File Prior to Encounter  Medication Sig Dispense Refill    amLODipine-benazepril (LOTREL) 10-20 MG capsule TAKE 1 CAPSULE BY MOUTH DAILY 90 capsule 3   atenolol (TENORMIN) 50 MG tablet Take 1 tablet (50 mg total) by mouth daily. 90 tablet 3   isosorbide mononitrate (IMDUR) 60 MG 24 hr tablet Take 1 tablet by mouth once daily 30 tablet 5   nitroGLYCERIN (NITROSTAT) 0.4 MG SL tablet Place 1 tablet (0.4 mg total) under the tongue every 5 (five) minutes as needed (up to 3 doses). 25 tablet 3   pravastatin (PRAVACHOL) 40 MG tablet Take 1 tablet (40 mg total) by mouth daily. 30 tablet 1   rosuvastatin (CRESTOR) 20 MG tablet Take 1 tablet by mouth once daily 90 tablet 3   ticagrelor (BRILINTA) 60 MG TABS tablet Take 1 tablet (60 mg total) by mouth 2 (two) times daily. 180 tablet 3      Allergies: Allergies as of 07/16/2021 - Review Complete 07/16/2021  Allergen Reaction Noted   Keflex [cephalexin] Other (See Comments) 01/13/2012   Zocor [simvastatin] Other (See Comments) 01/13/2012   Lipitor [atorvastatin] Other (See Comments) 01/13/2012   Sulfa antibiotics Nausea Only 01/13/2012   Past Medical History:  Diagnosis Date   Asthma    CAD (coronary artery disease) of bypass graft 2003   (now known CTO of SVG-rPDA & SVG-D1 & 3 stents in SeqSVg-Om1-OM2 (1 in prox  leg, overlapping DES-BMS from OM1-OM2 into OM2 - s/p PTCA for ISR).; LIMA-LAD patent    CAD S/P PTCA-PCI of SeqSVG-OM1-OM2 2003; 01/2012; 05/2013   a) '03 SeqSVG-OM1-OM2,  Zeta BMS 3.0x8 Seq limb into OM2), & PTCA of SVg-D1 anastomosis; b) 2013: Distal Limb of SeqSVG-OM1-OM2 from OM1-2 overlap BMS ->Xience DES 2.75 x 15 - 21m;; c) 05/2013 Proxlimb SVG--OM1-OM2 -> Resolute DES 3 x 26 - 3.281m Angiosculpt PTCA of ISR in Seq Limb overlaping stents (3.25 mm)   CAD, multiple vessel - Native CAD. 1998; 2015   a) Referred for CABGx5 ; by Cath 05/2013: dLM 60%, Ost LAD 70&. Prox -mid LAD extensive 70%( to-fro flow); D1 80%; OM1 & OM2 (CTO), follow-on LCx beyond AVG 90+% with small OM3/OM4; RCA TO (R-R  collaterals);   Carotid bruit present 01/2012   a. 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 Also noted CTO of SVG-Diag; b) CRESCENDO ANGINA: 05/2013 - prox limb SVG-OM1-OM2 DES PCI & PTCA of 95% ISR in distal Limb overlapping DES-BMS; Myoview 08/25/13 - small non-T-mural Inf scar, No Ischemia   S/P CABG x 5 1998   LIMA-LAD, SeqSVG-OM1-OM2; SVG-rPDA, SVG-D1     Family History:  Family History  Problem Relation Age of Onset   Cancer Mother    Heart attack Father      Social History: Used to smoke for about 25-30 years, about 1 ppd. He quit smoking in 1992.  PCP: Dr. HaEllyn Hack(his cardiologist)  Review of Systems: A complete ROS was negative except as per HPI.   Physical Exam: Blood pressure 134/86, pulse 91, temperature (!) 100.5 F (38.1 C), temperature source Rectal, resp. rate (!) 23, height '5\' 11"'$  (1.803 m), weight 77.3 kg, SpO2 96 %.  Constitutional: Very hard of hearing, well-nourished, and in no distress.  HENT:  Head: Normocephalic and atraumatic.  Eyes: total vision loss in R eye, decreased vision loss in L eye  Neck: Normal range of motion.  Cardiovascular: Normal rate, regular rhythm, intact distal pulses. No gallop and no friction rub.  No murmur heard. No lower extremity edema  Pulmonary: Non labored breathing on 2L [place], Rales in RML no wheezing Chest: Sternal scar c/d/I well healed  Abdominal: Soft. Normal bowel sounds. Non distended and non tender Musculoskeletal: Normal range of motion.        General: No  tenderness or edema.  Neurological: Alert and oriented to person, place, and time. Non focal  Skin: Skin is warm and dry.    EKG: personally reviewed my interpretation is known RBBB (worked up with Cardiology 04/2021)  CXR: personally reviewed my interpretation is RUL/RML opacity   Formal read:  IMPRESSION: 1. Patchy right upper lobe airspace opacities suspicious for pneumonia. Followup PA and lateral chest X-ray is recommended in 3-4 weeks following appropriate therapy to ensure resolution and exclude underlying malignancy. 2. Postop CABG. Evidence of underlying chronic obstructive pulmonary disease.  TTE 06/12/2021  IMPRESSIONS    1. Left ventricular ejection fraction, by estimation, is 45  to 50%. The  left ventricle has mildly decreased function. The left ventricle  demonstrates regional wall motion abnormalities (see scoring  diagram/findings for description). The left ventricular   internal cavity size was mildly to moderately dilated. Left ventricular  diastolic parameters are consistent with Grade I diastolic dysfunction  (impaired relaxation). There is hypokinesis of the left ventricular,  entire inferior wall and inferolateral  wall.   2. Right ventricular systolic function is normal. The right ventricular  size is normal. There is normal pulmonary artery systolic pressure. The  estimated right ventricular systolic pressure is 62.1 mmHg.   3. Left atrial size was mild to moderately dilated.   4. The mitral valve is normal in structure. Mild mitral valve  regurgitation. No evidence of mitral stenosis.   5. Tricuspid valve regurgitation is mild to moderate.   6. The aortic valve is normal in structure. Aortic valve regurgitation is  not visualized. No aortic stenosis is present.   7. The inferior vena cava is dilated in size with >50% respiratory  variability, suggesting right atrial pressure of 8 mmHg.  Assessment & Plan by Problem: Principal Problem:    Pneumonia  #Community acquired pneumonia Patient presents with pleuritic pain and had a fever on presentation with a leukocytosis. He was found to have RML/RUL opacity concerning for pneumonia. Patient has no history of GERD or difficulty swallowing.   Given patient has reported allergy to keflex and it is difficult to gather the exact reaction to this medication given he is very hard of hearing. Patient was started on levaquin.  -Continue levaquin -Urine pneumococcal Ag/legionella  -IS, wean O2 as able  -Can likely transition to PO in the AM if patient able to be weaned from O2    #CAD s/p CABG and multiple PCI  Chest pain does not appear to be angina. Will continue patient's home imdur, benazepril-amlodipine, atenolol, brillinta  #HTN Continue medications per above   #HLD Continue home crestor   Dispo: Admit patient to Observation with expected length of stay less than 2 midnights.  Signed: Rick Duff, MD 07/16/2021, 7:28 PM  After 5pm on weekdays and 1pm on weekends: On Call pager: [phone]

## 2021-07-16 NOTE — ED Triage Notes (Signed)
Pt arrived via [facility] from home for midsternal chest pain that radiates to right side of chest.x1-2 weeks started when he was doing yard work and got worse the last two days. Pt has SOB. Pt has audible wheezes. Pt denies N/V. Initial bp was 180/130 then went to 157/99 after nitrox2. EMS gave ASA '324mg'$ . Pt is A&Ox4. Pt is sinus tach w/ST elevation and BBB on monitor. Pt is a little hypertensive.  ?

## 2021-07-17 ENCOUNTER — Observation Stay (HOSPITAL_COMMUNITY): Payer: Medicare Other

## 2021-07-17 DIAGNOSIS — J189 Pneumonia, unspecified organism: Secondary | ICD-10-CM

## 2021-07-17 DIAGNOSIS — I10 Essential (primary) hypertension: Secondary | ICD-10-CM | POA: Diagnosis not present

## 2021-07-17 LAB — BASIC METABOLIC PANEL
Anion gap: 9 (ref 5–15)
BUN: 16 mg/dL (ref 8–23)
CO2: 20 mmol/L — ABNORMAL LOW (ref 22–32)
Calcium: 8.6 mg/dL — ABNORMAL LOW (ref 8.9–10.3)
Chloride: 105 mmol/L (ref 98–111)
Creatinine, Ser: 1.02 mg/dL (ref 0.61–1.24)
GFR, Estimated: >60 mL/min (ref >60)
Glucose, Bld: 121 mg/dL — ABNORMAL HIGH (ref 70–99)
Potassium: 4.3 mmol/L (ref 3.5–5.1)
Sodium: 134 mmol/L — ABNORMAL LOW (ref 135–145)

## 2021-07-17 LAB — URINALYSIS, ROUTINE W REFLEX MICROSCOPIC
Bilirubin Urine: NEGATIVE
Glucose, UA: NEGATIVE mg/dL
Ketones, ur: 20 mg/dL — AB
Leukocytes,Ua: NEGATIVE
Nitrite: NEGATIVE
Protein, ur: 30 mg/dL — AB
RBC / HPF: >50 RBC/hpf — ABNORMAL HIGH (ref 0–5)
Specific Gravity, Urine: 1.021 (ref 1.005–1.030)
pH: 5 (ref 5.0–8.0)

## 2021-07-17 LAB — CBC
HCT: 38.5 % — ABNORMAL LOW (ref 39.0–52.0)
Hemoglobin: 12.8 g/dL — ABNORMAL LOW (ref 13.0–17.0)
MCH: 28.8 pg (ref 26.0–34.0)
MCHC: 33.2 g/dL (ref 30.0–36.0)
MCV: 86.5 fL (ref 80.0–100.0)
Platelets: 227 10*3/uL (ref 150–400)
RBC: 4.45 MIL/uL (ref 4.22–5.81)
RDW: 13.2 % (ref 11.5–15.5)
WBC: 9.8 10*3/uL (ref 4.0–10.5)
nRBC: 0 % (ref 0.0–0.2)

## 2021-07-17 LAB — STREP PNEUMONIAE URINARY ANTIGEN: Strep Pneumo Urinary Antigen: NEGATIVE

## 2021-07-17 IMAGING — DX DG ABDOMEN 1V
1 series · 1 of 1 positions shown · non-contrast
Comparison: None Available.

CLINICAL DATA: Constipation.

EXAM:
ABDOMEN - 1 VIEW

[abdomen]
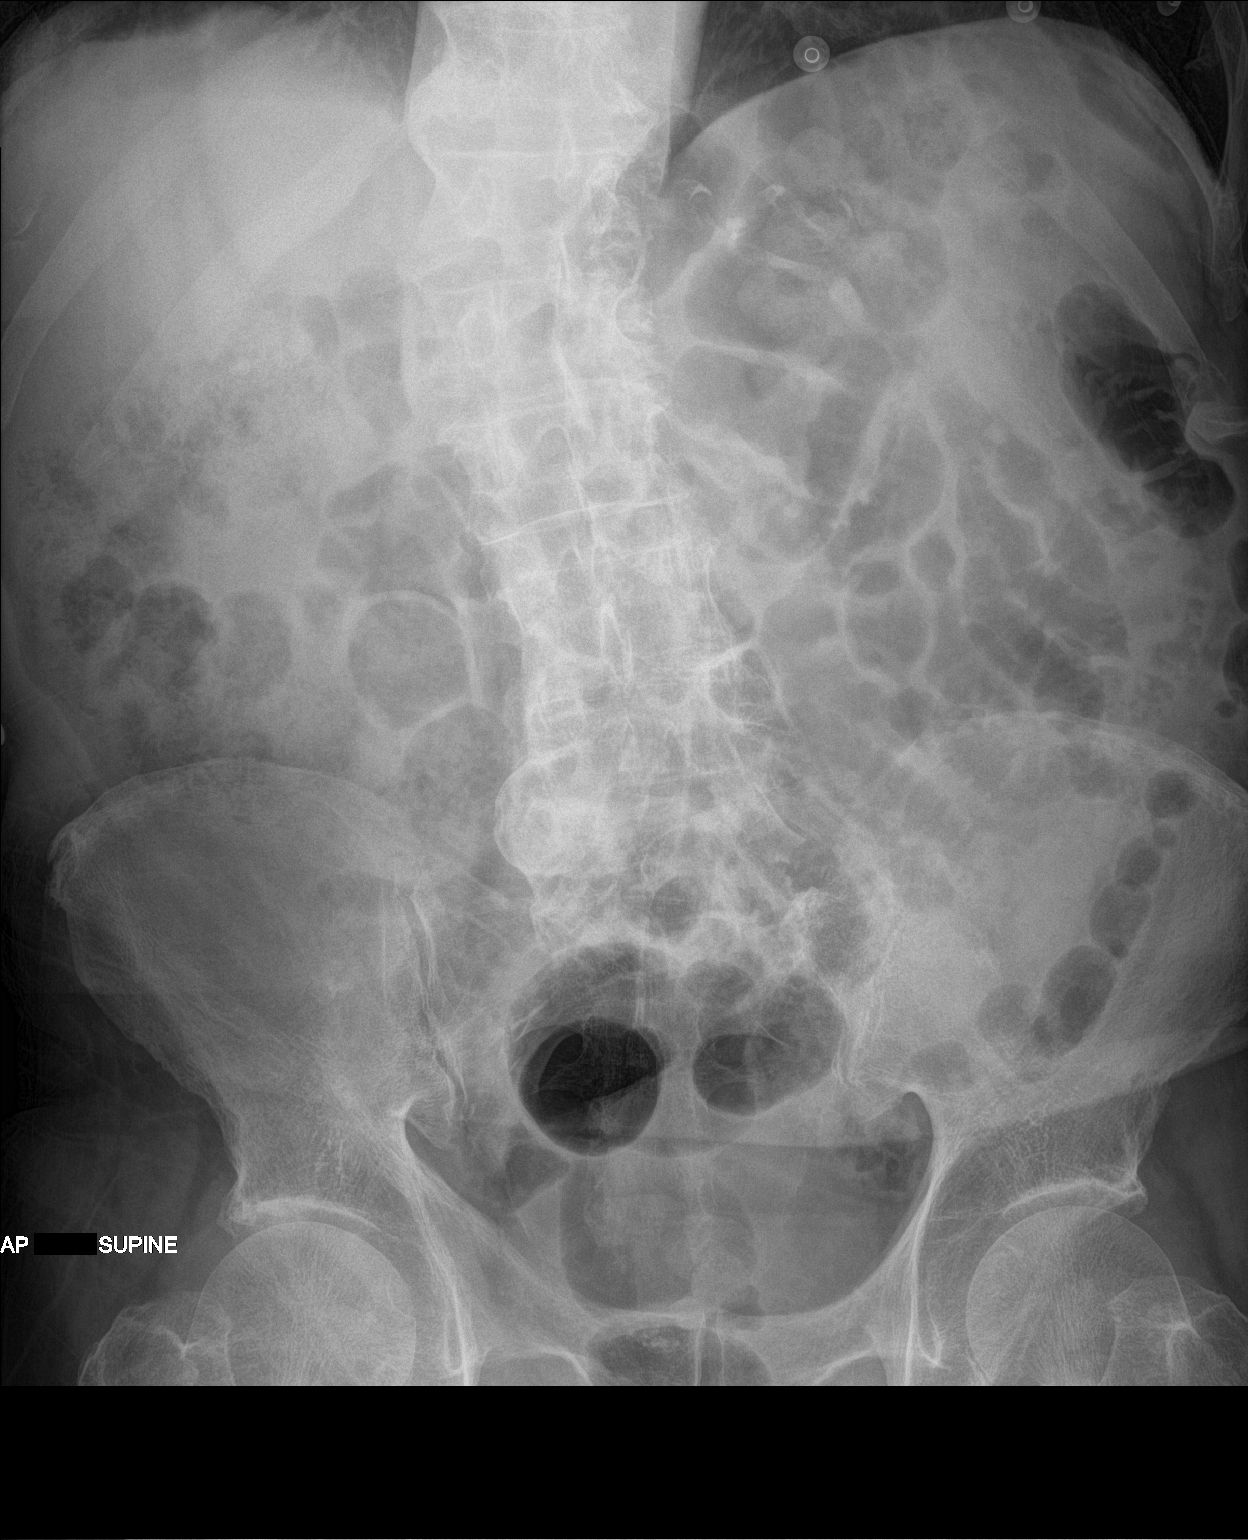

[1 of 1 positions shown; findings below may reference images not displayed]

FINDINGS: A small amount of stool is present primarily in the right colon. Gas
is present in nondilated loops of small and large bowel to the level
of the rectum without evidence of obstruction. Upper lumbar
dextroscoliosis is noted.
IMPRESSION: Nonobstructed bowel gas pattern.  Small colonic stool burden.

## 2021-07-17 MED ORDER — AMOXICILLIN-POT CLAVULANATE 500-125 MG PO TABS
1.0000 | ORAL_TABLET | Freq: Three times a day (TID) | ORAL | Status: DC
  Administered 2021-07-17: 500 mg via ORAL
  Filled 2021-07-17 (×2): qty 1

## 2021-07-17 MED ORDER — DOXYCYCLINE HYCLATE 100 MG PO TABS: 100.0000 mg | ORAL_TABLET | Freq: Two times a day (BID) | ORAL | 0 refills | Status: DC

## 2021-07-17 MED ORDER — AMOXICILLIN-POT CLAVULANATE 500-125 MG PO TABS: 1.0000 | ORAL_TABLET | Freq: Three times a day (TID) | ORAL | 0 refills | Status: DC

## 2021-07-17 MED ORDER — DOXYCYCLINE HYCLATE 100 MG PO TABS
100.0000 mg | ORAL_TABLET | Freq: Two times a day (BID) | ORAL | Status: DC
  Administered 2021-07-17: 100 mg via ORAL
  Filled 2021-07-17: qty 1

## 2021-07-17 NOTE — Plan of Care (Signed)
  Problem: Education: Goal: Knowledge of General Education information will improve Description: Including pain rating scale, medication(s)/side effects and non-pharmacologic comfort measures Outcome: Adequate for Discharge   Problem: Activity: Goal: Ability to tolerate increased activity will improve Outcome: Adequate for Discharge   Problem: Clinical Measurements: Goal: Ability to maintain a body temperature in the normal range will improve Outcome: Adequate for Discharge   Problem: Respiratory: Goal: Ability to maintain adequate ventilation will improve Outcome: Adequate for Discharge Goal: Ability to maintain a clear airway will improve Outcome: Adequate for Discharge

## 2021-07-17 NOTE — Discharge Instructions (Signed)
You were seen in the hospital for pneumonia.  On day of your discharge, you are breathing well on room air, and you responded well to antibiotics that we gave you here.  You will take a medication called Augmentin for the next week, and you will take a medication called doxycycline for the next week.  Please take these as prescribed.  I have reached out to our front desk to arrange a hospital follow-up appointment for next week, please attend this appointment and discuss your recent hospitalization here. If any symptoms change or worsen acutely, please return to the nearest emergency department.

## 2021-07-17 NOTE — Progress Notes (Signed)
   Subjective:  Patient seen and assessed at bedside. Reports having some pleuritic chest pain still but is improving. Turned oxygen off in the room and continued to sat at 100%. Discussed having a allergy to sulfa medications which caused him to vomit but does not know of having anaphylactic reactions to any antibiotics.  No other complaints or concerns today.  Objective:  Vital signs in last 24 hours: Vitals:   07/16/21 1900 07/16/21 2041 07/17/21 0512 07/17/21 0718  BP: 126/69 128/62 131/62 134/65  Pulse: 79 95 93 88  Resp: '20 18  16  '$ Temp:  97.9 F (36.6 C) 99.1 F (37.3 C) 98.9 F (37.2 C)  TempSrc:  Oral Oral Oral  SpO2: 96% 95% 99% 97%  Weight:      Height:       General: NAD, nl appearance, breathing comfortably on room air HE: Normocephalic, atraumatic, EOMI, Conjunctivae normal ENT: No congestion, no rhinorrhea, no exudate or erythema  Cardiovascular: Normal rate, regular rhythm. No murmurs, rubs, or gallops Pulmonary: Effort normal, no rales appreciated, mild end expiratory wheezing in lower lung fields Abdominal: soft, nontender, bowel sounds present Musculoskeletal: no swelling, deformity, injury or tenderness in extremities Skin: Warm, dry, no bruising, erythema, or rash Psychiatric/Behavioral: normal mood, normal behavior     Assessment/Plan:  Principal Problem:   Pneumonia  #Community acquired pneumonia Patient presents with pleuritic pain and had a fever on presentation with a leukocytosis, found to have RML/RUL opacity concerning for pneumonia.  He has been improving clinically on Levaquin, and he is currently afebrile and hemodynamically stable.  We turned off his oxygen in the room, and the patient was satting in the 100s and endorsed no difficulty breathing.  He stated to me multiple times that he does not recall having an anaphylactic reaction to any antibiotics (Keflex is listed as an unspecified allergy in his chart).  At this time, will transition to p.o.  Augmentin and doxycycline and assess his response.  If he is able to tolerate Augmentin, anticipate that he can be discharged later today. -Discontinue IV Levaquin today -Start Augmentin 500-125 mg 3 times daily -Start doxycycline 100 mg twice daily -Urine pneumococcal Ag/legionella pending  #CAD s/p CABG and multiple PCI  Chest pain does not appear to be angina.  Cardiology agrees and has signed off.  Will continue patient's home imdur, benazepril-amlodipine, atenolol, brillinta.   #HTN Continue medications as above    #HLD Continue home crestor   Prior to Admission Living Arrangement: Home, by himself Anticipated Discharge Location: Home Barriers to Discharge: Medical stability Dispo: Anticipated discharge in approximately 0-1 day(s).   Orvis Brill, MD 07/17/2021, 7:39 AM Pager: [phone]  After 5pm on weekdays and 1pm on weekends: On Call pager [phone]

## 2021-07-17 NOTE — Progress Notes (Signed)
[facility] will sign off.  Troponins 28>34>34. No indication of ACS. No further cardiac work up needed Medication Recommendations:  continue prior cardiac care Other recommendations (labs, testing, etc):  none Follow up as an outpatient:  with Dr Ellyn Hack as per routine   Amador Braddy Martinique MD, [facility]

## 2021-07-17 NOTE — Progress Notes (Signed)
Patient's son given dc instructions at this time.  Patient removed IVs himself.  No bleeding noted at sites.   Son verbalizes understanding of dc instructions and follow up appointemnts.

## 2021-07-17 NOTE — Care Management Obs Status (Signed)
[place] NOTIFICATION   Patient Details  Name: Xavier Giles MRN: [id number] Date of Birth: 03-May-1940   Medicare Observation Status Notification Given:  Yes  Patient wants daughter to be notified cannot sign at this time [facility]) telephone explanation  of notice and permission to sign given by daughter Mrs. Gean Birchwood, RN 07/17/2021, 12:16 PM

## 2021-07-17 NOTE — Discharge Summary (Signed)
Name: Xavier Giles MRN: [id number] DOB: [birth year] 81 y.o. PCP: [name], MD  Date of Admission: 07/16/2021  3:07 PM Date of Discharge: 07/17/2021 Attending Physician: No att. providers found  Discharge Diagnosis: 1.  Many acquired pneumonia 2.  CAD status post CABG and multiple PCI 3.  Hypertension 4.  Hyperlipidemia 5.  Microscopic hematuria 6. Lower lobe pulmonary nodules on CT imaging from 2015  Discharge Medications: Allergies as of 07/17/2021       Reactions   Keflex [cephalexin] Other (See Comments)   Unknown ;   Patient states he is not aware of being allergic to cephalexin per [facility] resident MD report.     Zocor [simvastatin] Other (See Comments)   Unknown    Lipitor [atorvastatin] Other (See Comments)   Elevated blood sugar   Sulfa Antibiotics Nausea Only        Medication List     STOP taking these medications    pravastatin 40 MG tablet Commonly known as: PRAVACHOL       TAKE these medications    amLODipine-benazepril 10-20 MG capsule Commonly known as: LOTREL TAKE 1 CAPSULE BY MOUTH DAILY   amoxicillin-clavulanate 500-125 MG tablet Commonly known as: AUGMENTIN Take 1 tablet (500 mg total) by mouth 3 (three) times daily.   atenolol 50 MG tablet Commonly known as: TENORMIN Take 1 tablet (50 mg total) by mouth daily.   doxycycline 100 MG tablet Commonly known as: VIBRA-TABS Take 1 tablet (100 mg total) by mouth every 12 (twelve) hours.   isosorbide mononitrate 60 MG 24 hr tablet Commonly known as: IMDUR Take 1 tablet by mouth once daily   nitroGLYCERIN 0.4 MG SL tablet Commonly known as: NITROSTAT Place 1 tablet (0.4 mg total) under the tongue every 5 (five) minutes as needed (up to 3 doses).   rosuvastatin 20 MG tablet Commonly known as: CRESTOR Take 1 tablet by mouth once daily   ticagrelor 60 MG Tabs tablet Commonly known as: BRILINTA Take 1 tablet (60 mg total) by mouth 2 (two) times daily.        Disposition and  follow-up:   Mr.Xavier Giles was discharged from [facility] in Good condition.  At the hospital follow up visit please address:  1.  Community acquired pneumonia: Patient was discharged on 1 week of Augmentin 500-125 mg 3 times daily and 1 week of doxycycline 100 mg twice daily.  2.  Labs / imaging needed at time of follow-up: Consider obtaining repeat UA and CT chest (see below)  3.  Pending labs/ test needing follow-up: Legionella  Follow-up Appointments:  Follow-up [facility] Follow up in 1 week(s).   Why: Office will call you for follow up appointment. Contact information: [place] by problem list: #Community acquired pneumonia Patient presented with pleuritic pain and had a fever on presentation with a leukocytosis. He also had a new oxygen requirement of 2 L initially. He was found to have RML/RUL opacity concerning for pneumonia.  On the first day of his hospitalization, he was started on Levaquin due to concern for possible allergy to Keflex.  The following day, the patient was afebrile and hemodynamically stable. We turned off his oxygen in the room, and the patient was satting in the 100s and endorsed no difficulty breathing.  He stated to our team multiple times that he does not  recall having an anaphylactic reaction to any antibiotics (Keflex is listed as an unspecified allergy in his chart).  Therefore, he was transitioned to p.o. Augmentin and doxycycline regimens and was observed in the hospital for any potential anaphylactic reactions.  He tolerated this well, and he was subsequently discharged on Augmentin 500-125 mg 3 times daily for 1 week and doxycycline 100 mg twice daily for 1 week.  #CAD s/p CABG and multiple PCI  Patient presented here with pleuritic chest pain, however lower suspicion for ACS as a cause.  Troponins 28   -> 34.  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  Cardiology signed up the following day due to low concern for ACS.  Otherwise, he was continued on his home imdur, benazepril-amlodipine, and atenolol, and Brilinta.  #HTN Home medications were continued.   #HLD Continued home crestor.  #Microscopic hematuria Urinalysis revealed moderate hemoglobin and RBCs > 50.  Negative nitrites and leukocytes. Patient has been asymptomatic, kidney function was trended and stable during this hospitalization.  Consider obtaining repeat UA at next PCP visit in the setting of his prior smoking history.  #Lower lobe pulmonary nodules on CT imaging from 2015 CT chest without contrast was reviewed from 2015, showing small 5-6 mm pulmonary nodules in the lower lobes. Could consider repeat CT chest as pt was recommended in 2015 to have nodules monitored though it appears that a repeat scan was not obtained.  Discharge Exam:   BP 127/65 (BP Location: Right Arm)   Pulse 89   Temp 97.6 F (36.4 C) (Oral)   Resp 15   Ht '5\' 11"'$  (1.803 m)   Wt 77.3 kg   SpO2 99%   BMI 23.77 kg/m  Discharge exam:  General: NAD, nl appearance, breathing comfortably on room air HE: Normocephalic, atraumatic, EOMI, Conjunctivae normal ENT: No congestion, no rhinorrhea, no exudate or erythema  Cardiovascular: Normal rate, regular rhythm. No murmurs, rubs, or gallops Pulmonary: Effort normal, no rales appreciated, mild end expiratory wheezing in lower lung fields Abdominal: soft, nontender, bowel sounds present Musculoskeletal: no swelling, deformity, injury or tenderness in extremities Skin: Warm, dry, no bruising, erythema, or rash Psychiatric/Behavioral: normal mood, normal behavior     Pertinent Labs, Studies, and Procedures:  DG Abd 1 View  Result Date: 07/17/2021 CLINICAL DATA:  Constipation. EXAM: ABDOMEN - 1 VIEW COMPARISON:  None Available. FINDINGS: A small amount of stool is present primarily in the  right colon. Gas is present in nondilated loops of small and large bowel to the level of the rectum without evidence of obstruction. Upper lumbar dextroscoliosis is noted. IMPRESSION: Nonobstructed bowel gas pattern.  Small colonic stool burden. Electronically Signed   By: Logan Bores M.D.   On: 07/17/2021 08:22   DG Chest Port 1 View  Result Date: 07/16/2021 CLINICAL DATA:  Midsternal chest pain radiating to the right side for 1-2 weeks. Shortness of breath. EXAM: PORTABLE CHEST 1 VIEW COMPARISON:  Radiographs and CT 06/28/2013. FINDINGS: 1528 hours. Two views were obtained. Lordotic positioning. The heart size and mediastinal contours are stable status post median sternotomy and CABG. The lungs are hyperinflated. There are new patchy airspace opacities within the right upper lobe, suspicious for pneumonia. The left lung appears clear. There is no pleural effusion or pneumothorax. No acute osseous findings are evident. There are mild degenerative changes in the spine. Telemetry leads overlie the chest. IMPRESSION: 1. Patchy right upper lobe airspace  opacities suspicious for pneumonia. Followup PA and lateral chest X-ray is recommended in 3-4 weeks following appropriate therapy to ensure resolution and exclude underlying malignancy. 2. Postop CABG. Evidence of underlying chronic obstructive pulmonary disease. Electronically Signed   By: Richardean Sale M.D.   On: 07/16/2021 15:40       Latest Ref Rng & Units 07/17/2021    1:56 AM 07/16/2021    3:33 PM 07/16/2021    3:12 PM  CBC  WBC 4.0 - 10.5 K/uL 9.8    11.9    Hemoglobin 13.0 - 17.0 g/dL 12.8   13.9   14.6    Hematocrit 39.0 - 52.0 % 38.5   41.0   44.0    Platelets 150 - 400 K/uL 227    255        Latest Ref Rng & Units 07/17/2021    1:56 AM 07/16/2021    3:33 PM 07/16/2021    3:12 PM  BMP  Glucose 70 - 99 mg/dL 121   148   150    BUN 8 - 23 mg/dL '16   18   18    '$ Creatinine 0.61 - 1.24 mg/dL 1.02   1.00   1.16    Sodium 135 - 145 mmol/L 134    136   138    Potassium 3.5 - 5.1 mmol/L 4.3   3.9   4.5    Chloride 98 - 111 mmol/L 105   104   107    CO2 22 - 32 mmol/L 20    23    Calcium 8.9 - 10.3 mg/dL 8.6    9.1      Discharge Instructions: Discharge Instructions     Call MD for:  difficulty breathing, headache or visual disturbances   Complete by: As directed    Call MD for:  persistant dizziness or light-headedness   Complete by: As directed    Call MD for:  temperature >100.4   Complete by: As directed        Signed: Orvis Brill, MD 07/18/2021, 11:57 AM   Pager: [phone]

## 2021-07-17 NOTE — Progress Notes (Signed)
Mobility Specialist Progress Note    07/17/21 1211  Mobility  Activity Transferred from bed to chair  Level of Assistance Contact guard assist, steadying assist  Assistive Device None  Distance Ambulated (ft) 3 ft  Activity Response Tolerated fair  $Mobility charge 1 Mobility   Pt received in bed and agreeable. Stated he couldn't catch his breath to hold conversation so deferred further ambulation. Left with call bell in reach.   Hildred Alamin Mobility Specialist  Primary: 5N M.S. Phone: [phone] Secondary: 6N M.S. Phone: [phone]

## 2021-07-18 LAB — LEGIONELLA PNEUMOPHILA SEROGP 1 UR AG: L. pneumophila Serogp 1 Ur Ag: NEGATIVE

## 2021-07-21 LAB — CULTURE, BLOOD (ROUTINE X 2)
Culture: NO GROWTH
Culture: NO GROWTH
Special Requests: ADEQUATE
Special Requests: ADEQUATE

## 2021-07-22 ENCOUNTER — Inpatient Hospital Stay (HOSPITAL_COMMUNITY)
Admission: EM | Admit: 2021-07-22 | Discharge: 2021-07-26 | DRG: 871 | Disposition: A | Discharge status: Home Health | Payer: Medicare Other | Attending: Infectious Diseases | Admitting: Infectious Diseases

## 2021-07-22 ENCOUNTER — Emergency Department (HOSPITAL_COMMUNITY): Payer: Medicare Other

## 2021-07-22 ENCOUNTER — Inpatient Hospital Stay (HOSPITAL_COMMUNITY): Payer: Medicare Other

## 2021-07-22 ENCOUNTER — Encounter (HOSPITAL_COMMUNITY): Payer: Self-pay

## 2021-07-22 DIAGNOSIS — J9601 Acute respiratory failure with hypoxia: Secondary | ICD-10-CM | POA: Diagnosis present

## 2021-07-22 DIAGNOSIS — H5461 Unqualified visual loss, right eye, normal vision left eye: Secondary | ICD-10-CM | POA: Diagnosis present

## 2021-07-22 DIAGNOSIS — Z888 Allergy status to other drugs, medicaments and biological substances status: Secondary | ICD-10-CM

## 2021-07-22 DIAGNOSIS — I25810 Atherosclerosis of coronary artery bypass graft(s) without angina pectoris: Secondary | ICD-10-CM | POA: Diagnosis present

## 2021-07-22 DIAGNOSIS — E1165 Type 2 diabetes mellitus with hyperglycemia: Secondary | ICD-10-CM | POA: Diagnosis present

## 2021-07-22 DIAGNOSIS — I639 Cerebral infarction, unspecified: Secondary | ICD-10-CM | POA: Diagnosis present

## 2021-07-22 DIAGNOSIS — H9193 Unspecified hearing loss, bilateral: Secondary | ICD-10-CM | POA: Diagnosis present

## 2021-07-22 DIAGNOSIS — J189 Pneumonia, unspecified organism: Secondary | ICD-10-CM

## 2021-07-22 DIAGNOSIS — Z79899 Other long term (current) drug therapy: Secondary | ICD-10-CM

## 2021-07-22 DIAGNOSIS — G9340 Encephalopathy, unspecified: Secondary | ICD-10-CM | POA: Diagnosis not present

## 2021-07-22 DIAGNOSIS — G9341 Metabolic encephalopathy: Secondary | ICD-10-CM | POA: Diagnosis present

## 2021-07-22 DIAGNOSIS — K5730 Diverticulosis of large intestine without perforation or abscess without bleeding: Secondary | ICD-10-CM | POA: Diagnosis present

## 2021-07-22 DIAGNOSIS — Y95 Nosocomial condition: Secondary | ICD-10-CM | POA: Diagnosis present

## 2021-07-22 DIAGNOSIS — A419 Sepsis, unspecified organism: Secondary | ICD-10-CM | POA: Diagnosis present

## 2021-07-22 DIAGNOSIS — Z20822 Contact with and (suspected) exposure to covid-19: Secondary | ICD-10-CM | POA: Diagnosis present

## 2021-07-22 DIAGNOSIS — Z7902 Long term (current) use of antithrombotics/antiplatelets: Secondary | ICD-10-CM

## 2021-07-22 DIAGNOSIS — R6520 Severe sepsis without septic shock: Secondary | ICD-10-CM | POA: Diagnosis present

## 2021-07-22 DIAGNOSIS — Z961 Presence of intraocular lens: Secondary | ICD-10-CM | POA: Diagnosis present

## 2021-07-22 DIAGNOSIS — I82402 Acute embolism and thrombosis of unspecified deep veins of left lower extremity: Secondary | ICD-10-CM | POA: Diagnosis present

## 2021-07-22 DIAGNOSIS — R471 Dysarthria and anarthria: Secondary | ICD-10-CM | POA: Diagnosis present

## 2021-07-22 DIAGNOSIS — I10 Essential (primary) hypertension: Secondary | ICD-10-CM | POA: Diagnosis present

## 2021-07-22 DIAGNOSIS — E785 Hyperlipidemia, unspecified: Secondary | ICD-10-CM | POA: Diagnosis present

## 2021-07-22 DIAGNOSIS — Z8249 Family history of ischemic heart disease and other diseases of the circulatory system: Secondary | ICD-10-CM

## 2021-07-22 DIAGNOSIS — R778 Other specified abnormalities of plasma proteins: Secondary | ICD-10-CM

## 2021-07-22 DIAGNOSIS — Z882 Allergy status to sulfonamides status: Secondary | ICD-10-CM | POA: Diagnosis not present

## 2021-07-22 DIAGNOSIS — R4182 Altered mental status, unspecified: Principal | ICD-10-CM

## 2021-07-22 DIAGNOSIS — I340 Nonrheumatic mitral (valve) insufficiency: Secondary | ICD-10-CM | POA: Diagnosis not present

## 2021-07-22 DIAGNOSIS — I248 Other forms of acute ischemic heart disease: Secondary | ICD-10-CM | POA: Diagnosis present

## 2021-07-22 DIAGNOSIS — R824 Acetonuria: Secondary | ICD-10-CM | POA: Diagnosis present

## 2021-07-22 DIAGNOSIS — Q2112 Patent foramen ovale: Secondary | ICD-10-CM | POA: Diagnosis not present

## 2021-07-22 DIAGNOSIS — N400 Enlarged prostate without lower urinary tract symptoms: Secondary | ICD-10-CM | POA: Diagnosis present

## 2021-07-22 DIAGNOSIS — R809 Proteinuria, unspecified: Secondary | ICD-10-CM | POA: Diagnosis present

## 2021-07-22 DIAGNOSIS — I252 Old myocardial infarction: Secondary | ICD-10-CM

## 2021-07-22 DIAGNOSIS — H5462 Unqualified visual loss, left eye, normal vision right eye: Secondary | ICD-10-CM | POA: Diagnosis present

## 2021-07-22 DIAGNOSIS — I6340 Cerebral infarction due to embolism of unspecified cerebral artery: Secondary | ICD-10-CM | POA: Insufficient documentation

## 2021-07-22 DIAGNOSIS — K8020 Calculus of gallbladder without cholecystitis without obstruction: Secondary | ICD-10-CM | POA: Diagnosis present

## 2021-07-22 DIAGNOSIS — Z9842 Cataract extraction status, left eye: Secondary | ICD-10-CM

## 2021-07-22 DIAGNOSIS — I6310 Cerebral infarction due to embolism of unspecified precerebral artery: Secondary | ICD-10-CM | POA: Diagnosis not present

## 2021-07-22 DIAGNOSIS — Z87891 Personal history of nicotine dependence: Secondary | ICD-10-CM

## 2021-07-22 DIAGNOSIS — I6389 Other cerebral infarction: Secondary | ICD-10-CM | POA: Diagnosis not present

## 2021-07-22 DIAGNOSIS — R7989 Other specified abnormal findings of blood chemistry: Secondary | ICD-10-CM

## 2021-07-22 DIAGNOSIS — R29713 NIHSS score 13: Secondary | ICD-10-CM | POA: Diagnosis present

## 2021-07-22 DIAGNOSIS — I2699 Other pulmonary embolism without acute cor pulmonale: Secondary | ICD-10-CM | POA: Diagnosis present

## 2021-07-22 DIAGNOSIS — R791 Abnormal coagulation profile: Secondary | ICD-10-CM | POA: Diagnosis present

## 2021-07-22 DIAGNOSIS — I6521 Occlusion and stenosis of right carotid artery: Secondary | ICD-10-CM | POA: Diagnosis present

## 2021-07-22 DIAGNOSIS — I25119 Atherosclerotic heart disease of native coronary artery with unspecified angina pectoris: Secondary | ICD-10-CM | POA: Diagnosis not present

## 2021-07-22 DIAGNOSIS — Z9841 Cataract extraction status, right eye: Secondary | ICD-10-CM

## 2021-07-22 LAB — CBC WITH DIFFERENTIAL/PLATELET
Abs Immature Granulocytes: 0.08 10*3/uL — ABNORMAL HIGH (ref 0.00–0.07)
Basophils Absolute: 0.1 10*3/uL (ref 0.0–0.1)
Basophils Relative: 1 %
Eosinophils Absolute: 0 10*3/uL (ref 0.0–0.5)
Eosinophils Relative: 0 %
HCT: 40.9 % (ref 39.0–52.0)
Hemoglobin: 13.5 g/dL (ref 13.0–17.0)
Immature Granulocytes: 1 %
Lymphocytes Relative: 7 %
Lymphs Abs: 0.7 10*3/uL (ref 0.7–4.0)
MCH: 28.2 pg (ref 26.0–34.0)
MCHC: 33 g/dL (ref 30.0–36.0)
MCV: 85.6 fL (ref 80.0–100.0)
Monocytes Absolute: 1.1 10*3/uL — ABNORMAL HIGH (ref 0.1–1.0)
Monocytes Relative: 11 %
Neutro Abs: 8 10*3/uL — ABNORMAL HIGH (ref 1.7–7.7)
Neutrophils Relative %: 80 %
Platelets: 308 10*3/uL (ref 150–400)
RBC: 4.78 MIL/uL (ref 4.22–5.81)
RDW: 13.6 % (ref 11.5–15.5)
WBC: 10 10*3/uL (ref 4.0–10.5)
nRBC: 0 % (ref 0.0–0.2)

## 2021-07-22 LAB — I-STAT VENOUS BLOOD GAS, ED
Acid-Base Excess: 1 mmol/L (ref 0.0–2.0)
Bicarbonate: 25.7 mmol/L (ref 20.0–28.0)
Calcium, Ion: 1.11 mmol/L — ABNORMAL LOW (ref 1.15–1.40)
HCT: 41 % (ref 39.0–52.0)
Hemoglobin: 13.9 g/dL (ref 13.0–17.0)
O2 Saturation: 88 %
Potassium: 4.3 mmol/L (ref 3.5–5.1)
Sodium: 136 mmol/L (ref 135–145)
TCO2: 27 mmol/L (ref 22–32)
pCO2, Ven: 41.9 mmHg — ABNORMAL LOW (ref 44–60)
pH, Ven: 7.396 (ref 7.25–7.43)
pO2, Ven: 55 mmHg — ABNORMAL HIGH (ref 32–45)

## 2021-07-22 LAB — COMPREHENSIVE METABOLIC PANEL
ALT: 146 U/L — ABNORMAL HIGH (ref 0–44)
AST: 97 U/L — ABNORMAL HIGH (ref 15–41)
Albumin: 2.4 g/dL — ABNORMAL LOW (ref 3.5–5.0)
Alkaline Phosphatase: 96 U/L (ref 38–126)
Anion gap: 9 (ref 5–15)
BUN: 21 mg/dL (ref 8–23)
CO2: 23 mmol/L (ref 22–32)
Calcium: 8.2 mg/dL — ABNORMAL LOW (ref 8.9–10.3)
Chloride: 103 mmol/L (ref 98–111)
Creatinine, Ser: 1.15 mg/dL (ref 0.61–1.24)
GFR, Estimated: >60 mL/min (ref >60)
Glucose, Bld: 232 mg/dL — ABNORMAL HIGH (ref 70–99)
Potassium: 4.4 mmol/L (ref 3.5–5.1)
Sodium: 135 mmol/L (ref 135–145)
Total Bilirubin: 0.9 mg/dL (ref 0.3–1.2)
Total Protein: 5.2 g/dL — ABNORMAL LOW (ref 6.5–8.1)

## 2021-07-22 LAB — RESP PANEL BY RT-PCR (FLU A&B, COVID) ARPGX2
Influenza A by PCR: NEGATIVE
Influenza B by PCR: NEGATIVE
SARS Coronavirus 2 by RT PCR: NEGATIVE

## 2021-07-22 LAB — CBG MONITORING, ED: Glucose-Capillary: 204 mg/dL — ABNORMAL HIGH (ref 70–99)

## 2021-07-22 LAB — URINALYSIS, ROUTINE W REFLEX MICROSCOPIC
Bilirubin Urine: NEGATIVE
Glucose, UA: NEGATIVE mg/dL
Hgb urine dipstick: NEGATIVE
Ketones, ur: 5 mg/dL — AB
Leukocytes,Ua: NEGATIVE
Nitrite: NEGATIVE
Protein, ur: 100 mg/dL — AB
Specific Gravity, Urine: 1.021 (ref 1.005–1.030)
pH: 7 (ref 5.0–8.0)

## 2021-07-22 LAB — MRSA NEXT GEN BY PCR, NASAL: MRSA by PCR Next Gen: NOT DETECTED

## 2021-07-22 LAB — RAPID URINE DRUG SCREEN, HOSP PERFORMED
Amphetamines: NOT DETECTED
Barbiturates: NOT DETECTED
Benzodiazepines: NOT DETECTED
Cocaine: NOT DETECTED
Opiates: NOT DETECTED
Tetrahydrocannabinol: NOT DETECTED

## 2021-07-22 LAB — TROPONIN I (HIGH SENSITIVITY)
Troponin I (High Sensitivity): 143 ng/L (ref <18)
Troponin I (High Sensitivity): 151 ng/L (ref <18)

## 2021-07-22 LAB — ETHANOL: Alcohol, Ethyl (B): <10 mg/dL (ref <10)

## 2021-07-22 LAB — PROTIME-INR
INR: 1.3 — ABNORMAL HIGH (ref 0.8–1.2)
Prothrombin Time: 16.2 seconds — ABNORMAL HIGH (ref 11.4–15.2)

## 2021-07-22 LAB — LACTIC ACID, PLASMA: Lactic Acid, Venous: 1.6 mmol/L (ref 0.5–1.9)

## 2021-07-22 LAB — AMMONIA: Ammonia: 15 umol/L (ref 9–35)

## 2021-07-22 IMAGING — CT CT ANGIO CHEST-ABD-PELV FOR DISSECTION W/ AND WO/W CM
2 of 7 series · 13 of 46 positions shown, 15 images · non-contrast
Comparison: [DATE]

CLINICAL DATA: Altered mental status.

EXAM:
CT ANGIOGRAPHY CHEST, ABDOMEN AND PELVIS
TECHNIQUE: Non-contrast CT of the chest was initially obtained.

[Series 7: arterial · axial · arterial · 0.75mm/px · z∈[+794,+1394]mm · 10 of 338 slices shown, 12 images]
[im 19/338  soft-tissue]
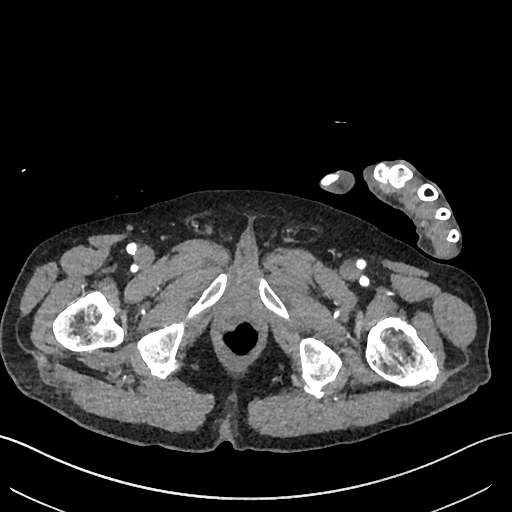
[im 19/338  bone]
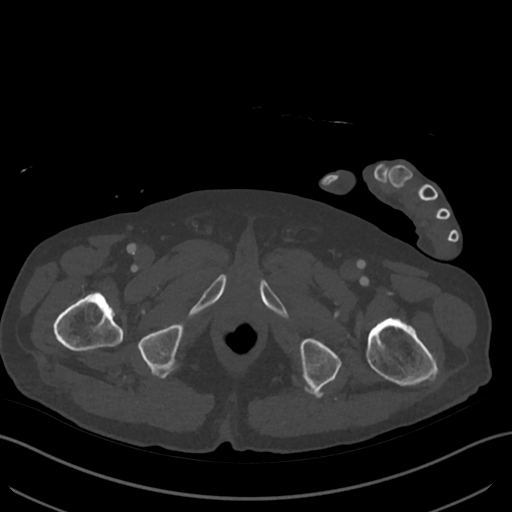
[im 57/338  soft-tissue]
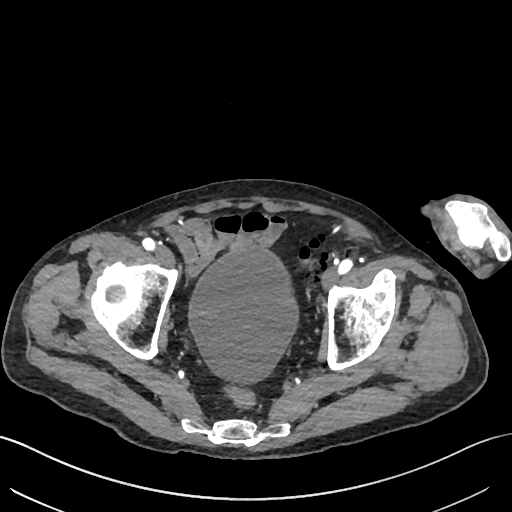
[im 94/338  soft-tissue]
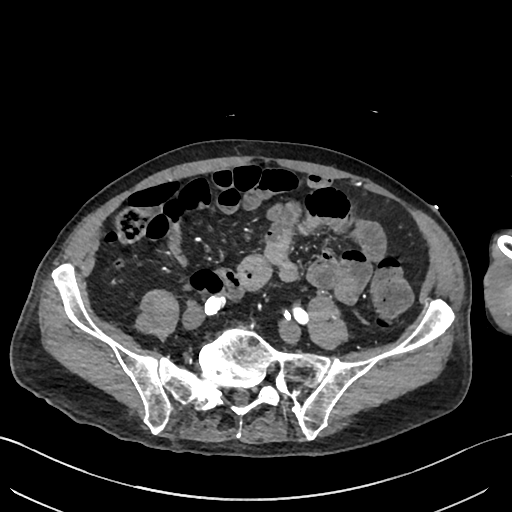
[im 113/338  soft-tissue]
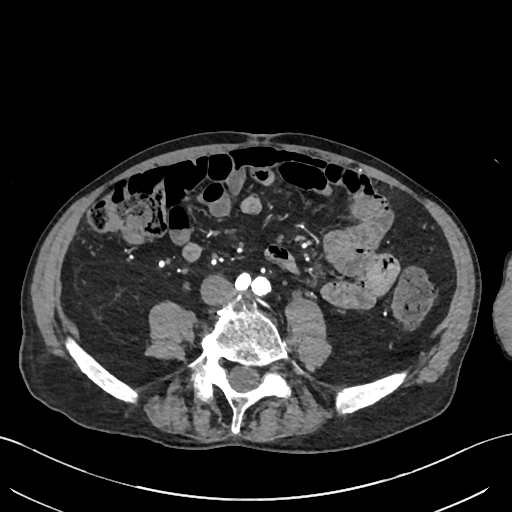
[im 150/338  soft-tissue]
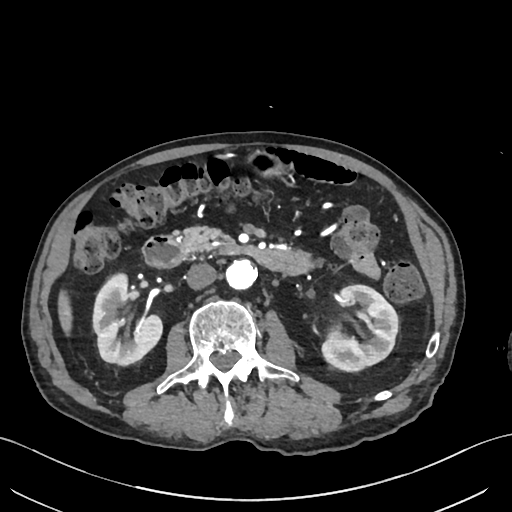
[im 188/338  soft-tissue]
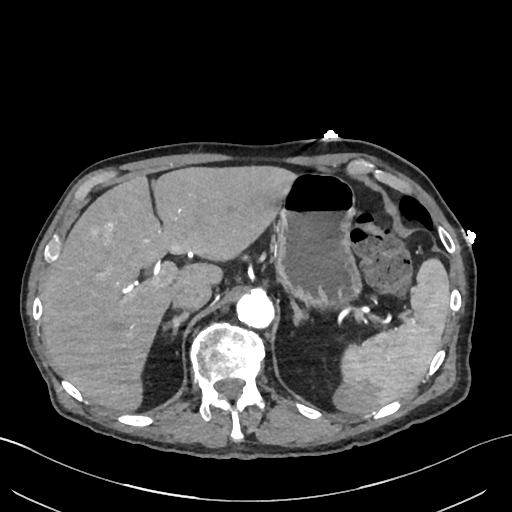
[im 225/338  soft-tissue]
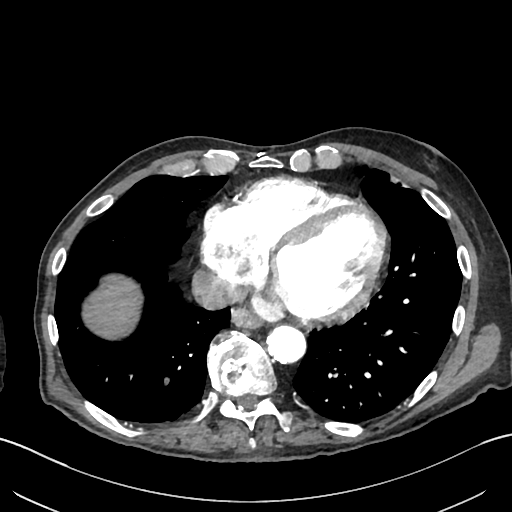
[im 244/338  soft-tissue]
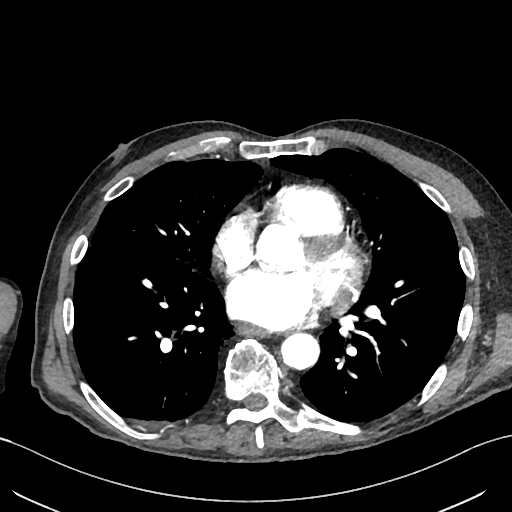
[im 281/338  soft-tissue]
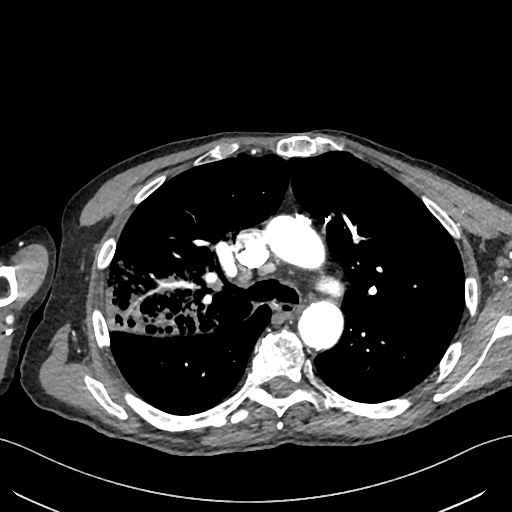
[im 281/338  bone]
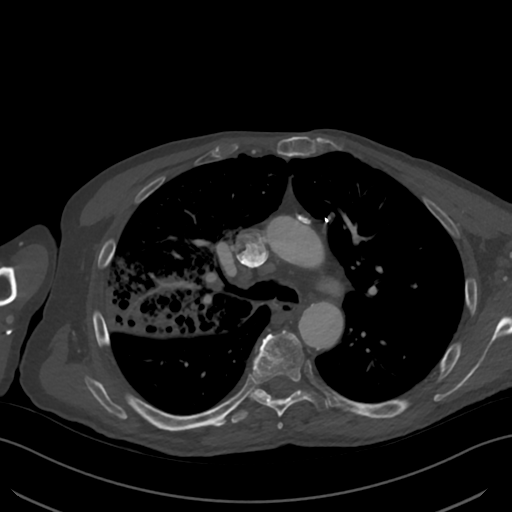
[im 319/338  soft-tissue]
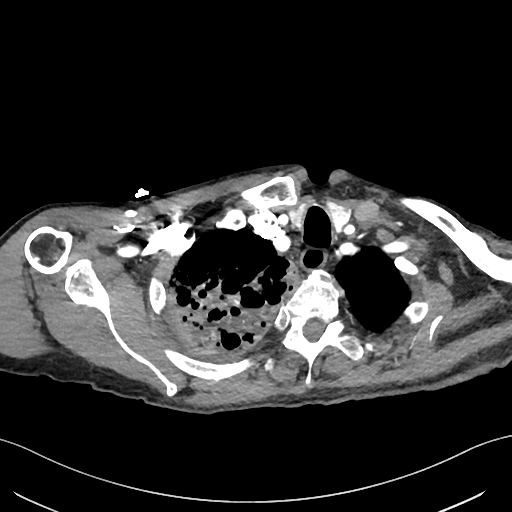

[Series 10: cor · coronal · 0.83mm/px · 3 of 126 slices shown]
[im 32/126  soft-tissue]
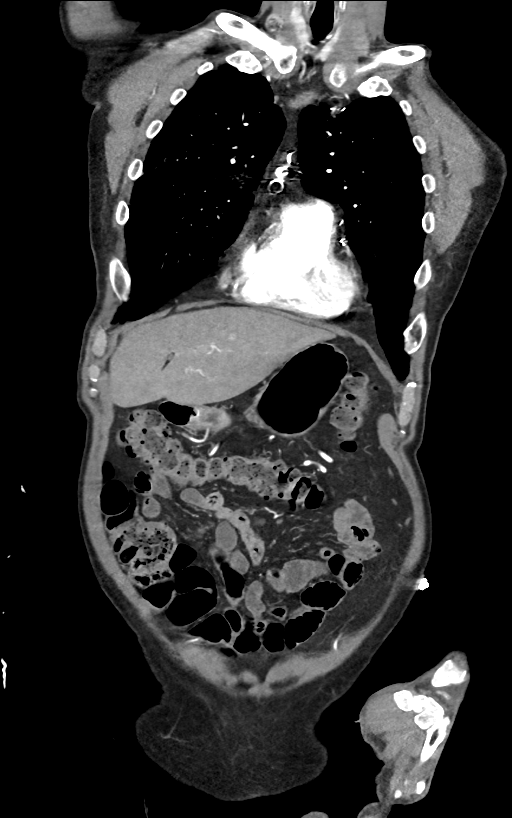
[im 63/126  soft-tissue]
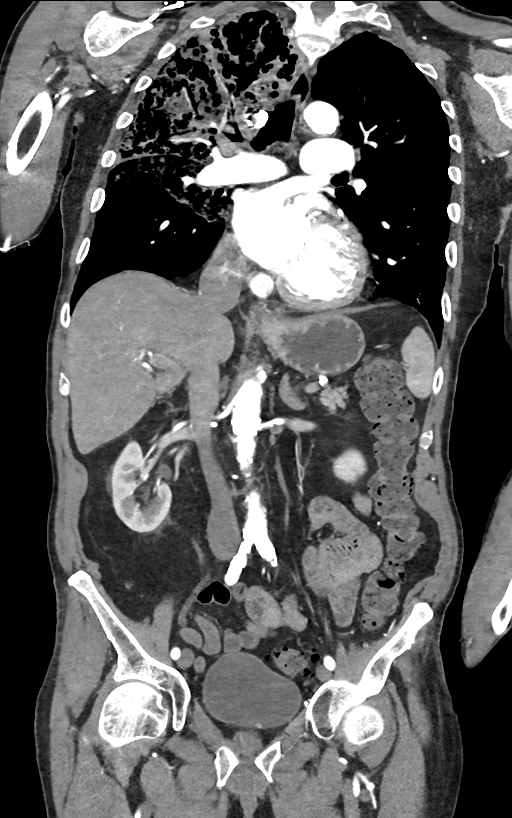
[im 94/126  soft-tissue]
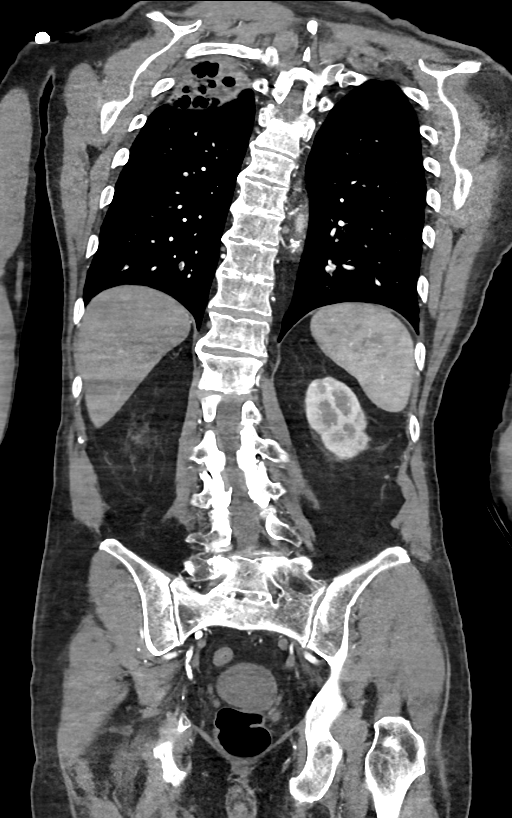

[13 of 46 positions shown; findings below may reference images not displayed]

Multidetector CT imaging through the chest, abdomen and pelvis was
performed using the standard protocol during bolus administration of
intravenous contrast. Multiplanar reconstructed images and MIPs were
obtained and reviewed to evaluate the vascular anatomy.

RADIATION DOSE REDUCTION: This exam was performed according to the
departmental dose-optimization program which includes automated
exposure control, adjustment of the mA and/or kV according to
patient size and/or use of iterative reconstruction technique.

CONTRAST:  100mL OMNIPAQUE IOHEXOL 350 MG/ML SOLN
FINDINGS: CTA CHEST FINDINGS

Cardiovascular: There is moderate severity calcification of the
aortic arch, without evidence of aortic aneurysm or dissection. A
small amount of intraluminal low attenuation is seen within the
posteromedial lower lobe branch of the right pulmonary artery (axial
CT image 109 through 116, CT series 7). Normal heart size without
right heart strain (RV/LV ratio of 0.91). No pericardial effusion.

Mediastinum/Nodes: No enlarged mediastinal, hilar, or axillary lymph
nodes. Thyroid gland, trachea, and esophagus demonstrate no
significant findings.

Lungs/Pleura: There is moderate severity, predominant bilateral
upper lobe emphysematous lung disease.

Marked severity posterior right upper lobe and mild posterior right
middle lobe infiltrate is seen.

A 7 mm partially calcified left upper lobe lung nodule is noted
(axial CT image 37, CT series 8).

There is no evidence of a pleural effusion or pneumothorax.

Musculoskeletal: Multilevel degenerative changes seen throughout the
thoracic spine.

Review of the MIP images confirms the above findings.

CTA ABDOMEN AND PELVIS FINDINGS

VASCULAR

Aorta: Marked severity calcification of a caliber aorta without
aneurysm, dissection, vasculitis or significant stenosis.

Celiac: Patent without evidence of aneurysm, dissection, vasculitis
or significant stenosis.

SMA: Patent without evidence of aneurysm, dissection, vasculitis or
significant stenosis.

Renals: Both renal arteries are patent without evidence of aneurysm,
dissection, vasculitis, fibromuscular dysplasia or significant
stenosis.

IMA: Patent without evidence of aneurysm, dissection, vasculitis or
significant stenosis.

Inflow: Marked severity calcification and atherosclerosis without
evidence of aneurysm, dissection, vasculitis or significant
stenosis.

Veins: No obvious venous abnormality within the limitations of this
arterial phase study.

Review of the MIP images confirms the above findings.

NON-VASCULAR

Hepatobiliary: No focal liver abnormality is seen. Multiple small
gallstones are seen within the lumen of an otherwise
normal-appearing gallbladder.

Pancreas: Unremarkable. No pancreatic ductal dilatation or
surrounding inflammatory changes.

Spleen: A 5.0 cm x 2.6 cm x 5.0 cm area of heterogeneous parenchymal
low attenuation is seen within the posteromedial aspect of the
spleen.

Adrenals/Urinary Tract: Adrenal glands are unremarkable. Kidneys are
normal in size, without renal calculi or hydronephrosis. Bilateral
simple renal cysts are seen. The largest measures approximately
cm x 4.1 cm and is seen within the left kidney. No additional
follow-up for imaging is recommended. Bladder is unremarkable.

Stomach/Bowel: Stomach is within normal limits. Appendix appears
normal. No evidence of bowel wall thickening, distention, or
inflammatory changes. Noninflamed diverticula are seen within the
descending and sigmoid colon.

Lymphatic: No abnormal abdominal or pelvic lymph nodes are
identified.

Reproductive: There is mild to moderate severity prostate gland
enlargement.

Other: No abdominal wall hernia or abnormality. No abdominopelvic
ascites.

Musculoskeletal: Multilevel degenerative changes seen throughout the
lumbar spine.

Review of the MIP images confirms the above findings.
IMPRESSION: 1. Mild amount of pulmonary embolism within a lower lobe branch of
the right pulmonary artery.
2. Marked severity right upper lobe and mild right middle lobe
infiltrate.
3. Cholelithiasis.
4. Colonic diverticulosis.
5. Findings within the posteromedial aspect of the spleen which may
represent a splenic infarct of indeterminate age.
6. Mild to moderate severity prostatomegaly. Correlation with PSA
levels is recommended.

Aortic Atherosclerosis ([PH]-[PH]).

## 2021-07-22 IMAGING — CT CT HEAD W/O CM
3 of 4 series · 15 of 47 positions shown, 18 images · non-contrast
Comparison: None Available.

CLINICAL DATA: Mental status change, unknown cause



[Series 4: head 2.0 h70h · axial · 0.47mm/px · z∈[-165,-33]mm · 9 of 84 slices shown, 12 images]
[im 9/84  brain]
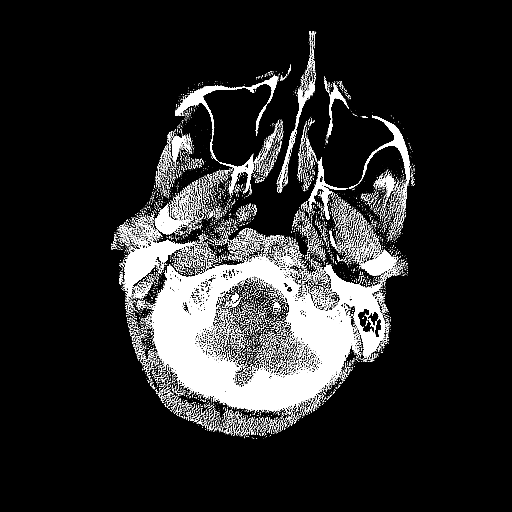
[im 9/84  bone]
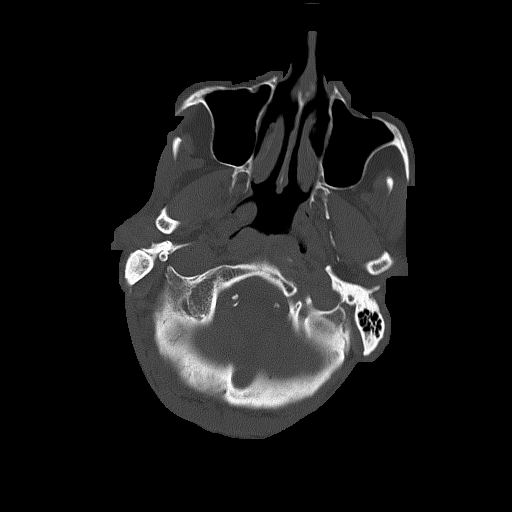
[im 17/84  brain]
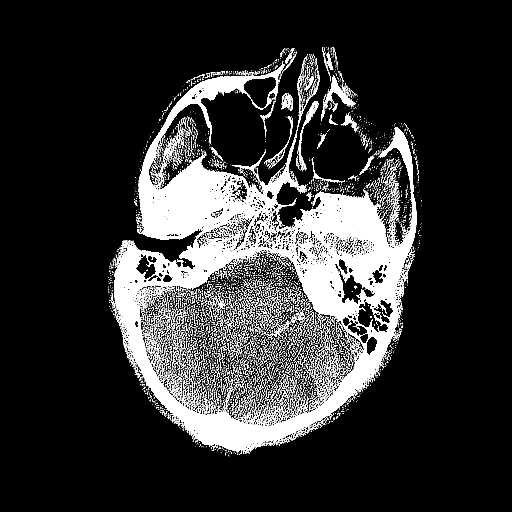
[im 25/84  brain]
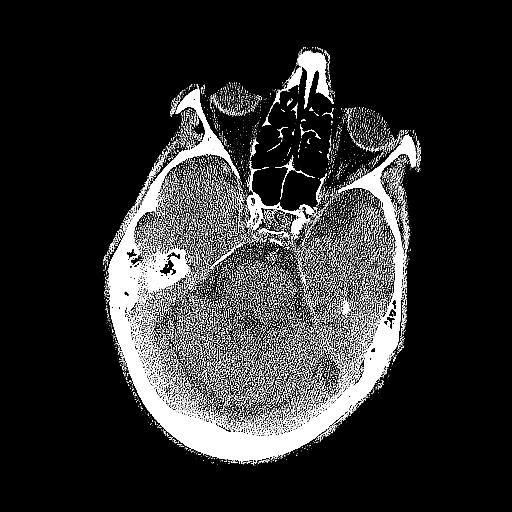
[im 34/84  brain]
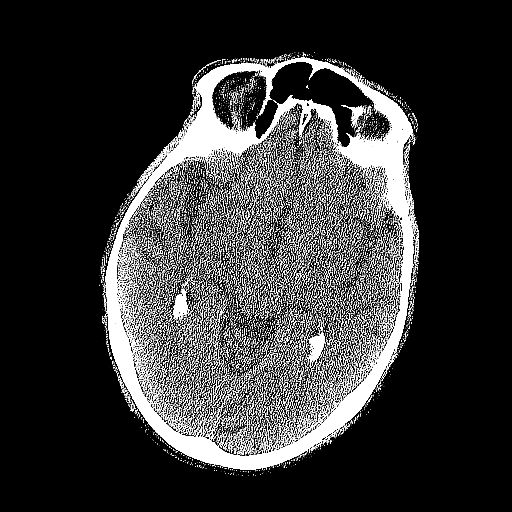
[im 42/84  brain]
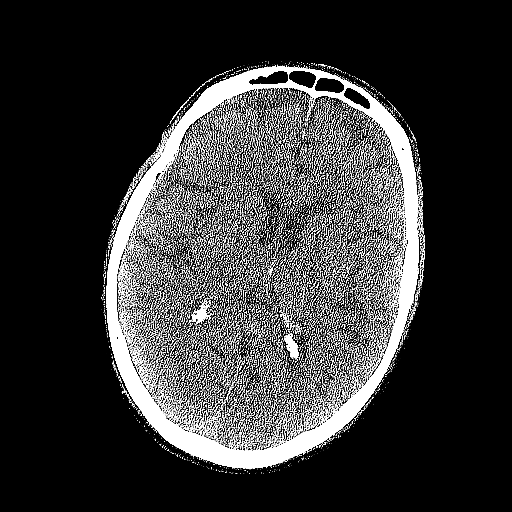
[im 42/84  bone]
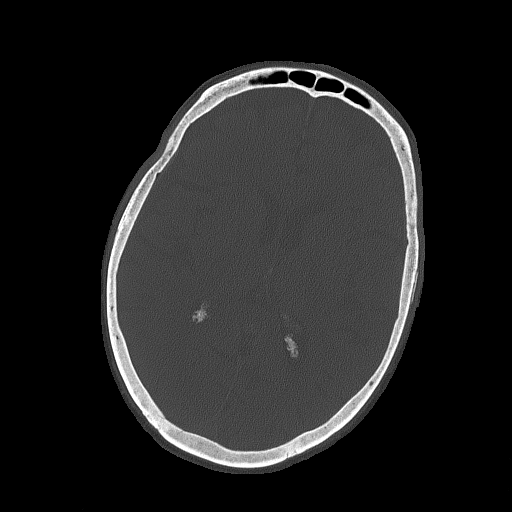
[im 50/84  brain]
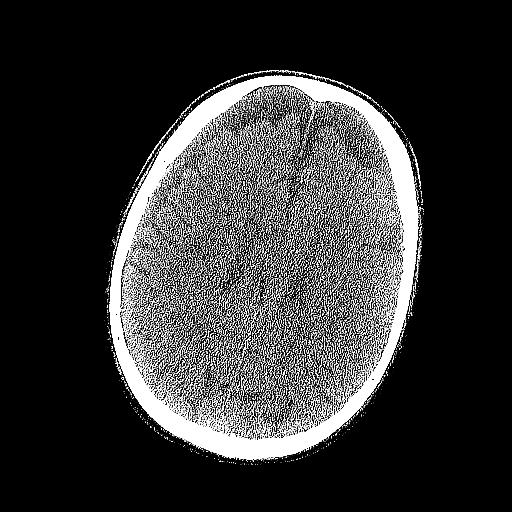
[im 59/84  brain]
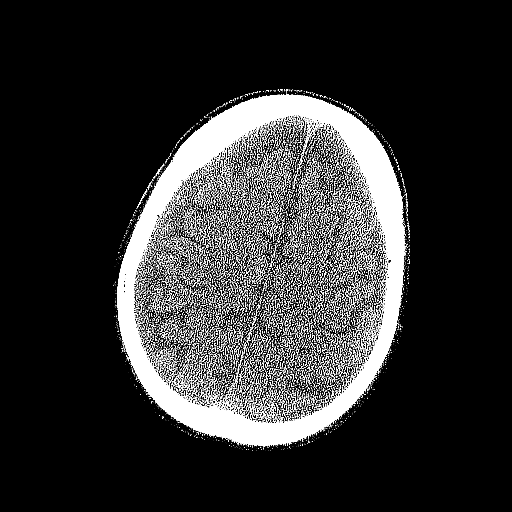
[im 67/84  brain]
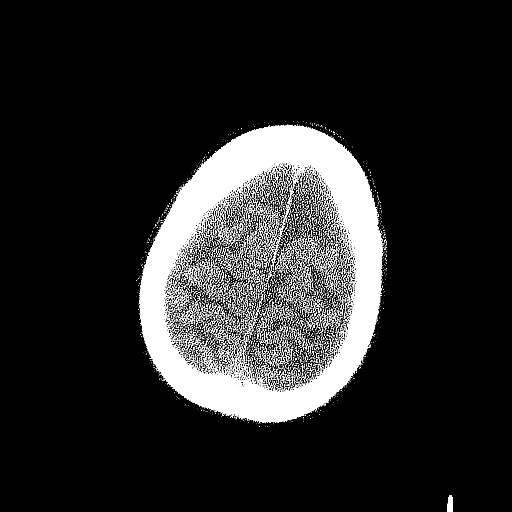
[im 75/84  brain]
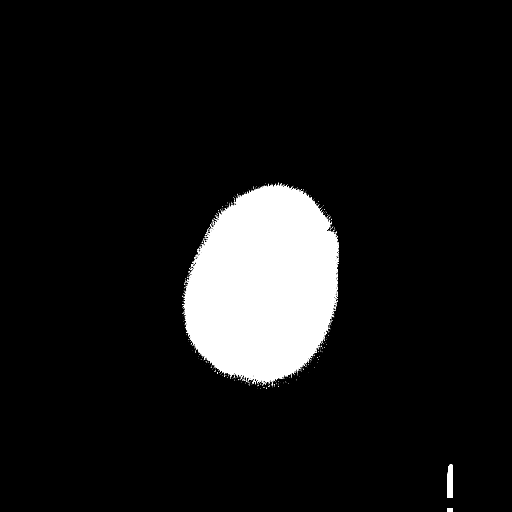
[im 75/84  bone]
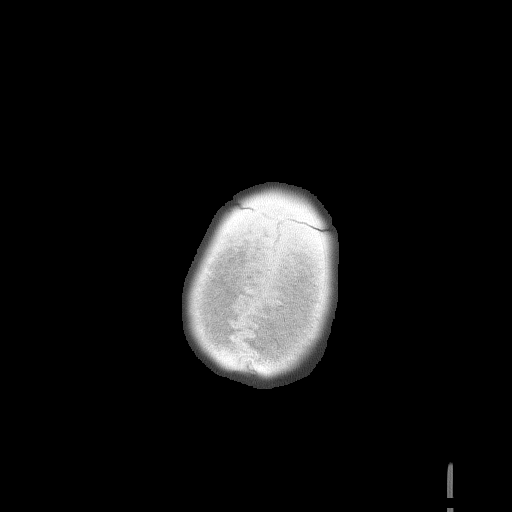

[Series 5: head 3.0 mpr cor · coronal · 0.33mm/px · 3 of 77 slices shown]
[im 26/77  brain]
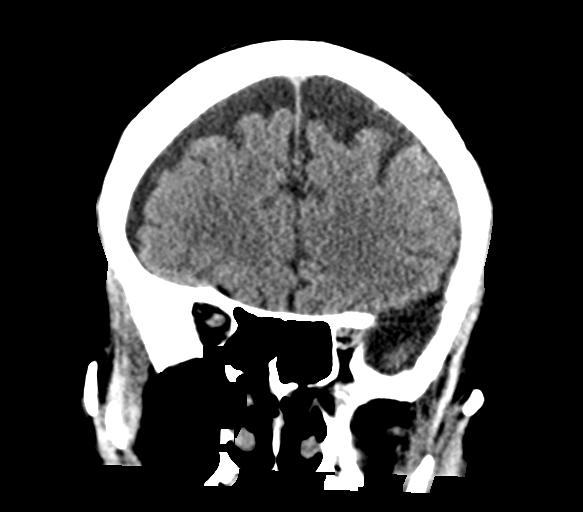
[im 34/77  brain]
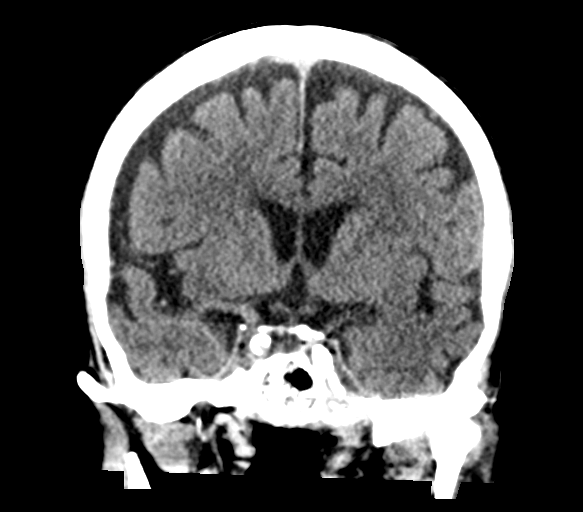
[im 43/77  brain]
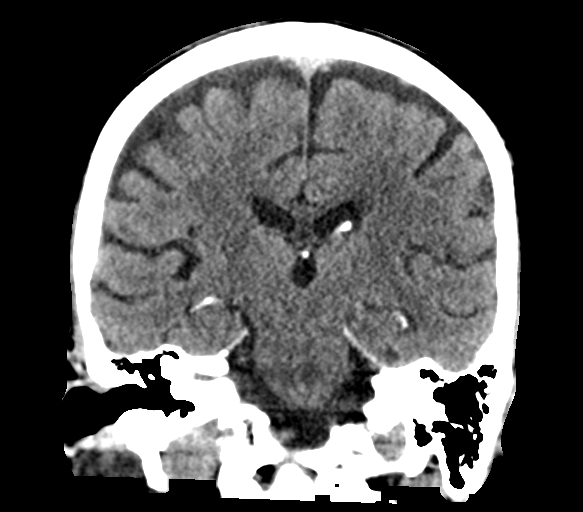

[Series 6: head 3.0 mpr sag · sagittal · 0.33mm/px · 3 of 61 slices shown]
[im 21/61  brain]
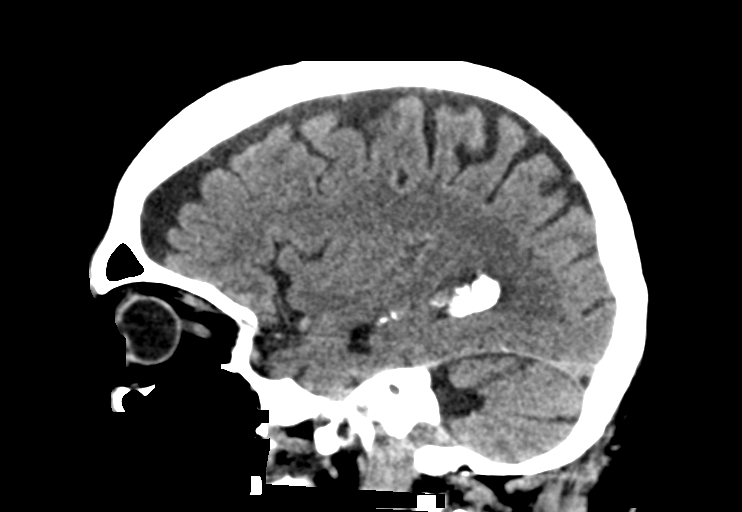
[im 31/61  brain]
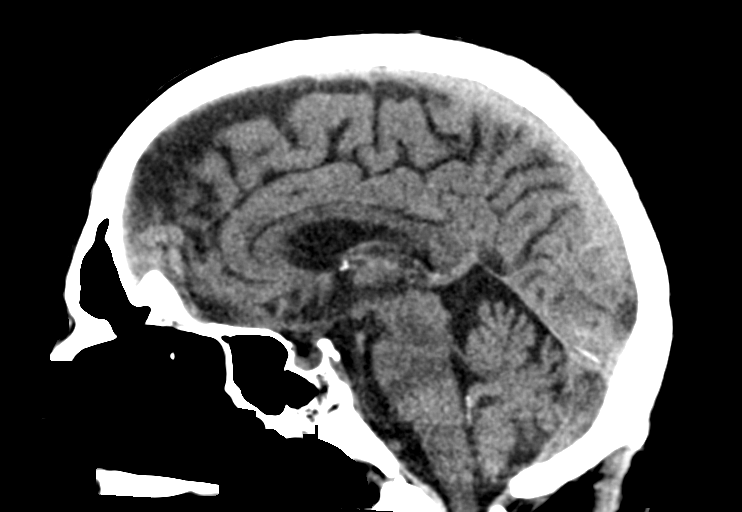
[im 41/61  brain]
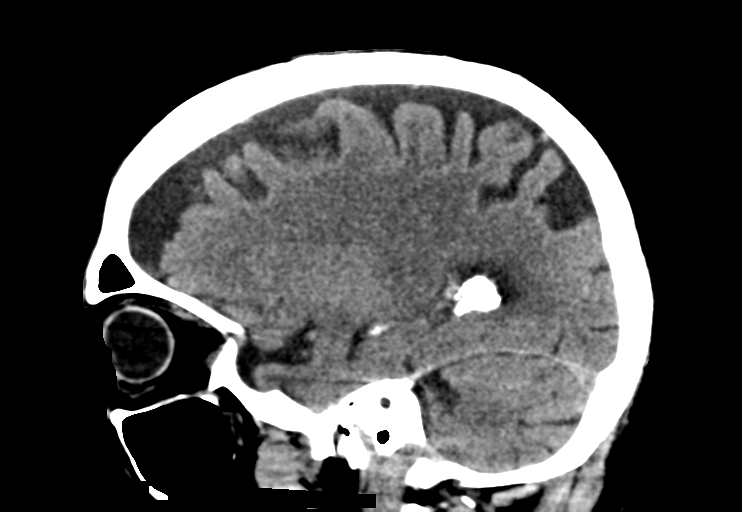

[15 of 47 positions shown; findings below may reference images not displayed]

FINDINGS: Brain: No evidence of acute infarction, hemorrhage, hydrocephalus,
extra-axial collection or mass lesion/mass effect. Cerebral atrophy
with prominence of the extra-axial spaces bilaterally. Patchy white
matter hypodensities, nonspecific but compatible with chronic
microvascular ischemic disease. Remote bilateral cerebellar lacunar
infarcts.

Vascular: No hyperdense vessel identified.

Skull: No acute fracture.

Sinuses/Orbits: Clear visualized sinuses. No acute orbital findings.

Other: Trace right mastoid effusion.
IMPRESSION: 1. No evidence of acute intracranial abnormality.
2.  Cerebral atrophy ([WG]-[WG]).
3. Remote bilateral cerebellar lacunar infarcts.

## 2021-07-22 IMAGING — DX DG CHEST 1V PORT
1 series · 2 of 2 positions shown · non-contrast
Comparison: Chest radiograph [DATE].

CLINICAL DATA: ams

EXAM:
PORTABLE CHEST 1 VIEW

[Series 1: chest · 0.14mm/px · 2 of 2 slices shown]
[im 1/2]
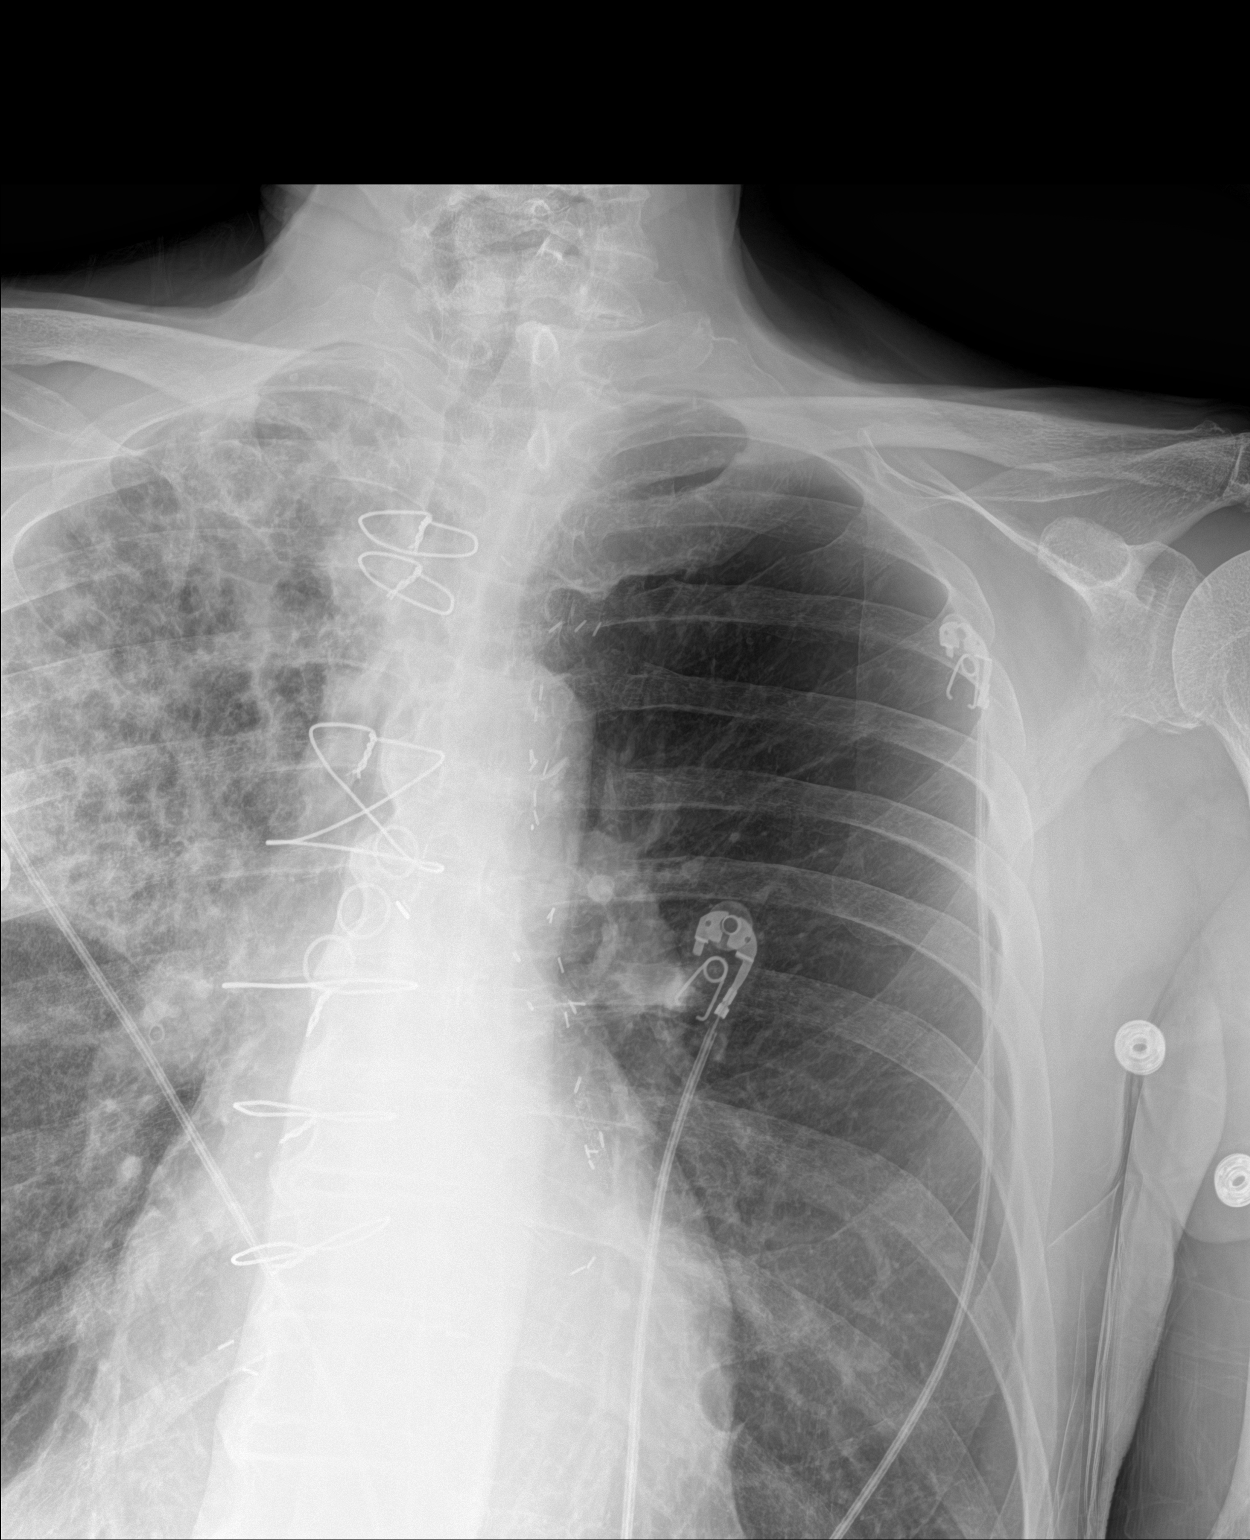
[im 2/2]
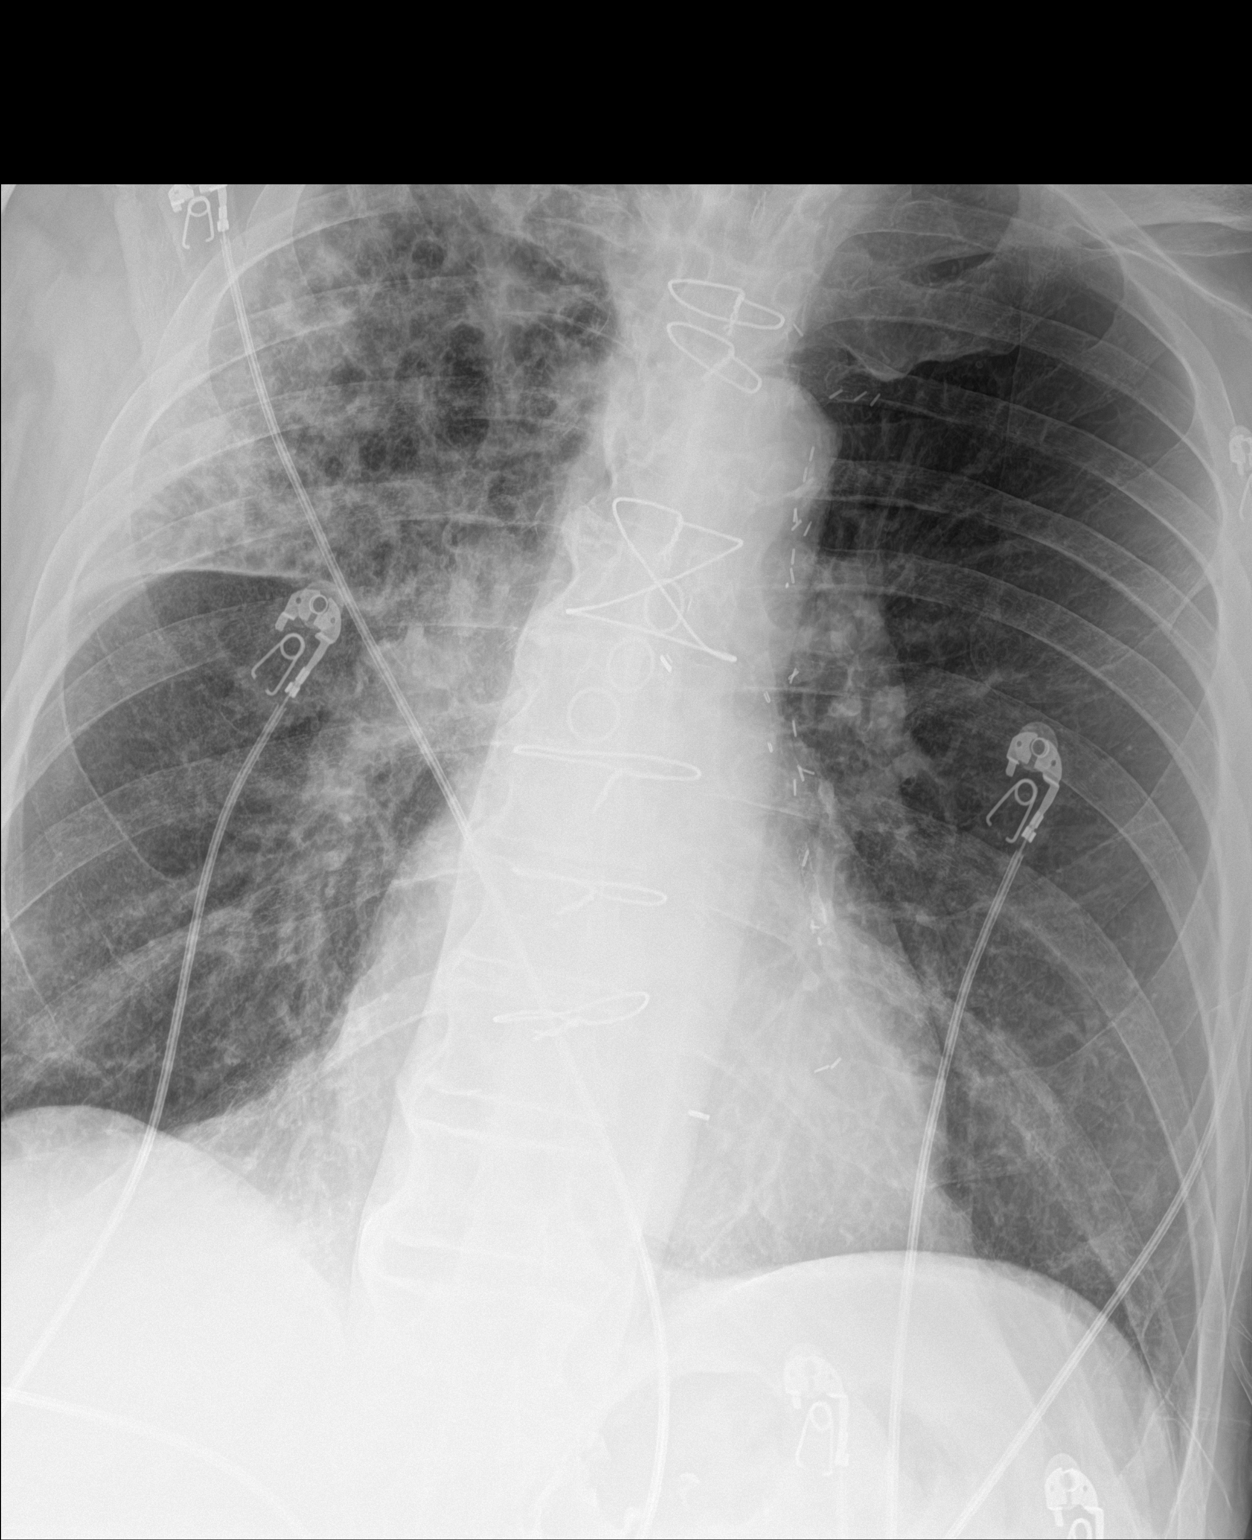

[2 of 2 positions shown; findings below may reference images not displayed]

FINDINGS: Increasing patchy airspace opacities in the right upper lobe. Left
lung is clear. No visible pleural effusions or pneumothorax. The
right costophrenic sulcus is not imaged. Cardiomediastinal
silhouette is unchanged. CABG and median sternotomy.
IMPRESSION: Increasing patchy airspace opacities in the right upper lobe,
concerning for progressive pneumonia. Followup PA and lateral chest
X-ray is recommended in 3-4 weeks following trial of antibiotic
therapy to ensure resolution and exclude underlying malignancy.

## 2021-07-22 MED ORDER — FENTANYL CITRATE PF 50 MCG/ML IJ SOSY: 25.0000 ug | PREFILLED_SYRINGE | INTRAMUSCULAR | Status: DC | PRN

## 2021-07-22 MED ORDER — LACTATED RINGERS IV BOLUS (SEPSIS)
1000.0000 mL | Freq: Once | INTRAVENOUS | Status: AC
  Administered 2021-07-22: 1000 mL via INTRAVENOUS

## 2021-07-22 MED ORDER — VANCOMYCIN HCL 1250 MG/250ML IV SOLN: 1250.0000 mg | INTRAVENOUS | Status: DC

## 2021-07-22 MED ORDER — SODIUM CHLORIDE 0.9 % IV SOLN
2.0000 g | Freq: Once | INTRAVENOUS | Status: AC
  Administered 2021-07-22: 2 g via INTRAVENOUS
  Filled 2021-07-22: qty 12.5

## 2021-07-22 MED ORDER — FENTANYL CITRATE PF 50 MCG/ML IJ SOSY
25.0000 ug | PREFILLED_SYRINGE | Freq: Once | INTRAMUSCULAR | Status: AC
  Administered 2021-07-22: 25 ug via INTRAVENOUS
  Filled 2021-07-22: qty 1

## 2021-07-22 MED ORDER — HEPARIN (PORCINE) 25000 UT/250ML-% IV SOLN: 1300.0000 [IU]/h | INTRAVENOUS | Status: DC

## 2021-07-22 MED ORDER — VANCOMYCIN HCL 1500 MG/300ML IV SOLN: 1500.0000 mg | INTRAVENOUS | Status: DC

## 2021-07-22 MED ORDER — ACETAMINOPHEN 325 MG PO TABS
650.0000 mg | ORAL_TABLET | Freq: Four times a day (QID) | ORAL | Status: DC | PRN
  Administered 2021-07-24: 650 mg via ORAL
  Filled 2021-07-22: qty 2

## 2021-07-22 MED ORDER — BOOST / RESOURCE BREEZE PO LIQD CUSTOM
1.0000 | Freq: Three times a day (TID) | ORAL | Status: DC
  Administered 2021-07-23 – 2021-07-25 (×3): 1 via ORAL
  Filled 2021-07-22: qty 1

## 2021-07-22 MED ORDER — ACETAMINOPHEN 650 MG RE SUPP
650.0000 mg | Freq: Four times a day (QID) | RECTAL | Status: DC | PRN
  Administered 2021-07-22 – 2021-07-23 (×2): 650 mg via RECTAL
  Filled 2021-07-22 (×2): qty 1

## 2021-07-22 MED ORDER — SODIUM CHLORIDE 0.9 % IV SOLN
2.0000 g | Freq: Three times a day (TID) | INTRAVENOUS | Status: DC
  Administered 2021-07-23: 2 g via INTRAVENOUS
  Filled 2021-07-22: qty 12.5

## 2021-07-22 MED ORDER — HYDROMORPHONE HCL 1 MG/ML IJ SOLN
0.5000 mg | Freq: Once | INTRAMUSCULAR | Status: AC | PRN
  Administered 2021-07-22: 0.5 mg via INTRAVENOUS
  Filled 2021-07-22: qty 1

## 2021-07-22 MED ORDER — HYDROXYZINE HCL 25 MG PO TABS: 25.0000 mg | ORAL_TABLET | Freq: Three times a day (TID) | ORAL | Status: DC | PRN

## 2021-07-22 MED ORDER — VANCOMYCIN HCL 1750 MG/350ML IV SOLN
1750.0000 mg | Freq: Once | INTRAVENOUS | Status: AC
  Administered 2021-07-22: 1750 mg via INTRAVENOUS
  Filled 2021-07-22: qty 350

## 2021-07-22 MED ORDER — LACTATED RINGERS IV SOLN: INTRAVENOUS | Status: DC

## 2021-07-22 MED ORDER — IOHEXOL 350 MG/ML SOLN
100.0000 mL | Freq: Once | INTRAVENOUS | Status: AC | PRN
  Administered 2021-07-22: 100 mL via INTRAVENOUS

## 2021-07-22 MED ORDER — INSULIN ASPART 100 UNIT/ML IJ SOLN
0.0000 [IU] | Freq: Three times a day (TID) | INTRAMUSCULAR | Status: DC
  Administered 2021-07-23 (×2): 2 [IU] via SUBCUTANEOUS
  Administered 2021-07-23 – 2021-07-24 (×3): 3 [IU] via SUBCUTANEOUS
  Administered 2021-07-25 – 2021-07-26 (×2): 2 [IU] via SUBCUTANEOUS
  Administered 2021-07-26 (×2): 3 [IU] via SUBCUTANEOUS

## 2021-07-22 MED ORDER — TICAGRELOR 60 MG PO TABS
60.0000 mg | ORAL_TABLET | Freq: Two times a day (BID) | ORAL | Status: DC
  Administered 2021-07-23 (×2): 60 mg via ORAL
  Filled 2021-07-22 (×3): qty 1

## 2021-07-22 MED ORDER — ENOXAPARIN SODIUM 80 MG/0.8ML IJ SOSY
80.0000 mg | PREFILLED_SYRINGE | Freq: Two times a day (BID) | INTRAMUSCULAR | Status: DC
  Administered 2021-07-22 – 2021-07-23 (×2): 80 mg via SUBCUTANEOUS
  Filled 2021-07-22 (×3): qty 0.8

## 2021-07-22 MED ORDER — HEPARIN BOLUS VIA INFUSION
5000.0000 [IU] | Freq: Once | INTRAVENOUS | Status: DC
  Filled 2021-07-22: qty 5000

## 2021-07-22 MED ORDER — ROSUVASTATIN CALCIUM 20 MG PO TABS
20.0000 mg | ORAL_TABLET | Freq: Every day | ORAL | Status: DC
  Administered 2021-07-23 – 2021-07-26 (×4): 20 mg via ORAL
  Filled 2021-07-22 (×4): qty 1

## 2021-07-22 NOTE — Progress Notes (Signed)
Pharmacy Antibiotic Note  Xavier Giles is a 81 y.o. male admitted on 07/22/2021 with pneumonia.  Tm 102.2 F, WBC 10, CXR with increasing patchy airspace opacities in RUL concerning for progressive pneumonia. Pharmacy has been consulted for Vancomycin dosing.  Of note, patient was recent admitted (5/17-5/18) for CAP and received Levofloxacin 750 mg IV x 1, and was discharged on Augmentin and Doxycycline x 1 week.   Plan: Vancomycin 1750 mg IV x 1, followed by 1250 mg IV q24h (eAUC 446.2, SCr 1.15, goal AUC 400-550) Cefepime 2 g IV x 1 per MD Follow-up clinical status, renal function Follow-up cultures, LOT, de-escalate as able  Obtain Vancomycin levels as appropriate   Height: '5\' 11"'$  (180.3 cm) Weight: 77.3 kg (170 lb 6.7 oz) IBW/kg (Calculated) : 75.3  No data recorded.  Recent Labs  Lab 07/16/21 1512 07/16/21 1525 07/16/21 1533 07/17/21 0156 07/22/21 1656  WBC 11.9*  --   --  9.8 10.0  CREATININE 1.16  --  1.00 1.02  --   LATICACIDVEN  --  1.3  --   --   --     Estimated Creatinine Clearance: 59.5 mL/min (by C-G formula based on SCr of 1.02 mg/dL).    Allergies  Allergen Reactions   Keflex [Cephalexin] Other (See Comments)    Unknown ;   Patient states he is not aware of being allergic to cephalexin per [facility] resident MD report.     Zocor [Simvastatin] Other (See Comments)    Unknown    Lipitor [Atorvastatin] Other (See Comments)    Elevated blood sugar   Sulfa Antibiotics Nausea Only    Antimicrobials this admission: Vancomycin 5/23 >>  Cefepime 5/23   Microbiology results: 5/23 BCx: pending 5/23 UCx: pending     Thank you for allowing pharmacy to be a part of this patient's care.  Vance Peper, PharmD PGY1 Pharmacy Resident 07/22/2021 6:12 PM   Please check AMION for all [place] phone numbers After 10:00 PM, call [place] [phone]

## 2021-07-22 NOTE — Progress Notes (Addendum)
ANTICOAGULATION CONSULT NOTE - Initial Consult  Pharmacy Consult for Lovenox Indication: pulmonary embolus  Allergies  Allergen Reactions   Keflex [Cephalexin] Other (See Comments)    Unknown ;   Patient states he is not aware of being allergic to cephalexin per [facility] resident MD report.     Zocor [Simvastatin] Other (See Comments)    Unknown    Lipitor [Atorvastatin] Other (See Comments)    Elevated blood sugar   Sulfa Antibiotics Nausea Only    Patient Measurements: Height: '5\' 11"'$  (180.3 cm) Weight: 77.3 kg (170 lb 6.7 oz) IBW/kg (Calculated) : 75.3 Heparin Dosing Weight: 77.3 kg  Vital Signs: Temp: 101.1 F (38.4 C) (05/23 2200) Temp Source: Oral (05/23 2200) BP: 135/75 (05/23 2200) Pulse Rate: 31 (05/23 2200)  Labs: Recent Labs    07/22/21 1656 07/22/21 1736  HGB 13.5 13.9  HCT 40.9 41.0  PLT 308  --   LABPROT 16.2*  --   INR 1.3*  --   CREATININE 1.15  --   TROPONINIHS 143*  --     Estimated Creatinine Clearance: 52.7 mL/min (by C-G formula based on SCr of 1.15 mg/dL).   Medical History: Past Medical History:  Diagnosis Date   Asthma    CAD (coronary artery disease) of bypass graft 2003   (now known CTO of SVG-rPDA & SVG-D1 & 3 stents in SeqSVg-Om1-OM2 (1 in prox leg, overlapping DES-BMS from OM1-OM2 into OM2 - s/p PTCA for ISR).; LIMA-LAD patent    CAD S/P PTCA-PCI of SeqSVG-OM1-OM2 2003; 01/2012; 05/2013   a) '03 SeqSVG-OM1-OM2,  Zeta BMS 3.0x8 Seq limb into OM2), & PTCA of SVg-D1 anastomosis; b) 2013: Distal Limb of SeqSVG-OM1-OM2 from OM1-2 overlap BMS ->Xience DES 2.75 x 15 - 29m;; c) 05/2013 Proxlimb SVG--OM1-OM2 -> Resolute DES 3 x 26 - 3.222m Angiosculpt PTCA of ISR in Seq Limb overlaping stents (3.25 mm)   CAD, multiple vessel - Native CAD. 1998; 2015   a) Referred for CABGx5 ; by Cath 05/2013: dLM 60%, Ost LAD 70&. Prox -mid LAD extensive 70%( to-fro flow); D1 80%; OM1 & OM2 (CTO), follow-on LCx beyond AVG 90+% with small OM3/OM4; RCA TO (R-R  collaterals);   Carotid bruit present 01/2012   a. 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 Also noted CTO of SVG-Diag; b) CRESCENDO ANGINA: 05/2013 - prox limb SVG-OM1-OM2 DES PCI & PTCA of 95% ISR in distal Limb overlapping DES-BMS; Myoview 08/25/13 - small non-T-mural Inf scar, No Ischemia   S/P CABG x 5 1998   LIMA-LAD, SeqSVG-OM1-OM2; SVG-rPDA, SVG-D1     Medications:  Scheduled:   [START ON 07/23/2021] rosuvastatin  20 mg Oral Daily   ticagrelor  60 mg Oral BID   Infusions:   [START ON 07/23/2021] ceFEPime (MAXIPIME) IV     lactated ringers 150 mL/hr at 07/22/21 2053   [START ON 07/23/2021] vancomycin     vancomycin 1,750 mg (07/22/21 2058)    Assessment: [age]o M presenting with AMS, not on anticoagulation PTA.  CT chest with mild amount of pulmonary embolism within a lower lobe branch of the right pulmonary artery. Baseline Hgb 13.9, plt 308. No signs/symptoms of bleeding noted. Pharmacy initially consulted for heparin, now consulted to switch to enoxaparin.   Goal of Therapy:  Monitor platelets by anticoagulation protocol: Yes   Plan:  Enoxaparin 80 mg SQ q12h. Monitor CBC and for signs/symptoms of bleeding.  Follow-up plans for oral anticoagulation.    Vance Peper, PharmD PGY1 Pharmacy Resident 07/22/2021 10:07 PM   Please check AMION for all [place] phone numbers After 10:00 PM, call [place] [phone]

## 2021-07-22 NOTE — ED Provider Notes (Signed)
  [facility] EMERGENCY DEPARTMENT Provider Note   CSN: [id number] Arrival date & time: 07/22/21  1615     History {Add pertinent medical, surgical, social history, OB history to HPI:1} Chief Complaint  Patient presents with   Altered Mental Status    Xavier Giles is a 81 y.o. male.   Altered Mental Status     Home Medications Prior to Admission medications   Medication Sig Start Date End Date Taking? Authorizing Provider  amLODipine-benazepril (LOTREL) 10-20 MG capsule TAKE 1 CAPSULE BY MOUTH DAILY 08/06/20   [name], MD  amoxicillin-clavulanate (AUGMENTIN) 500-125 MG tablet Take 1 tablet (500 mg total) by mouth 3 (three) times daily. 07/17/21   Orvis Brill, MD  atenolol (TENORMIN) 50 MG tablet Take 1 tablet (50 mg total) by mouth daily. 04/02/21   [name], MD  doxycycline (VIBRA-TABS) 100 MG tablet Take 1 tablet (100 mg total) by mouth every 12 (twelve) hours. 07/17/21   Orvis Brill, MD  isosorbide mononitrate (IMDUR) 60 MG 24 hr tablet Take 1 tablet by mouth once daily 01/27/21   [name], MD  nitroGLYCERIN (NITROSTAT) 0.4 MG SL tablet Place 1 tablet (0.4 mg total) under the tongue every 5 (five) minutes as needed (up to 3 doses). 06/30/13   Dunn, Nedra Hai, [name]  rosuvastatin (CRESTOR) 20 MG tablet Take 1 tablet by mouth once daily 10/01/20   [name], MD  ticagrelor (BRILINTA) 60 MG TABS tablet Take 1 tablet (60 mg total) by mouth 2 (two) times daily. 07/22/18   [name], MD      Allergies    Keflex [cephalexin], Zocor [simvastatin], Lipitor [atorvastatin], and Sulfa antibiotics    Review of Systems   Review of Systems  Physical Exam Updated Vital Signs BP (!) 148/80 (BP Location: Left Arm)   Pulse 90   Resp 17   SpO2 96%  Physical Exam  ED Results / Procedures / Treatments   Labs (all labs ordered are listed, but only abnormal results are displayed) Labs Reviewed - No data to  display  EKG None  Radiology No results found.  Procedures Procedures  {Document cardiac monitor, telemetry assessment procedure when appropriate:1}  Medications Ordered in ED Medications - No data to display  ED Course/ Medical Decision Making/ A&P                           Medical Decision Making  ***  {Document critical care time when appropriate:1} {Document review of labs and clinical decision tools ie heart score, Chads2Vasc2 etc:1}  {Document your independent review of radiology images, and any outside records:1} {Document your discussion with family members, caretakers, and with consultants:1} {Document social determinants of health affecting pt's care:1} {Document your decision making why or why not admission, treatments were needed:1} Final Clinical Impression(s) / ED Diagnoses Final diagnoses:  None    Rx / DC Orders ED Discharge Orders     None

## 2021-07-22 NOTE — H&P (Signed)
Date: 07/22/2021               Patient Name:  Xavier Giles MRN: [id number]  DOB: 1940-12-25 Age / Sex: 81 y.o., male   PCP: [name], MD         Medical Service: Internal Medicine Teaching Service         Attending Physician: Dr. Johnnye Sima Doroteo Bradford, MD    First Contact: Mitzie Na, MD Pager: MB [phone]  Second Contact: Rick Duff, MD Pager: [phone]       After Hours (After 5p/  First Contact Pager: [phone]  weekends / holidays): Second Contact Pager: [phone]   SUBJECTIVE  Chief Complaint: altered mental status  History of Present Illness: Xavier Giles is a 81 y.o. male with a pertinent PMH of CAD s/p CABG and multiple PCI stents, hypertension, hyperlipidemia and recently admitted for community acquired pneumonia who presents to [facility] with altered mental status. History obtained by daughter and son-in-law at bedside.  Patient was recently admitted 5/17-5/18 for community acquired pneumonia with initial oxygen requirement of 2L but was weaned to room air and discharged on Augmentin and doxycycline. Patient has been compliant with his medication regimen. At home, patient has been complaining of ongoing chest pain but reported it to be tolerated. Per daughter, patient only had one "good day" since being discharged from the hospital. Reported to have decreased oral intake but has been supplementing with Boost/Ensures. However, had not eaten anything on the day of admission. He has had waxing and waning mental status; however, has always been oriented to location but not always the day. However, when his son-in-law checked on him today, patient was notably confused and disoriented with decreased movement of the right upper extremity and edema of the right lower extremity. Patient reportedly complaining of right leg pain. Patient's family denies any recent fevers/chills, urinary symptoms, nausea/vomiting, change in bowel habits or worsening shortness of breath.   In the  ED, the patient was noted to be febrile to 102.2, tachypneic with RR 28, and mildly tachycardic to 108 with new oxygen requirement of 2L O2 via [place]. Code sepsis alerted. No significant leukocytosis noted on CBC. CMP  without  renal injury but did show new AST/ALT elevation to 97/146, normal alkaline phosphatase and total bilirubin. Mildly elevated PT/INR to 16.2/1.3. Urinalysis with mild ketonuria, proteinuria but no evidence of UTI and resolved hematuria. Lactic acid nl. Troponin 143. Chest x-ray with increasing RUL opacities, concerning for pneumonia. CT Head wo contrast negative for acute intracranial abnormality. Patient given 1L LR and started on IV fluids for resuscitation and given vancomycin and cefepime. MRI Brain wo contrast and CTA Chest/Abd/Pelvis ordered. Patient admitted for further evaluation and management.   Medications: No current facility-administered medications on file prior to encounter.   Current Outpatient Medications on File Prior to Encounter  Medication Sig Dispense Refill   amLODipine-benazepril (LOTREL) 10-20 MG capsule TAKE 1 CAPSULE BY MOUTH DAILY 90 capsule 3   amoxicillin-clavulanate (AUGMENTIN) 500-125 MG tablet Take 1 tablet (500 mg total) by mouth 3 (three) times daily. 20 tablet 0   atenolol (TENORMIN) 50 MG tablet Take 1 tablet (50 mg total) by mouth daily. 90 tablet 3   doxycycline (VIBRA-TABS) 100 MG tablet Take 1 tablet (100 mg total) by mouth every 12 (twelve) hours. 14 tablet 0   isosorbide mononitrate (IMDUR) 60 MG 24 hr tablet Take 1 tablet by mouth once daily 30 tablet 5   Multiple Vitamins-Minerals (PRESERVISION AREDS  PO) Take 1 tablet by mouth in the morning and at bedtime.     naproxen sodium (ALEVE) 220 MG tablet Take 220 mg by mouth daily as needed (For pain).     nitroGLYCERIN (NITROSTAT) 0.4 MG SL tablet Place 1 tablet (0.4 mg total) under the tongue every 5 (five) minutes as needed (up to 3 doses). 25 tablet 3   rosuvastatin (CRESTOR) 20 MG tablet  Take 1 tablet by mouth once daily 90 tablet 3   Saw Palmetto, Serenoa repens, (SAW PALMETTO PO) Take 1 tablet by mouth daily.     ticagrelor (BRILINTA) 60 MG TABS tablet Take 1 tablet (60 mg total) by mouth 2 (two) times daily. 180 tablet 3   Past Medical History:  Past Medical History:  Diagnosis Date   Asthma    CAD (coronary artery disease) of bypass graft 2003   (now known CTO of SVG-rPDA & SVG-D1 & 3 stents in SeqSVg-Om1-OM2 (1 in prox leg, overlapping DES-BMS from OM1-OM2 into OM2 - s/p PTCA for ISR).; LIMA-LAD patent    CAD S/P PTCA-PCI of SeqSVG-OM1-OM2 2003; 01/2012; 05/2013   a) '03 SeqSVG-OM1-OM2,  Zeta BMS 3.0x8 Seq limb into OM2), & PTCA of SVg-D1 anastomosis; b) 2013: Distal Limb of SeqSVG-OM1-OM2 from OM1-2 overlap BMS ->Xience DES 2.75 x 15 - 13m;; c) 05/2013 Proxlimb SVG--OM1-OM2 -> Resolute DES 3 x 26 - 3.238m Angiosculpt PTCA of ISR in Seq Limb overlaping stents (3.25 mm)   CAD, multiple vessel - Native CAD. 1998; 2015   a) Referred for CABGx5 ; by Cath 05/2013: dLM 60%, Ost LAD 70&. Prox -mid LAD extensive 70%( to-fro flow); D1 80%; OM1 & OM2 (CTO), follow-on LCx beyond AVG 90+% with small OM3/OM4; RCA TO (R-R collaterals);   Carotid bruit present 01/2012   a. 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 Also noted CTO of SVG-Diag; b) CRESCENDO ANGINA: 05/2013 - prox limb SVG-OM1-OM2 DES PCI & PTCA of 95% ISR in distal Limb overlapping DES-BMS; Myoview 08/25/13 - small non-T-mural Inf scar, No Ischemia   S/P CABG x 5 1998    LIMA-LAD, SeqSVG-OM1-OM2; SVG-rPDA, SVG-D1     Social:  [place] support from family; independent in ADLs and iADLs. Wife passed away 4 years ago.  Substance use - previously tobacco use with 1.5ppd for 30 years; quit when had MI. No alcohol or illicit substance use  No current PCP.   Family History: Family History  Problem Relation Age of Onset   Cancer Mother    Heart attack Father     Allergies: Allergies as of 07/22/2021 - Review Complete 07/22/2021  Allergen Reaction Noted   Keflex [cephalexin] Other (See Comments) 01/13/2012   Zocor [simvastatin] Other (See Comments) 01/13/2012   Lipitor [atorvastatin] Other (See Comments) 01/13/2012   Sulfa antibiotics Nausea Only 01/13/2012    Review of Systems: A complete ROS was negative except as per HPI.   OBJECTIVE:  Physical Exam: Blood pressure 135/75, pulse (!) 31, temperature (!)  101.1 F (38.4 C), temperature source Oral, resp. rate (!) 23, height '5\' 11"'$  (1.803 m), weight 77.3 kg, SpO2 97 %. Constitutional: confused, restless HENT: mucous membranes dry Cardiovascular: regular rate and rhythm, no m/r/g Pulmonary/Chest: normal work of breathing on room air, dullnessin RUL Abdominal: soft, non-tender, non-distended MSK: 1+ pitting edema in right lower extremity Neurological: pupils equal and reactive to light, moves limbs spontaneously, grasps cup when handed, does not follow commands, able to stand with support Skin: warm and dry Psych: agitated   Pertinent Labs: CBC    Component Value Date/Time   WBC 10.0 07/22/2021 1656   RBC 4.78 07/22/2021 1656   HGB 13.9 07/22/2021 1736   HGB 15.2 05/27/2021 0953   HCT 41.0 07/22/2021 1736   HCT 45.9 05/27/2021 0953   PLT 308 07/22/2021 1656   PLT 197 05/27/2021 0953   MCV 85.6 07/22/2021 1656   MCV 85 05/27/2021 0953   MCH 28.2 07/22/2021 1656   MCHC 33.0 07/22/2021 1656   RDW 13.6 07/22/2021 1656   RDW 13.6 05/27/2021 0953   LYMPHSABS 0.7  07/22/2021 1656   MONOABS 1.1 (H) 07/22/2021 1656   EOSABS 0.0 07/22/2021 1656   BASOSABS 0.1 07/22/2021 1656     CMP     Component Value Date/Time   NA 136 07/22/2021 1736   NA 141 05/27/2021 0953   K 4.3 07/22/2021 1736   CL 103 07/22/2021 1656   CO2 23 07/22/2021 1656   GLUCOSE 232 (H) 07/22/2021 1656   BUN 21 07/22/2021 1656   BUN 12 05/27/2021 0953   CREATININE 1.15 07/22/2021 1656   CREATININE 1.06 07/07/2016 0903   CALCIUM 8.2 (L) 07/22/2021 1656   PROT 5.2 (L) 07/22/2021 1656   PROT 6.9 05/27/2021 0953   ALBUMIN 2.4 (L) 07/22/2021 1656   ALBUMIN 4.9 (H) 05/27/2021 0953   AST 97 (H) 07/22/2021 1656   ALT 146 (H) 07/22/2021 1656   ALKPHOS 96 07/22/2021 1656   BILITOT 0.9 07/22/2021 1656   BILITOT 0.5 05/27/2021 0953   GFRNONAA >60 07/22/2021 1656   GFRAA 68 05/23/2019 0852    Pertinent Imaging: CT HEAD WO CONTRAST  Result Date: 07/22/2021 CLINICAL DATA:  Mental status change, unknown cause EXAM: CT HEAD WITHOUT CONTRAST TECHNIQUE: Contiguous axial images were obtained from the base of the skull through the vertex without intravenous contrast. RADIATION DOSE REDUCTION: This exam was performed according to the departmental dose-optimization program which includes automated exposure control, adjustment of the mA and/or kV according to patient size and/or use of iterative reconstruction technique. COMPARISON:  None Available. FINDINGS: Brain: No evidence of acute infarction, hemorrhage, hydrocephalus, extra-axial collection or mass lesion/mass effect. Cerebral atrophy with prominence of the extra-axial spaces bilaterally. Patchy white matter hypodensities, nonspecific but compatible with chronic microvascular ischemic disease. Remote bilateral cerebellar lacunar infarcts. Vascular: No hyperdense vessel identified. Skull: No acute fracture. Sinuses/Orbits: Clear visualized sinuses. No acute orbital findings. Other: Trace right mastoid effusion. IMPRESSION: 1. No evidence of acute  intracranial abnormality. 2.  Cerebral atrophy (ICD10-G31.9). 3. Remote bilateral cerebellar lacunar infarcts. Electronically Signed   By: Margaretha Sheffield M.D.   On: 07/22/2021 17:32   DG Chest Port 1 View  Result Date: 07/22/2021 CLINICAL DATA:  ams EXAM: PORTABLE CHEST 1 VIEW COMPARISON:  Chest radiograph May 17 23. FINDINGS: Increasing patchy airspace opacities in the right upper lobe. Left lung is clear. No visible pleural effusions or pneumothorax. The right costophrenic sulcus is not imaged. Cardiomediastinal silhouette is unchanged. CABG and median sternotomy.  IMPRESSION: Increasing patchy airspace opacities in the right upper lobe, concerning for progressive pneumonia. Followup PA and lateral chest X-ray is recommended in 3-4 weeks following trial of antibiotic therapy to ensure resolution and exclude underlying malignancy. Electronically Signed   By: Margaretha Sheffield M.D.   On: 07/22/2021 17:28   CT Angio Chest/Abd/Pel for Dissection W and/or Wo Contrast  Result Date: 07/22/2021 CLINICAL DATA:  Altered mental status. EXAM: CT ANGIOGRAPHY CHEST, ABDOMEN AND PELVIS TECHNIQUE: Non-contrast CT of the chest was initially obtained. Multidetector CT imaging through the chest, abdomen and pelvis was performed using the standard protocol during bolus administration of intravenous contrast. Multiplanar reconstructed images and MIPs were obtained and reviewed to evaluate the vascular anatomy. RADIATION DOSE REDUCTION: This exam was performed according to the departmental dose-optimization program which includes automated exposure control, adjustment of the mA and/or kV according to patient size and/or use of iterative reconstruction technique. CONTRAST:  167m OMNIPAQUE IOHEXOL 350 MG/ML SOLN COMPARISON:  June 28, 2013 FINDINGS: CTA CHEST FINDINGS Cardiovascular: There is moderate severity calcification of the aortic arch, without evidence of aortic aneurysm or dissection. A small amount of intraluminal  low attenuation is seen within the posteromedial lower lobe branch of the right pulmonary artery (axial CT image 109 through 116, CT series 7). Normal heart size without right heart strain (RV/LV ratio of 0.91). No pericardial effusion. Mediastinum/Nodes: No enlarged mediastinal, hilar, or axillary lymph nodes. Thyroid gland, trachea, and esophagus demonstrate no significant findings. 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 Marked severity posterior right upper lobe and mild posterior right middle lobe infiltrate is seen. A 7 mm partially calcified left upper lobe lung nodule is noted (axial CT image 37, CT series 8). There is no evidence of a pleural effusion or pneumothorax. Musculoskeletal: Multilevel degenerative changes seen throughout the thoracic spine. Review of the MIP images confirms the above findings. CTA ABDOMEN AND PELVIS FINDINGS VASCULAR Aorta: Marked severity calcification of a caliber aorta without aneurysm, dissection, vasculitis or significant stenosis. Celiac: Patent without evidence of aneurysm, dissection, vasculitis or significant stenosis. SMA: Patent without evidence of aneurysm, dissection, vasculitis or significant stenosis. Renals: Both renal arteries are patent without evidence of aneurysm, dissection, vasculitis, fibromuscular dysplasia or significant stenosis. IMA: Patent without evidence of aneurysm, dissection, vasculitis or significant stenosis. Inflow: Marked severity calcification and atherosclerosis without evidence of aneurysm, dissection, vasculitis or significant stenosis. Veins: No obvious venous abnormality within the limitations of this arterial phase study. Review of the MIP images confirms the above findings. NON-VASCULAR Hepatobiliary: No focal liver abnormality is seen. Multiple small gallstones are seen within the lumen of an otherwise normal-appearing gallbladder. Pancreas: Unremarkable. No pancreatic  ductal dilatation or surrounding inflammatory changes. Spleen: A 5.0 cm x 2.6 cm x 5.0 cm area of heterogeneous parenchymal low attenuation is seen within the posteromedial aspect of the spleen. Adrenals/Urinary Tract: Adrenal glands are unremarkable. Kidneys are normal in size, without renal calculi or hydronephrosis. Bilateral simple renal cysts are seen. The largest measures approximately 5.1 cm x 4.1 cm and is seen within the left kidney. No additional follow-up for imaging is recommended. Bladder is unremarkable. Stomach/Bowel: Stomach is within normal limits. Appendix appears normal. No evidence of bowel wall thickening, distention, or inflammatory changes. Noninflamed diverticula are seen within the descending and sigmoid colon. Lymphatic: No abnormal abdominal or pelvic lymph nodes are identified. Reproductive: There is mild to moderate severity prostate gland enlargement. Other: No abdominal wall hernia or abnormality. No abdominopelvic ascites. Musculoskeletal:  Multilevel degenerative changes seen throughout the lumbar spine. Review of the MIP images confirms the above findings. IMPRESSION: 1. Mild amount of pulmonary embolism within a lower lobe branch of the right pulmonary artery. 2. Marked severity right upper lobe and mild right middle lobe infiltrate. 3. Cholelithiasis. 4. Colonic diverticulosis. 5. Findings within the posteromedial aspect of the spleen which may represent a splenic infarct of indeterminate age. 6. Mild to moderate severity prostatomegaly. Correlation with PSA levels is recommended. Aortic Atherosclerosis (ICD10-I70.0). Electronically Signed   By: Virgina Norfolk M.D.   On: 07/22/2021 21:56    EKG: personally reviewed my interpretation is normal sinus rhythm, nonspecific ST and T waves changes, RBBB and LAFB, present on EKG from 5/17.  ASSESSMENT & PLAN:  Assessment: Principal Problem:   Acute encephalopathy  Xavier Giles is a 81 y.o. with pertinent PMH of CAD s/p CABG  and multiple PCI stents, hypertension, hyperlipidemia, and recent admission for CAP who presented with altered mental status and admit for sepsis secondary to community acquired pneumonia on hospital day 0  Plan: #Sepsis secondary to CAP #Acute metabolic encephalopathy #Acute hypoxic respiratory failure Patient is presented with concerns for altered mental status for one day duration. He was noted to be febrile (Tmax 102.2), tachypneic and mildly tachycardic with imaging suggestive of progressive right upper and mid lobe pneumonia. Patient also noted to have new oxygen requirement to 2L O2 via nasal cannula.  CBC without leukocytosis and no lactic acidosis noted. However, did have elevated troponins, AST/ALT and PT/INR, concerning for end-organ involvement. Patient was started on broad spectrum antibiotics and fluid resuscitated. He is currently hemodynamically stable.  CT Head negative for acute intracranial abnormality. I suspect that his encephalopathy is in setting of sepsis; however, will evaluate for structural causes with MRI brain.  - Continue IV cefepime 2g every 8 hours - F/u blood cultures  - F/u MRI Brain wo contrast  - Delirium precautions  - Continue oxygen supplementation for goal SpO2 >92%  #RLL Pulmonary embolism, intermediate risk  Patient with ongoing pleuritic chest pain and encephalopathy with immobilization at home following discharge from hospital one week prior. CTA Chest with PE in lower lobe of right pulmonary artery. Troponins elevated to 143 > 151, however, this could be in setting of increased demand from sepsis as above. CTA Chest with RV/LV ratio >0.9, concerning for possible RH strain. He is hemodynamically stable at this time.  - Start therapeutic dose lovenox - F/u BNP, trend troponin - Transition to oral anticoagulation once mental status improves  - Cardiac monitoring    #Physical deconditioning Patient has been immobile and weak since discharge from the  hospital with poor oral intake.  - PT/OT eval  - Boost shakes tid between meals   #Hyperglycemia HgA1c of 6.9 in 3/22. He does not take medications for diabetic management. - SSI with meals - CBG monitoring - HbA1c  #CAD s/p CABG and multiple PCIs - Resume home IMDUR '60mg'$  daily in AM - Resume home brilinta '60mg'$  daily  - Resume home crestor '20mg'$  daily   #Hypertension - Resume home amlodipine '10mg'$  daily and benazepril '20mg'$  daily in AM - Resume home atenolol '50mg'$  daily in AM  #Incidental pulmonary nodules  Previously noted to have small 5-14m pulmonary nodules in lower lobes and recommended for follow up CT Chest in 6-12 months. CTA chest obtained today with 772mpartially calcified LUL nodule. - Recommend outpatient follow up   Best Practice: Diet: Cardiac diet IVF: Fluids: LR, Rate:  150 cc/hr x 20 hrs VTE:  Code: Full AB: Vancomycin, Cefepime Status: Inpatient with expected length of stay greater than 2 midnights. Anticipated Discharge Location:  Pending PT eval; Home w/HH vs SNF Barriers to Discharge: Medical stability, IV antibiotics, and New oxygen  Signature: Virgilia Quigg M. Lydiana Milley, D.O.  Internal Medicine Resident, PGY-1 [facility]  Pager: [phone] 10:33 PM, 07/22/2021   Please contact the on call pager after 5 pm and on weekends at [phone].

## 2021-07-22 NOTE — ED Notes (Signed)
Patient transported to CT 

## 2021-07-22 NOTE — Sepsis Progress Note (Signed)
eLink monitoring code sepsis.  

## 2021-07-22 NOTE — ED Notes (Signed)
After pain medication administration patient became visibly more comfortable, less restless, more alert, more cooperative. Family remains at bedside, VS stable at this time.

## 2021-07-22 NOTE — ED Notes (Signed)
Got patient into a gown on monitor did ekg shown to Dr Vanita Panda patient is resting with call bell in reach and family at bedside

## 2021-07-22 NOTE — ED Triage Notes (Addendum)
Pt BIB [facility] from home after family called for AMS. Pt d/c'ed a week ago for pneumonia. Family reports patient has declined over the past few days, speaking less and weaker, needing more help with ADLs. Family reports pt's feet are swollen.

## 2021-07-23 ENCOUNTER — Inpatient Hospital Stay (HOSPITAL_COMMUNITY): Payer: Medicare Other

## 2021-07-23 DIAGNOSIS — R778 Other specified abnormalities of plasma proteins: Secondary | ICD-10-CM | POA: Diagnosis not present

## 2021-07-23 DIAGNOSIS — G9340 Encephalopathy, unspecified: Secondary | ICD-10-CM

## 2021-07-23 DIAGNOSIS — J189 Pneumonia, unspecified organism: Secondary | ICD-10-CM

## 2021-07-23 DIAGNOSIS — R4182 Altered mental status, unspecified: Secondary | ICD-10-CM

## 2021-07-23 DIAGNOSIS — I6340 Cerebral infarction due to embolism of unspecified cerebral artery: Secondary | ICD-10-CM | POA: Insufficient documentation

## 2021-07-23 DIAGNOSIS — I2699 Other pulmonary embolism without acute cor pulmonale: Secondary | ICD-10-CM | POA: Diagnosis not present

## 2021-07-23 DIAGNOSIS — Y95 Nosocomial condition: Secondary | ICD-10-CM

## 2021-07-23 LAB — COMPREHENSIVE METABOLIC PANEL
ALT: 147 U/L — ABNORMAL HIGH (ref 0–44)
AST: 107 U/L — ABNORMAL HIGH (ref 15–41)
Albumin: 2.1 g/dL — ABNORMAL LOW (ref 3.5–5.0)
Alkaline Phosphatase: 87 U/L (ref 38–126)
Anion gap: 8 (ref 5–15)
BUN: 18 mg/dL (ref 8–23)
CO2: 23 mmol/L (ref 22–32)
Calcium: 8.2 mg/dL — ABNORMAL LOW (ref 8.9–10.3)
Chloride: 103 mmol/L (ref 98–111)
Creatinine, Ser: 1.02 mg/dL (ref 0.61–1.24)
GFR, Estimated: >60 mL/min (ref >60)
Glucose, Bld: 195 mg/dL — ABNORMAL HIGH (ref 70–99)
Potassium: 3.8 mmol/L (ref 3.5–5.1)
Sodium: 134 mmol/L — ABNORMAL LOW (ref 135–145)
Total Bilirubin: 1 mg/dL (ref 0.3–1.2)
Total Protein: 5 g/dL — ABNORMAL LOW (ref 6.5–8.1)

## 2021-07-23 LAB — TROPONIN I (HIGH SENSITIVITY): Troponin I (High Sensitivity): 134 ng/L (ref <18)

## 2021-07-23 LAB — URINE CULTURE: Culture: NO GROWTH

## 2021-07-23 LAB — CBG MONITORING, ED
Glucose-Capillary: 141 mg/dL — ABNORMAL HIGH (ref 70–99)
Glucose-Capillary: 145 mg/dL — ABNORMAL HIGH (ref 70–99)

## 2021-07-23 LAB — CBC
HCT: 37.9 % — ABNORMAL LOW (ref 39.0–52.0)
Hemoglobin: 12.4 g/dL — ABNORMAL LOW (ref 13.0–17.0)
MCH: 28.4 pg (ref 26.0–34.0)
MCHC: 32.7 g/dL (ref 30.0–36.0)
MCV: 86.7 fL (ref 80.0–100.0)
Platelets: 259 10*3/uL (ref 150–400)
RBC: 4.37 MIL/uL (ref 4.22–5.81)
RDW: 13.7 % (ref 11.5–15.5)
WBC: 9.9 10*3/uL (ref 4.0–10.5)
nRBC: 0 % (ref 0.0–0.2)

## 2021-07-23 LAB — GLUCOSE, CAPILLARY
Glucose-Capillary: 172 mg/dL — ABNORMAL HIGH (ref 70–99)
Glucose-Capillary: 126 mg/dL — ABNORMAL HIGH (ref 70–99)

## 2021-07-23 LAB — HEMOGLOBIN A1C
Hgb A1c MFr Bld: 7.7 % — ABNORMAL HIGH (ref 4.8–5.6)
Mean Plasma Glucose: 174.29 mg/dL

## 2021-07-23 LAB — BRAIN NATRIURETIC PEPTIDE: B Natriuretic Peptide: 871.1 pg/mL — ABNORMAL HIGH (ref 0.0–100.0)

## 2021-07-23 LAB — PSA: Prostatic Specific Antigen: 3.1 ng/mL (ref 0.00–4.00)

## 2021-07-23 IMAGING — US US ABDOMEN LIMITED
1 series · 14 of 25 positions shown · non-contrast
Comparison: CT yesterday

CLINICAL DATA: Elevated liver function tests.

EXAM:
ULTRASOUND ABDOMEN LIMITED RIGHT UPPER QUADRANT

[Series 1: us abdomen limited ruq (liver/gb) · 14 of 57 slices shown]
[im 1/57]
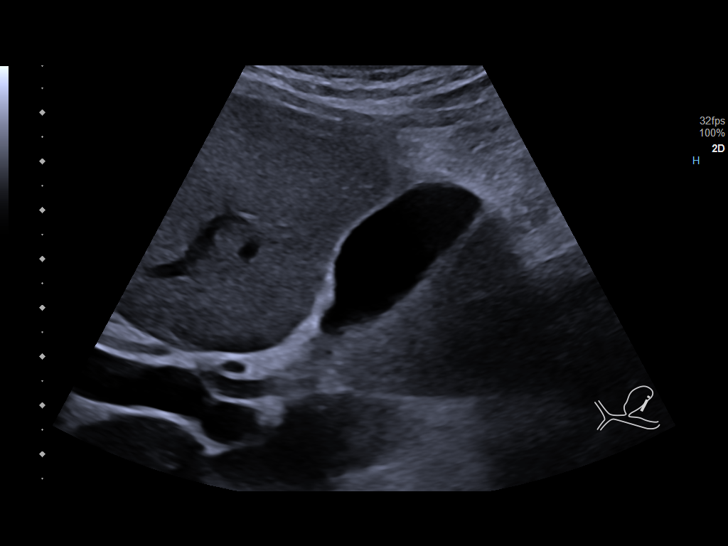
[im 5/57]
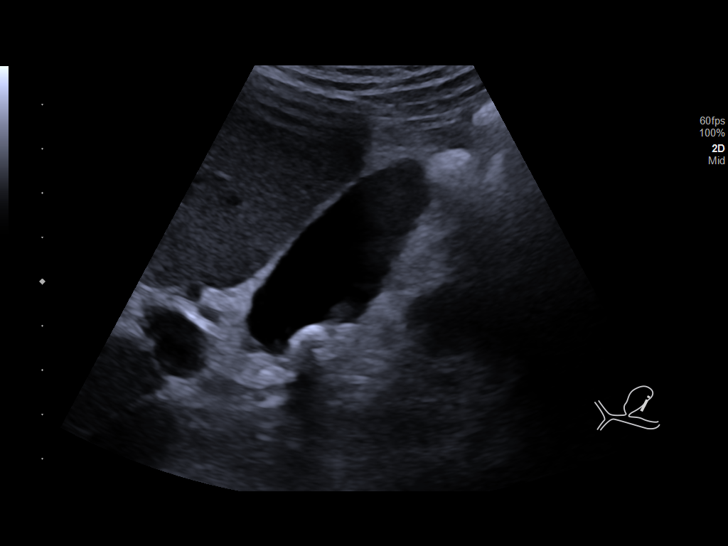
[im 10/57]
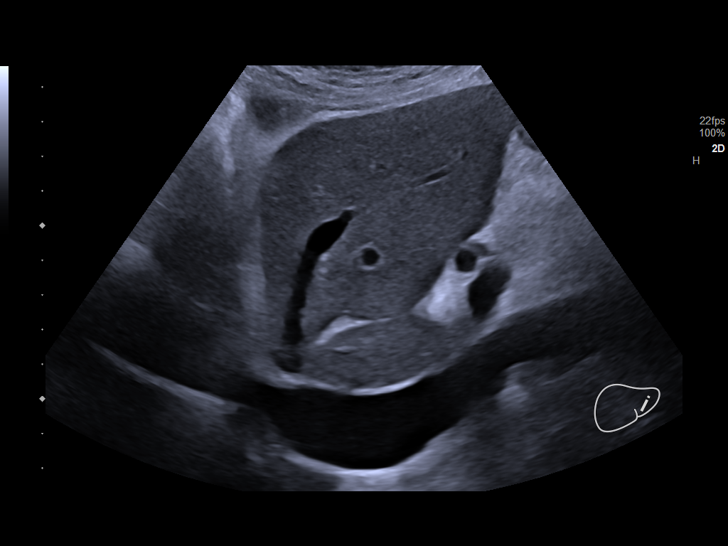
[im 15/57]
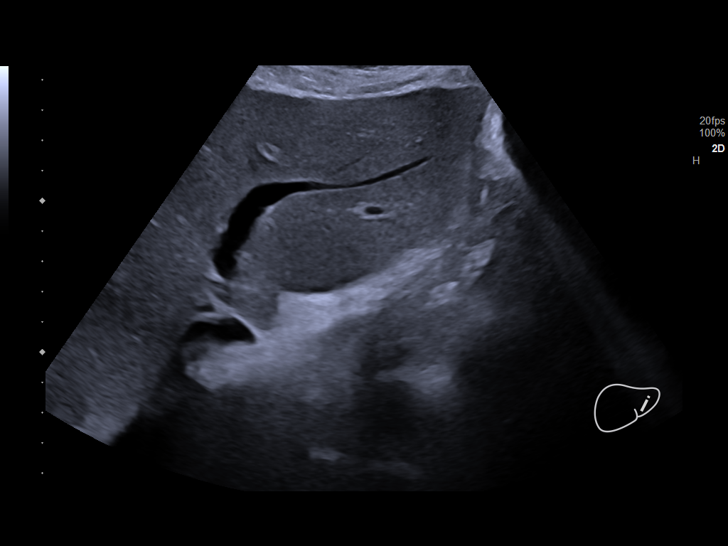
[im 19/57]
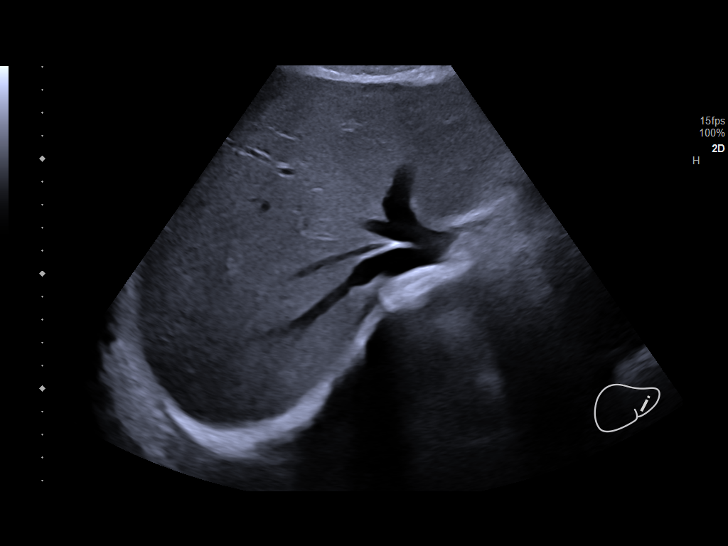
[im 22/57]
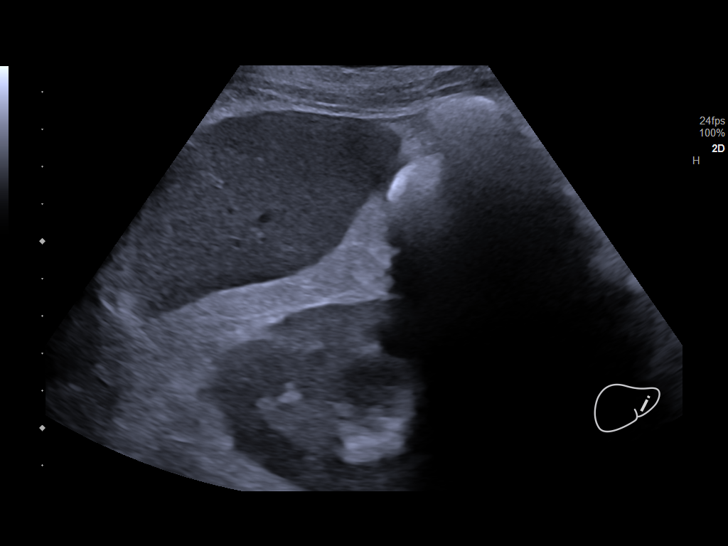
[im 26/57]
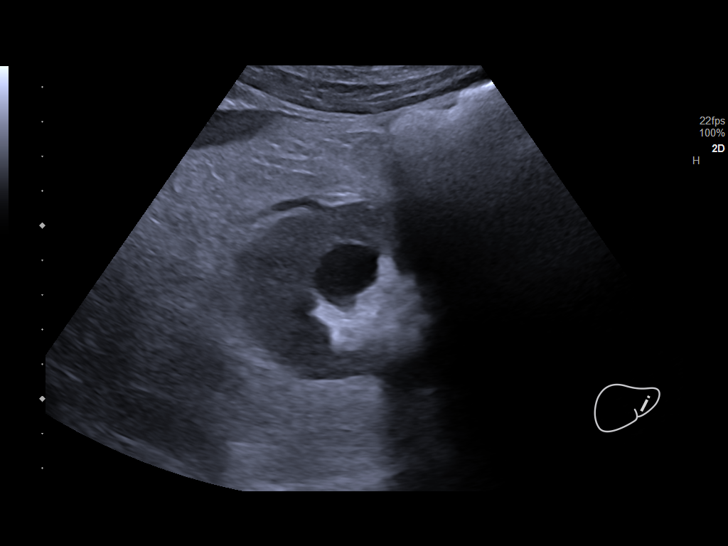
[im 31/57]
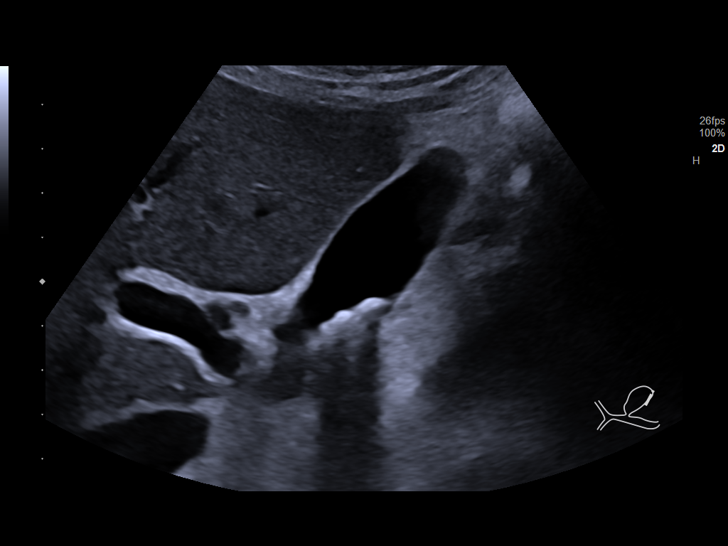
[im 36/57]
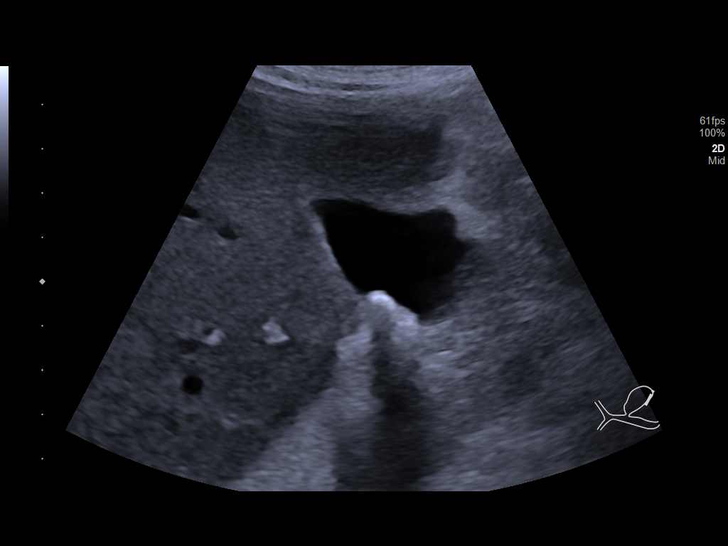
[im 38/57]
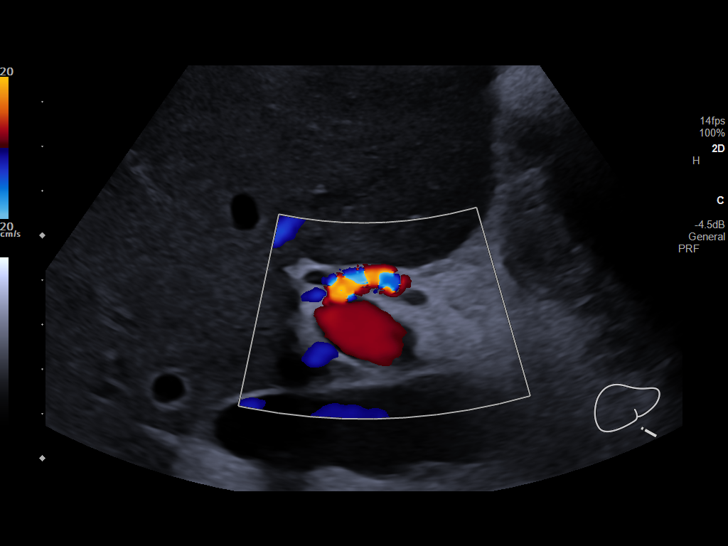
[im 43/57]
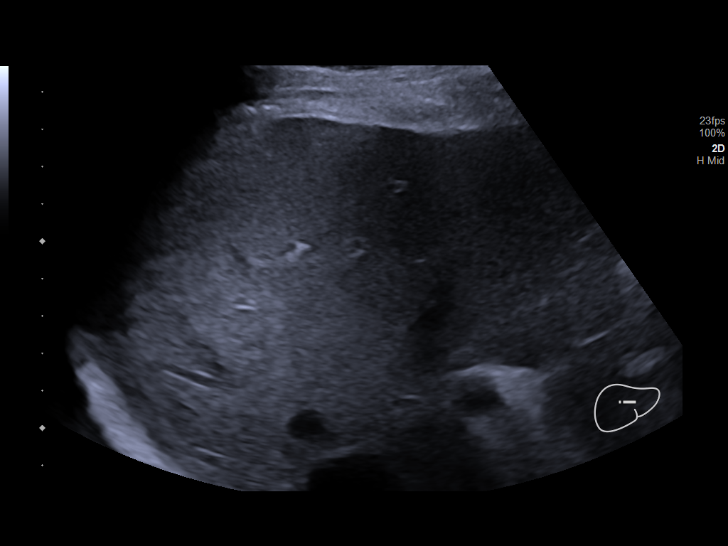
[im 47/57]
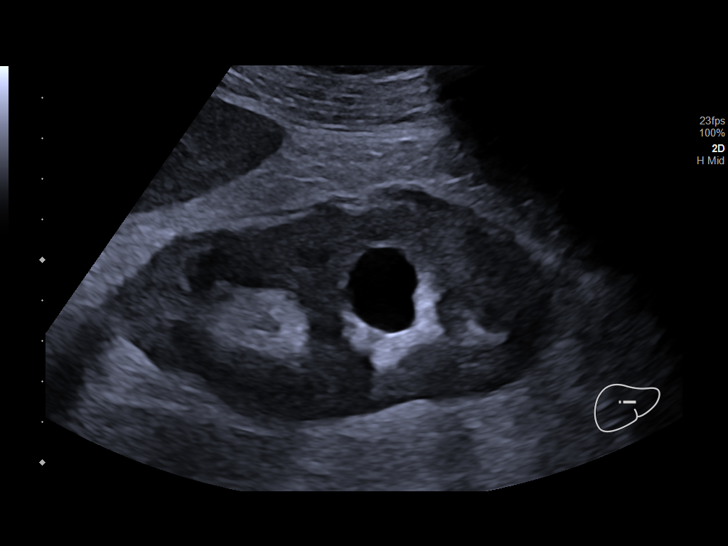
[im 52/57]
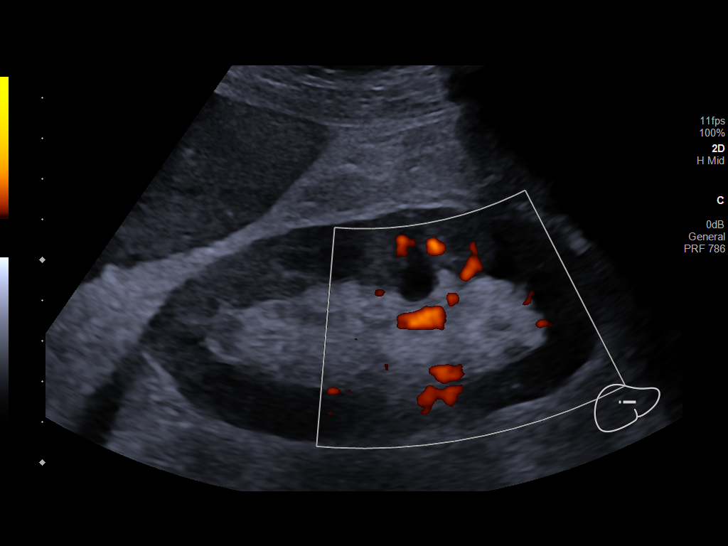
[im 57/57]
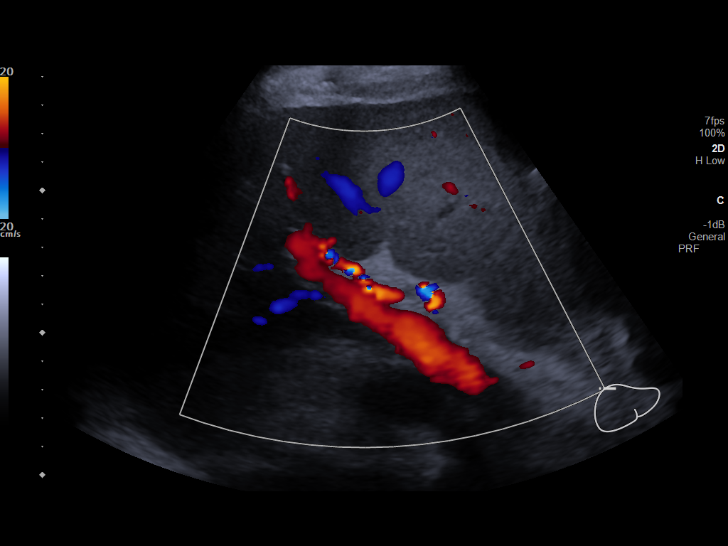

[14 of 25 positions shown; findings below may reference images not displayed]

FINDINGS: Gallbladder:

Multiple gallstones present within the gallbladder, the largest
measuring 7 mm in size. No wall thickening or surrounding fluid. No
Murphy sign.

Common bile duct:

Diameter: 2.8 mm, normal.

Liver:

No focal lesion identified. Within normal limits in parenchymal
echogenicity. Portal vein is patent on color Doppler imaging with
normal direction of blood flow towards the liver.

Other: 2 cm right renal cyst.  No follow-up recommended.
IMPRESSION: Cholelithiasis without sonographic evidence of cholecystitis or
obstruction. Several small stones, the largest measuring 7 mm.

Normal appearance of the liver itself.

## 2021-07-23 IMAGING — MR MR HEAD W/O CM
9 of 10 series · 36 of 48 positions shown · non-contrast
Comparison: No prior MRI, correlation is made with CT head
[DATE]

CLINICAL DATA: Mental status change, unknown cause

EXAM:
MRI HEAD WITHOUT CONTRAST
TECHNIQUE: Multiplanar, multiecho pulse sequences of the brain and surrounding
structures were obtained without intravenous contrast.

[Series 3: DWI · axial · 3.0mm · 1.09mm/px · z∈[-74,+86]mm · 9 of 112 slices shown (1 of 4)]
[im 1/112]
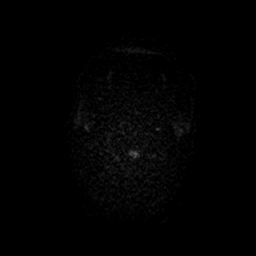
[im 21/112]
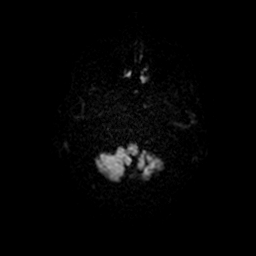
[im 31/112]
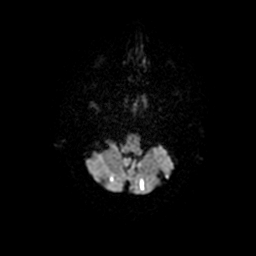
[im 51/112]
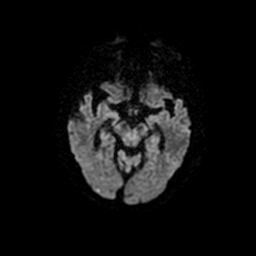
[im 61/112]
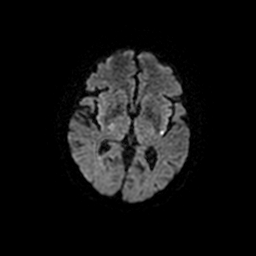
[im 81/112]
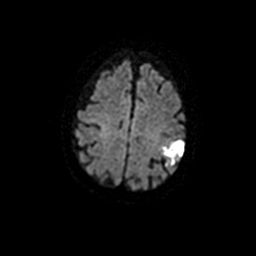
[im 91/112]
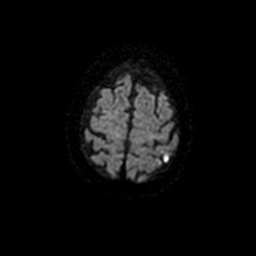
[im 101/112]
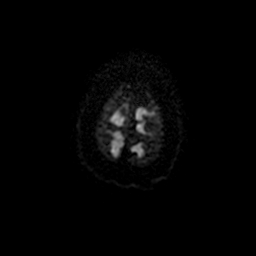
[im 112/112]
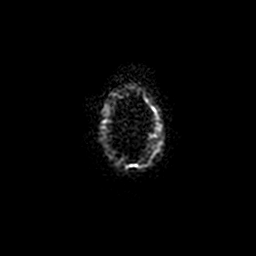

[Series 4: DWI · coronal · 5.0mm · 1.09mm/px · 8 of 82 slices shown (2 of 4)]
[im 1/82]
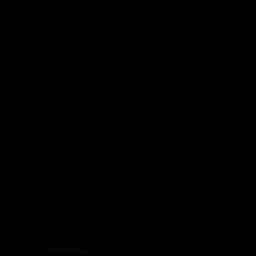
[im 12/82]
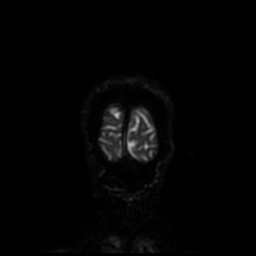
[im 24/82]
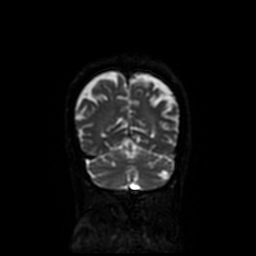
[im 35/82]
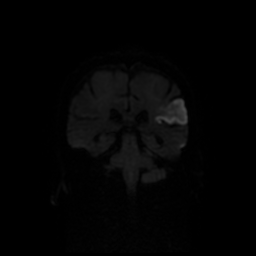
[im 47/82]
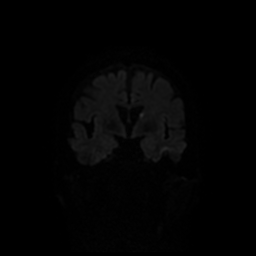
[im 58/82]
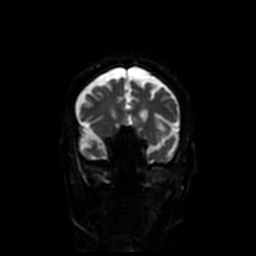
[im 70/82]
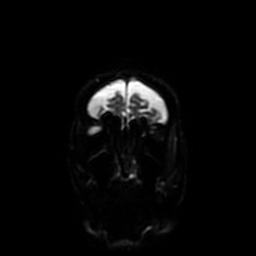
[im 82/82]
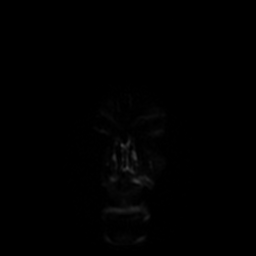

[Series 5: T1 · sagittal · 5.0mm · 0.47mm/px · 2 of 23 slices shown (1 of 2)]
[im 1/23]
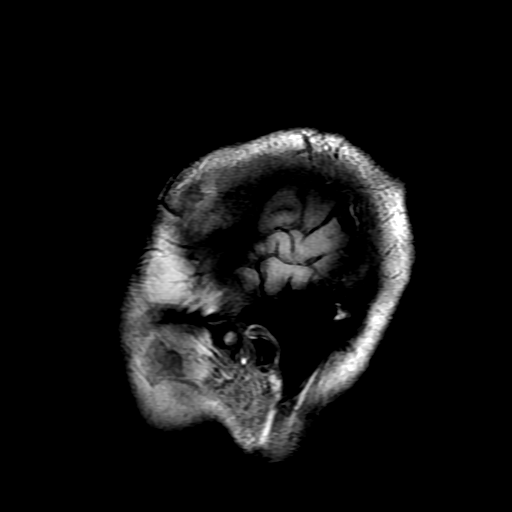
[im 23/23]
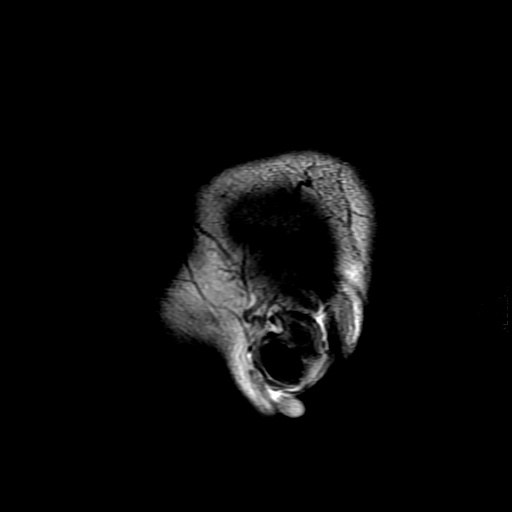

[Series 6: T2 · axial · 5.0mm · 0.43mm/px · z∈[-79,+61]mm · 2 of 25 slices shown (1 of 2)]
[im 1/25]
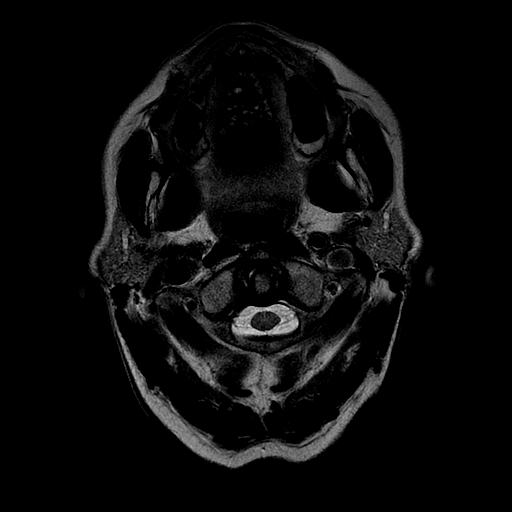
[im 25/25]
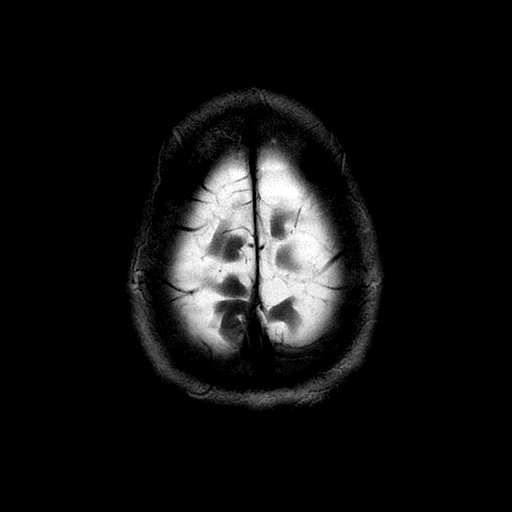

[Series 7: FLAIR · axial · 3.0mm · 0.43mm/px · z∈[-79,+61]mm · 2 of 25 slices shown]
[im 1/25]
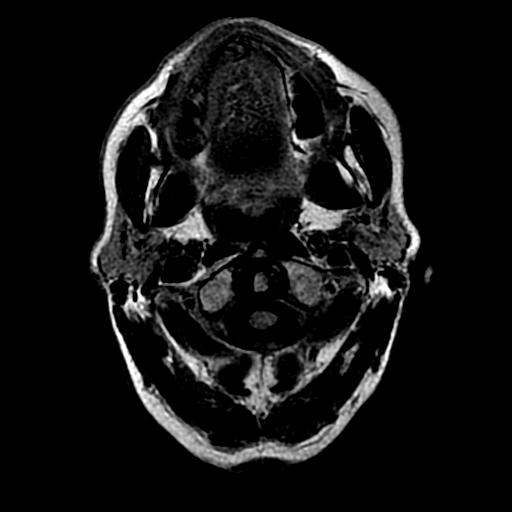
[im 25/25]
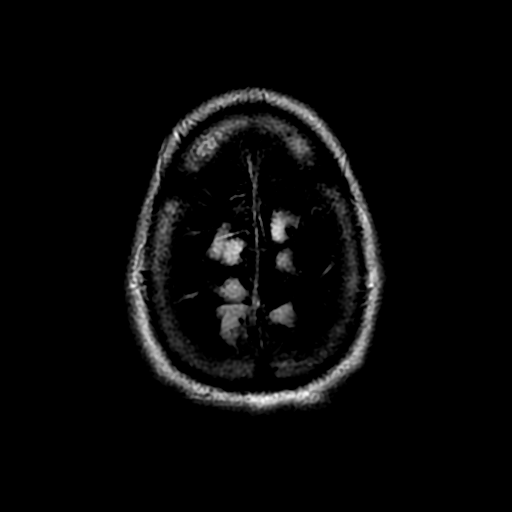

[Series 9: T1 · axial · 3.0mm · 0.43mm/px · z∈[-80,-63]mm · 2 of 100 slices shown (2 of 2)]
[im 1/100]
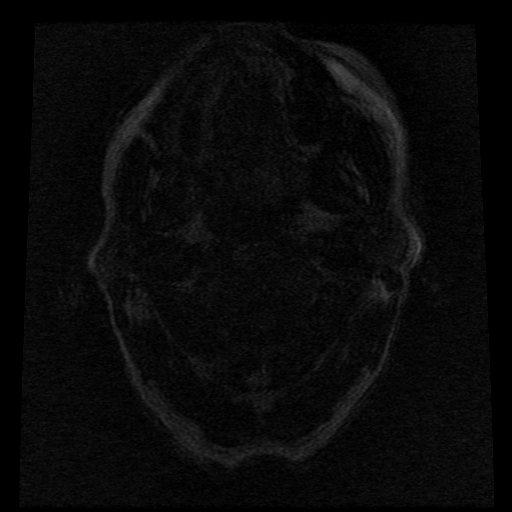
[im 13/100]
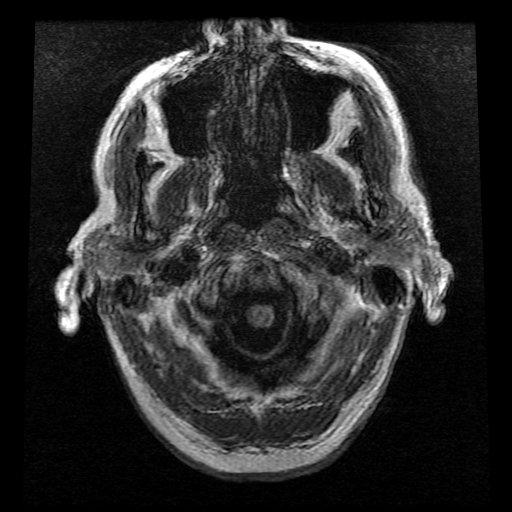

[Series 10: T2 · coronal · 5.0mm · 0.43mm/px · 2 of 24 slices shown (2 of 2)]
[im 1/24]
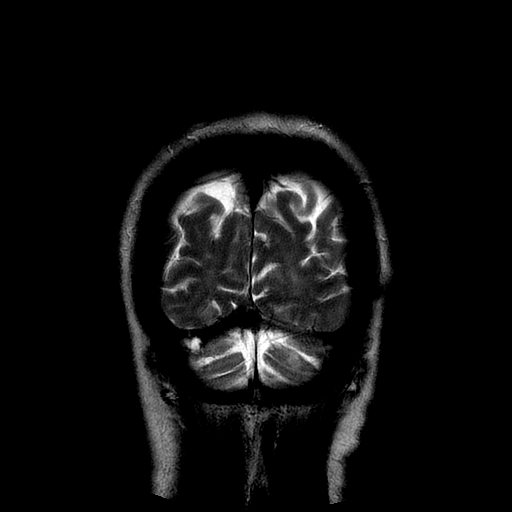
[im 24/24]
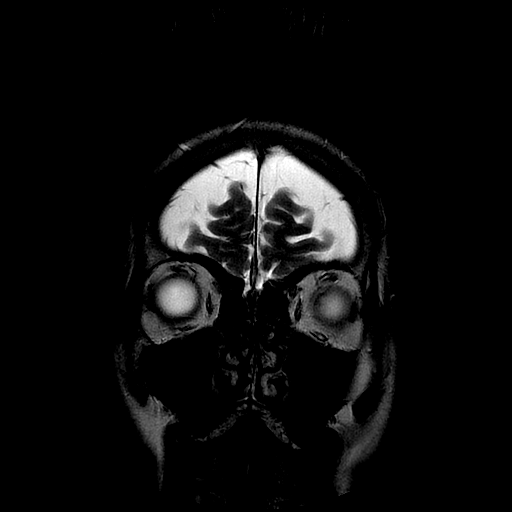

[Series 300: DWI · axial · 3.0mm · 1.09mm/px · z∈[-74,+86]mm · 5 of 56 slices shown (3 of 4)]
[im 1/56]
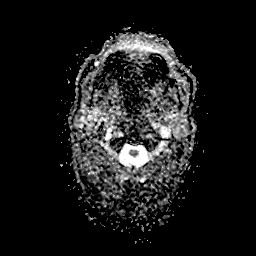
[im 14/56]
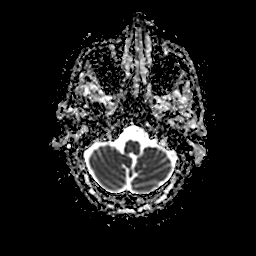
[im 28/56]
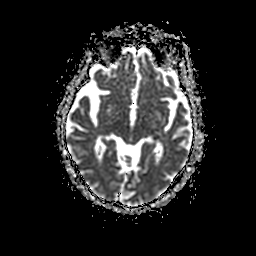
[im 42/56]
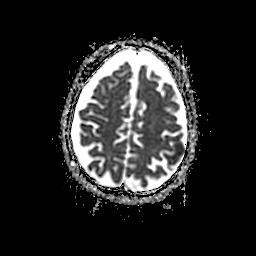
[im 56/56]
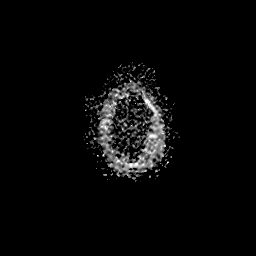

[Series 400: DWI · coronal · 5.0mm · 1.09mm/px · 4 of 41 slices shown (4 of 4)]
[im 1/41]
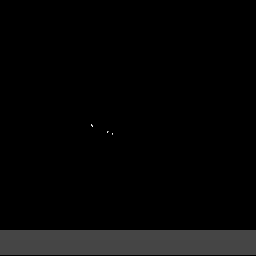
[im 14/41]
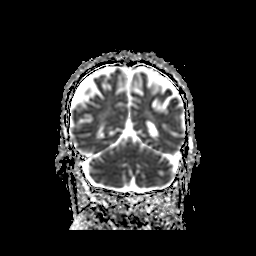
[im 27/41]
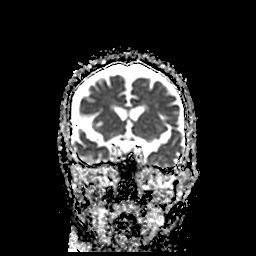
[im 41/41]
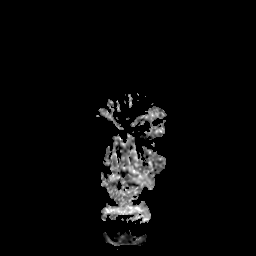

[36 of 48 positions shown; findings below may reference images not displayed]

FINDINGS: Brain: Moderate size area of restricted diffusion with ADC correlate
in the posterior right frontal lobe and posterior insula, which
extends from white matter to cortex, measuring approximately 2.8 x
3.7 x 2.8 cm (AP x TR x CC) (series 3, image 35 and series 4, image
18). Additional smaller foci of restricted diffusion are seen in the
left-greater-than-right cerebellar hemispheres and vermis (series 3,
images 12-22), bilateral occipital lobes (series 3, image 24), left
caudate head (series 3, image 35), bilateral caudate (series 3,
and bilateral posterior frontal lobes (series 3, images 44 and 47).

No acute hemorrhage, mass, mass effect, or midline shift. No
hydrocephalus or extra-axial collection. Scattered T2 hyperintense
signal in the periventricular white matter, likely the sequela of
mild chronic small vessel ischemic disease. Degree of cerebral
volume loss is commensurate with increased size of extra-axial
spaces and is within normal limits for age. Remote infarcts in the
bilateral cerebellar hemispheres.

Vascular: Normal flow voids.

Skull and upper cervical spine: Normal marrow signal.

Sinuses/Orbits: No acute finding. Status post bilateral lens
replacements.

Other: Fluid in the right mastoid air cells.
IMPRESSION: Moderate area of acute infarct involving the posterior right frontal
lobe and posterior insula, with additional smaller areas of
restricted diffusion in the bilateral frontal, parietal, and
occipital lobes, as well as the cerebellar hemispheres and vermis.

These results will be called to the ordering clinician or
representative by the Radiologist Assistant, and communication
documented in the PACS or [REDACTED].

## 2021-07-23 MED ORDER — RIVAROXABAN 10 MG PO TABS: 20.0000 mg | ORAL_TABLET | Freq: Every day | ORAL | Status: DC

## 2021-07-23 MED ORDER — RIVAROXABAN 15 MG PO TABS: 15.0000 mg | ORAL_TABLET | Freq: Two times a day (BID) | ORAL | Status: DC

## 2021-07-23 MED ORDER — HALOPERIDOL 0.5 MG PO TABS
0.5000 mg | ORAL_TABLET | Freq: Once | ORAL | Status: AC
  Filled 2021-07-23: qty 1

## 2021-07-23 MED ORDER — HALOPERIDOL LACTATE 5 MG/ML IJ SOLN
0.5000 mg | Freq: Once | INTRAMUSCULAR | Status: AC
  Administered 2021-07-23: 0.5 mg via INTRAMUSCULAR
  Filled 2021-07-23: qty 1

## 2021-07-23 MED ORDER — ATENOLOL 50 MG PO TABS: 50.0000 mg | ORAL_TABLET | Freq: Every day | ORAL | Status: DC

## 2021-07-23 MED ORDER — PIPERACILLIN-TAZOBACTAM 3.375 G IVPB
3.3750 g | Freq: Three times a day (TID) | INTRAVENOUS | Status: DC
Start: 2021-07-23 — End: 2021-07-26
  Administered 2021-07-23 – 2021-07-26 (×9): 3.375 g via INTRAVENOUS
  Filled 2021-07-23 (×9): qty 50

## 2021-07-23 MED ORDER — RIVAROXABAN 15 MG PO TABS
15.0000 mg | ORAL_TABLET | Freq: Two times a day (BID) | ORAL | Status: DC
  Filled 2021-07-23: qty 1

## 2021-07-23 MED ORDER — HEPARIN (PORCINE) 25000 UT/250ML-% IV SOLN
1300.0000 [IU]/h | INTRAVENOUS | Status: DC
  Filled 2021-07-23: qty 250

## 2021-07-23 NOTE — ED Notes (Signed)
Notified Dr. Cathleen Corti that around 11 am patient was found with emesis all over himself. This was after SLP came to evaluate and work with patient. Was initially concerned patient may have aspirated and was minimally responsive but patient then became very agitated as MD arrived to bedside. MD ordering haldol to help with nausea and agitation.

## 2021-07-23 NOTE — Progress Notes (Addendum)
Received epic chat message from RN due to vomiting event and concern for aspiration.  She states that this occurred after speech evaluated the patient and after they recommended dysphagia 3 diet with crushed medications.  Patient also seemed more agitated and only responsive to sternal voice and mild sternal rubbing.  Daughter noted that patient seemed more out of it as well.  Patient was evaluated at bedside with with daughter also present. Patient was noted to be satting 97% on 2 L.  He was agitated and continued to ask his daughter for assistance with dying.  He did not follow any of my commands or answer any of my questions today.  Exam otherwise unchanged from this morning.  Plan: The patient was originally transitioned to [place] after speech eval, however I do not feel that the patient would be able to consistently take his oral anticoagulant at this time given his vomiting and inability to talk to me or follow my commands.  We will DC xarelto and transition to IV heparin at this time for treatment of his pulmonary embolism.  He is already on Zosyn for HCAP and concern for aspiration PNA.  We will give 0.5 mg IM Haldol x 1 for his agitation.  Daughter endorsed understanding and is in agreement with this plan.

## 2021-07-23 NOTE — Evaluation (Signed)
Clinical/Bedside Swallow Evaluation Patient Details  Name: Xavier Giles MRN: [id number] Date of Birth: 06/12/1940  Today's Date: 07/23/2021 Time:   SLP Stop Time (ACUTE ONLY): 1001    Past Medical History:  Past Medical History:  Diagnosis Date   Asthma    CAD (coronary artery disease) of bypass graft 2003   (now known CTO of SVG-rPDA & SVG-D1 & 3 stents in SeqSVg-Om1-OM2 (1 in prox leg, overlapping DES-BMS from OM1-OM2 into OM2 - s/p PTCA for ISR).; LIMA-LAD patent    CAD S/P PTCA-PCI of SeqSVG-OM1-OM2 2003; 01/2012; 05/2013   a) '03 SeqSVG-OM1-OM2,  Zeta BMS 3.0x8 Seq limb into OM2), & PTCA of SVg-D1 anastomosis; b) 2013: Distal Limb of SeqSVG-OM1-OM2 from OM1-2 overlap BMS ->Xience DES 2.75 x 15 - 31m;; c) 05/2013 Proxlimb SVG--OM1-OM2 -> Resolute DES 3 x 26 - 3.240m Angiosculpt PTCA of ISR in Seq Limb overlaping stents (3.25 mm)   CAD, multiple vessel - Native CAD. 1998; 2015   a) Referred for CABGx5 ; by Cath 05/2013: dLM 60%, Ost LAD 70&. Prox -mid LAD extensive 70%( to-fro flow); D1 80%; OM1 & OM2 (CTO), follow-on LCx beyond AVG 90+% with small OM3/OM4; RCA TO (R-R collaterals);   Carotid bruit present 01/2012   a. 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 Also noted CTO of SVG-Diag; b) CRESCENDO ANGINA: 05/2013 - prox limb SVG-OM1-OM2 DES PCI & PTCA of 95% ISR in distal Limb overlapping DES-BMS; Myoview 08/25/13 - small non-T-mural Inf scar, No Ischemia   S/P CABG x 5 1998   LIMA-LAD, SeqSVG-OM1-OM2; SVG-rPDA, SVG-D1    Past  Surgical History:  Past Surgical History:  Procedure Laterality Date   CATARACT EXTRACTION Bilateral    Dr. EpDolores Lory CATARACT EXTRACTION W/ INTRAOCULAR LENS  IMPLANT, BILATERAL Bilateral    CORONARY ANGIOPLASTY WITH STENT PLACEMENT  2003   DR LITTLE: BMS 3.0 mm x 8 mm stent to Seq limb anastomosis of SeqSVG-OM1-OM2 into OM2; & cutting balloon SVG-Diag   CORONARY ANGIOPLASTY WITH STENT PLACEMENT  06/28/2013   Procedure: CORONARY ANGIOPLASTY WITH STENT PLACEMENT ;  Surgeon: ThTroy SineMD;  [facility];  Service: Cardiovascular: PCI to SVG-CxOM1-OM2 99% prox lesion (Resolute DES 3.0 x 26 - 3.4 mm) & PTCA of  95% ISR in sequential limb (3.0 mm Angiosculpt)    CORONARY ARTERY BYPASS GRAFT  1998   SVG-diagonal, DVG-OM, DVG-PDA, LIMA-LAD   CORONARY STENT INTERVENTION  01/13/2012     Procedure: CORONARY STENT INTERVENTION ;  Surgeon: JoLorretta HarpMD;  [facility];  Service: Cardiovascular: PCI Seq limb of Seq SVG-OM1-OM2 90% -> 0% - Xience Xpedition DES 2.75 MM x15 MM (3 MM) overlaps prox edge of BMS into OM2   ESOPHAGOGASTRODUODENOSCOPY N/A 06/29/2013   Procedure: ESOPHAGOGASTRODUODENOSCOPY (EGD);  Surgeon: PaBeryle BeamsMD;  [facility];  Service: Endoscopy;  Laterality: N/A;   EYE SURGERY Bilateral    Cat Sx - Dr. EpDolores Lory LEFT HEART CATH AND CORS/GRAFTS  ANGIOGRAPHY  2003   ABN CARDIOLITE:  100% SVG-PDA, patent LIMA; 80% anastomotic lesion SVG-Diag; 99% anastomotic lesion of Seq limb SVG-OM1-OM2; Native LM OK - 50% & 80% prox LAD, 80% D1; Native OM1 & OM2 TO; Native RCA CTO (R_R collaterals). => PCI of SVG-OM2 & PTCA of SVG-Diag   LEFT HEART CATHETERIZATION WITH CORONARY/GRAFT ANGIOGRAM N/A 01/13/2012   Procedure: LEFT HEART CATHETERIZATION WITH Beatrix Fetters;  Surgeon: Lorretta Harp, MD;  [facility];  Service: Cardiovascular: SVG-D1 CTO (new), knosn SVG-RCA CTO.  Native mRCA CTO (R-R collaterals); 50% distal LM, 70-80% prox LAD and 90%  mid.  LIMA-LAD patent w/  70% post anastomosis. OM1 &OM2 CTO. Seq limb of SeqSVG-OM1-OM2 90% @ OM1 to BMS into OM2.   LEFT HEART CATHETERIZATION WITH CORONARY/GRAFT ANGIOGRAM N/A 06/28/2013   Procedure: LEFT HEART CATHETERIZATION WITH Beatrix Fetters;  Surgeon: Troy Sine, MD;  [facility];  Service: Cardiovascular: Cres Angina: a) 60% dLM; 70% Ostial LAD; 90% Cx - no OMs seen; 100% RCA;; SVG-D1 & SVG--dRCA occluded; Patent LIMA-LAD with dLAD ~70-80%;; SeqSVG-OM1-OM2: 95% proximal limb, 50% mid & Diffuse ISR in overlapping DES/BMS in Seq Limb OM-OM2   NM MYOVIEW LTD  07/2013   Test looked good!! Evidence of possible old MI / scar, but nothing to suggest ischemia. Not Gated b/c ectopy .   NM MYOVIEW LTD  06/2017   Intermediate risk because of reduced EF but stable.  Stable fixed inferior defect consistent with either diaphragmatic attenuation or inferior infarct.  Septal hypokinesis/dyskinesis due to LBBB and prior CABG.  EF is 46%.  Stable compared to 2015 following PCI   TONSILLECTOMY     HPI:  Mr. 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 Pt d/c'ed a week ago for pneumonia. Admitted now with family reports patient declined over the past few days, speaking less and weaker, needing more help with ADLs, feet are swollen. CT no acute abnormality, remote bil cerebellar infarcts.Chest CT found PE, right upperl obe and right middle lobe infiltrate.    Assessment / Plan / Recommendation  Clinical Impression  Pt seen for abbreviated evaluation given current confusion, decreased attention and distractibilty. Speech with intermittent phonemic paraphagias stating he "wanted to die". Therapist able to redirect pt to drinking via straw which he eagerly accepted for continous swallows without indicaitons of  airway intrusion. No focal oral motor weakness, lower dentures and lacking upper dentition. Exhlation appreciated post swallow indicative of adequate swallow/respiratory reciprocity. Observed with pieces of french toast with reduced manipulation, mastication and hasty transit and suspected incomplete mastication. He will need full assist, supervision and feeding assist for meals only when calm and able to focus on task. Downgrading texture to Dys 3, continue thin and recommend crush meds and full supervision. ST will follow for appropriate mastication and swallow integrity without overt s/sx aspiration. SLP Visit Diagnosis: Dysphagia, unspecified (R13.10)    Aspiration Risk  Mild aspiration risk    Diet Recommendation Dysphagia 3 (Mech soft);Thin liquid   Liquid Administration via: Straw;Cup Medication Administration: Crushed with puree Supervision: Staff to assist with self feeding;Full supervision/cueing for compensatory strategies Compensations: Slow rate;Small sips/bites;Minimize environmental distractions;Lingual sweep for clearance of pocketing Postural Changes: Seated upright at 90 degrees    Other  Recommendations Oral Care Recommendations: Oral care BID  Recommendations for follow up therapy are one component of a multi-disciplinary discharge planning process, led by the attending physician.  Recommendations may be updated based on patient status, additional functional criteria and insurance authorization.  Follow up Recommendations  (TBD)      Assistance Recommended at Discharge Frequent or constant Supervision/Assistance  Functional Status Assessment Patient has had a recent decline in their functional status and demonstrates the ability to make significant improvements in function in a reasonable and predictable amount of time.  Frequency and Duration min 2x/week  2 weeks       Prognosis Prognosis for Safe Diet Advancement:  (fair-good) Barriers to Reach Goals: Cognitive  deficits      Swallow Study   General Date of Onset: 07/22/21 HPI: Mr. 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 Pt d/c'ed a week ago for pneumonia. Admitted now with family reports patient declined over the past few days, speaking less and weaker, needing more help with ADLs, feet are swollen. CT no acute abnormality, remote bil cerebellar infarcts.Chest CT found PE, right upperl obe and right middle lobe infiltrate. Type of Study: Bedside Swallow Evaluation Previous Swallow Assessment:  (none) Diet Prior to this Study: Regular;Thin liquids Temperature Spikes Noted:  (not documented today- febrile last night) Respiratory Status: Room air History of Recent Intubation: No Behavior/Cognition: Alert;Confused;Impulsive;Distractible;Requires cueing Oral Cavity Assessment: Within Functional Limits Oral Care Completed by SLP: No Oral Cavity - Dentition: Dentures, bottom (no upper dentition) Vision: Functional for self-feeding Self-Feeding Abilities: Needs assist Patient Positioning: Upright in bed Baseline Vocal Quality: Normal Volitional Cough: Cognitively unable to elicit Volitional Swallow: Unable to elicit    Oral/Motor/Sensory Function Overall Oral Motor/Sensory Function:  (pt unable to complete - no focal weakness)   Ice Chips Ice chips: Not tested   Thin Liquid Thin Liquid: Within functional limits Presentation: Straw    Nectar Thick Nectar Thick Liquid: Not tested   Honey Thick Honey Thick Liquid: Not tested   Puree Puree: Not tested   Solid     Solid: Impaired Oral Phase Impairments: Reduced lingual movement/coordination Oral Phase Functional Implications:  (none) Pharyngeal Phase Impairments:  (none)      Maribelle Hopple, Orbie Pyo 07/23/2021,10:56 AM

## 2021-07-23 NOTE — Progress Notes (Signed)
   Subjective: The patient was seen at bedside during rounds this morning with two of his family members present. The patient states that he cannot hear well and is also asking [place] to assist him in letting him die. Unable to answer orientation questions today.   Objective:  Vital signs in last 24 hours: Vitals:   07/23/21 0545 07/23/21 0600 07/23/21 0615 07/23/21 0715  BP: 138/66 138/67 135/72 (!) 145/68  Pulse: 71 65 76 69  Resp: '18 19 19 17  '$ Temp:      TempSrc:      SpO2: 98% 96% 98% 97%  Weight:      Height:       General appearance: alert and in moderate distress, encephalopathic Resp: normal work of breathing on RA. There are rhonchi bilaterally and anteriorly Cardio: regular rate and rhythm, no m/r/g GI: bowel sounds normal and soft, non-tender, non-distended Extremities: No peripheral edema Skin: warm and dry Neuro: Unable to answer orientation questions Psych: Agitated  Assessment/Plan:  Principal Problem:   Acute encephalopathy  #Sepsis 2/2 HCAP #Acute metabolic encephalopathy #Acute hypoxic respiratory failure Recently hospitalized here for CAP and discharged on Augmentin and doxycycline regimens.  He presented here several days later with AMS in the setting of worsening right upper and middle lobe pneumonia, overall concerning for sepsis 2/2 hospital-acquired pneumonia.  At this time, he is hemodynamically stable.  He was transitioned to zosyn today to cover for anaerobes and Pseudomonas.   - Continue zosyn per pharmacy, appreciate their assistance - F/u blood cultures  - F/u MRI Brain wo contrast to eval for any structural abnormalities - Delirium precautions  - Continue oxygen supplementation for goal SpO2 >92%   #RLL Pulmonary embolism, intermediate risk  Patient with ongoing pleuritic chest pain and encephalopathy with immobilization at home following discharge from hospital one week prior. CTA Chest with PE in lower lobe of right pulmonary artery. SLP has  evaluated the patient today; meds can be administered in crushed form, so will transition from lovenox to [place] - Transition to [place] today - Cardiac monitoring     #Physical deconditioning Patient has been immobile and weak since discharge from the hospital with poor oral intake.  - PT/OT eval  - Boost shakes tid between meals    #Hyperglycemia HgA1c of 6.9 in 3/22. He does not take medications for diabetic management. - SSI with meals - CBG monitoring - HbA1c   #CAD s/p CABG and multiple PCIs - Continue home brilinta '60mg'$  daily  - Continue home crestor '20mg'$  daily    #Hypertension Systolics ranging from 563S-937D mostly, most recently in the 120s. - Resumed home atenolol '50mg'$  daily   #Incidental pulmonary nodules  Previously noted to have small 5-29m pulmonary nodules in lower lobes and recommended for follow up CT Chest in 6-12 months. CTA chest obtained during this admission with 715mpartially calcified LUL nodule. - Recommend outpatient follow up   Prior to Admission Living Arrangement: Home, living by himself Barriers to Discharge: Medical stability Dispo: Anticipated discharge pending medical stability  BoOrvis BrillMD 07/23/2021, 7:31 AM Pager: [phone] 5pm on weekdays and 1pm on weekends: On Call pager [phone]

## 2021-07-23 NOTE — Progress Notes (Signed)
EEG complete - results pending 

## 2021-07-23 NOTE — Consult Note (Signed)
Neurology Consultation Reason for Consult: AMS, transient RUE weakness Requesting Physician: Bobby Rumpf   CC: AMS  History is obtained from: Chart review and other providers   HPI: Xavier Giles is a 81 y.o. 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  He was initially started on Levaquin and then transitioned to Keflex.  Although he had improved during hospitalist today, per family he had a gradual decline on discharge home.  There was some concern for transient right upper extremity weakness and he was having intermittent confusion as well, for which neurology was consulted  LKW: Several days prior to admission tPA given?: No, out of the window Premorbid modified rankin scale: 0-2  NFA:[id number] to obtain due to altered mental status.   Past Medical History:  Diagnosis Date   Asthma    CAD (coronary artery disease) of bypass graft 2003   (now known CTO of SVG-rPDA & SVG-D1 & 3 stents in SeqSVg-Om1-OM2 (1 in prox leg, overlapping DES-BMS from OM1-OM2 into OM2 - s/p PTCA for ISR).; LIMA-LAD patent    CAD S/P PTCA-PCI of SeqSVG-OM1-OM2 2003; 01/2012; 05/2013   a) '03 SeqSVG-OM1-OM2,  Zeta BMS 3.0x8 Seq limb into OM2), & PTCA of SVg-D1 anastomosis; b) 2013: Distal Limb of SeqSVG-OM1-OM2 from OM1-2 overlap BMS ->Xience DES 2.75 x 15 - 32m;; c) 05/2013 Proxlimb SVG--OM1-OM2 -> Resolute DES 3 x 26 - 3.246m Angiosculpt PTCA of ISR in Seq Limb  overlaping stents (3.25 mm)   CAD, multiple vessel - Native CAD. 1998; 2015   a) Referred for CABGx5 ; by Cath 05/2013: dLM 60%, Ost LAD 70&. Prox -mid LAD extensive 70%( to-fro flow); D1 80%; OM1 & OM2 (CTO), follow-on LCx beyond AVG 90+% with small OM3/OM4; RCA TO (R-R collaterals);   Carotid bruit present 01/2012   a. 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 Also noted CTO of SVG-Diag; b) CRESCENDO ANGINA: 05/2013 - prox limb SVG-OM1-OM2 DES PCI & PTCA of 95% ISR in distal Limb overlapping DES-BMS; Myoview 08/25/13 - small non-T-mural Inf scar, No Ischemia   S/P CABG x 5 1998   LIMA-LAD, SeqSVG-OM1-OM2; SVG-rPDA, SVG-D1    Past Surgical History:  Procedure Laterality Date   CATARACT EXTRACTION Bilateral    Dr. EpDolores Lory CATARACT EXTRACTION W/ INTRAOCULAR LENS  IMPLANT, BILATERAL Bilateral    CORONARY ANGIOPLASTY WITH STENT PLACEMENT  2003   DR LITTLE: BMS 3.0 mm x 8 mm stent to Seq limb anastomosis of SeqSVG-OM1-OM2 into OM2; & cutting balloon SVG-Diag   CORONARY ANGIOPLASTY WITH STENT PLACEMENT  06/28/2013   Procedure: CORONARY ANGIOPLASTY WITH STENT PLACEMENT ;  Surgeon: Troy Sine, MD;  Location: [facility] CATH LAB;  Service: Cardiovascular: PCI to SVG-CxOM1-OM2 99% prox lesion (Resolute DES 3.0 x 26 - 3.4 mm) & PTCA of  95% ISR in sequential limb (3.0 mm Angiosculpt)    CORONARY ARTERY BYPASS GRAFT  1998   SVG-diagonal, DVG-OM, DVG-PDA, LIMA-LAD   CORONARY STENT INTERVENTION  01/13/2012     Procedure: CORONARY STENT INTERVENTION ;   Surgeon: Lorretta Harp, MD;  Location: [facility] CATH LAB;  Service: Cardiovascular: PCI Seq limb of Seq SVG-OM1-OM2 90% -> 0% - Xience Xpedition DES 2.75 MM x15 MM (3 MM) overlaps prox edge of BMS into OM2   ESOPHAGOGASTRODUODENOSCOPY N/A 06/29/2013   Procedure: ESOPHAGOGASTRODUODENOSCOPY (EGD);  Surgeon: Beryle Beams, MD;  Location: [facility] ENDOSCOPY;  Service: Endoscopy;  Laterality: N/A;   EYE SURGERY Bilateral    Cat Sx - Dr. Dolores Lory   LEFT HEART CATH AND CORS/GRAFTS ANGIOGRAPHY  2003   ABN CARDIOLITE:  100% SVG-PDA, patent LIMA; 80% anastomotic lesion SVG-Diag; 99% anastomotic lesion of Seq limb SVG-OM1-OM2; Native LM OK - 50% & 80% prox LAD, 80% D1; Native OM1 & OM2 TO; Native RCA CTO (R_R collaterals). => PCI of SVG-OM2 & PTCA of SVG-Diag   LEFT HEART CATHETERIZATION WITH CORONARY/GRAFT ANGIOGRAM N/A 01/13/2012   Procedure: LEFT HEART CATHETERIZATION WITH Beatrix Fetters;  Surgeon: Lorretta Harp, MD;  Location: [facility] CATH LAB;  Service: Cardiovascular: SVG-D1 CTO (new), knosn SVG-RCA CTO.  Native mRCA CTO (R-R collaterals); 50% distal LM, 70-80% prox LAD and 90% mid.  LIMA-LAD patent w/  70% post anastomosis. OM1 &OM2 CTO. Seq limb of SeqSVG-OM1-OM2 90% @ OM1 to BMS into OM2.   LEFT HEART CATHETERIZATION WITH CORONARY/GRAFT ANGIOGRAM N/A 06/28/2013   Procedure: LEFT HEART CATHETERIZATION WITH Beatrix Fetters;  Surgeon: Troy Sine, MD;  Location: [facility] CATH LAB;  Service: Cardiovascular: Cres Angina: a) 60% dLM; 70% Ostial LAD; 90% Cx - no OMs seen; 100% RCA;; SVG-D1 & SVG--dRCA occluded; Patent LIMA-LAD with dLAD ~70-80%;; SeqSVG-OM1-OM2: 95% proximal limb, 50% mid & Diffuse ISR in overlapping DES/BMS in Seq Limb OM-OM2   NM MYOVIEW LTD  07/2013   Test looked good!! Evidence of possible old MI / scar, but nothing to suggest ischemia. Not Gated b/c ectopy .   NM MYOVIEW LTD  06/2017   Intermediate risk because of reduced EF but stable.  Stable fixed inferior defect consistent with either  diaphragmatic attenuation or inferior infarct.  Septal hypokinesis/dyskinesis due to LBBB and prior CABG.  EF is 46%.  Stable compared to 2015 following PCI   TONSILLECTOMY     Current Outpatient Medications  Medication Instructions   amLODipine-benazepril (LOTREL) 10-20 MG capsule 1 capsule, Oral, Daily   amoxicillin-clavulanate (AUGMENTIN) 500-125 MG tablet 500 mg, Oral, 3 times daily   atenolol (TENORMIN) 50 mg, Oral, Daily   doxycycline (VIBRA-TABS) 100 mg, Oral, Every 12 hours   isosorbide mononitrate (IMDUR) 60 MG 24 hr tablet Take 1 tablet by mouth once daily   Multiple Vitamins-Minerals (PRESERVISION AREDS PO) 1 tablet, Oral, 2 times daily   naproxen sodium (ALEVE) 220 mg, Oral, Daily PRN   nitroGLYCERIN (NITROSTAT) 0.4 mg, Sublingual, Every 5 min PRN   rosuvastatin (CRESTOR) 20 MG tablet Take 1 tablet by mouth once daily   Saw Palmetto, Serenoa repens, (SAW PALMETTO PO) 1 tablet, Oral, Daily   ticagrelor (BRILINTA) 60 mg, Oral, 2 times daily     Family History  Problem Relation Age of Onset   Cancer Mother    Heart attack Father     Social History:  reports that he quit smoking about 31 years ago. His smoking use included cigarettes. He has a 60.00 pack-year smoking history. He has never used smokeless tobacco. He reports that he does not currently use alcohol. He reports that he does not use drugs.   Exam: Current vital signs: BP 121/66   Pulse 75   Temp (!) 101.1 F (38.4 C) (Oral)   Resp (!) 22   Ht '5\' 11"'$  (1.803 m)   Wt 77.3 kg   SpO2 96%   BMI 23.77 kg/m  Vital signs in last 24 hours: Temp:  [101.1 F (38.4 C)-102.2 F (39 C)] 101.1 F (38.4 C) (05/23 2200) Pulse Rate:  [31-94] 75 (05/23 2330) Resp:  [13-28] 22 (05/23 2330) BP: (121-155)/(61-94) 121/66 (05/23 2330) SpO2:  [95 %-99 %] 96 % (05/23 2330) Weight:  [77.3 kg] 77.3 kg (05/23 1811)   Physical Exam  Constitutional: Appears ill, warm to the touch, Psych: Sleeping, erratic when awoken Eyes:  No scleral injection HENT: No neck stiffness MSK: no joint deformities.  Cardiovascular: Perfusing extremities well Respiratory: Mildly tachypneic GI: Soft.  No distension. There is no tenderness.  Skin: Warm dry and intact visible skin, warm to the touch  Neuro: Mental Status: Patient is sleeping.  With repeated light touch he awakens, does not clearly follow commands but mimics.  Yells "I am okay, okay" but otherwise does not engage with examiner Cranial Nerves: II: No clear blink to threat. Pupils are round and reactive to light bilaterally III,IV, VI: Tracks examiner in bilaterally V: Facial sensation is symmetric to light eyelash brush VII: Facial movement is symmetric.  VIII: hearing is intact to voice (hard of hearing at baseline, but does react to examiner's voice) Remainder unable to assess given mental status Motor: He is able to maintain bilateral upper extremities antigravity without drift, though both arms pronate slightly.  Both lower extremities are at least 2/5, symmetric Sensory: Equally reactive to touch in all 4 extremities Deep Tendon Reflexes: 2+ and symmetric in the biceps Cerebellar: Unable to assess given mental status Gait:  Deferred   NIHSS total 13 Score breakdown: One-point for level of consciousness, 2 points for not answering questions, 2 points for not following commands, 3 points for left leg drift, 3 points for right leg drift, 2 points for severe aphasia though likely this is more confusion, Performed at 1:30 AM   I have reviewed labs in epic and the results pertinent to this consultation are: AST/ALT (97/146) elevations new from prior admission BNP is up to 871 from baseline of 246 7 days ago Troponin elevation to 151, 134 on recheck   I have reviewed the images obtained: CTA chest 1. Mild amount of pulmonary embolism within a lower lobe branch of the right pulmonary artery. 2. Marked severity right upper lobe and mild right middle  lobe infiltrate. 3. Cholelithiasis. 4. Colonic diverticulosis. 5. Findings within the posteromedial aspect of the spleen which may represent a splenic infarct of indeterminate age. 6. Mild to moderate severity prostatomegaly. Correlation with PSA levels is recommended.   Impression: This likely patient has a metabolic encephalopathy in the setting of worsening infection, fever, PE, heart strain.  However he does have significant cardiovascular risk factors.  Other explanations for his confusion include focal seizure or stroke.    Recommendations: -MRI brain, if patient is unable to tolerate full scans, DWI  will suffice to rule out stroke -Routine EEG -Stroke work-up only if MRI brain is positive -Note that cefepime is a deliriogenic antibiotic and should be narrowed when able to safely do so from an infectious perspective -Neurology will follow up MRI brain and EEG, but if these studies are negative we will be available on an as-needed basis going forward.  Please reach out if any questions or concerns arise   Lesleigh Noe MD-PhD [facility] [phone] Available 7 PM to 7 AM, outside of these hours please call Neurologist on call as listed on Amion.

## 2021-07-23 NOTE — Procedures (Addendum)
Patient Name: ALVIA TORY  MRN: [id number]  Epilepsy Attending: Lora Havens  Referring Physician/Provider: Lorenza Chick, MD Date:07/23/2021 Duration: 22.40 mins  Patient history: 81 year old male with altered mental status.  EEG to evaluate for seizure  Level of alertness: Asleep  AEDs during EEG study: None  Technical aspects: This EEG study was done with scalp electrodes positioned according to the 10-20 International system of electrode placement. Electrical activity was acquired at a sampling rate of '500Hz'$  and reviewed with a high frequency filter of '70Hz'$  and a low frequency filter of '1Hz'$ . EEG data were recorded continuously and digitally stored.   Description: Sleep was characterized by sleep spindles (12 to 14 Hz), maximal frontocentral region. EEG also showed continuous generalized 3 to 5 Hz theta-delta slowing. Hyperventilation and photic stimulation were not performed.     ABNORMALITY - Continuous slow, generalized  IMPRESSION: This study during sleep only is suggestive of mild to moderate diffuse encephalopathy, nonspecific etiology. No seizures or epileptiform discharges were seen throughout the recording.  If suspicion for interictal activity remains a concern, a prolonged study can be considered.   Ruthell Feigenbaum Barbra Sarks

## 2021-07-23 NOTE — Progress Notes (Signed)
Pharmacy Antibiotic Note  Xavier Giles is a 81 y.o. male admitted on 07/22/2021 with pneumonia.  Tm 102.2 F, WBC 10, CXR with increasing patchy airspace opacities in RUL concerning for progressive pneumonia. Vancomycin and Cefepime were initiated in the ED on 5/23, but have been discontinued. Pharmacy consulted to dose Zosyn (piperacillin-tazobactam).   Of note, patient was recent admitted (5/17-5/18) for CAP and received Levofloxacin 750 mg IV x 1, and was discharged on Augmentin and Doxycycline x 1 week.   Plan: Initiate Zosyn 3.375g IV q8h extended infusion  Monitor daily WBC, temp, SCr, and clinical status Follow-up cultures, LOT, de-escalate as able   Height: '5\' 11"'$  (180.3 cm) Weight: 77.3 kg (170 lb 6.7 oz) IBW/kg (Calculated) : 75.3  Temp (24hrs), Avg:101.7 F (38.7 C), Min:101.1 F (38.4 C), Max:102.2 F (39 C)  Recent Labs  Lab 07/16/21 1512 07/16/21 1525 07/16/21 1533 07/17/21 0156 07/22/21 1656 07/23/21 0330  WBC 11.9*  --   --  9.8 10.0 9.9  CREATININE 1.16  --  1.00 1.02 1.15 1.02  LATICACIDVEN  --  1.3  --   --  1.6  --      Estimated Creatinine Clearance: 59.5 mL/min (by C-G formula based on SCr of 1.02 mg/dL).    Allergies  Allergen Reactions   Keflex [Cephalexin] Other (See Comments)    Unknown ;   Patient states he is not aware of being allergic to cephalexin per [facility] resident MD report.     Zocor [Simvastatin] Other (See Comments)    Unknown    Lipitor [Atorvastatin] Other (See Comments)    Elevated blood sugar   Sulfa Antibiotics Nausea Only    Antimicrobials this admission: Vancomycin 5/23 x1 Cefepime 5/23 >> 5/24 Zosyn 5/24 >>   Microbiology results: 5/23 BCx: pending 5/23 UCx: pending   5/23 MRSA PCR: not detected   Thank you for allowing pharmacy to be a part of this patient's care.  Luisa Hart, PharmD, BCPS Clinical Pharmacist 07/23/2021 7:54 AM   Please refer to AMION for pharmacy phone number

## 2021-07-23 NOTE — Progress Notes (Signed)
ANTICOAGULATION CONSULT NOTE - Initial Consult  Pharmacy Consult for Heparin Infusion Indication: pulmonary embolus  Allergies  Allergen Reactions   Keflex [Cephalexin] Other (See Comments)    Unknown ;   Patient states he is not aware of being allergic to cephalexin per [facility] resident MD report.     Zocor [Simvastatin] Other (See Comments)    Unknown    Lipitor [Atorvastatin] Other (See Comments)    Elevated blood sugar   Sulfa Antibiotics Nausea Only    Patient Measurements: Height: '5\' 11"'$  (180.3 cm) Weight: 77.3 kg (170 lb 6.7 oz) IBW/kg (Calculated) : 75.3 Heparin Dosing Weight: 77.3 kg  Vital Signs: Temp: 100.1 F (37.8 C) (05/24 1132) Temp Source: Axillary (05/24 1132) BP: 154/134 (05/24 1030) Pulse Rate: 92 (05/24 1030)  Labs: Recent Labs    07/22/21 1656 07/22/21 1736 07/22/21 2045 07/22/21 2347 07/23/21 0330  HGB 13.5 13.9  --   --  12.4*  HCT 40.9 41.0  --   --  37.9*  PLT 308  --   --   --  259  LABPROT 16.2*  --   --   --   --   INR 1.3*  --   --   --   --   CREATININE 1.15  --   --   --  1.02  TROPONINIHS 143*  --  151* 134*  --     Estimated Creatinine Clearance: 59.5 mL/min (by C-G formula based on SCr of 1.02 mg/dL).   Medical History: Past Medical History:  Diagnosis Date   Asthma    CAD (coronary artery disease) of bypass graft 2003   (now known CTO of SVG-rPDA & SVG-D1 & 3 stents in SeqSVg-Om1-OM2 (1 in prox leg, overlapping DES-BMS from OM1-OM2 into OM2 - s/p PTCA for ISR).; LIMA-LAD patent    CAD S/P PTCA-PCI of SeqSVG-OM1-OM2 2003; 01/2012; 05/2013   a) '03 SeqSVG-OM1-OM2,  Zeta BMS 3.0x8 Seq limb into OM2), & PTCA of SVg-D1 anastomosis; b) 2013: Distal Limb of SeqSVG-OM1-OM2 from OM1-2 overlap BMS ->Xience DES 2.75 x 15 - 19m;; c) 05/2013 Proxlimb SVG--OM1-OM2 -> Resolute DES 3 x 26 - 3.268m Angiosculpt PTCA of ISR in Seq Limb overlaping stents (3.25 mm)   CAD, multiple vessel - Native CAD. 1998; 2015   a) Referred for CABGx5 ; by Cath  05/2013: dLM 60%, Ost LAD 70&. Prox -mid LAD extensive 70%( to-fro flow); D1 80%; OM1 & OM2 (CTO), follow-on LCx beyond AVG 90+% with small OM3/OM4; RCA TO (R-R collaterals);   Carotid bruit present 01/2012   a. 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 Also noted CTO of SVG-Diag; b) CRESCENDO ANGINA: 05/2013 - prox limb SVG-OM1-OM2 DES PCI & PTCA of 95% ISR in distal Limb overlapping DES-BMS; Myoview 08/25/13 - small non-T-mural Inf scar, No Ischemia   S/P CABG x 5 1998   LIMA-LAD, SeqSVG-OM1-OM2; SVG-rPDA, SVG-D1     Medications:  (Not in a hospital admission)   Assessment: [age]o M presenting with  AMS, not on anticoagulation PTA. CT chest with mild amount of pulmonary embolism within a lower lobe branch of the right pulmonary artery. Baseline Hgb 13.9, plt 308. No signs/symptoms of bleeding noted. Pharmacy initially consulted for heparin, then consulted to switch to enoxaparin. Enoxaparin was switched to rivaroxaban, however, pt now is vomiting and cannot swallow/tolerate po. Pharmacy now consulted to dose heparin infusion for treatment of PE.   Last enoxaparin dose: '80mg'$  given at 09:13 5/24 Goal of Therapy:  Heparin level 0.3-0.7 units/ml Monitor platelets by anticoagulation protocol: Yes   Plan:  Start heparin infusion at 1300 units/hr at 21:00 on 07/23/21.  Monitor heparin levels every 8 hours until in therapeutic range x2 Daily CBC, heparin level, and s/sx of bleeding.  Kaleen Mask 07/23/2021,12:27 PM

## 2021-07-23 NOTE — Progress Notes (Signed)
Patient ID: Xavier Giles, male   DOB: 23-Apr-1940, 81 y.o.   MRN: [id number]  Interval events:  - Emesis after being seen by SLP, minimally responsive, then agitated  Additional Data:   MRI brain completed at 19:04, personally reviewed, agree with radiology:  Moderate area of acute infarct involving the posterior right frontal lobe and posterior insula, with additional smaller areas of restricted diffusion in the bilateral frontal, parietal, and occipital lobes, as well as the cerebellar hemispheres and vermis.  EEG: - Continuous slow, generalized This study during sleep only is suggestive of mild to moderate diffuse encephalopathy, nonspecific etiology. No seizures or epileptiform discharges were seen throughout the recording.  Lab Results  Component Value Date   HGBA1C 7.7 (H) 07/23/2021    Lab Results  Component Value Date   CHOL 111 05/27/2021   HDL 48 05/27/2021   LDLCALC 45 05/27/2021   TRIG 93 05/27/2021   CHOLHDL 2.3 05/27/2021   Assessment: MRI brain has demonstrated multifocal strokes, with the largest in the left parietal lobe, with no evidence of hemorrhagic conversion.  With his fever, there is also concern for possible bacteremia/endocarditis given the pattern.  With his initial chest x-ray findings suggesting there could be an underlying malignancy, hypercoagulability of malignancy should also be considered as an etiology.  Given the relatively small size of his pulmonary embolus, the risk of intracerebral hemorrhage does not outweigh the benefits of anticoagulation at this time, especially with the possibility of endocarditis.  However if no life-threatening indication for anticoagulation arises, a low goal no bolus neurology protocol heparin drip could be considered.  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 Raymondo Band by phone to discontinue heparin drip immediately  - Stroke labs HgbA1c already completed,  goal < 7%, fasting lipid panel from march LDL 45, meeting goal, continue home therapy when able to take PO - CTA head and neck to be ordered  - Frequent neuro checks - Echocardiogram -- if TTE negative will likely need TEE to exclude vegetation - Given risk of ICH, would also hold ticagrelor for now unless critically needed for recent stent - Risk factor modification - Telemetry monitoring; 30 day event monitor on discharge if no arrythmias captured and no central embolic etiology identified  - Blood pressure goal   - Permissive hypertension to 220/120 - PT consult, OT consult, Speech consult - Appreciate primary team work-up for potential malignancy and management of pneumonia and other comorbidities - Stroke team to follow  No charge same-day progress note

## 2021-07-24 ENCOUNTER — Inpatient Hospital Stay (HOSPITAL_COMMUNITY): Payer: Medicare Other

## 2021-07-24 DIAGNOSIS — I6389 Other cerebral infarction: Secondary | ICD-10-CM | POA: Diagnosis not present

## 2021-07-24 DIAGNOSIS — I2699 Other pulmonary embolism without acute cor pulmonale: Secondary | ICD-10-CM | POA: Diagnosis not present

## 2021-07-24 DIAGNOSIS — I6310 Cerebral infarction due to embolism of unspecified precerebral artery: Secondary | ICD-10-CM | POA: Diagnosis not present

## 2021-07-24 DIAGNOSIS — G9340 Encephalopathy, unspecified: Secondary | ICD-10-CM | POA: Diagnosis not present

## 2021-07-24 LAB — BASIC METABOLIC PANEL
Anion gap: 8 (ref 5–15)
BUN: 21 mg/dL (ref 8–23)
CO2: 22 mmol/L (ref 22–32)
Calcium: 8.1 mg/dL — ABNORMAL LOW (ref 8.9–10.3)
Chloride: 102 mmol/L (ref 98–111)
Creatinine, Ser: 1.07 mg/dL (ref 0.61–1.24)
GFR, Estimated: >60 mL/min (ref >60)
Glucose, Bld: 148 mg/dL — ABNORMAL HIGH (ref 70–99)
Potassium: 4 mmol/L (ref 3.5–5.1)
Sodium: 132 mmol/L — ABNORMAL LOW (ref 135–145)

## 2021-07-24 LAB — ECHOCARDIOGRAM COMPLETE BUBBLE STUDY
Area-P 1/2: 5.42 cm2
S' Lateral: 3.9 cm
Single Plane A4C EF: 45.5 %

## 2021-07-24 LAB — CBC
HCT: 41.6 % (ref 39.0–52.0)
Hemoglobin: 13.3 g/dL (ref 13.0–17.0)
MCH: 27.8 pg (ref 26.0–34.0)
MCHC: 32 g/dL (ref 30.0–36.0)
MCV: 86.8 fL (ref 80.0–100.0)
Platelets: 271 10*3/uL (ref 150–400)
RBC: 4.79 MIL/uL (ref 4.22–5.81)
RDW: 13.8 % (ref 11.5–15.5)
WBC: 11.8 10*3/uL — ABNORMAL HIGH (ref 4.0–10.5)
nRBC: 0 % (ref 0.0–0.2)

## 2021-07-24 LAB — TROPONIN I (HIGH SENSITIVITY)
Troponin I (High Sensitivity): 229 ng/L (ref <18)
Troponin I (High Sensitivity): 215 ng/L (ref <18)

## 2021-07-24 LAB — GLUCOSE, CAPILLARY
Glucose-Capillary: 151 mg/dL — ABNORMAL HIGH (ref 70–99)
Glucose-Capillary: 151 mg/dL — ABNORMAL HIGH (ref 70–99)

## 2021-07-24 LAB — HEPARIN LEVEL (UNFRACTIONATED): Heparin Unfractionated: <0.1 IU/mL — ABNORMAL LOW (ref 0.30–0.70)

## 2021-07-24 IMAGING — CT CT ANGIO HEAD-NECK (W OR W/O PERF)
1 of 11 series · 14 of 47 positions shown · non-contrast
Comparison: No prior CTA, correlation is made with CT head
[DATE] and MRI head [DATE]

CLINICAL DATA: Stroke, follow-up

EXAM:
CT ANGIOGRAPHY HEAD AND NECK
TECHNIQUE: Multidetector CT imaging of the head and neck was performed using
the standard protocol during bolus administration of intravenous
contrast. Multiplanar CT image reconstructions and MIPs were
obtained to evaluate the vascular anatomy. Carotid stenosis
measurements (when applicable) are obtained utilizing NASCET
criteria, using the distal internal carotid diameter as the
denominator.

[Series 10: thin · axial · 0.48mm/px · z∈[+1077,+1414]mm · 14 of 771 slices shown]
[im 52/771  brain]
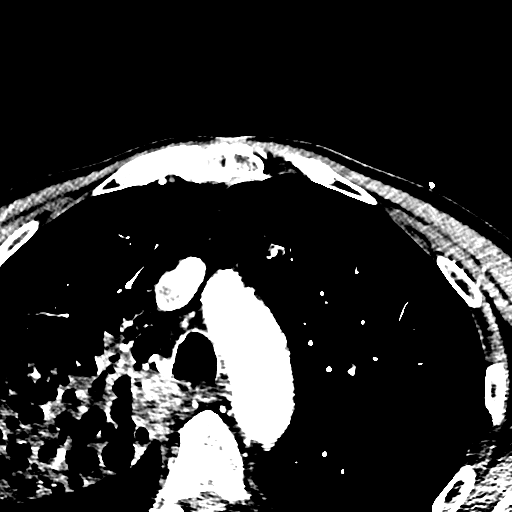
[im 103/771  bone]
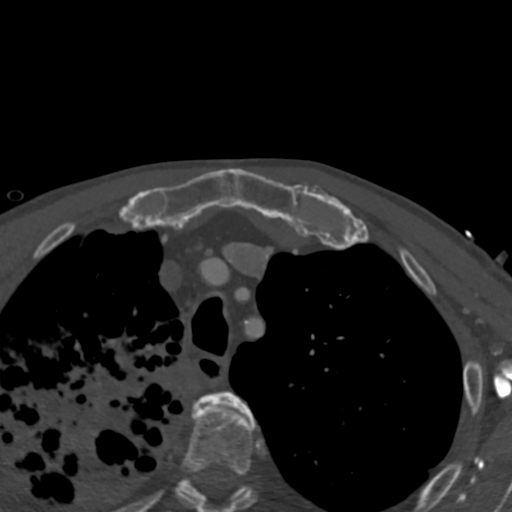
[im 155/771  brain]
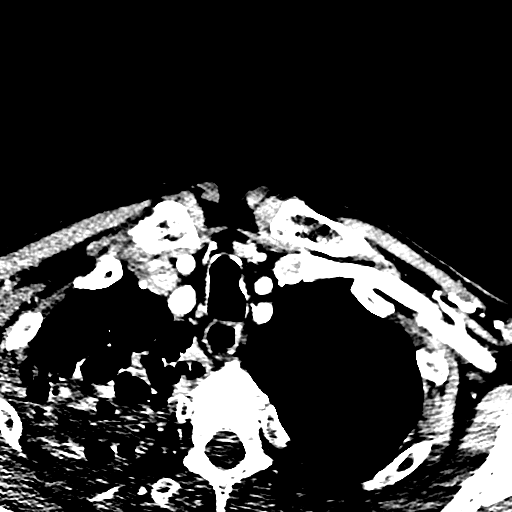
[im 206/771  bone]
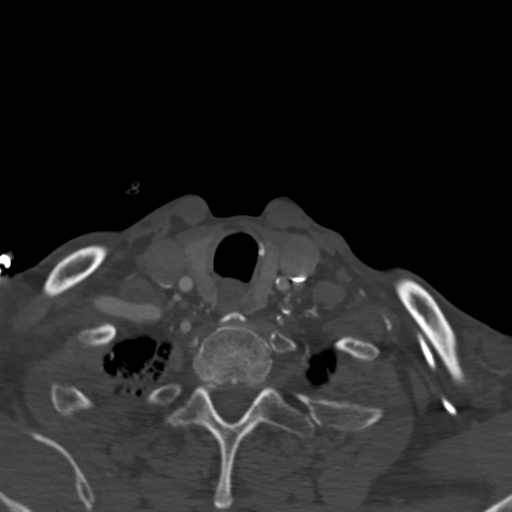
[im 257/771  brain]
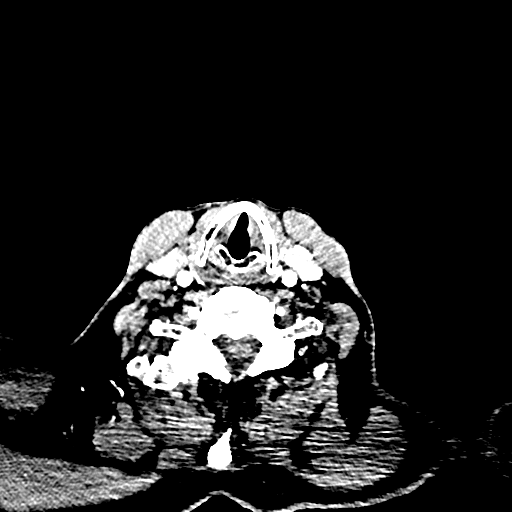
[im 309/771  bone]
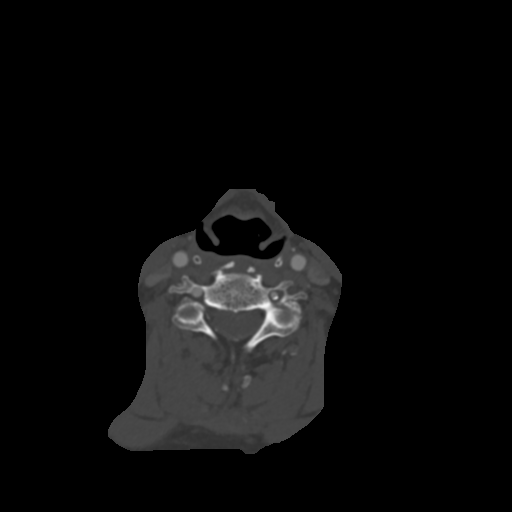
[im 360/771  brain]
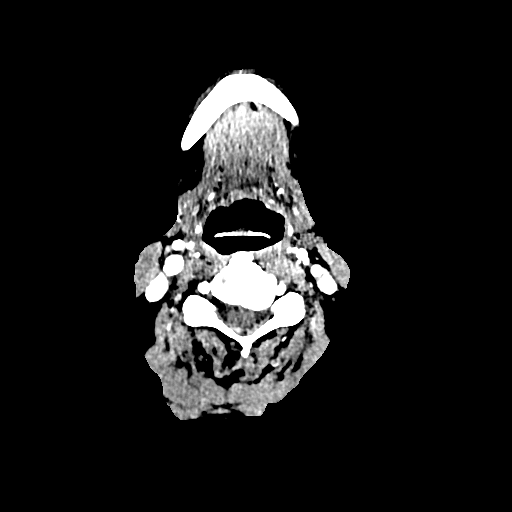
[im 411/771  bone]
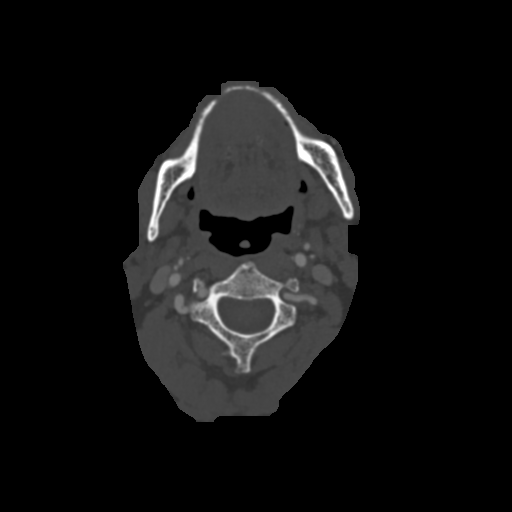
[im 463/771  brain]
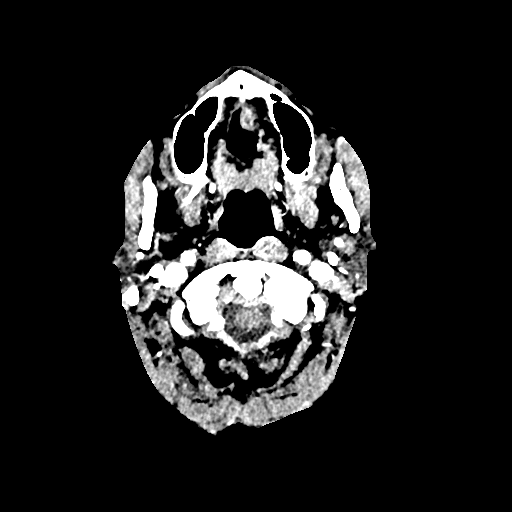
[im 514/771  bone]
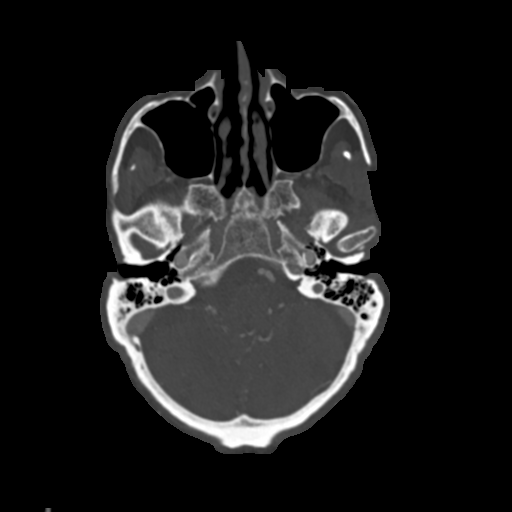
[im 565/771  brain]
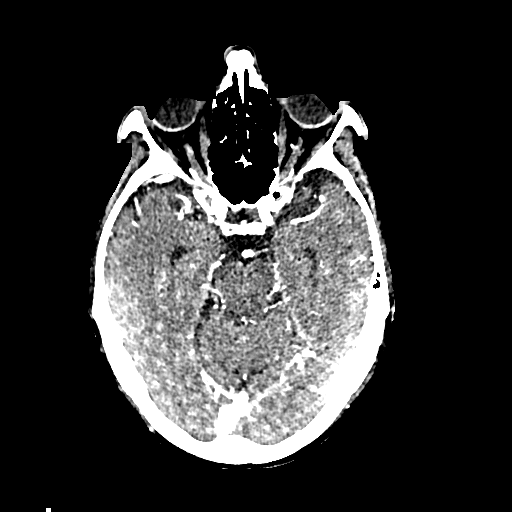
[im 617/771  bone]
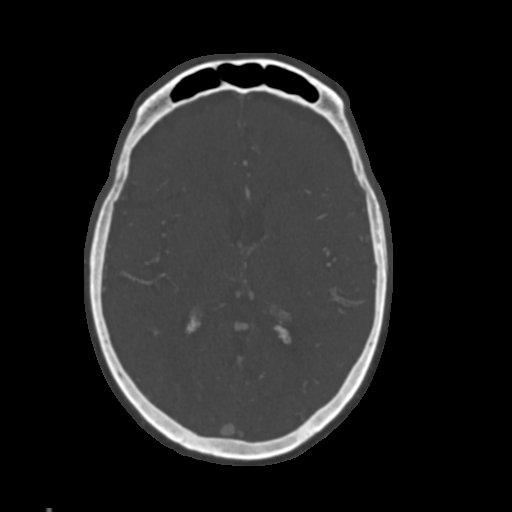
[im 668/771  brain]
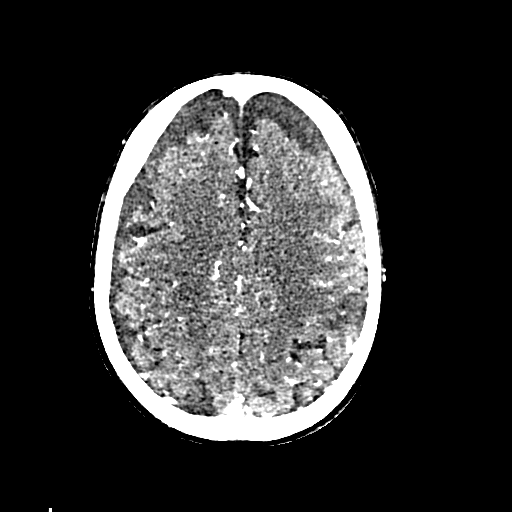
[im 719/771  bone]
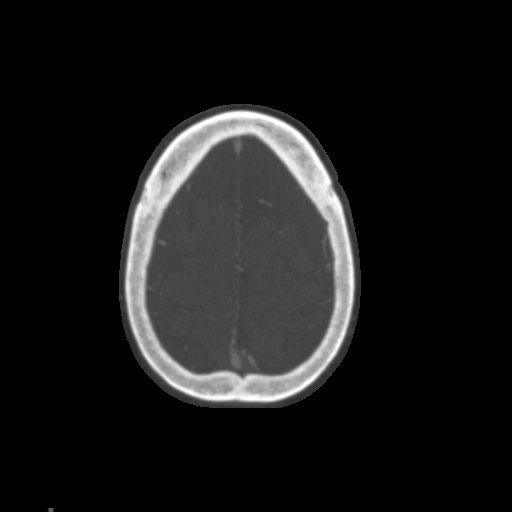

[14 of 47 positions shown; findings below may reference images not displayed]

RADIATION DOSE REDUCTION: This exam was performed according to the
departmental dose-optimization program which includes automated
exposure control, adjustment of the mA and/or kV according to
patient size and/or use of iterative reconstruction technique.

CONTRAST:  75mL OMNIPAQUE IOHEXOL 350 MG/ML SOLN
FINDINGS: CT HEAD FINDINGS

Brain: Increased conspicuity of hypodensity in the posterior right
frontal lobe extending to the posterior right insula, which
correlates with the area of acute infarct on the same-day MRI. No
new area of hypodensity to suggest additional infarct. No acute
hemorrhage, mass, mass effect, or midline shift. No hydrocephalus or
extra-axial collection.

Vascular: No hyperdense vessel. Atherosclerotic calcifications in
the intracranial carotid and vertebral arteries.

Skull: No acute osseous abnormality.

Sinuses: Imaged portions are clear.

Orbits: No acute finding.  Status post bilateral lens replacements.

CTA NECK FINDINGS

Aortic arch: Standard branching. Imaged portion shows no evidence of
aneurysm or dissection. Aortic atherosclerosis, which extends into
the origins of the branch vessels, causing 50% stenosis at the
origin of the brachiocephalic artery and left common carotid artery.
40% stenosis of the proximal left subclavian artery.

Right carotid system: Approximate 50% stenosis of the proximal right
ICA. Additional calcifications in the right common carotid artery
are not hemodynamically significant. No evidence of occlusion or
dissection.

Left carotid system: No evidence of dissection, occlusion, or
hemodynamically significant stenosis (greater than 50%).
Atherosclerotic plaque at the bifurcation and in the proximal left
ICA is not hemodynamically significant.

Vertebral arteries: Mild narrowing at the origin of the left
vertebral artery, with additional severe stenosis in the mid V1
segment. Multifocal moderate narrowing in the V2 segment, and mild
narrowing in the left V3. Moderate narrowing at the origin of the
right vertebral artery without additional hemodynamically
significant stenosis. Right dominant system. No evidence of
dissection or occlusion.

Skeleton: Status post median sternotomy. Degenerative changes in the
cervical spine. Edentulous. No acute osseous abnormality.

Other neck: Multiple hypoenhancing foci in the thyroid, the largest
of which measures up to 1 cm, for which no follow-up is indicated.
(Reference: [HOSPITAL]. [DATE]): 143-50)

Upper chest: Redemonstrated right upper and middle lobe infiltrative
process, better evaluated on the [DATE] CT. Emphysema.

Review of the MIP images confirms the above findings

CTA HEAD FINDINGS

Anterior circulation: Both internal carotid arteries are patent to
the termini, with calcifications causing moderate to severe stenosis
in the bilateral cavernous segments and mild-to-moderate stenosis of
the bilateral supraclinoid segments.

A1 segments patent. Normal anterior communicating artery. Anterior
cerebral arteries are patent to their distal aspects.

No M1 stenosis or occlusion. Normal MCA bifurcations. Distal MCA
branches perfused and symmetric.

Posterior circulation: Vertebral arteries patent to the
vertebrobasilar junction with moderate to severe stenosis in the
proximal right V4, prior to the PICA takeoff, and moderate stenosis
in the left V4 prior to the PICA takeoff and more severe stenosis
just distal to the takeoff. Posterior inferior cerebral arteries
patent bilaterally.

Basilar patent to its distal aspect. Superior cerebellar arteries
patent bilaterally.

Patent P1 segments. Multifocal irregularity in the bilateral PCAs,
with severe stenosis in the proximal right P2. The bilateral
posterior communicating arteries are not definitively visualized.

Venous sinuses: As permitted by contrast timing, patent.

Anatomic variants: None significant.

Review of the MIP images confirms the above findings
IMPRESSION: 1. Multifocal atherosclerotic disease in the neck, which causes 50%
stenosis of the proximal right ICA, 50% stenosis of the origin of
the brachiocephalic artery, 50% stenosis at the origin of the left
common carotid artery, and 40% stenosis of the proximal left
subclavian artery. In addition there is multifocal narrowing in the
bilateral vertebral arteries, which is severe in the mid left V1,
and moderate in the left V2 and at the origin of the right vertebral
artery.
2. Moderate to severe stenosis in the bilateral cavernous carotids
and mild-to-moderate stenosis of bilateral supraclinoid segments.
Additional moderate to severe stenosis in the proximal right V4, mid
to distal left V4, and right P2. No large vessel occlusion.
3. On the non con CT, there is increased conspicuity of the acute
infarct seen on MRI in the posterior left frontal lobe and posterior
left insula. No additional acute intracranial process.

## 2021-07-24 MED ORDER — TICAGRELOR 60 MG PO TABS
60.0000 mg | ORAL_TABLET | Freq: Two times a day (BID) | ORAL | Status: DC
  Filled 2021-07-24 (×2): qty 1

## 2021-07-24 MED ORDER — SODIUM CHLORIDE 0.9 % IV SOLN: INTRAVENOUS | Status: DC

## 2021-07-24 MED ORDER — ENOXAPARIN SODIUM 40 MG/0.4ML IJ SOSY: 40.0000 mg | PREFILLED_SYRINGE | INTRAMUSCULAR | Status: DC

## 2021-07-24 MED ORDER — TICAGRELOR 60 MG PO TABS
60.0000 mg | ORAL_TABLET | Freq: Two times a day (BID) | ORAL | Status: DC
  Administered 2021-07-24 (×2): 60 mg via ORAL
  Filled 2021-07-24 (×3): qty 1

## 2021-07-24 MED ORDER — IOHEXOL 350 MG/ML SOLN
75.0000 mL | Freq: Once | INTRAVENOUS | Status: AC | PRN
  Administered 2021-07-24: 75 mL via INTRAVENOUS

## 2021-07-24 MED ORDER — APIXABAN 5 MG PO TABS
5.0000 mg | ORAL_TABLET | Freq: Two times a day (BID) | ORAL | Status: DC
  Administered 2021-07-24 – 2021-07-26 (×4): 5 mg via ORAL
  Filled 2021-07-24 (×4): qty 1

## 2021-07-24 NOTE — Progress Notes (Addendum)
STROKE TEAM PROGRESS NOTE   INTERVAL HISTORY His daughter is at the bedside.  Afebrile over night but febrile yesterday, WBC 11.8. Primary team planning to order TEE.  2D echo shows decreased ejection fraction of 40 to 45% with global hypokinesis and dilated left atrium.  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  Lower extremity venous Dopplers are pending  Vitals:   07/23/21 2126 07/23/21 2356 07/24/21 0336 07/24/21 0743  BP: (!) 127/56 122/68 127/64 128/78  Pulse: 76 75 80 87  Resp: '20 20 16 17  '$ Temp: 98.8 F (37.1 C) 97.8 F (36.6 C) 98.1 F (36.7 C) (!) 97.5 F (36.4 C)  TempSrc: Oral  Oral Oral  SpO2: 98% 98% 98% 98%  Weight:      Height:       CBC:  Recent Labs  Lab 07/22/21 1656 07/22/21 1736 07/23/21 0330 07/24/21 0138  WBC 10.0  --  9.9 11.8*  NEUTROABS 8.0*  --   --   --   HGB 13.5   < > 12.4* 13.3  HCT 40.9   < > 37.9* 41.6  MCV 85.6  --  86.7 86.8  PLT 308  --  259 271   < > = values in this interval not displayed.   Basic Metabolic Panel:  Recent Labs  Lab 07/23/21 0330 07/24/21 0138  NA 134* 132*  K 3.8 4.0  CL 103 102  CO2 23 22  GLUCOSE 195* 148*  BUN 18 21  CREATININE 1.02 1.07  CALCIUM 8.2* 8.1*   Lipid Panel: No results for input(s): CHOL, TRIG, HDL, CHOLHDL, VLDL, LDLCALC in the last 168 hours. HgbA1c:  Recent Labs  Lab 07/23/21 0330  HGBA1C 7.7*   Urine Drug Screen:  Recent Labs  Lab 07/22/21 1651  LABOPIA NONE DETECTED  COCAINSCRNUR NONE DETECTED  LABBENZ NONE DETECTED  AMPHETMU NONE DETECTED  THCU NONE DETECTED  LABBARB NONE DETECTED    Alcohol Level  Recent Labs  Lab 07/22/21 1656  ETH <10    IMAGING past 24 hours CT ANGIO HEAD NECK W WO CM  Result Date: 07/24/2021 CLINICAL DATA:  Stroke, follow-up EXAM: CT ANGIOGRAPHY HEAD AND NECK TECHNIQUE: Multidetector CT imaging of the head and neck was performed using the standard protocol during bolus administration of intravenous contrast. Multiplanar  CT image reconstructions and MIPs were obtained to evaluate the vascular anatomy. Carotid stenosis measurements (when applicable) are obtained utilizing NASCET criteria, using the distal internal carotid diameter as the denominator. RADIATION DOSE REDUCTION: This exam was performed according to the departmental dose-optimization program which includes automated exposure control, adjustment of the mA and/or kV according to patient size and/or use of iterative reconstruction technique. CONTRAST:  41m OMNIPAQUE IOHEXOL 350 MG/ML SOLN COMPARISON:  No prior CTA, correlation is made with CT head 07/22/2021 and MRI head 07/23/2021 FINDINGS: CT HEAD FINDINGS Brain: Increased conspicuity of hypodensity in the posterior right frontal lobe extending to the posterior right insula, which correlates with the area of acute infarct on the same-day MRI. No new area of hypodensity to suggest additional infarct. No acute hemorrhage, mass, mass effect, or midline shift. No hydrocephalus or extra-axial collection. Vascular: No hyperdense vessel. Atherosclerotic calcifications in the intracranial carotid and vertebral arteries. Skull: No acute osseous abnormality. Sinuses: Imaged portions are clear. Orbits: No acute finding.  Status post bilateral lens replacements. CTA NECK FINDINGS Aortic arch: Standard branching. Imaged portion shows no evidence of aneurysm or dissection. Aortic atherosclerosis, which extends into the origins of  the branch vessels, causing 50% stenosis at the origin of the brachiocephalic artery and left common carotid artery. 40% stenosis of the proximal left subclavian artery. Right carotid system: Approximate 50% stenosis of the proximal right ICA. Additional calcifications in the right common carotid artery are not hemodynamically significant. No evidence of occlusion or dissection. Left carotid system: No evidence of dissection, occlusion, or hemodynamically significant stenosis (greater than 50%).  Atherosclerotic plaque at the bifurcation and in the proximal left ICA is not hemodynamically significant. Vertebral arteries: Mild narrowing at the origin of the left vertebral artery, with additional severe stenosis in the mid V1 segment. Multifocal moderate narrowing in the V2 segment, and mild narrowing in the left V3. Moderate narrowing at the origin of the right vertebral artery without additional hemodynamically significant stenosis. Right dominant system. No evidence of dissection or occlusion. Skeleton: Status post median sternotomy. Degenerative changes in the cervical spine. Edentulous. No acute osseous abnormality. Other neck: Multiple hypoenhancing foci in the thyroid, the largest of which measures up to 1 cm, for which no follow-up is indicated. (Reference: J Am Coll Radiol. 2015 Feb;12(2): 143-50) Upper chest: Redemonstrated right upper and middle lobe infiltrative process, better evaluated on the 07/22/2021 CT. Emphysema. Review of the MIP images confirms the above findings CTA HEAD FINDINGS Anterior circulation: Both internal carotid arteries are patent to the termini, with calcifications causing moderate to severe stenosis in the bilateral cavernous segments and mild-to-moderate stenosis of the bilateral supraclinoid segments. A1 segments patent. Normal anterior communicating artery. Anterior cerebral arteries are patent to their distal aspects. No M1 stenosis or occlusion. Normal MCA bifurcations. Distal MCA branches perfused and symmetric. Posterior circulation: Vertebral arteries patent to the vertebrobasilar junction with moderate to severe stenosis in the proximal right V4, prior to the PICA takeoff, and moderate stenosis in the left V4 prior to the PICA takeoff and more severe stenosis just distal to the takeoff. Posterior inferior cerebral arteries patent bilaterally. Basilar patent to its distal aspect. Superior cerebellar arteries patent bilaterally. Patent P1 segments. Multifocal  irregularity in the bilateral PCAs, with severe stenosis in the proximal right P2. The bilateral posterior communicating arteries are not definitively visualized. Venous sinuses: As permitted by contrast timing, patent. Anatomic variants: None significant. Review of the MIP images confirms the above findings IMPRESSION: 1. 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 In addition there is multifocal narrowing in the bilateral vertebral arteries, which is severe in the mid left V1, and moderate in the left V2 and at the origin of the right vertebral artery. 2. Moderate to severe stenosis in the bilateral cavernous carotids and mild-to-moderate stenosis of bilateral supraclinoid segments. Additional moderate to severe stenosis in the proximal right V4, mid to distal left V4, and right P2. No large vessel occlusion. 3. On the non con CT, there is increased conspicuity of the acute infarct seen on MRI in the posterior left frontal lobe and posterior left insula. No additional acute intracranial process. Electronically Signed   By: Merilyn Baba M.D.   On: 07/24/2021 02:45   MR BRAIN WO CONTRAST  Result Date: 07/23/2021 CLINICAL DATA:  Mental status change, unknown cause EXAM: MRI HEAD WITHOUT CONTRAST TECHNIQUE: Multiplanar, multiecho pulse sequences of the brain and surrounding structures were obtained without intravenous contrast. COMPARISON:  No prior  MRI, correlation is made with CT head 07/22/2021 FINDINGS: Brain: Moderate size area of restricted diffusion with ADC correlate in the posterior right frontal lobe and posterior insula, which extends from white matter to cortex, measuring approximately 2.8 x 3.7 x 2.8 cm (AP x TR x CC) (series 3, image 35 and series 4, image 18).  Additional smaller foci of restricted diffusion are seen in the left-greater-than-right cerebellar hemispheres and vermis (series 3, images 12-22), bilateral occipital lobes (series 3, image 24), left caudate head (series 3, image 35), bilateral caudate (series 3, images 34-36), bilateral parietal lobes (series 3, image 43 in 48), and bilateral posterior frontal lobes (series 3, images 44 and 47). No acute hemorrhage, mass, mass effect, or midline shift. No hydrocephalus or extra-axial collection. Scattered T2 hyperintense signal in the periventricular white matter, likely the sequela of mild chronic small vessel ischemic disease. Degree of cerebral volume loss is commensurate with increased size of extra-axial spaces and is within normal limits for age. Remote infarcts in the bilateral cerebellar hemispheres. Vascular: Normal flow voids. Skull and upper cervical spine: Normal marrow signal. Sinuses/Orbits: No acute finding. Status post bilateral lens replacements. Other: Fluid in the right mastoid air cells. IMPRESSION: Moderate area of acute infarct involving the posterior right frontal lobe and posterior insula, with additional smaller areas of restricted diffusion in the bilateral frontal, parietal, and occipital lobes, as well as the cerebellar hemispheres and vermis. These results will be called to the ordering clinician or representative by the Radiologist Assistant, and communication documented in the PACS or [employer]. Electronically Signed   By: Merilyn Baba M.D.   On: 07/23/2021 19:20   EEG adult  Result Date: 07/23/2021 Lora Havens, MD     07/23/2021  9:35 AM Patient Name: Xavier Giles MRN: [id number] Epilepsy Attending: Lora Havens Referring Physician/Provider: Lorenza Chick, MD Date:07/23/2021 Duration: 22.40 mins Patient history: 81 year old male with altered mental status.  EEG to evaluate for seizure Level of alertness: Asleep AEDs during EEG study: None Technical aspects:  This EEG study was done with scalp electrodes positioned according to the 10-20 International system of electrode placement. Electrical activity was acquired at a sampling rate of '500Hz'$  and reviewed with a high frequency filter of '70Hz'$  and a low frequency filter of '1Hz'$ . EEG data were recorded continuously and digitally stored. Description: Sleep was characterized by sleep spindles (12 to 14 Hz), maximal frontocentral region. EEG also showed continuous generalized 3 to 5 Hz theta-delta slowing. Hyperventilation and photic stimulation were not performed.   ABNORMALITY - Continuous slow, generalized IMPRESSION: This study during sleep only is suggestive of mild to moderate diffuse encephalopathy, nonspecific etiology. No seizures or epileptiform discharges were seen throughout the recording. If suspicion for interictal activity remains a concern, a prolonged study can be considered. Priyanka Barbra Sarks   US Abdomen Limited RUQ (LIVER/GB)  Result Date: 07/23/2021 CLINICAL DATA:  Elevated liver function tests. EXAM: ULTRASOUND ABDOMEN LIMITED RIGHT UPPER QUADRANT COMPARISON:  CT yesterday FINDINGS: Gallbladder: Multiple gallstones present within the gallbladder, the largest measuring 7 mm in size. No wall thickening or surrounding fluid. No Murphy sign. Common bile duct: Diameter: 2.8 mm, normal. Liver: No focal lesion identified. Within normal limits in parenchymal echogenicity. Portal vein is patent on color Doppler imaging with normal direction of blood flow towards the liver. Other: 2 cm right renal cyst.  No follow-up recommended. IMPRESSION: Cholelithiasis without sonographic evidence of cholecystitis or obstruction. Several small stones, the largest measuring 7 mm. Normal  appearance of the liver itself. Electronically Signed   By: Nelson Chimes M.D.   On: 07/23/2021 08:59    PHYSICAL EXAM  Physical Exam  Constitutional: Appears well-developed and well-nourished pleasant elderly gentleman who is extremely hard  of hearing.  Cardiovascular: Normal rate and regular rhythm.  Respiratory: Effort normal, non-labored breathing  Neuro: Mental Status: Patient is awake and visually tracks but due to his significant decreased hearing, does not clearly follow verbal commands well but mimics.  Yells "I am okay, okay" but otherwise does not engage with examiner Cranial Nerves: II: No clear blink to threat. Pupils are round and reactive to light bilaterally III,IV, VI: Tracks examiner in bilaterally.  Diminished right eye peripheral visual field and baseline. V: Facial sensation is symmetric to light eyelash brush VII: Facial movement is symmetric.  VIII: hearing is intact to voice (hard of hearing at baseline, but does react to examiner's voice) Remainder unable to assess given mental status Motor: He is able to maintain bilateral upper extremities antigravity without drift, though both arms pronate slightly.  Both lower extremities are at least 2/5, symmetric Sensory: Equally reactive to touch in all 4 extremities Deep Tendon Reflexes: 2+ and symmetric in the biceps Cerebellar: Unable to assess given mental status Gait:  Deferred    ASSESSMENT/PLAN Xavier Giles is a 81 y.o. male with history of CAD s/p CABG and multiple PCI stents, hypertension, hyperlipidemia and recently admitted for community acquired pneumonia who presents to [facility] with altered mental status and fever. MRI shows scattered multifocal strokes. Venous duplex and TEE are pending. 2D echo shows intracardiac shunt, positive bubble study but no evidence of vegetation.  CTA shows multifocal stenosis in the vertebral arteries bilaterally. SLP re- eval pending after concern for aspiration yesterday.    Stroke:  multifocal strokes in the bilateral frontal, parietal, and occipital with the largest in the left parietal lobe infarct likely secondary to central embolic source, concern for endocarditis versus paradoxical embolism given finding of  PFO and pulmonary embolism concurrently Code Stroke CT head No acute abnormality. Small vessel disease. Atrophy.  CTA head & neck -50% stenosis of the proximal right ICA, 50% stenosis of the origin of the brachiocephalic artery, 67% stenosis at the origin of the left common carotid artery, and 40% stenosis of the proximal left subclavian artery. Multifocal narrowing in the bilateral vertebral arteries, which is severe in the mid left V1, and moderate in the left V2 and at the origin of the right vertebral artery. Moderate to severe stenosis in the bilateral cavernous carotids and mild-to-moderate stenosis of bilateral supraclinoid segments. Additional moderate to severe stenosis in the proximal right V4, mid to distal left V4, and right P2.  MRI  Moderate area of acute infarct involving the posterior right frontal lobe and posterior insula, with additional smaller areas of restricted diffusion in the bilateral frontal, parietal, and occipital lobes, as well as the cerebellar hemispheres and vermis. Venous duplex lower extremity pending 2D Echo Agitated saline contrast was given intravenously to evaluate for intracardiac shunting. Agitated saline contrast bubble study was positive  -EF 40-45%, global hypokinesis.   EEG : Abnormal mitral valve mild to moderate diffuse encephalopathy, nonspecific etiology. No seizures or epileptiform discharges were seen throughout the recording. LDL 45 HgbA1c 7.7 VTE prophylaxis - SCDs    Diet   DIET DYS 3 Room service appropriate? No; Fluid consistency: Thin   Brilinta (ticagrelor) 90 mg bid prior to admission, now on No antithrombotic pending endocarditis workup  Therapy recommendations:  Pending Disposition:  pending  Hypertension Home meds:  Lotrel, atenolol, Imdur Stable Permissive hypertension (OK if < 220/120) but gradually normalize in 5-7 days Svehla-term BP goal normotensive  Hyperlipidemia Home meds: Rosuvastatin, resumed in hospital LDL 45, goal <  70 Continue statin at discharge  Pulmonary Embolism  PE in lower lobe of right pulmonary artery  Sepsis secondary to CAP with hypoxic respiratory failure Acute metabolic encephalopathy  Diabetes type II Uncontrolled Home meds: None HgbA1c 7.7, goal < 7.0 CBGs Recent Labs    07/23/21 1142 07/23/21 1705 07/23/21 2148  GLUCAP 145* 172* 126*    SSI  Other Stroke Risk Factors Advanced Age >/= 63  Hx stroke/TIA CAD s/p CABG x5, multiple PCI's Home meds: Imdur, Brilinta, Crestor Congestive heart failure EF 40-45% Home med-Imdur  Other Active Problems Confusion/Agitation  One time dose of haldol from primary team Frequent reorientation, delirium precautions.   Hospital day # 2  Patient seen and examined by NP/APP with MD. MD to update note as needed.   Janine Ores, DNP, FNP-BC [facility] Pager: [phone]    STROKE MD NOTE :  I have personally obtained history,examined this patient, reviewed notes, independently viewed imaging studies, participated in medical decision making and plan of care.ROS completed by me personally and pertinent positives fully documented  I have made any additions or clarifications directly to the above note. Agree with note above.  Patient presented with intermittent confusion and right upper extremity weakness and MRI scan shows multifocal by cerebral embolic infarcts likely from a central source.  He has been found to be febrile raising concerns for suspected endocarditis and also have concurrent pulmonary embolism and now 2D echo shows atrial level shunt raising possibility of paradoxical embolism.  Recommend anticoagulation with Eliquis for pulmonary embolism and check TEE for vegetations and blood cultures.  Mobilize out of bed.  Therapy consults.  Does not need prolonged cardiac monitoring at discharge as he is going to be on anticoagulation in the short-term at least so findings will not change treatment.  From discussion  patient and daughter and answered questions.  Discussed with Dr. Johnnye Sima and internal medicine teaching service team.  Greater than 50% time during this 50-minute visit was spent in counseling and coordination of care and discussion patient care team answering questions.  Antony Contras, MD Medical Director [facility] Pager: [phone] 07/24/2021 1:20 PM  To contact Stroke Continuity provider, please refer to [email or web address] After hours, contact General Neurology

## 2021-07-24 NOTE — Evaluation (Signed)
Occupational Therapy Evaluation Patient Details Name: Xavier Giles MRN: [id number] DOB: 1940/12/27 Today's Date: 07/24/2021   History of Present Illness Pt is a 81 y/o male presenting on 5/23 with AMS. Admitted for sepsis due to CAP. Found with Right Lower Lobe pulmonary embolism. 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   Clinical Impression   PTA patient independent and driving short distances, assist from family as needed. Admitted for above and presents with problem list below, including impaired balance, decreased activity tolerance, and impaired vision.  Patient HOH with vision/cognition very difficult to assess.  Pt requires cueing to locate items at sink, using his hands to locate doorways and it is very unclear if this is worse than typical.  Pt able to follow simple commands and gestures, but unable to assess cognition in depth. He requires min guard to supervision for ADLs and mobility in room.  Educated pt and daughter on recommendations of 24/7 supervision initially, assist with cooking, medications and driving.  Will follow acutely and recommend HHOT at dc.       Recommendations for follow up therapy are one component of a multi-disciplinary discharge planning process, led by the attending physician.  Recommendations may be updated based on patient status, additional functional criteria and insurance authorization.   Follow Up Recommendations  Home health OT    Assistance Recommended at Discharge Frequent or constant Supervision/Assistance  Patient can return home with the following A little help with walking and/or transfers;A little help with bathing/dressing/bathroom;Assistance with cooking/housework;Direct supervision/assist for medications  management;Direct supervision/assist for financial management;Assist for transportation;Help with stairs or ramp for entrance    Functional Status Assessment  Patient has had a recent decline in their functional status and demonstrates the ability to make significant improvements in function in a reasonable and predictable amount of time.  Equipment Recommendations  Tub/shower seat    Recommendations for Other Services       Precautions / Restrictions Precautions Precautions: Fall (lower fall risk) Restrictions Weight Bearing Restrictions: No      Mobility Bed Mobility Overal bed mobility: Needs Assistance Bed Mobility: Supine to Sit, Sit to Supine     Supine to sit: Modified independent (Device/Increase time) Sit to supine: Modified independent (Device/Increase time)        Transfers                          Balance Overall balance assessment: Needs assistance Sitting-balance support: No upper extremity supported, Feet supported Sitting balance-Leahy Scale: Good     Standing balance support: No upper extremity supported, During functional activity Standing balance-Leahy Scale: Fair                             ADL either performed or assessed with clinical judgement   ADL Overall ADL's : Needs assistance/impaired     Grooming: Supervision/safety;Standing Grooming Details (indicate cue type and reason): cueing to locate items at sink         Upper Body Dressing : Set up;Sitting   Lower Body Dressing: Supervision/safety;Sit to/from stand   Toilet Transfer: Supervision/safety;Ambulation   Toileting- Clothing Manipulation and Hygiene: Supervision/safety;Sit to/from stand       Functional mobility during ADLs: Supervision/safety  Vision Baseline Vision/History: 1 Wears glasses Ability to See in Adequate Light: 1 Impaired Patient Visual Report: Peripheral vision impairment;No change from baseline (macular  degeneration) Vision Assessment?: Vision impaired- to be further tested in functional context Additional Comments: pt with baseline deficits, pt feeling for doorway to exit with PT, difficulty locating the soap and paper towels standing at the sink. Demonstrates increased frustration with visual questions, when speaking to daughter she believe his vision is baseline but hard to tell.     Perception     Praxis      Pertinent Vitals/Pain Pain Assessment Pain Assessment: No/denies pain Pain Intervention(s): Monitored during session     Hand Dominance Right   Extremity/Trunk Assessment Upper Extremity Assessment Upper Extremity Assessment: Generalized weakness   Lower Extremity Assessment Lower Extremity Assessment: Defer to PT evaluation   Cervical / Trunk Assessment Cervical / Trunk Assessment: Normal   Communication Communication Communication: HOH   Cognition Arousal/Alertness: Awake/alert Behavior During Therapy: WFL for tasks assessed/performed Overall Cognitive Status: Difficult to assess                                 General Comments: pt follows simple commands, requires cueing for safety.  Difficult to determine due to [facility].     General Comments  Due to pt's extreme HOH it was difficult to provide cues and direction moment by moment and thus hard to challenge pt's cognition and mobility. Pt's daughter was present to witness mobility and  was happy with his mobility.  She states that they (family) can step up supervision/assist as needed up to 24/7.    Exercises     Shoulder Instructions      Home Living Family/patient expects to be discharged to:: Private residence Living Arrangements: Alone Available Help at Discharge: Family;Available 24 hours/day Type of Home: House Home Access: Stairs to enter [employer] of Steps: 2   Home Layout: One level     Bathroom Shower/Tub: Teacher, early years/pre: Handicapped height      Home Equipment: None          Prior Functioning/Environment Prior Level of Function : Independent/Modified Independent;Driving               ADLs Comments: independent, driving short distances but granddaughter assists with larger errands, manages meds        OT Problem List: Decreased strength;Decreased activity tolerance;Decreased safety awareness;Decreased knowledge of use of DME or AE;Decreased knowledge of precautions      OT Treatment/Interventions: Self-care/ADL training;Therapeutic exercise;DME and/or AE instruction;Therapeutic activities;Balance training;Patient/family education;Visual/perceptual remediation/compensation    OT Goals(Current goals can be found in the care plan section) Acute Rehab OT Goals Patient Stated Goal: home OT Goal Formulation: With patient Time For Goal Achievement: 08/07/21 Potential to Achieve Goals: Good  OT Frequency: Min 2X/week    Co-evaluation              AM-PAC OT "6 Clicks" Daily Activity     Outcome Measure Help from another person eating meals?: A Little Help from another person taking care of personal grooming?: A Little Help from another person toileting, which includes using toliet, bedpan, or urinal?: A Little Help from another person bathing (including washing, rinsing, drying)?: A Little Help from another person to put on and taking off regular upper body clothing?: A Little Help from another person to put on and taking off regular lower body clothing?: A Little 6  Click Score: 18   End of Session Nurse Communication: Mobility status  Activity Tolerance: Patient tolerated treatment well Patient left: in chair;with call bell/phone within reach;with chair alarm set;with family/visitor present  OT Visit Diagnosis: Other abnormalities of gait and mobility (R26.89);Muscle weakness (generalized) (M62.81);Other symptoms and signs involving the nervous system (R29.898)                Time: 6286-3817 OT Time  Calculation (min): 25 min Charges:  OT General Charges $OT Visit: 1 Visit OT Evaluation $OT Eval Moderate Complexity: 1 Mod  Jolaine Artist, OT Acute Rehabilitation Services Pager [phone] Office [phone]   Delight Stare 07/24/2021, 1:53 PM

## 2021-07-24 NOTE — Plan of Care (Signed)

## 2021-07-24 NOTE — Progress Notes (Signed)
    [facility] has been requested to perform a transesophageal echocardiogram on 05/26 for stroke.  After careful review of history and examination, the risks and benefits of transesophageal echocardiogram have been explained including risks of esophageal damage, perforation (1:10,000 risk), bleeding, pharyngeal hematoma as well as other potential complications associated with conscious sedation including aspiration, arrhythmia, respiratory failure and death. Alternatives to treatment were discussed, questions were answered. Patient is willing to proceed.   [name], [name] 07/24/2021 7:03 PM

## 2021-07-24 NOTE — Progress Notes (Addendum)
Subjective: The patient was seen at bedside during rounds this morning with son present at bedside.  The patient denies any complaints or concerns today, stating "I am doing just fine, no pain anywhere."  His son thinks that he has been doing a lot better since yesterday.  Patient denies any numbness or tingling.  No other complaints or concerns at this time.  Objective:  Vital signs in last 24 hours: Vitals:   07/23/21 1532 07/23/21 2126 07/23/21 2356 07/24/21 0336  BP: 140/77 (!) 127/56 122/68 127/64  Pulse: 92 76 75 80  Resp: '18 20 20 16  '$ Temp: 98.9 F (37.2 C) 98.8 F (37.1 C) 97.8 F (36.6 C) 98.1 F (36.7 C)  TempSrc: Oral Oral  Oral  SpO2: 100% 98% 98% 98%  Weight:      Height:       General appearance: NAD, normal appearance HEENT: Normocephalic and atraumatic Resp: Normal work of breathing on RA. Normal breath sounds Cardio: Regular rate and rhythm, no m/r/g GI: Bowel sounds normal and soft, non-tender, non-distended Extremities: No peripheral edema, legs appear equal in size Skin: Warm and dry Neuro: Patient with some difficulty following commands, but endorses no deficits with sensory testing. Psych: Mood and behavior are normal  Assessment/Plan:  Principal Problem:   Acute encephalopathy Active Problems:   HAP (hospital-acquired pneumonia)   Cerebral embolism with cerebral infarction  #Acute metabolic encephalopathy #Multifocal strokes c/f embolic source #Sepsis 2/2 HCAP Recently hospitalized here for CAP and discharged on Augmentin and doxycycline regimens. He presented here several days later with AMS in the setting of worsening right upper and middle lobe pneumonia, new onset unilateral weakness, and leg pain. CT head obtained here was unremarkable but MRI brain obtained overnight showed multifocal strokes in multiple vascular distributions, c/f an embolic source. TTE today shows an intraatrial shunt. Preliminary report from recent venous duplex LE shows an  acute DVT in the LLE.  His acute metabolic encephalopathy is likely multifactorial, 2/2 acute CVA and sepsis 2/2 HCAP. Neurology is on board and is recommending resumption of anticoagulation given concern for paradoxical emboli. Differential also includes infective endocarditis given that he was febrile on admit, so will proceed with TEE tomorrow to rule out a vegetation. Could also consider malignancy though no significant findings on recent CT chest/abd/pelvis with the exception of a 7 mm partially calcified LUL nodule. Otherwise, will continue IV antibiotics for tx of his PNA.  - Neurology is on board, appreciate their recommendations - Continue zosyn per pharmacy, appreciate their assistance - Start Eliquis 5 mg BID; patient is ok to take oral meds with puree, appreciate SLP's assistance - Blood cultures show NGTD - TEE planned for tomorrow, 05/26 - Telemetry, can likely discontinue at DC as this will not change our management - Delirium precautions - PT/OT recommending home health therapies   #RLL pulmonary embolism #Acute DVT of LLE CTA Chest with PE in lower lobe of right pulmonary artery. Preliminary report from venous duplex LE shows an acute DVT in the LLE. Risk factors include prolonged immobilization at home after recent infection (PNA); could also consider possible malignancy though recent imaging is overall reassuring. - Eliquis as above - Telemetry  #Physical deconditioning Patient has been immobile and weak since discharge from the hospital with poor oral intake.  - PT/OT recommending home health therapies - Boost shakes tid between meals    #Hyperglycemia HgA1c of 7.7 this admission. He does not take medications for diabetic management. - SSI with meals -  CBG monitoring - HbA1c   #CAD s/p CABG and multiple PCIs - Resumed home brillinta - Continue home crestor '20mg'$  daily    #Hypertension Systolics-normotensive, most recently in the 120s. - Holding home atenolol for  permissive HTN   #Incidental pulmonary nodules  Previously noted to have small 5-8m pulmonary nodules in lower lobes and recommended for follow up CT Chest in 6-12 months. CTA chest obtained during this admission with 764mpartially calcified LUL nodule. - Recommend outpatient follow up   Prior to Admission Living Arrangement: Home, living by himself Barriers to Discharge: Medical stability, TEE Dispo: Anticipated discharge pending medical stability, TEE  BoOrvis BrillMD 07/24/2021, 7:05 AM Pager: [phone] 5pm on weekdays and 1pm on weekends: On Call pager [phone]

## 2021-07-24 NOTE — Progress Notes (Signed)
Bilateral lower extremity venous duplex has been completed. Preliminary results can be found in CV Proc through chart review.  Results were given to the patient's nurse, Olene Craven.  07/24/21 1:07 PM Carlos Levering RVT

## 2021-07-24 NOTE — Progress Notes (Signed)
Speech Language Pathology Treatment: Dysphagia  Patient Details Name: Xavier Giles MRN: [id number] DOB: [birth year] Today's Date: 07/24/2021 Time: 6160-7371 SLP Time Calculation (min) (ACUTE ONLY): 13 min  Assessment / Plan / Recommendation Clinical Impression  Pt vomited yesterday after speech eval with concern for aspiration per RN and made NPO until Speech sees pt again. Today he is calmer and redirectible, dtr present and pt requesting water. Multiple straw sips thin consumed x 2 trials with swallow and respiratory coordination, no s/s aspiration nor with puree and solid. Did not initially want to donn dentures but agreeable when visualized cracker and encouragement from his daughter and therapist which he functionally masticated and propelled. He need assist and supervision with meals and therapist ordered regular texture (dentures donned), thin liquid, pills whole in puree and upright posture. ST will follow and initiate SLE.   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 Pt d/c'ed a week ago for pneumonia. Admitted now with family reports patient declined over the past few days, speaking less and weaker, needing more help with ADLs, feet are swollen. MRI acute posterior right frontal lobe and posterior insula with smaller areas of diffusion in bil frontal, parietal and occipital lobes and well as cerebellar hemispheres.Chest CT found PE, right upperl obe and right middle lobe infiltrate.      SLP Plan  Continue with current plan of care      Recommendations for follow up therapy are one component of a multi-disciplinary discharge planning process, led by the attending physician.  Recommendations may be updated based on patient status, additional functional criteria and insurance authorization.     Recommendations  Diet recommendations: Regular;Thin liquid Liquids provided via: Cup;Straw Medication Administration: Whole meds with puree Supervision: Full supervision/cueing for compensatory strategies;Staff to assist with self feeding Compensations: Slow rate;Small sips/bites;Minimize environmental distractions;Lingual sweep for clearance of pocketing Postural Changes and/or Swallow Maneuvers: Seated upright 90 degrees                General recommendations: Rehab consult Oral Care Recommendations: Oral care BID Follow Up Recommendations: Acute inpatient rehab (3hours/day) Assistance recommended at discharge: Frequent or constant Supervision/Assistance SLP Visit Diagnosis: Dysphagia, unspecified (R13.10) Plan: Continue with current plan of care           Houston Siren  07/24/2021, 1:25 PM

## 2021-07-24 NOTE — TOC CAGE-AID Note (Signed)
Transition of Care [facility]) - CAGE-AID Screening   Patient Details  Name: Xavier Giles MRN: [id number] Date of Birth: April 14, 1940  Transition of Care [facility]) CM/SW Contact:    Gaetano Hawthorne Tarpley-Carter, [place] Phone Number: 07/24/2021, 2:57 PM   Clinical Narrative: Pt participated in [place].  Pt stated he does not use substance or ETOH by shaking head no to answer questions.  Pt was not offered resources, due to no usage of substance or ETOH.    Dierdra Salameh Tarpley-Carter, MSW, LCSW-A Pronouns:  She/Her/Hers [facility] Clinical Social Worker Direct Number:  [phone] [email or web address]   CAGE-AID Screening:    Have You Ever Felt You Ought to Cut Down on Your Drinking or Drug Use?: No Have People Annoyed You By [employer] Your Drinking Or Drug Use?: No Have You Felt Bad Or Guilty About Your Drinking Or Drug Use?: No Have You Ever Had a Drink or Used Drugs First Thing In The Morning to Steady Your Nerves or to Get Rid of a Hangover?: No CAGE-AID Score: 0  Substance Abuse Education Offered: No

## 2021-07-24 NOTE — Progress Notes (Addendum)
[facility] for apixaban Indication: pulmonary embolus and DVT  Allergies  Allergen Reactions   Keflex [Cephalexin] Other (See Comments)    Unknown ;   Patient states he is not aware of being allergic to cephalexin per [facility] resident MD report.     Zocor [Simvastatin] Other (See Comments)    Unknown    Lipitor [Atorvastatin] Other (See Comments)    Elevated blood sugar   Sulfa Antibiotics Nausea Only    Patient Measurements: Height: '5\' 11"'$  (180.3 cm) Weight: 77.3 kg (170 lb 6.7 oz) IBW/kg (Calculated) : 75.3  Vital Signs: Temp: 97.8 F (36.6 C) (05/25 1318) Temp Source: Oral (05/25 1318) BP: 131/70 (05/25 1318) Pulse Rate: 92 (05/25 1318)  Labs: Recent Labs    07/22/21 1656 07/22/21 1736 07/22/21 2045 07/22/21 2347 07/23/21 0330 07/24/21 0138 07/24/21 1303  HGB 13.5 13.9  --   --  12.4* 13.3  --   HCT 40.9 41.0  --   --  37.9* 41.6  --   PLT 308  --   --   --  259 271  --   LABPROT 16.2*  --   --   --   --   --   --   INR 1.3*  --   --   --   --   --   --   HEPARINUNFRC  --   --   --   --   --  <0.10*  --   CREATININE 1.15  --   --   --  1.02 1.07  --   TROPONINIHS 143*  --  151* 134*  --   --  229*     Estimated Creatinine Clearance: 56.7 mL/min (by C-G formula based on SCr of 1.07 mg/dL).   Assessment: 81 yo M presenting with AMS, not on anticoagulation PTA. CT chest with mild amount of pulmonary embolism within a lower lobe branch of the right pulmonary artery; also with L DVT on duplex.  MRI shows scattered multifocal strokes likely secondary to an embolic source. Pharmacy consulted to begin apixaban.   Hgb stable in 13s, platelets are normal. No signs/symptoms of bleeding noted. Avoid apixaban load per discussion with Dr [name].   Plan:  Apixaban 5 mg PO bid Education prior to discharge Pharmacy signing off but will continue to follow peripherally - please re-consult if needed  Thank you for involving pharmacy in this  patient's care.  Renold Genta, PharmD, BCPS Clinical Pharmacist Clinical phone for 07/24/2021 until 3p is [phone] 07/24/2021 2:13 PM  **Pharmacist phone directory can be found on [email or web address] listed under [place]**

## 2021-07-24 NOTE — Evaluation (Signed)
Physical Therapy Evaluation Patient Details Name: Xavier Giles MRN: [id number] DOB: 1941/01/31 Today's Date: 07/24/2021  History of Present Illness  Pt is a 81 y/o male presenting on 5/23 with AMS. Admitted for sepsis due to CAP. Found with Right Lower Lobe pulmonary embolism. 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  Clinical Impression  Pt admitted with/for AMS, but found to have all the problems described above.  Fortunately, pt is mobilizing fairly well at min to min guard levels OOB.  Pt currently limited functionally due to the problems listed below.  (see problems list.)  Pt will benefit from PT to maximize function and safety to be able to get home safely with available assist.        Recommendations for follow up therapy are one component of a multi-disciplinary discharge planning process, led by the attending physician.  Recommendations may be updated based on patient status, additional functional criteria and insurance authorization.  Follow Up Recommendations Home health PT    Assistance Recommended at Discharge Intermittent Supervision/Assistance (initially with more or less help as needed base on family observation)  Patient can return home with the following  Assistance with cooking/housework;Direct supervision/assist for medications management;Direct supervision/assist for financial management;Assist for transportation;Help with stairs or ramp for entrance;A little help with walking and/or transfers;A little help with bathing/dressing/bathroom    Equipment Recommendations None recommended by PT (assess further next session.)  Recommendations for Other Services       Functional Status Assessment Patient has had a recent decline in their  functional status and demonstrates the ability to make significant improvements in function in a reasonable and predictable amount of time.     Precautions / Restrictions Precautions Precautions: Fall (lower fall risk)      Mobility  Bed Mobility Overal bed mobility: Needs Assistance Bed Mobility: Supine to Sit, Sit to Supine     Supine to sit: Modified independent (Device/Increase time) Sit to supine: Modified independent (Device/Increase time)        Transfers Overall transfer level: Needs assistance   Transfers: Sit to/from Stand Sit to Stand: Supervision, Modified independent (Device/Increase time)                Ambulation/Gait Ambulation/Gait assistance: Min guard, Min assist Gait Distance (Feet): 15 Feet (to bathroom, then 180 feet in the hall) Assistive device: None Gait Pattern/deviations: Step-through pattern Gait velocity: moderate Gait velocity interpretation: 1.31 - 2.62 ft/sec, indicative of limited community ambulator   General Gait Details: mostly steady with a few episodes of drift or mild stagger to recover balance.  Pt did not scan around his environment and likely did not have a good perception of his environment given poor peripheral vision and macular degeneration  Stairs            Wheelchair Mobility    Modified Rankin (Stroke Patients Only) Modified Rankin (Stroke Patients Only) Pre-Morbid Rankin Score: Slight disability Modified Rankin: Moderately severe disability     Balance Overall balance assessment: Needs assistance Sitting-balance support: No upper extremity supported, Feet supported Sitting balance-Leahy Scale: Fair (or better)     Standing balance support: No upper extremity supported, During functional activity Standing balance-Leahy Scale: Fair  Pertinent Vitals/Pain Pain Assessment Pain Assessment: No/denies pain Pain Intervention(s): Monitored during session     Home Living Family/patient expects to be discharged to:: Private residence Living Arrangements: Alone Available Help at Discharge: Family;Available 24 hours/day Type of Home: House Home Access: Stairs to enter   [employer] of Steps: 2   Home Layout: One level Home Equipment: None      Prior Function Prior Level of Function : Independent/Modified Independent;Driving               ADLs Comments: independent, driving short distances but granddaughter assists with larger errands, manages meds     Hand Dominance        Extremity/Trunk Assessment        Lower Extremity Assessment Lower Extremity Assessment: Overall WFL for tasks assessed (mild weakness seen mostly with gait.)    Cervical / Trunk Assessment Cervical / Trunk Assessment: Normal  Communication   Communication: HOH  Cognition Arousal/Alertness: Awake/alert Behavior During Therapy: WFL for tasks assessed/performed Overall Cognitive Status: Difficult to assess                                          General Comments General comments (skin integrity, edema, etc.): Due to pt's extreme HOH it was difficult to provide cues and direction moment by moment and thus hard to challenge pt's cognition and mobility. Pt's daughter was present to witness mobility and  was happy with his mobility.  She states that they (family) can step up supervision/assist as needed up to 24/7.    Exercises     Assessment/Plan    PT Assessment Patient needs continued PT services  PT Problem List Decreased strength;Decreased activity tolerance;Decreased balance;Decreased mobility;Decreased safety awareness;Decreased knowledge of use of DME       PT Treatment Interventions Gait training;Stair training;Functional mobility training;Therapeutic activities;Balance training;Patient/family education;Neuromuscular re-education;DME instruction    PT Goals (Current goals can be found in the Care Plan  section)  Acute Rehab PT Goals Patient Stated Goal: daughter thinks it best to work toward getting home as discharge destination. PT Goal Formulation: With patient/family Time For Goal Achievement: 08/07/21 Potential to Achieve Goals: Good    Frequency Min 3X/week     Co-evaluation               AM-PAC PT "6 Clicks" Mobility  Outcome Measure Help needed turning from your back to your side while in a flat bed without using bedrails?: None Help needed moving from lying on your back to sitting on the side of a flat bed without using bedrails?: None Help needed moving to and from a bed to a chair (including a wheelchair)?: A Little Help needed standing up from a chair using your arms (e.g., wheelchair or bedside chair)?: A Little Help needed to walk in hospital room?: A Little Help needed climbing 3-5 steps with a railing? : A Little 6 Click Score: 20    End of Session   Activity Tolerance: Patient tolerated treatment well Patient left: in bed;with call bell/phone within reach;with bed alarm set;with family/visitor present Nurse Communication: Mobility status PT Visit Diagnosis: Other abnormalities of gait and mobility (R26.89);Other symptoms and signs involving the nervous system (R29.898);Difficulty in walking, not elsewhere classified (R26.2)    Time: 8119-1478 PT Time Calculation (min) (ACUTE ONLY): 25 min   Charges:   PT Evaluation $PT Eval Moderate Complexity: 1 Mod  07/24/2021  Ginger Carne., PT Acute Rehabilitation Services [phone]  (pager) [phone]  (office)  Tessie Fass Delshawn Stech 07/24/2021, 1:08 PM

## 2021-07-24 NOTE — Progress Notes (Signed)
Echocardiogram 2D Echocardiogram has been performed.  Oneal Deputy Carlus Stay RDCS 07/24/2021, 9:39 AM

## 2021-07-24 NOTE — Progress Notes (Signed)
Attempted to obtain information from patient but he could not hear CM. He did agree for CM to call his family. CM has left voicemail for patients daughter.  TOC following.

## 2021-07-25 ENCOUNTER — Inpatient Hospital Stay (HOSPITAL_COMMUNITY): Payer: Medicare Other

## 2021-07-25 ENCOUNTER — Encounter (HOSPITAL_COMMUNITY)
Admission: EM | Disposition: A | Discharge status: Home Health | Payer: Self-pay | Source: Home / Self Care | Attending: Infectious Diseases

## 2021-07-25 ENCOUNTER — Encounter (HOSPITAL_COMMUNITY): Payer: Self-pay | Admitting: Infectious Diseases

## 2021-07-25 ENCOUNTER — Inpatient Hospital Stay (HOSPITAL_COMMUNITY): Payer: Medicare Other | Admitting: Registered Nurse

## 2021-07-25 ENCOUNTER — Other Ambulatory Visit (HOSPITAL_COMMUNITY): Payer: Medicare Other

## 2021-07-25 DIAGNOSIS — I25119 Atherosclerotic heart disease of native coronary artery with unspecified angina pectoris: Secondary | ICD-10-CM | POA: Diagnosis not present

## 2021-07-25 DIAGNOSIS — Q2112 Patent foramen ovale: Secondary | ICD-10-CM

## 2021-07-25 DIAGNOSIS — J189 Pneumonia, unspecified organism: Secondary | ICD-10-CM | POA: Diagnosis not present

## 2021-07-25 DIAGNOSIS — I252 Old myocardial infarction: Secondary | ICD-10-CM

## 2021-07-25 DIAGNOSIS — I639 Cerebral infarction, unspecified: Secondary | ICD-10-CM | POA: Diagnosis not present

## 2021-07-25 DIAGNOSIS — I10 Essential (primary) hypertension: Secondary | ICD-10-CM

## 2021-07-25 DIAGNOSIS — I6310 Cerebral infarction due to embolism of unspecified precerebral artery: Secondary | ICD-10-CM | POA: Diagnosis not present

## 2021-07-25 DIAGNOSIS — I340 Nonrheumatic mitral (valve) insufficiency: Secondary | ICD-10-CM

## 2021-07-25 DIAGNOSIS — G9340 Encephalopathy, unspecified: Secondary | ICD-10-CM | POA: Diagnosis not present

## 2021-07-25 DIAGNOSIS — Y95 Nosocomial condition: Secondary | ICD-10-CM | POA: Diagnosis not present

## 2021-07-25 HISTORY — PX: TEE WITHOUT CARDIOVERSION: SHX5443

## 2021-07-25 HISTORY — PX: BUBBLE STUDY: SHX6837

## 2021-07-25 LAB — CBC
HCT: 44 % (ref 39.0–52.0)
Hemoglobin: 14.4 g/dL (ref 13.0–17.0)
MCH: 28 pg (ref 26.0–34.0)
MCHC: 32.7 g/dL (ref 30.0–36.0)
MCV: 85.4 fL (ref 80.0–100.0)
Platelets: 269 10*3/uL (ref 150–400)
RBC: 5.15 MIL/uL (ref 4.22–5.81)
RDW: 13.8 % (ref 11.5–15.5)
WBC: 11.6 10*3/uL — ABNORMAL HIGH (ref 4.0–10.5)
nRBC: 0 % (ref 0.0–0.2)

## 2021-07-25 LAB — BASIC METABOLIC PANEL
Anion gap: 12 (ref 5–15)
BUN: 21 mg/dL (ref 8–23)
CO2: 19 mmol/L — ABNORMAL LOW (ref 22–32)
Calcium: 7.8 mg/dL — ABNORMAL LOW (ref 8.9–10.3)
Chloride: 105 mmol/L (ref 98–111)
Creatinine, Ser: 1 mg/dL (ref 0.61–1.24)
GFR, Estimated: >60 mL/min (ref >60)
Glucose, Bld: 139 mg/dL — ABNORMAL HIGH (ref 70–99)
Potassium: 3.6 mmol/L (ref 3.5–5.1)
Sodium: 136 mmol/L (ref 135–145)

## 2021-07-25 LAB — GLUCOSE, CAPILLARY
Glucose-Capillary: 127 mg/dL — ABNORMAL HIGH (ref 70–99)
Glucose-Capillary: 115 mg/dL — ABNORMAL HIGH (ref 70–99)
Glucose-Capillary: 108 mg/dL — ABNORMAL HIGH (ref 70–99)
Glucose-Capillary: 196 mg/dL — ABNORMAL HIGH (ref 70–99)

## 2021-07-25 SURGERY — ECHOCARDIOGRAM, TRANSESOPHAGEAL
Anesthesia: Monitor Anesthesia Care

## 2021-07-25 MED ORDER — POTASSIUM CHLORIDE 20 MEQ PO PACK
40.0000 meq | PACK | Freq: Once | ORAL | Status: AC
  Administered 2021-07-25: 40 meq via ORAL
  Filled 2021-07-25: qty 2

## 2021-07-25 MED ORDER — PROPOFOL 500 MG/50ML IV EMUL: INTRAVENOUS | Status: DC | PRN

## 2021-07-25 MED ORDER — PROPOFOL 10 MG/ML IV BOLUS
INTRAVENOUS | Status: DC | PRN
  Administered 2021-07-25: 100 ug/kg/min via INTRAVENOUS

## 2021-07-25 MED ORDER — PROPOFOL 500 MG/50ML IV EMUL
INTRAVENOUS | Status: DC | PRN
  Administered 2021-07-25: 30 mg via INTRAVENOUS
  Administered 2021-07-25: 20 mg via INTRAVENOUS

## 2021-07-25 NOTE — Anesthesia Preprocedure Evaluation (Signed)
Anesthesia Evaluation  Patient identified by MRN, date of birth, ID band Patient awake    Reviewed: Allergy & Precautions, H&P , NPO status , Patient's Chart, lab work & pertinent test results  Airway Mallampati: II   Neck ROM: full    Dental   Pulmonary asthma , former smoker,    breath sounds clear to auscultation       Cardiovascular hypertension, + angina + CAD, + Past MI, + Cardiac Stents, + CABG and + Peripheral Vascular Disease   Rhythm:regular Rate:Normal  TTE: EF 40-45%, PFO present.   Neuro/Psych CVA    GI/Hepatic   Endo/Other    Renal/GU      Musculoskeletal   Abdominal   Peds  Hematology   Anesthesia Other Findings   Reproductive/Obstetrics                             Anesthesia Physical Anesthesia Plan  ASA: 4  Anesthesia Plan: MAC   Post-op Pain Management:    Induction: Intravenous  PONV Risk Score and Plan: 1 and Propofol infusion and Treatment may vary due to age or medical condition  Airway Management Planned: Nasal Cannula  Additional Equipment:   Intra-op Plan:   Post-operative Plan:   Informed Consent: I have reviewed the patients History and Physical, chart, labs and discussed the procedure including the risks, benefits and alternatives for the proposed anesthesia with the patient or authorized representative who has indicated his/her understanding and acceptance.     Dental advisory given  Plan Discussed with: CRNA, Anesthesiologist and Surgeon  Anesthesia Plan Comments:         Anesthesia Quick Evaluation

## 2021-07-25 NOTE — Progress Notes (Signed)
Subjective: The patient was seen at bedside during rounds this morning. No one at bedside. States that he is not having any pain at this time. Frustrated not being able to go home today but was amenable to staying. No other complaints or concerns at this time.  Objective:  Vital signs in last 24 hours: Vitals:   07/24/21 1525 07/24/21 1911 07/24/21 2315 07/25/21 0420  BP: 119/78 117/66 104/62 136/76  Pulse: 81 (!) 46 96 (!) 51  Resp: '19  18 19  '$ Temp: 97.9 F (36.6 C) 97.8 F (36.6 C) 97.8 F (36.6 C) 98.6 F (37 C)  TempSrc: Oral Oral  Oral  SpO2: 95% 95% 97% 96%  Weight:      Height:       General appearance: NAD, normal appearance HEENT: Normocephalic and atraumatic Resp: Normal work of breathing on RA. Normal breath sounds Cardio: Regular rate and rhythm, no m/r/g GI: Bowel sounds normal and soft, non-tender, non-distended Extremities: No peripheral edema, legs appear equal in size Skin: Warm and dry Neuro: Alert and oriented to situation. Limited exam due to difficulty following commands in the setting of his AMS vs difficulty hearing. Psych: Mood and behavior are normal  Assessment/Plan:  Principal Problem:   Acute encephalopathy Active Problems:   HAP (hospital-acquired pneumonia)   Cerebral embolism with cerebral infarction  #Acute metabolic encephalopathy #Multifocal strokes c/f embolic source #Sepsis 2/2 HCAP His acute metabolic encephalopathy is likely multifactorial, 2/2 acute CVA and sepsis 2/2 HCAP. Neurology is on board and is recommending continued anticoagulation given concern for paradoxical emboli. Differential also includes infective endocarditis given that he was febrile on admit, so will proceed with TEE later today to rule out a vegetation. Could also consider malignancy though no significant findings on recent CT chest/abd/pelvis with the exception of a 7 mm partially calcified LUL nodule. Otherwise, will continue IV antibiotics for tx of his PNA  (on day 4/7).   - Neurology is on board, appreciate their recommendations - Continue zosyn per pharmacy, appreciate their assistance, currently on day 4/7 - Continue Eliquis 5 mg BID; patient is ok to take oral meds with puree, appreciate SLP's assistance - Blood cultures show NGTD - TEE planned for today, 05/26 - Telemetry, can likely discontinue at DC as this will not change our management - Delirium precautions - PT/OT recommending home health  #RLL pulmonary embolism #Acute DVT of LLE CTA Chest with PE in lower lobe of right pulmonary artery. Report from venous duplex LE shows an acute DVT in the LLE. Risk factors include prolonged immobilization at home after recent infection (PNA); could also consider possible malignancy though recent imaging is overall reassuring. - Eliquis as above - Telemetry  #Physical deconditioning Patient has been immobile and weak since discharge from the hospital with poor oral intake.  - PT/OT recommending home health therapies - Boost shakes tid between meals   #Hyperglycemia HgA1c of 7.7 this admission. He does not take medications for diabetic management. - SSI with meals - CBG monitoring   #CAD s/p CABG and multiple PCIs Patient is on brillinta and crestor at home. At this time, will hold off on brillinta given that he is now on Eliquis. - Continue home crestor '20mg'$  daily  - Holding home brillinta  #Hypertension Systolics-normotensive, most recently in the 110s-120s. - Holding home atenolol  #Incidental pulmonary nodules  Previously noted to have small 5-27m pulmonary nodules in lower lobes and recommended for follow up CT Chest in 6-12 months. CTA chest obtained  during this admission with 26m partially calcified LUL nodule. - Recommend outpatient follow up   Prior to Admission Living Arrangement: Home, living by himself Barriers to Discharge: Medical stability, TEE Dispo: Anticipated discharge pending medical stability, TEE  BOrvis Brill MD 07/25/2021, 6:36 AM Pager: [phone] After 5pm on weekdays and 1pm on weekends: On Call pager [phone]

## 2021-07-25 NOTE — Progress Notes (Signed)
  Echocardiogram Echocardiogram Transesophageal has been performed.  Xavier Giles 07/25/2021, 3:11 PM

## 2021-07-25 NOTE — CV Procedure (Signed)
Brief TEE Note  LVEF 45-50% Mild global hypokinesis No LA/LAA thrombus or masses Mild mitral regurgitation Trivial aortic regurgitation and pulmonic regurgitation Interatrial septum is aneurysmal.  +saline microcavitation study with PFO.  Early right to left shunting at rest  For additional details see full report.  [place] Merida C. Oval Linsey, MD, [facility] 07/25/2021 2:38 PM

## 2021-07-25 NOTE — Discharge Summary (Signed)
Name: Xavier Giles MRN: [id number] DOB: 06-24-1940 81 y.o. PCP: [name], MD  Date of Admission: 07/22/2021  4:16 PM Date of Discharge: No discharge date for patient encounter.*** Attending Physician: Campbell Riches, MD  Discharge Diagnosis: 1. ***  Discharge Medications: Allergies as of 07/25/2021       Reactions   Keflex [cephalexin] Other (See Comments)   Unknown ;   Patient states he is not aware of being allergic to cephalexin per [facility] resident MD report.     Zocor [simvastatin] Other (See Comments)   Unknown    Lipitor [atorvastatin] Other (See Comments)   Elevated blood sugar   Sulfa Antibiotics Nausea Only     Med Rec must be completed prior to using this [place]***       Disposition and follow-up:   Mr.Terel E Postell was discharged from [facility] in Stable condition.  At the hospital follow up visit please address:  1.  ***  2.  Labs / imaging needed at time of follow-up: ***  3.  Pending labs/ test needing follow-up: ***  Follow-up Appointments:   Hospital Course by problem list: #Acute metabolic encephalopathy #Multifocal strokes c/f embolic source #Sepsis 2/2 HCAP Recently hospitalized here for CAP and discharged on Augmentin and doxycycline regimens. He presented here several days later with AMS in the setting of worsening right upper and middle lobe pneumonia, new onset unilateral weakness, and leg pain. He was initially placed on cefepime and given vanc x 1 for sepsis 2/2 CAP. Blood cultures showed NGTD. CT head obtained here was unremarkable. The next day, he was transitioned to zosyn for presumed HCAP. MRI brain obtained the following night showed multifocal strokes in multiple vascular distributions, c/f an embolic source. When MRI resulted, IV heparin was held by Neurology due to concern for hemorrhagic transformation however the next day pt was started back on po eliquis. TTE was performed on 05/25 showed an  intraatrial shunt. Report from recent venous duplex LE shows an acute DVT in the LLE. TEE on 05/26 showed a PFO but no valvular issues or vegetations.  *** His acute metabolic encephalopathy is likely multifactorial, 2/2 acute CVA and sepsis 2/2 HCAP. Neurology is on board and is recommending resumption of anticoagulation given concern for paradoxical emboli. Differential also includes infective endocarditis given that he was febrile on admit, so will proceed with TEE tomorrow to rule out a vegetation. Could also consider malignancy though no significant findings on recent CT chest/abd/pelvis with the exception of a 7 mm partially calcified LUL nodule. Otherwise, will continue IV antibiotics for tx of his PNA. PT/OT recommending home health therapies.  #RLL pulmonary embolism #Acute DVT of LLE CTA Chest with PE in lower lobe of right pulmonary artery. Pt was initially placed on lovenox followed by IV heparin. After MRI brain showed multifocal infarcts, IV heparin was stopped due to concern for hemorrhagic transformation in the setting of possible endocarditis. The following day, report from venous duplex LE showed an acute DVT in the LLE. He was subsequently placed on oral eliquis regimen. PE and DVT thought to be due to prolonged immobilization at home after recent infection (PNA); could also consider possible malignancy though recent imaging is overall reassuring. ***   #Physical deconditioning Patient has been immobile and weak since discharge from the hospital with poor oral intake. PT/OT evaluated the patient and recommended home health therapies. Also given boost shakes TID between meals.   #Hyperglycemia HgA1c of 7.7 this admission.  He does not take medications for diabetic management. During this admission, his CBGs were monitored and he was placed on SSI regimen while inpatient.   #CAD s/p CABG and multiple PCIs His home brillinta was held briefly but otherwise continued throughout his  admission. He was continued on his home crestor here.    #Hypertension Systolics-normotensive, most recently in the 120s. - Holding home atenolol for permissive HTN   #Incidental pulmonary nodules  Previously noted to have small 5-52m pulmonary nodules in lower lobes and recommended for follow up CT Chest in 6-12 months. CTA chest obtained during this admission with 773mpartially calcified LUL nodule. - Recommend outpatient follow up    Discharge Exam:   BP 123/80 (BP Location: Left Arm)   Pulse 97   Temp 97.6 F (36.4 C) (Oral)   Resp 16   Ht '5\' 11"'$  (1.803 m)   Wt 77.3 kg   SpO2 94%   BMI 23.77 kg/m  Discharge exam:  General: NAD, nl appearance HE: Normocephalic, atraumatic, EOMI, Conjunctivae normal ENT: No congestion, no rhinorrhea, no exudate or erythema  Cardiovascular: Normal rate, regular rhythm. No murmurs, rubs, or gallops Pulmonary: Effort normal, breath sounds normal. No wheezes, rales, or rhonchi Abdominal: soft, nontender, bowel sounds present Musculoskeletal: no swelling, deformity, injury or tenderness in extremities Skin: Warm, dry, no bruising, erythema, or rash Psychiatric/Behavioral: normal mood, normal behavior   ***  Pertinent Labs, Studies, and Procedures:  ***  Discharge Instructions:   Signed: BoOrvis BrillMD 07/25/2021, 8:17 AM   Pager: [phone]

## 2021-07-25 NOTE — Progress Notes (Signed)
PT Cancellation Note  Patient Details Name: Xavier Giles MRN: [id number] DOB: 04-01-1940   Cancelled Treatment:    Reason Eval/Treat Not Completed: Patient at procedure or test/unavailable.  Pt gone for a TEE on arrival, may not be able to return, but will try back as able. 07/25/2021  Xavier Carne., PT Acute Rehabilitation Services [phone]  (pager) [phone]  (office)   Xavier Giles [name] 07/25/2021, 5:28 PM

## 2021-07-25 NOTE — Progress Notes (Signed)
OT Cancellation Note  Patient Details Name: Xavier Giles MRN: [id number] DOB: 26-Sep-1940   Cancelled Treatment:    Reason Eval/Treat Not Completed: Patient at procedure or test/ unavailable (ECHO.will return as schedule allows.)  [name] 07/25/2021, 1:34 PM

## 2021-07-25 NOTE — Progress Notes (Addendum)
STROKE TEAM PROGRESS NOTE   INTERVAL HISTORY His daughter is at the bedside.  Afebrile  , WBC 11.6. TEE is scheduled for this afternoon.   Lower extremity venous Dopplers are negative for DVT.  Neurological exam unchanged.  Vital signs stable.  Blood cultures are negative so far Vitals:   07/24/21 1911 07/24/21 2315 07/25/21 0420 07/25/21 0741  BP: 117/66 104/62 136/76 123/80  Pulse: (!) 46 96 (!) 51 97  Resp:  '18 19 16  '$ Temp: 97.8 F (36.6 C) 97.8 F (36.6 C) 98.6 F (37 C) 97.6 F (36.4 C)  TempSrc: Oral  Oral Oral  SpO2: 95% 97% 96% 94%  Weight:      Height:       CBC:  Recent Labs  Lab 07/22/21 1656 07/22/21 1736 07/24/21 0138 07/25/21 0759  WBC 10.0   < > 11.8* 11.6*  NEUTROABS 8.0*  --   --   --   HGB 13.5   < > 13.3 14.4  HCT 40.9   < > 41.6 44.0  MCV 85.6   < > 86.8 85.4  PLT 308   < > 271 269   < > = values in this interval not displayed.   Basic Metabolic Panel:  Recent Labs  Lab 07/24/21 0138 07/25/21 0759  NA 132* 136  K 4.0 3.6  CL 102 105  CO2 22 19*  GLUCOSE 148* 139*  BUN 21 21  CREATININE 1.07 1.00  CALCIUM 8.1* 7.8*   Lipid Panel: No results for input(s): CHOL, TRIG, HDL, CHOLHDL, VLDL, LDLCALC in the last 168 hours. HgbA1c:  Recent Labs  Lab 07/23/21 0330  HGBA1C 7.7*   Urine Drug Screen:  Recent Labs  Lab 07/22/21 1651  LABOPIA NONE DETECTED  COCAINSCRNUR NONE DETECTED  LABBENZ NONE DETECTED  AMPHETMU NONE DETECTED  THCU NONE DETECTED  LABBARB NONE DETECTED    Alcohol Level  Recent Labs  Lab 07/22/21 1656  ETH <10    IMAGING past 24 hours VAS [place] LOWER EXTREMITY VENOUS (DVT)  Result Date: 07/24/2021  Lower Venous DVT Study Patient Name:  IHAN PAT  Date of Exam:   07/24/2021 Medical Rec #: [id number]       Accession #:    [id number] Date of Birth: [birth year]      Patient Gender: M Patient Age:   81 years Exam Location:  [facility] Procedure:      VAS [place] LOWER EXTREMITY VENOUS (DVT) Referring Phys: JEFFREY  HATCHER --------------------------------------------------------------------------------  Indications: Pulmonary embolism.  Risk Factors: None identified. Comparison Study: No prior studies. Performing Technologist: Oliver Hum RVT  Examination Guidelines: A complete evaluation includes B-mode imaging, spectral Doppler, color Doppler, and power Doppler as needed of all accessible portions of each vessel. Bilateral testing is considered an integral part of a complete examination. Limited examinations for reoccurring indications may be performed as noted. The reflux portion of the exam is performed with the patient in reverse Trendelenburg.  +---------+---------------+---------+-----------+----------+--------------+ RIGHT    CompressibilityPhasicitySpontaneityPropertiesThrombus Aging +---------+---------------+---------+-----------+----------+--------------+ CFV      Full           Yes      Yes                                 +---------+---------------+---------+-----------+----------+--------------+ SFJ      Full                                                        +---------+---------------+---------+-----------+----------+--------------+  FV Prox  Full                                                        +---------+---------------+---------+-----------+----------+--------------+ FV Mid   Full                                                        +---------+---------------+---------+-----------+----------+--------------+ FV DistalFull                                                        +---------+---------------+---------+-----------+----------+--------------+ PFV      Full                                                        +---------+---------------+---------+-----------+----------+--------------+ POP      Full           Yes      Yes                                 +---------+---------------+---------+-----------+----------+--------------+ PTV       Full                                                        +---------+---------------+---------+-----------+----------+--------------+ PERO     Full                                                        +---------+---------------+---------+-----------+----------+--------------+   +---------+---------------+---------+-----------+----------+--------------+ LEFT     CompressibilityPhasicitySpontaneityPropertiesThrombus Aging +---------+---------------+---------+-----------+----------+--------------+ CFV      Full           Yes      Yes                                 +---------+---------------+---------+-----------+----------+--------------+ SFJ      Full                                                        +---------+---------------+---------+-----------+----------+--------------+ FV Prox  Full                                                        +---------+---------------+---------+-----------+----------+--------------+  FV Mid   Full                                                        +---------+---------------+---------+-----------+----------+--------------+ FV DistalFull                                                        +---------+---------------+---------+-----------+----------+--------------+ PFV      Full                                                        +---------+---------------+---------+-----------+----------+--------------+ POP      Full           Yes      Yes                                 +---------+---------------+---------+-----------+----------+--------------+ PTV      Full                                                        +---------+---------------+---------+-----------+----------+--------------+ PERO     Partial                                      Acute          +---------+---------------+---------+-----------+----------+--------------+     Summary: RIGHT: - There is no evidence of deep  vein thrombosis in the lower extremity.  - No cystic structure found in the popliteal fossa.  LEFT: - Findings consistent with acute deep vein thrombosis involving the left peroneal veins. - No cystic structure found in the popliteal fossa.  *See table(s) above for measurements and observations. Electronically signed by Jamelle Haring on 07/24/2021 at 4:53:10 PM.    Final     PHYSICAL EXAM  Physical Exam  Constitutional: Appears well-developed and well-nourished pleasant elderly gentleman who is extremely hard of hearing.  Cardiovascular: Normal rate and regular rhythm.  Respiratory: Effort normal, non-labored breathing  Neuro: Mental Status: Patient is awake and visually tracks but due to his significant decreased hearing, does not clearly follow verbal commands well but mimics.  Yells "I am okay, okay" but otherwise does not engage with examiner Cranial Nerves: II: No clear blink to threat. Pupils are round and reactive to light bilaterally III,IV, VI: Tracks examiner in bilaterally.  Diminished right eye peripheral visual field and baseline. V: Facial sensation is symmetric to light eyelash brush VII: Facial movement is symmetric.  VIII: hearing is intact to voice (hard of hearing at baseline, but does react to examiner's voice) Remainder unable to assess given mental status Motor: He is able to maintain bilateral upper extremities antigravity without drift, though both arms pronate slightly.  Both lower extremities are  at least 2/5, symmetric Sensory: Equally reactive to touch in all 4 extremities Deep Tendon Reflexes: 2+ and symmetric in the biceps Cerebellar: Unable to assess given mental status Gait:  Deferred    ASSESSMENT/PLAN Mr. AGUSTUS MANE is a 80 y.o. male with history of CAD s/p CABG and multiple PCI stents, hypertension, hyperlipidemia and recently admitted for community acquired pneumonia who presents to [facility] with altered mental status and fever. MRI shows scattered  multifocal strokes. Venous duplex and TEE are pending. 2D echo shows intracardiac shunt, positive bubble study but no evidence of vegetation.  CTA shows multifocal stenosis in the vertebral arteries bilaterally. SLP re- eval pending after concern for aspiration yesterday.    Stroke:  multifocal strokes in the bilateral frontal, parietal, and occipital with the largest in the left parietal lobe infarct likely secondary to central embolic source, concern for endocarditis versus paradoxical embolism given finding of PFO and pulmonary embolism concurrently Code Stroke CT head No acute abnormality. Small vessel disease. Atrophy.  CTA head & neck -50% stenosis of the proximal right ICA, 50% stenosis of the origin of the brachiocephalic artery, 24% stenosis at the origin of the left common carotid artery, and 40% stenosis of the proximal left subclavian artery. Multifocal narrowing in the bilateral vertebral arteries, which is severe in the mid left V1, and moderate in the left V2 and at the origin of the right vertebral artery. Moderate to severe stenosis in the bilateral cavernous carotids and mild-to-moderate stenosis of bilateral supraclinoid segments. Additional moderate to severe stenosis in the proximal right V4, mid to distal left V4, and right P2.  MRI  Moderate area of acute infarct involving the posterior right frontal lobe and posterior insula, with additional smaller areas of restricted diffusion in the bilateral frontal, parietal, and occipital lobes, as well as the cerebellar hemispheres and vermis. Venous duplex lower extremity pending 2D Echo Agitated saline contrast was given intravenously to evaluate for intracardiac shunting. Agitated saline contrast bubble study was positive  -EF 40-45%, global hypokinesis.   EEG : Abnormal mitral valve mild to moderate diffuse encephalopathy, nonspecific etiology. No seizures or epileptiform discharges were seen throughout the recording. LDL 45 HgbA1c  7.7 VTE prophylaxis - SCDs    Diet   Diet NPO time specified   Brilinta (ticagrelor) 90 mg bid prior to admission, now on Eliquis for pulm embolism Therapy recommendations:  Home OT & PT Disposition:  Home  Hypertension Home meds:  Lotrel, atenolol, Imdur Stable Permissive hypertension (OK if < 220/120) but gradually normalize in 5-7 days [place]-term BP goal normotensive  Hyperlipidemia Home meds: Rosuvastatin, resumed in hospital LDL 45, goal < 70 Continue statin at discharge  Pulmonary Embolism  PE in lower lobe of right pulmonary artery  Sepsis secondary to CAP with hypoxic respiratory failure Acute metabolic encephalopathy  Diabetes type II Uncontrolled Home meds: None HgbA1c 7.7, goal < 7.0 CBGs Recent Labs    07/24/21 1007 07/24/21 1713 07/25/21 0629  GLUCAP 151* 151* 127*    SSI  Other Stroke Risk Factors Advanced Age >/= 38  Hx stroke/TIA CAD s/p CABG x5, multiple PCI's Home meds: Imdur, Brilinta, Crestor Congestive heart failure EF 40-45% Home med-Imdur  Other Active Problems Confusion/Agitation  One time dose of haldol from primary team Frequent reorientation, delirium precautions.   Hospital day # 3     Patient presented with intermittent confusion and right upper extremity weakness and MRI scan shows multifocal bicerebral embolic infarcts likely from a central source.  He has  been found to be febrile raising concerns for suspected endocarditis and also have concurrent pulmonary embolism and now 2D echo shows atrial level shunt raising possibility of paradoxical embolism.  Recommend anticoagulation with Eliquis for pulmonary embolism and check TEE for vegetations and blood cultures.  May discharge home after TEE results.  Does not need prolonged cardiac monitoring at discharge as he is going to be on anticoagulation in the short-term at least so findings will not change treatment.  I had a [place] discussion patient and daughter and answered questions.   .  Greater than 50% time during this 35-minute visit was spent in counseling and coordination of care about his stroke, suspected endocarditis and discussion patient care team answering questions.  Stroke team will sign off.  Kindly call for questions ADDENDUM : TEE results noted.  No evidence of vegetations or endocarditis.  PFO confirmed.  Patient is not a candidate for endovascular PFO closure at this time due to his advanced age and low ROPE score. Antony Contras, MD Medical Director [place] Pager: [phone] 07/25/2021 11:29 AM  To contact Stroke Continuity provider, please refer to [email or web address] After hours, contact General Neurology

## 2021-07-25 NOTE — Transfer of Care (Signed)
Immediate Anesthesia Transfer of Care Note  Patient: Xavier Giles  Procedure(s) Performed: TRANSESOPHAGEAL ECHOCARDIOGRAM (TEE) BUBBLE STUDY  Patient Location: PACU and Endoscopy Unit  Anesthesia Type:MAC  Level of Consciousness: awake and patient cooperative  Airway & Oxygen Therapy: Patient connected to nasal cannula oxygen  Post-op Assessment: Report given to RN  Post vital signs: Reviewed  Last Vitals:  Vitals Value Taken Time  BP    Temp    Pulse    Resp    SpO2      Last Pain:  Vitals:   07/25/21 1324  TempSrc: Oral  PainSc: 0-No pain         Complications: No notable events documented.

## 2021-07-25 NOTE — Care Management Important Message (Signed)
Important Message  Patient Details  Name: Xavier Giles MRN: [id number] Date of Birth: [birth year]   Medicare Important Message Given:  Yes     Orbie Pyo 07/25/2021, 3:16 PM

## 2021-07-25 NOTE — Anesthesia Procedure Notes (Signed)
Procedure Name: MAC Date/Time: 07/25/2021 2:09 PM Performed by: Anastasio Auerbach, CRNA Pre-anesthesia Checklist: Patient identified, Emergency Drugs available, Suction available, Patient being monitored and Timeout performed Patient Re-evaluated:Patient Re-evaluated prior to induction Oxygen Delivery Method: Nasal cannula Induction Type: IV induction Airway Equipment and Method: Patient positioned with wedge pillow and Bite block Dental Injury: Teeth and Oropharynx as per pre-operative assessment

## 2021-07-25 NOTE — H&P (Signed)
Xavier Giles is a 81 y.o. male who has presented today for surgery, with the diagnosis of stroke.  The various methods of treatment have been discussed with the patient and family. After consideration of risks, benefits and other options for treatment, the patient has consented to  Procedure(s): TRANSESOPHAGEAL ECHOCARDIOGRAM (TEE) (N/A) as a surgical intervention .  The patient's history has been reviewed, patient examined, no change in status, stable for surgery.  I have reviewed the patient's chart and labs.  Questions were answered to the patient's satisfaction.    Donika Butner C. Oval Linsey, MD, [facility]  07/25/2021 2:08 PM

## 2021-07-25 NOTE — TOC Initial Note (Signed)
Transition of Care [facility]) - Initial/Assessment Note    Patient Details  Name: Xavier Giles MRN: [id number] Date of Birth: Aug 23, 1940  Transition of Care [facility]) CM/SW Contact:    Geralynn Ochs, LCSW Phone Number: 07/25/2021, 2:05 PM  Clinical Narrative:            CSW met with patient's daughter/POA [name] at bedside to discuss recommendation for home health. Daughter in agreement, patient's son will be moving in with the patient to help care for him. CSW answered questions in regards to home health expectations and duration. Alvis Lemmings accepted referral for PT, OT, SLP, and aide. CSW to follow for any additional needs prior to discharge.       Expected Discharge Plan: [place] Barriers to Discharge: Continued Medical Work up   Patient Goals and CMS Choice Patient states their goals for this hospitalization and ongoing recovery are:: to get home CMS Medicare.gov Compare Post Acute Care list provided to:: Patient Represenative (must comment) Choice offered to / list presented to : Adult Children  Expected Discharge Plan and Services Expected Discharge Plan: [place] Choice: [place] arrangements for the past 2 months: Single Family Home                           HH Arranged: OT, PT, Nurse's Aide, Speech Therapy HH Agency: [place] Date [place]: 07/25/21   Representative spoke with at [place]: Cindie  Prior Living Arrangements/Services Living arrangements for the past 2 months: [place] with:: Self, Adult Children Patient language and need for interpreter reviewed:: No Do you feel safe going back to the place where you live?: Yes      Need for Family Participation in Patient Care: Yes (Comment) Care giver support system in place?: Yes (comment)   Criminal Activity/Legal Involvement Pertinent to Current Situation/Hospitalization: No - Comment as  needed  Activities of Daily Living      Permission Sought/Granted Permission sought to share information with : Family Supports Permission granted to share information with : Yes, Verbal Permission Granted  Share Information with NAME: [name]     Permission granted to share info w Relationship: Daughter     Emotional Assessment Appearance:: Appears stated age Attitude/Demeanor/Rapport: Unable to Assess Affect (typically observed): Unable to Assess Orientation: : Oriented to Self Alcohol / Substance Use: Not Applicable Psych Involvement: No (comment)  Admission diagnosis:  Elevated troponin [R77.8] Acute encephalopathy [G93.40] HAP (hospital-acquired pneumonia) [J18.9, Y95] Altered mental status, unspecified altered mental status type [R41.82] Pulmonary embolism, unspecified chronicity, unspecified pulmonary embolism type, unspecified whether acute cor pulmonale present [facility]) [I26.99] Patient Active Problem List   Diagnosis Date Noted   Cerebral embolism with cerebral infarction 07/23/2021   Elevated troponin    Pulmonary embolism ([place])    Acute encephalopathy 07/22/2021   HAP (hospital-acquired pneumonia) 07/16/2021   New onset right bundle branch block (RBBB) 05/27/2021   Stable angina ([place]) 07/07/2016   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   Encounter for [place]-term (current) use of other medications 10/28/2012   Coronary artery disease involving coronary bypass graft of native heart with angina pectoris (HCC)    S/P CABG x 3    Presence of drug coated stent in SVG-OM    Dyslipidemia, goal LDL below 70    History of: NSTEMI (non-ST elevated myocardial infarction) 01/13/2012    Class: History  of   CAD, multiple vessel;  CABG in 1998; CTO SVG-RCA & SVG-Diag, multiple PCI SVG-OM, patent LIMA-LAD with dLAD 80% 01/13/2012   Essential hypertension 01/13/2012   PCP:  [name], MD Pharmacy:   [place] ([place]), [place] - [address] [place] [address] Phone: [phone] Fax: [phone]  [id number] [place] [place] [address] [id number]: [phone]: [phone]    Social Determinants of Health (SDOH) Interventions    Readmission Risk Interventions     View : No data to display.

## 2021-07-26 LAB — GLUCOSE, CAPILLARY
Glucose-Capillary: 181 mg/dL — ABNORMAL HIGH (ref 70–99)
Glucose-Capillary: 127 mg/dL — ABNORMAL HIGH (ref 70–99)
Glucose-Capillary: 178 mg/dL — ABNORMAL HIGH (ref 70–99)

## 2021-07-26 LAB — BASIC METABOLIC PANEL
Anion gap: 11 (ref 5–15)
BUN: 21 mg/dL (ref 8–23)
CO2: 20 mmol/L — ABNORMAL LOW (ref 22–32)
Calcium: 7.7 mg/dL — ABNORMAL LOW (ref 8.9–10.3)
Chloride: 102 mmol/L (ref 98–111)
Creatinine, Ser: 1.02 mg/dL (ref 0.61–1.24)
GFR, Estimated: >60 mL/min (ref >60)
Glucose, Bld: 191 mg/dL — ABNORMAL HIGH (ref 70–99)
Potassium: 3.7 mmol/L (ref 3.5–5.1)
Sodium: 133 mmol/L — ABNORMAL LOW (ref 135–145)

## 2021-07-26 LAB — CBC
HCT: 43.5 % (ref 39.0–52.0)
Hemoglobin: 14.5 g/dL (ref 13.0–17.0)
MCH: 27.9 pg (ref 26.0–34.0)
MCHC: 33.3 g/dL (ref 30.0–36.0)
MCV: 83.8 fL (ref 80.0–100.0)
Platelets: 291 10*3/uL (ref 150–400)
RBC: 5.19 MIL/uL (ref 4.22–5.81)
RDW: 13.6 % (ref 11.5–15.5)
WBC: 13.5 10*3/uL — ABNORMAL HIGH (ref 4.0–10.5)
nRBC: 0 % (ref 0.0–0.2)

## 2021-07-26 LAB — MAGNESIUM: Magnesium: 2.2 mg/dL (ref 1.7–2.4)

## 2021-07-26 LAB — HEPATITIS PANEL, ACUTE
HCV Ab: NONREACTIVE
Hep A IgM: NONREACTIVE
Hep B C IgM: NONREACTIVE
Hepatitis B Surface Ag: NONREACTIVE

## 2021-07-26 LAB — HEPATIC FUNCTION PANEL
ALT: 119 U/L — ABNORMAL HIGH (ref 0–44)
AST: 61 U/L — ABNORMAL HIGH (ref 15–41)
Albumin: 2.1 g/dL — ABNORMAL LOW (ref 3.5–5.0)
Alkaline Phosphatase: 94 U/L (ref 38–126)
Bilirubin, Direct: 0.2 mg/dL (ref 0.0–0.2)
Indirect Bilirubin: 0.5 mg/dL (ref 0.3–0.9)
Total Bilirubin: 0.7 mg/dL (ref 0.3–1.2)
Total Protein: 5.3 g/dL — ABNORMAL LOW (ref 6.5–8.1)

## 2021-07-26 LAB — HEPATITIS B SURFACE ANTIGEN: Hepatitis B Surface Ag: NONREACTIVE

## 2021-07-26 MED ORDER — IPRATROPIUM-ALBUTEROL 0.5-2.5 (3) MG/3ML IN SOLN
3.0000 mL | RESPIRATORY_TRACT | Status: DC
  Administered 2021-07-26: 3 mL via RESPIRATORY_TRACT
  Filled 2021-07-26: qty 3

## 2021-07-26 MED ORDER — AMOXICILLIN-POT CLAVULANATE 875-125 MG PO TABS: 1.0000 | ORAL_TABLET | Freq: Two times a day (BID) | ORAL | 0 refills | Status: AC

## 2021-07-26 MED ORDER — APIXABAN 5 MG PO TABS
5.0000 mg | ORAL_TABLET | Freq: Two times a day (BID) | ORAL | 2 refills | Status: DC
Start: 2021-07-26 — End: 2021-07-31

## 2021-07-26 MED ORDER — ATENOLOL 25 MG PO TABS
50.0000 mg | ORAL_TABLET | Freq: Every day | ORAL | Status: DC
  Administered 2021-07-26: 50 mg via ORAL
  Filled 2021-07-26: qty 2

## 2021-07-26 MED ORDER — POTASSIUM CHLORIDE 20 MEQ PO PACK
40.0000 meq | PACK | Freq: Once | ORAL | Status: AC
  Administered 2021-07-26: 40 meq via ORAL
  Filled 2021-07-26: qty 2

## 2021-07-26 NOTE — Progress Notes (Signed)
Xavier Giles is an 81 yo person living with CAD s/p CABG and multiple PCI, HTN, HLD who is admitted for multifocal strokes c/f embolic source and HAP. He is currently on eliquis 5 mg BID and TEE 5/26 showed PFO with no signs of left atrial thrombus.   Paged for elevated heart rate. He denies shortness of breath, chest pain, and palpitations. Vitals signs are HDS. Telemetry reviewed showing atrial fibrillation with rates between 100-120. EKG showed atrial fibrillation with RBBB that is also on prior tracings. Pulse is irregularly irregular, no murmurs appreciated.  A/P: asymptomatic afib with rates <120, no interventions indicated at this time. Patient is anticoagulated on eliquis. CHA2DS2-VASc Score = 6

## 2021-07-26 NOTE — TOC Progression Note (Addendum)
Transition of Care [facility]) - Progression Note    Patient Details  Name: Xavier Giles MRN: [id number] Date of Birth: 07/27/1940  [facility]) CM/SW Contact  Carles Collet, RN Phone Number: 07/26/2021, 2:26 PM  Clinical Narrative:   Ordered rollator to be delivered ot the room today.  [place] to follow for [facility] services    Expected Discharge Plan: [place] Barriers to Discharge: Continued Medical Work up  Expected Discharge Plan and Services Expected Discharge Plan: [place] Choice: [place] arrangements for the past 2 months: Single Family Home                           HH Arranged: OT, PT, Nurse's Aide, Speech Therapy HH Agency: [place] Date [facility] Agency Contacted: 07/25/21   Representative spoke with at [place]: Cindie   Social Determinants of Health (SDOH) Interventions    Readmission Risk Interventions     View : No data to display.

## 2021-07-26 NOTE — Progress Notes (Signed)
Physical Therapy Treatment Patient Details Name: Xavier Giles MRN: [id number] DOB: 12/19/1940 Today's Date: 07/26/2021   History of Present Illness Pt is a 81 y/o male presenting on 5/23 with AMS. Admitted for sepsis due to CAP. Found with Right Lower Lobe pulmonary embolism. 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    PT Comments    Pt supine in bed on arrival, he is very HOH.  Performed gt training but session limited due to elevated HR.  Pt with poor balance and staggering to the R without RW.  Trialed RW next session and his balance was improved.  Recommend use of RW at home until strength, balance and endurance improves.     Recommendations for follow up therapy are one component of a multi-disciplinary discharge planning process, led by the attending physician.  Recommendations may be updated based on patient status, additional functional criteria and insurance authorization.  Follow Up Recommendations  Home health PT     Assistance Recommended at Discharge Intermittent Supervision/Assistance  Patient can return home with the following Assistance with cooking/housework;Direct supervision/assist for medications management;Direct supervision/assist for financial management;Assist for transportation;Help with stairs or ramp for entrance;A little help with walking and/or transfers;A little help with bathing/dressing/bathroom   Equipment Recommendations  Rolling walker (2 wheels)    Recommendations for Other Services       Precautions / Restrictions Precautions Precautions: Fall Restrictions Weight Bearing Restrictions: No     Mobility  Bed Mobility Overal bed mobility: Needs Assistance Bed Mobility: Supine to Sit, Sit to Supine     Supine to  sit: Modified independent (Device/Increase time) Sit to supine: Modified independent (Device/Increase time)   General bed mobility comments: Increased time and effort with max VCs.    Transfers Overall transfer level: Needs assistance Equipment used: Rolling walker (2 wheels), None Transfers: Sit to/from Stand Sit to Stand: Supervision           General transfer comment: supervision with cues for safety.    Ambulation/Gait Ambulation/Gait assistance: Min assist Gait Distance (Feet): 60 Feet (x2) Assistive device: None, Rolling walker (2 wheels) (1st trial with out device.  2nd trial with RW) Gait Pattern/deviations: Step-through pattern, Staggering right, Trunk flexed, Decreased stride length Gait velocity: required decreased assistance with RW and get speed improved.     General Gait Details: Cues for posture and reciprocal armswing without AD. Pt with mild staggering to the R.  Pt required RW due to balance and DOE for energy conservation.,  HR elevated to 170s, returned to 130s with rest.  DOE noted SPO2 94% on RA.   Stairs             Wheelchair Mobility    Modified Rankin (Stroke Patients Only)       Balance                                            Cognition Arousal/Alertness: Awake/alert Behavior During Therapy: WFL for tasks assessed/performed Overall Cognitive Status: Difficult to assess                                 General  Comments: pt follows simple commands, requires cueing for safety.  Difficult to determine due to [facility].        Exercises      General Comments        Pertinent Vitals/Pain Pain Assessment Pain Assessment: 0-10 Pain Score: 0-No pain    Home Living                          Prior Function            PT Goals (current goals can now be found in the care plan section) Acute Rehab PT Goals Patient Stated Goal: daughter thinks it best to work toward getting home as  discharge destination. Potential to Achieve Goals: Good Progress towards PT goals: Progressing toward goals    Frequency    Min 3X/week      PT Plan Current plan remains appropriate    Co-evaluation              AM-PAC PT "6 Clicks" Mobility   Outcome Measure  Help needed turning from your back to your side while in a flat bed without using bedrails?: A Lot Help needed moving from lying on your back to sitting on the side of a flat bed without using bedrails?: A Lot Help needed moving to and from a bed to a chair (including a wheelchair)?: A Little Help needed standing up from a chair using your arms (e.g., wheelchair or bedside chair)?: A Little Help needed to walk in hospital room?: A Little Help needed climbing 3-5 steps with a railing? : A Little 6 Click Score: 16    End of Session Equipment Utilized During Treatment: Gait belt Activity Tolerance: Patient limited by fatigue;Treatment limited secondary to medical complications (Comment) (elevated HR with activity)   Nurse Communication: Mobility status PT Visit Diagnosis: Other abnormalities of gait and mobility (R26.89);Other symptoms and signs involving the nervous system (R29.898);Difficulty in walking, not elsewhere classified (R26.2)     Time: 1610-9604 PT Time Calculation (min) (ACUTE ONLY): 25 min  Charges:  $Gait Training: 8-22 mins $Therapeutic Activity: 8-22 mins                     Erasmo Leventhal , PTA Acute Rehabilitation Services Pager [phone] Office [phone]    Endiya Klahr Eli Hose 07/26/2021, 12:09 PM

## 2021-07-26 NOTE — Progress Notes (Signed)
Jessy Oto Villena to be D/C'd home with home health per MD order. Discussed with the patient's son, [name], and all questions fully answered.  Skin clean and dry without evidence of skin break down, no evidence of skin tears noted.  IV catheter discontinued intact. Site without signs and symptoms of complications. Dressing and pressure applied.  An After Visit Summary was printed and given to the patient.  Received call from patient's son that when he went to pick up eliquis it was near $600. Dr. Joneen Boers delivered an eliquis coupon to the unit. Patient's son states he will pick it up tomorrow morning. Evening dose provided before patient discharged. Patient escorted via [place], and D/C home via private auto.  Melonie [place]  07/26/2021 6:41 PM

## 2021-07-26 NOTE — Progress Notes (Signed)
Pharmacy Antibiotic Note  Xavier Giles is a 81 y.o. male admitted on 07/22/2021 with pneumonia.  Tm 102.2 F, WBC 10, CXR with increasing patchy airspace opacities in RUL concerning for progressive pneumonia. Vancomycin and Cefepime were initiated in the ED on 5/23, but have been discontinued. Pharmacy consulted to dose Zosyn (piperacillin-tazobactam).   Of note, patient was recent admitted (5/17-5/18) for CAP and received Levofloxacin 750 mg IV x 1, and was discharged on Augmentin and Doxycycline x 1 week.   Plan: Continue Zosyn 3.375g IV q8h extended infusion  Monitor daily WBC, temp, SCr, and clinical status Follow-up cultures, LOT, de-escalate as able   Height: '5\' 11"'$  (180.3 cm) Weight: 77.3 kg (170 lb 6.7 oz) IBW/kg (Calculated) : 75.3  Temp (24hrs), Avg:98.1 F (36.7 C), Min:97.9 F (36.6 C), Max:98.5 F (36.9 C)  Recent Labs  Lab 07/22/21 1656 07/23/21 0330 07/24/21 0138 07/25/21 0759 07/26/21 0353  WBC 10.0 9.9 11.8* 11.6* 13.5*  CREATININE 1.15 1.02 1.07 1.00 1.02  LATICACIDVEN 1.6  --   --   --   --      Estimated Creatinine Clearance: 59.5 mL/min (by C-G formula based on SCr of 1.02 mg/dL).    Allergies  Allergen Reactions   Keflex [Cephalexin] Other (See Comments)    Unknown ;   Patient states he is not aware of being allergic to cephalexin per IMTS resident MD report.     Zocor [Simvastatin] Other (See Comments)    Unknown    Lipitor [Atorvastatin] Other (See Comments)    Elevated blood sugar   Sulfa Antibiotics Nausea Only    Antimicrobials this admission: Vancomycin 5/23 x1 Cefepime 5/23 >> 5/24 Zosyn 5/24 >>   Microbiology results: 5/23 BCx: NGTD 5/23 UCx: NGTD 5/23 MRSA PCR: not detected   Thank you for allowing pharmacy to be a part of this patient's care.  Lestine Box, PharmD PGY2 Infectious Diseases Pharmacy Resident   Please check [email or web address] for unit-specific pharmacy phone numbers

## 2021-07-26 NOTE — Progress Notes (Signed)
Occupational Therapy Treatment Patient Details Name: Xavier Giles MRN: [id number] DOB: 06-Jan-1941 Today's Date: 07/26/2021   History of present illness Pt is a 81 y/o male presenting on 5/23 with AMS. Admitted for sepsis due to CAP. Found with Right Lower Lobe pulmonary embolism. 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   OT comments  This 81 yo male admitted for above seen today to look at functional vision with self feeding. Pt needed a lot of A for this task due to vision and decreased coordination of RUE. He will continue to benefit from acute OT with follow up [place].   Recommendations for follow up therapy are one component of a multi-disciplinary discharge planning process, led by the attending physician.  Recommendations may be updated based on patient status, additional functional criteria and insurance authorization.    Follow Up Recommendations  Home health OT    Assistance Recommended at Discharge Frequent or constant Supervision/Assistance  Patient can return home with the following  A little help with walking and/or transfers;A little help with bathing/dressing/bathroom;Assistance with cooking/housework;Direct supervision/assist for medications management;Direct supervision/assist for financial management;Assist for transportation;Help with stairs or ramp for entrance;Assistance with feeding   Equipment Recommendations  Tub/shower seat       Precautions / Restrictions Precautions Precautions: Fall Restrictions Weight Bearing Restrictions: No              ADL either performed or assessed with clinical judgement   ADL Overall ADL's : Needs assistance/impaired Eating/Feeding: Maximal assistance;Cueing for  compensatory techinques;Cueing for sequencing;With adaptive utensils;Bed level Eating/Feeding Details (indicate cue type and reason): Sat pt all the way up in bed to work on self feeding. He said he was having trouble seeing his food (the increased light did help a little). He also displayed difficulty getting food on spoon or fork, manipulating fork/spoon, getting food to mouth on spoon/fork (often undershot), getting straw in mouth (again undershooting). Spoke to dtr on phone and said he would need increased A for self feeding but try to encourage him to do it, start with putting food on spoon/fork for him and then let him take it to his mouth with working towards him getting food on spoon/fork himself. I also made her aware he does better with the red tubing to build up spoon/fork (and that I left 2 pieces on his tray table they can take with them when he goes hom).                                   General ADL Comments: Made dtr aware we were getting him a RW and a gait belt for them to use with him at home anytime he was up on his feet at home--she verbalized understanding.    Extremity/Trunk Assessment Upper Extremity Assessment Upper Extremity Assessment: RUE deficits/detail RUE Deficits / Details: difficulty manipulating objects with RUE (ie: spoon), does better with red tubing over spoon but still has trouble manipulating it to get food from plate and then to mouth.            Vision Baseline Vision/History: 1 Wears glasses;6 Macular Degeneration Ability to See in Adequate Light: 1 Impaired Patient Visual Report: Other (comment) (reports is worse) Additional Comments:  Pt having a hard time finding food on his plate and using fork and spoon to get food on them          Cognition Arousal/Alertness: Awake/alert Behavior During Therapy: WFL for tasks assessed/performed Overall Cognitive Status: Difficult to assess                                 General  Comments: pt follows simple commands, Difficult to determine due to [facility].                   Pertinent Vitals/ Pain       Pain Assessment Pain Assessment: No/denies pain         Frequency  Min 2X/week        Progress Toward Goals  OT Goals(current goals can now be found in the care plan section)  Progress towards OT goals: Progressing toward goals  Acute Rehab OT Goals OT Goal Formulation: With patient Time For Goal Achievement: 08/07/21 Potential to Achieve Goals: Good ADL Goals Pt Will Perform Eating: (P)  (pt will be able to self feed 25% of his meal post setup, good positioning in chair, and use of red tubing for utensils)  Plan Discharge plan remains appropriate       AM-PAC OT "6 Clicks" Daily Activity     Outcome Measure   Help from another person eating meals?: A Lot Help from another person taking care of personal grooming?: A Lot Help from another person toileting, which includes using toliet, bedpan, or urinal?: A Lot Help from another person bathing (including washing, rinsing, drying)?: A Lot Help from another person to put on and taking off regular upper body clothing?: A Lot Help from another person to put on and taking off regular lower body clothing?: A Lot 6 Click Score: 12    End of Session    OT Visit Diagnosis: Other abnormalities of gait and mobility (R26.89);Muscle weakness (generalized) (M62.81);Other symptoms and signs involving the nervous system (R29.898)   Activity Tolerance Patient tolerated treatment well   Patient Left in bed;with call bell/phone within reach;with bed alarm set   Nurse Communication  (use red tubing for utensils at meals, pt will still need increased A)        Time: 1458-1520 OT Time Calculation (min): 22 min  Charges: OT General Charges $OT Visit: 1 Visit OT Treatments $Self Care/Home Management : 8-22 mins  Xavier Giles, OTR/L Acute [employer] Pager [phone] Office  [phone]    Almon Register 07/26/2021, 6:27 PM

## 2021-07-26 NOTE — Discharge Instructions (Signed)
Your blood pressures have mostly been normal, please hold your amlodipine. In addition, please schedule close follow up with Dr. Allison Quarry office or with your PCP to discuss when to add your antihypertensives.   Please also STOP taking your brillinta. As you are now on a blood thinner for your blood clots in your lungs and legs.

## 2021-07-27 LAB — CULTURE, BLOOD (ROUTINE X 2)
Culture: NO GROWTH
Special Requests: ADEQUATE

## 2021-07-29 ENCOUNTER — Ambulatory Visit: Payer: Medicare Other | Admitting: Internal Medicine

## 2021-07-29 ENCOUNTER — Emergency Department (HOSPITAL_COMMUNITY): Payer: Medicare Other

## 2021-07-29 ENCOUNTER — Telehealth: Payer: Self-pay

## 2021-07-29 ENCOUNTER — Inpatient Hospital Stay (HOSPITAL_COMMUNITY): Payer: Medicare Other

## 2021-07-29 ENCOUNTER — Encounter (HOSPITAL_COMMUNITY): Payer: Self-pay | Admitting: Cardiovascular Disease

## 2021-07-29 ENCOUNTER — Inpatient Hospital Stay (HOSPITAL_COMMUNITY)
Admission: EM | Admit: 2021-07-29 | Discharge: 2021-07-31 | DRG: 065 | Disposition: A | Discharge status: Hospice | Payer: Medicare Other | Attending: Internal Medicine | Admitting: Internal Medicine

## 2021-07-29 DIAGNOSIS — F32A Depression, unspecified: Secondary | ICD-10-CM | POA: Diagnosis present

## 2021-07-29 DIAGNOSIS — Z66 Do not resuscitate: Secondary | ICD-10-CM | POA: Diagnosis present

## 2021-07-29 DIAGNOSIS — I10 Essential (primary) hypertension: Secondary | ICD-10-CM | POA: Diagnosis present

## 2021-07-29 DIAGNOSIS — Z8249 Family history of ischemic heart disease and other diseases of the circulatory system: Secondary | ICD-10-CM

## 2021-07-29 DIAGNOSIS — I639 Cerebral infarction, unspecified: Principal | ICD-10-CM | POA: Diagnosis present

## 2021-07-29 DIAGNOSIS — Z882 Allergy status to sulfonamides status: Secondary | ICD-10-CM

## 2021-07-29 DIAGNOSIS — Z7189 Other specified counseling: Secondary | ICD-10-CM | POA: Diagnosis not present

## 2021-07-29 DIAGNOSIS — Z79899 Other long term (current) drug therapy: Secondary | ICD-10-CM

## 2021-07-29 DIAGNOSIS — M545 Low back pain, unspecified: Secondary | ICD-10-CM | POA: Diagnosis present

## 2021-07-29 DIAGNOSIS — J45909 Unspecified asthma, uncomplicated: Secondary | ICD-10-CM | POA: Diagnosis present

## 2021-07-29 DIAGNOSIS — H353220 Exudative age-related macular degeneration, left eye, stage unspecified: Secondary | ICD-10-CM | POA: Diagnosis present

## 2021-07-29 DIAGNOSIS — R45851 Suicidal ideations: Secondary | ICD-10-CM | POA: Diagnosis present

## 2021-07-29 DIAGNOSIS — Z888 Allergy status to other drugs, medicaments and biological substances status: Secondary | ICD-10-CM

## 2021-07-29 DIAGNOSIS — I63432 Cerebral infarction due to embolism of left posterior cerebral artery: Secondary | ICD-10-CM | POA: Diagnosis present

## 2021-07-29 DIAGNOSIS — I25810 Atherosclerosis of coronary artery bypass graft(s) without angina pectoris: Secondary | ICD-10-CM | POA: Diagnosis present

## 2021-07-29 DIAGNOSIS — Z20822 Contact with and (suspected) exposure to covid-19: Secondary | ICD-10-CM | POA: Diagnosis present

## 2021-07-29 DIAGNOSIS — E1165 Type 2 diabetes mellitus with hyperglycemia: Secondary | ICD-10-CM | POA: Diagnosis present

## 2021-07-29 DIAGNOSIS — Z9841 Cataract extraction status, right eye: Secondary | ICD-10-CM

## 2021-07-29 DIAGNOSIS — Z961 Presence of intraocular lens: Secondary | ICD-10-CM | POA: Diagnosis present

## 2021-07-29 DIAGNOSIS — Z515 Encounter for palliative care: Secondary | ICD-10-CM | POA: Diagnosis not present

## 2021-07-29 DIAGNOSIS — Z86711 Personal history of pulmonary embolism: Secondary | ICD-10-CM

## 2021-07-29 DIAGNOSIS — Z7901 Long term (current) use of anticoagulants: Secondary | ICD-10-CM | POA: Diagnosis not present

## 2021-07-29 DIAGNOSIS — Z87891 Personal history of nicotine dependence: Secondary | ICD-10-CM

## 2021-07-29 DIAGNOSIS — Z881 Allergy status to other antibiotic agents status: Secondary | ICD-10-CM

## 2021-07-29 DIAGNOSIS — E785 Hyperlipidemia, unspecified: Secondary | ICD-10-CM | POA: Diagnosis present

## 2021-07-29 DIAGNOSIS — H353110 Nonexudative age-related macular degeneration, right eye, stage unspecified: Secondary | ICD-10-CM | POA: Diagnosis present

## 2021-07-29 DIAGNOSIS — Z9842 Cataract extraction status, left eye: Secondary | ICD-10-CM

## 2021-07-29 DIAGNOSIS — G8929 Other chronic pain: Secondary | ICD-10-CM | POA: Diagnosis present

## 2021-07-29 DIAGNOSIS — I2510 Atherosclerotic heart disease of native coronary artery without angina pectoris: Secondary | ICD-10-CM | POA: Diagnosis present

## 2021-07-29 DIAGNOSIS — H5461 Unqualified visual loss, right eye, normal vision left eye: Secondary | ICD-10-CM | POA: Diagnosis present

## 2021-07-29 DIAGNOSIS — R471 Dysarthria and anarthria: Secondary | ICD-10-CM | POA: Diagnosis present

## 2021-07-29 DIAGNOSIS — R29712 NIHSS score 12: Secondary | ICD-10-CM | POA: Diagnosis present

## 2021-07-29 DIAGNOSIS — Z86718 Personal history of other venous thrombosis and embolism: Secondary | ICD-10-CM

## 2021-07-29 DIAGNOSIS — I252 Old myocardial infarction: Secondary | ICD-10-CM

## 2021-07-29 LAB — URINALYSIS, ROUTINE W REFLEX MICROSCOPIC
Bacteria, UA: NONE SEEN
Bilirubin Urine: NEGATIVE
Glucose, UA: NEGATIVE mg/dL
Hgb urine dipstick: NEGATIVE
Ketones, ur: 80 mg/dL — AB
Leukocytes,Ua: NEGATIVE
Nitrite: NEGATIVE
Protein, ur: 30 mg/dL — AB
Specific Gravity, Urine: 1.023 (ref 1.005–1.030)
pH: 6 (ref 5.0–8.0)

## 2021-07-29 LAB — COMPREHENSIVE METABOLIC PANEL
ALT: 81 U/L — ABNORMAL HIGH (ref 0–44)
AST: 44 U/L — ABNORMAL HIGH (ref 15–41)
Albumin: 2.3 g/dL — ABNORMAL LOW (ref 3.5–5.0)
Alkaline Phosphatase: 99 U/L (ref 38–126)
Anion gap: 10 (ref 5–15)
BUN: 17 mg/dL (ref 8–23)
CO2: 24 mmol/L (ref 22–32)
Calcium: 8.3 mg/dL — ABNORMAL LOW (ref 8.9–10.3)
Chloride: 103 mmol/L (ref 98–111)
Creatinine, Ser: 1.1 mg/dL (ref 0.61–1.24)
GFR, Estimated: >60 mL/min (ref >60)
Glucose, Bld: 113 mg/dL — ABNORMAL HIGH (ref 70–99)
Potassium: 3.7 mmol/L (ref 3.5–5.1)
Sodium: 137 mmol/L (ref 135–145)
Total Bilirubin: 1.1 mg/dL (ref 0.3–1.2)
Total Protein: 5.4 g/dL — ABNORMAL LOW (ref 6.5–8.1)

## 2021-07-29 LAB — CBC WITH DIFFERENTIAL/PLATELET
Abs Immature Granulocytes: 0.09 10*3/uL — ABNORMAL HIGH (ref 0.00–0.07)
Basophils Absolute: 0 10*3/uL (ref 0.0–0.1)
Basophils Relative: 0 %
Eosinophils Absolute: 0.2 10*3/uL (ref 0.0–0.5)
Eosinophils Relative: 2 %
HCT: 43.7 % (ref 39.0–52.0)
Hemoglobin: 14.2 g/dL (ref 13.0–17.0)
Immature Granulocytes: 1 %
Lymphocytes Relative: 11 %
Lymphs Abs: 1.2 10*3/uL (ref 0.7–4.0)
MCH: 27.8 pg (ref 26.0–34.0)
MCHC: 32.5 g/dL (ref 30.0–36.0)
MCV: 85.5 fL (ref 80.0–100.0)
Monocytes Absolute: 1.3 10*3/uL — ABNORMAL HIGH (ref 0.1–1.0)
Monocytes Relative: 11 %
Neutro Abs: 8.5 10*3/uL — ABNORMAL HIGH (ref 1.7–7.7)
Neutrophils Relative %: 75 %
Platelets: 323 10*3/uL (ref 150–400)
RBC: 5.11 MIL/uL (ref 4.22–5.81)
RDW: 13.5 % (ref 11.5–15.5)
WBC: 11.3 10*3/uL — ABNORMAL HIGH (ref 4.0–10.5)
nRBC: 0 % (ref 0.0–0.2)

## 2021-07-29 LAB — RAPID URINE DRUG SCREEN, HOSP PERFORMED
Amphetamines: NOT DETECTED
Barbiturates: NOT DETECTED
Benzodiazepines: NOT DETECTED
Cocaine: NOT DETECTED
Opiates: NOT DETECTED
Tetrahydrocannabinol: NOT DETECTED

## 2021-07-29 LAB — ETHANOL: Alcohol, Ethyl (B): <10 mg/dL (ref <10)

## 2021-07-29 LAB — SARS CORONAVIRUS 2 BY RT PCR: SARS Coronavirus 2 by RT PCR: NEGATIVE

## 2021-07-29 IMAGING — MR MR HEAD W/O CM
12 of 13 series · 42 of 48 positions shown · non-contrast
Comparison: Recent MRI from [DATE].
COMPARISON: Recent MRI from [DATE].

Addendum:
CLINICAL DATA: Initial evaluation for mental status change, unknown
cause.

EXAM:
MRI HEAD WITHOUT CONTRAST
TECHNIQUE: Multiplanar, multiecho pulse sequences of the brain and surrounding
structures were obtained without intravenous contrast.

[Series 5: DWI · axial · 3.0mm · 0.88mm/px · z∈[-63,+89]mm · 7 of 103 slices shown (1 of 4)]
[im 1/103]
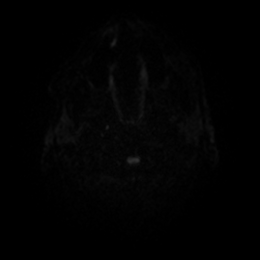
[im 18/103]
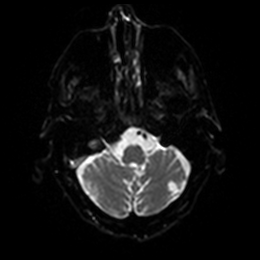
[im 35/103]
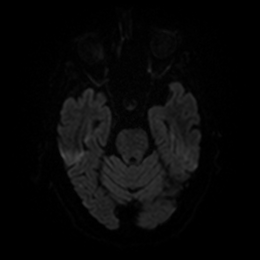
[im 52/103]
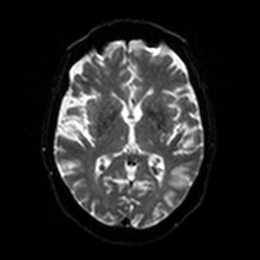
[im 69/103]
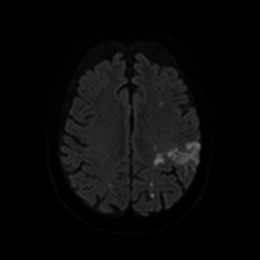
[im 86/103]
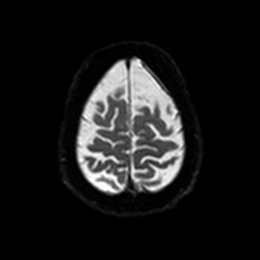
[im 103/103]
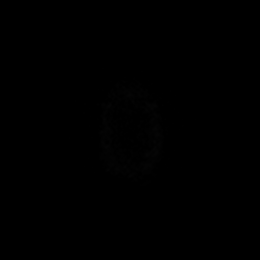

[Series 6: DWI · axial · 3.0mm · 0.88mm/px · z∈[-63,+89]mm · 4 of 52 slices shown (2 of 4)]
[im 1/52]
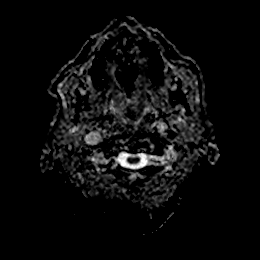
[im 18/52]
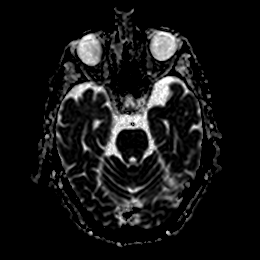
[im 35/52]
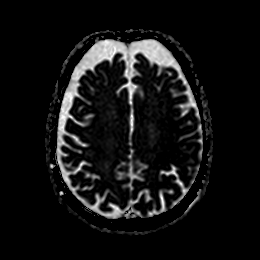
[im 52/52]
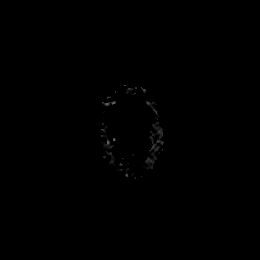

[Series 7: DWI · coronal · 4.0mm · 0.88mm/px · 6 of 76 slices shown (3 of 4)]
[im 1/76]
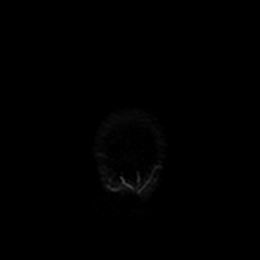
[im 16/76]
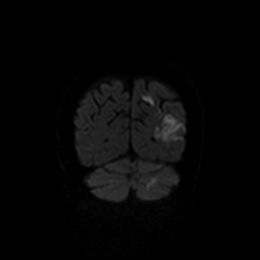
[im 31/76]
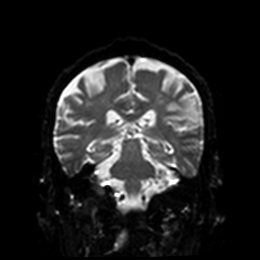
[im 46/76]
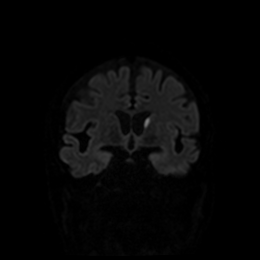
[im 61/76]
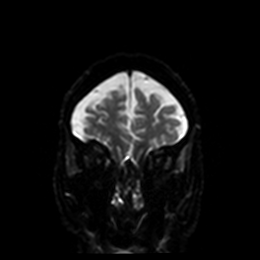
[im 76/76]
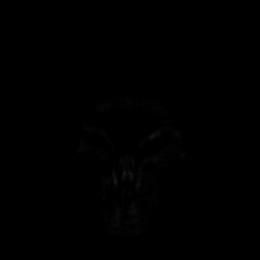

[Series 8: DWI · coronal · 4.0mm · 0.88mm/px · 3 of 38 slices shown (4 of 4)]
[im 1/38]
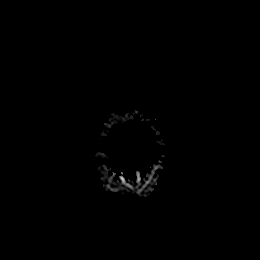
[im 19/38]
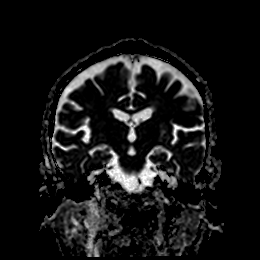
[im 38/38]
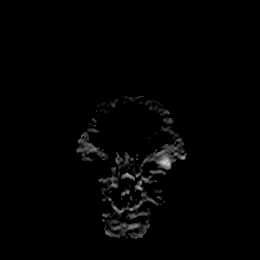

[Series 9: T1 · sagittal · 5.0mm · 0.75mm/px · 2 of 25 slices shown]
[im 1/25]
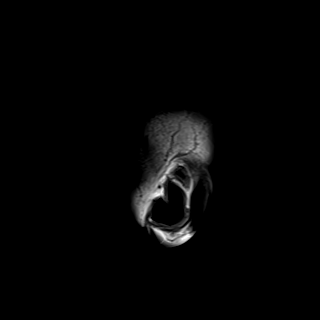
[im 25/25]
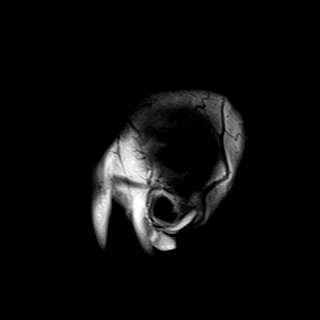

[Series 10: T2 · axial · 5.0mm · 0.72mm/px · z∈[-66,+90]mm · 2 of 27 slices shown (1 of 2)]
[im 1/27]
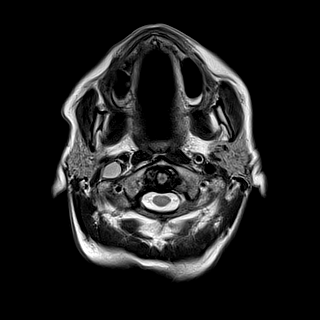
[im 27/27]
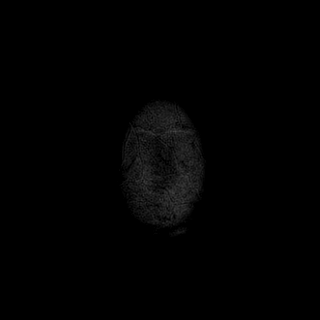

[Series 11: FLAIR · axial · 5.0mm · 0.45mm/px · z∈[-66,+89]mm · 2 of 27 slices shown]
[im 1/27]
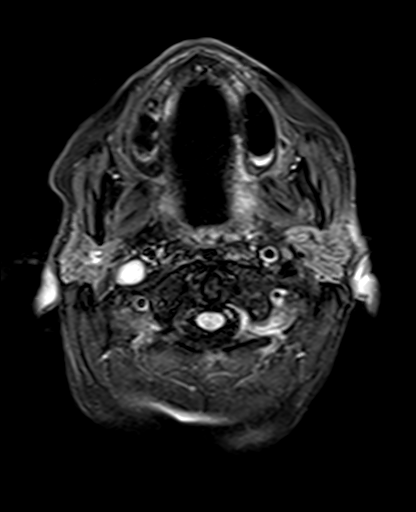
[im 27/27]
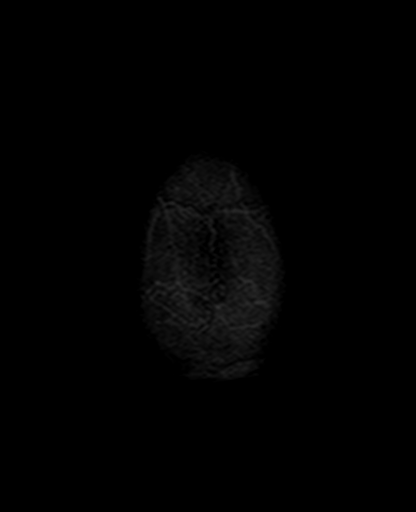

[Series 12: mag_images · axial · 3.0mm · 0.90mm/px · z∈[-70,+95]mm · 4 of 56 slices shown]
[im 1/56]
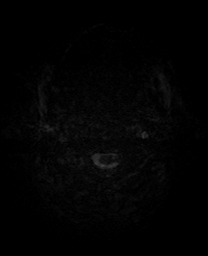
[im 19/56]
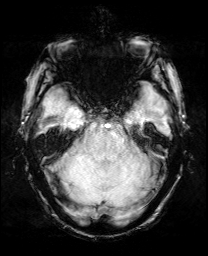
[im 37/56]
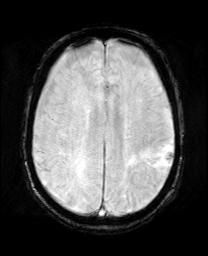
[im 56/56]
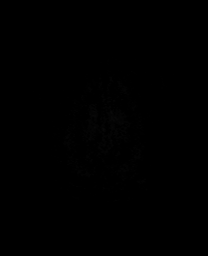

[Series 13: pha_images · axial · 3.0mm · 0.90mm/px · z∈[-70,+95]mm · 4 of 56 slices shown]
[im 1/56]
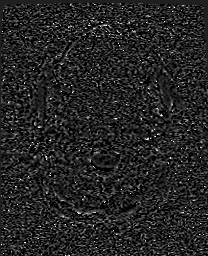
[im 19/56]
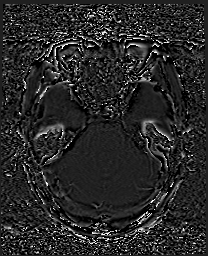
[im 37/56]
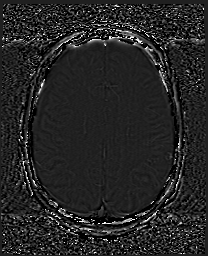
[im 56/56]
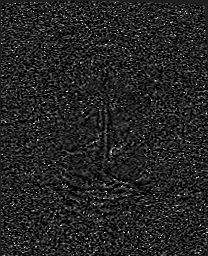

[Series 14: swi_images · axial · 3.0mm · 0.90mm/px · z∈[-70,+95]mm · 4 of 56 slices shown]
[im 1/56]
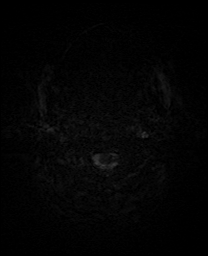
[im 19/56]
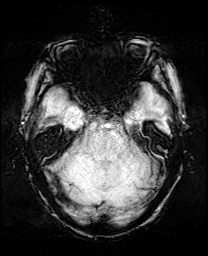
[im 37/56]
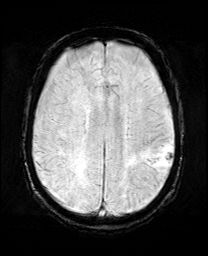
[im 56/56]
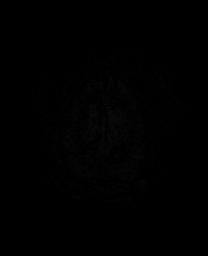

[Series 15: mip_images(sw) · axial · 24.0mm · 0.90mm/px · z∈[-59,-11]mm · 2 of 49 slices shown]
[im 1/49]
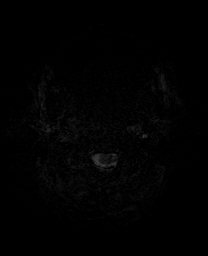
[im 17/49]
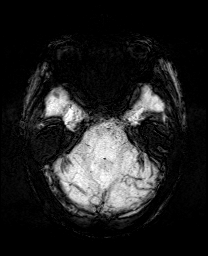

[Series 17: T2 · coronal · 5.0mm · 0.34mm/px · 2 of 31 slices shown (2 of 2)]
[im 1/31]
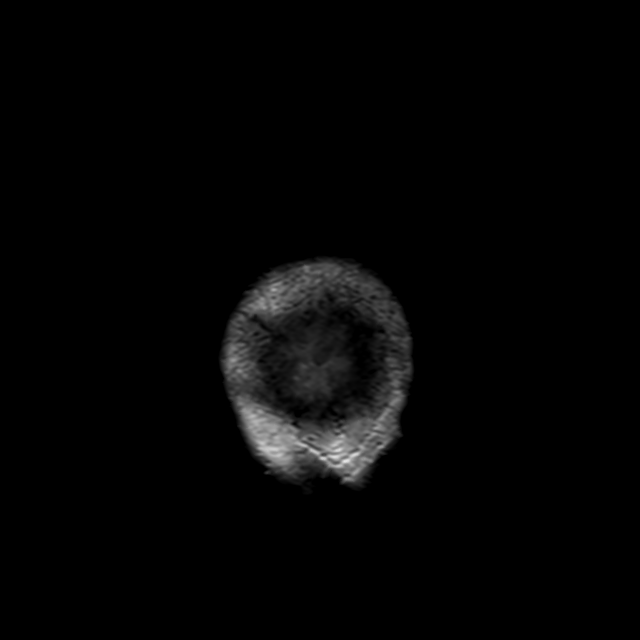
[im 31/31]
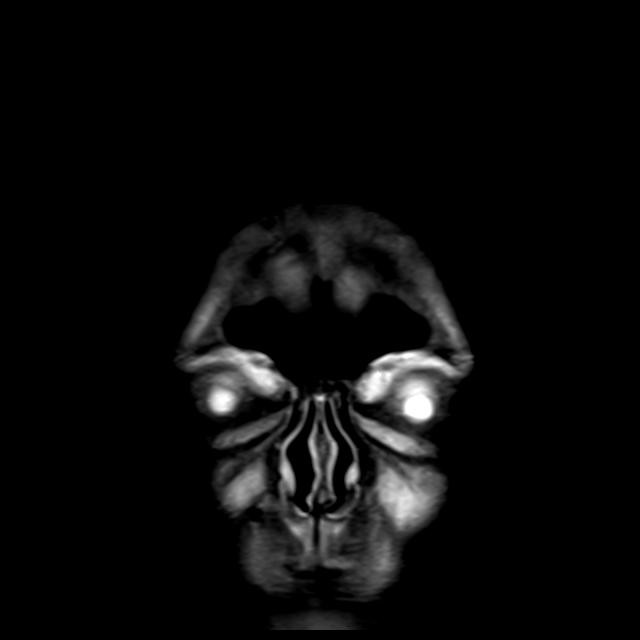

[42 of 48 positions shown; findings below may reference images not displayed]

FINDINGS: Brain: There has been continued interval evolution of previously
identified multifocal ischemic infarcts involving the left greater
than right cerebral and cerebellar hemispheres. Since previous exam,
there has been interval expansion of acute ischemic changes
involving the posterior left cerebral hemisphere, with multiple new
areas of ischemia involving the left parieto-occipital region
(series 5, images 89, 78). The remainder of the evolving ischemic
changes are otherwise relatively similar. Evidence for associated
petechial hemorrhage at the posterior left frontoparietal region
(series 14, image 37) Heidelberg classification 1a: HI1, scattered
small petechiae, no mass effect. No frank hemorrhagic transformation
by MRI.

Underlying atrophy with chronic microvascular ischemic disease again
noted. Few scattered remote cerebellar infarcts noted as well. No
mass lesion, significant mass effect, or midline shift. No
hydrocephalus or extra-axial fluid collection. Pituitary gland
suprasellar region within normal limits. Midline structures intact
and normally formed.

Vascular: Circumferential T2/FLAIR signal abnormality now seen about
the distal cervical left ICA, new from prior (series 10, image 1),
and could reflect changes of a proximal stenosis or possibly
dissection. Major intracranial vascular flow voids are otherwise
maintained.

Skull and upper cervical spine: Craniocervical junction within
normal limits. Bone marrow signal intensity normal. No scalp soft
tissue abnormality.

Sinuses/Orbits: Prior bilateral ocular lens replacement. Paranasal
sinuses are clear. Small right greater than left mastoid effusions
noted.

Other: None.
IMPRESSION: 1. Interval expansion of acute ischemic changes involving the
posterior left cerebral hemisphere, with multiple new areas of
ischemia involving the left parieto-occipital region. Associated
petechial hemorrhage without frank hemorrhagic transformation.
2. Otherwise normal expected interval evolution of previously
identified multifocal ischemic infarcts involving the left greater
than right cerebral and cerebellar hemispheres.
3. Circumferential T2/FLAIR signal abnormality about the distal
cervical left ICA, new from prior. Finding could reflect changes of
a proximal stenosis or possibly dissection. Further evaluation with
dedicated CTA and/or MRA suggested for further evaluation.
4. Underlying atrophy with chronic ischemic changes as above.

ADDENDUM:
In addition to the initially described findings, a few additional
new subcentimeter foci of acute ischemia noted within the
contralateral right cerebral hemisphere as well, involving the right
caudate and right parieto-occipital region.

*** End of Addendum ***
FINDINGS: Brain: There has been continued interval evolution of previously
identified multifocal ischemic infarcts involving the left greater
than right cerebral and cerebellar hemispheres. Since previous exam,
there has been interval expansion of acute ischemic changes
involving the posterior left cerebral hemisphere, with multiple new
areas of ischemia involving the left parieto-occipital region
(series 5, images 89, 78). The remainder of the evolving ischemic
changes are otherwise relatively similar. Evidence for associated
petechial hemorrhage at the posterior left frontoparietal region
(series 14, image 37) Heidelberg classification 1a: HI1, scattered
small petechiae, no mass effect. No frank hemorrhagic transformation
by MRI.

Underlying atrophy with chronic microvascular ischemic disease again
noted. Few scattered remote cerebellar infarcts noted as well. No
mass lesion, significant mass effect, or midline shift. No
hydrocephalus or extra-axial fluid collection. Pituitary gland
suprasellar region within normal limits. Midline structures intact
and normally formed.

Vascular: Circumferential T2/FLAIR signal abnormality now seen about
the distal cervical left ICA, new from prior (series 10, image 1),
and could reflect changes of a proximal stenosis or possibly
dissection. Major intracranial vascular flow voids are otherwise
maintained.

Skull and upper cervical spine: Craniocervical junction within
normal limits. Bone marrow signal intensity normal. No scalp soft
tissue abnormality.

Sinuses/Orbits: Prior bilateral ocular lens replacement. Paranasal
sinuses are clear. Small right greater than left mastoid effusions
noted.

Other: None.
IMPRESSION: 1. Interval expansion of acute ischemic changes involving the
posterior left cerebral hemisphere, with multiple new areas of
ischemia involving the left parieto-occipital region. Associated
petechial hemorrhage without frank hemorrhagic transformation.
2. Otherwise normal expected interval evolution of previously
identified multifocal ischemic infarcts involving the left greater
than right cerebral and cerebellar hemispheres.
3. Circumferential T2/FLAIR signal abnormality about the distal
cervical left ICA, new from prior. Finding could reflect changes of
a proximal stenosis or possibly dissection. Further evaluation with
dedicated CTA and/or MRA suggested for further evaluation.
4. Underlying atrophy with chronic ischemic changes as above.

## 2021-07-29 IMAGING — CT CT ANGIO HEAD-NECK (W OR W/O PERF)
1 of 11 series · 5 of 33 positions shown · IV contrast (APPLIED)
Comparison: Prior study from earlier the same day as well as
previous exam from [DATE].

CLINICAL DATA: Follow-up examination for acute stroke.

EXAM:
CT ANGIOGRAPHY HEAD AND NECK
TECHNIQUE: Multidetector CT imaging of the head and neck was performed using
the standard protocol during bolus administration of intravenous
contrast. Multiplanar CT image reconstructions and MIPs were
obtained to evaluate the vascular anatomy. Carotid stenosis
measurements (when applicable) are obtained utilizing NASCET
criteria, using the distal internal carotid diameter as the
denominator.

[Series 9: ax thins · axial · 0.39mm/px · z∈[+970,+1215]mm · 5 of 370 slices shown]
[im 62/370  soft-tissue]
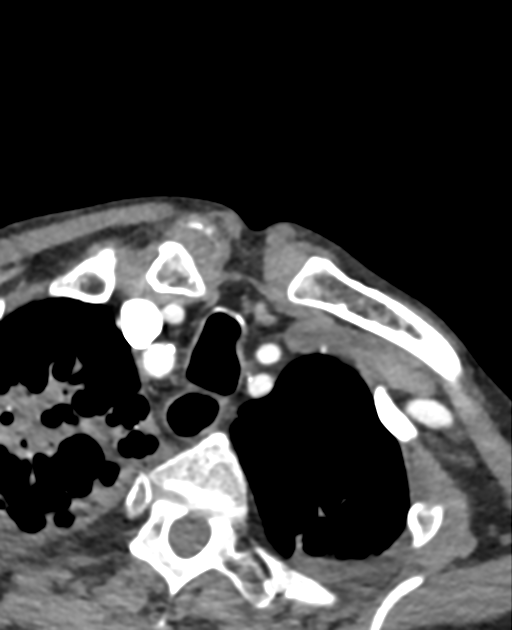
[im 124/370  bone]
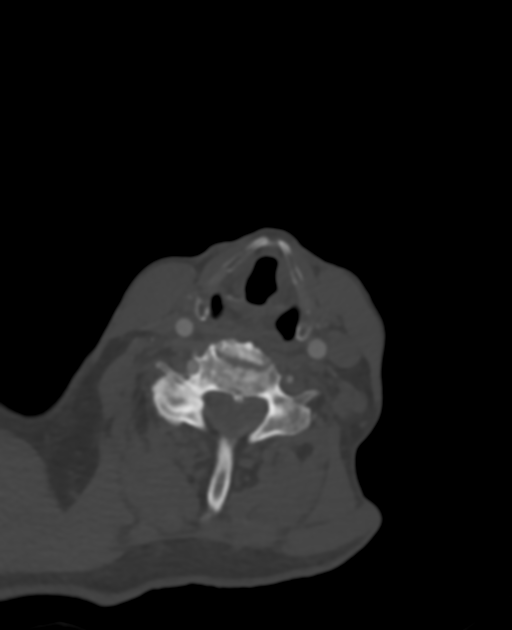
[im 185/370  soft-tissue]
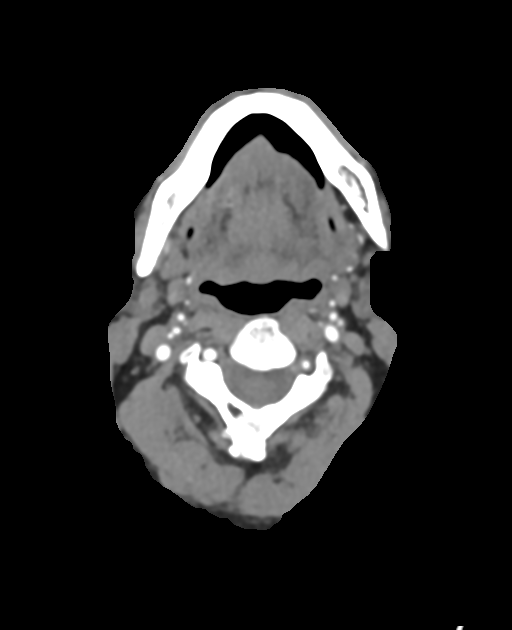
[im 247/370  bone]
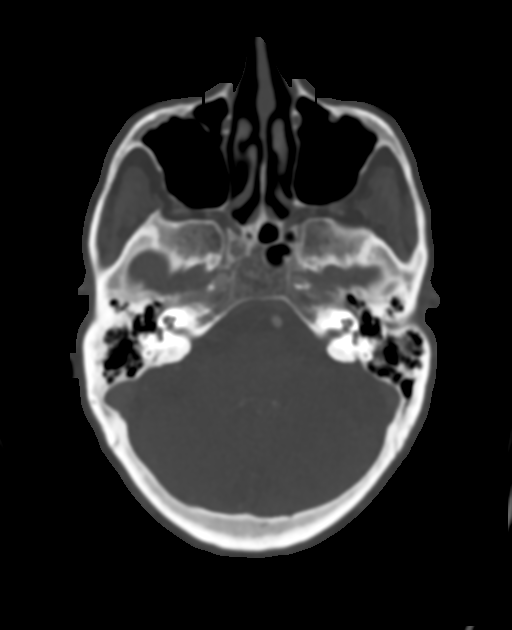
[im 308/370  soft-tissue]
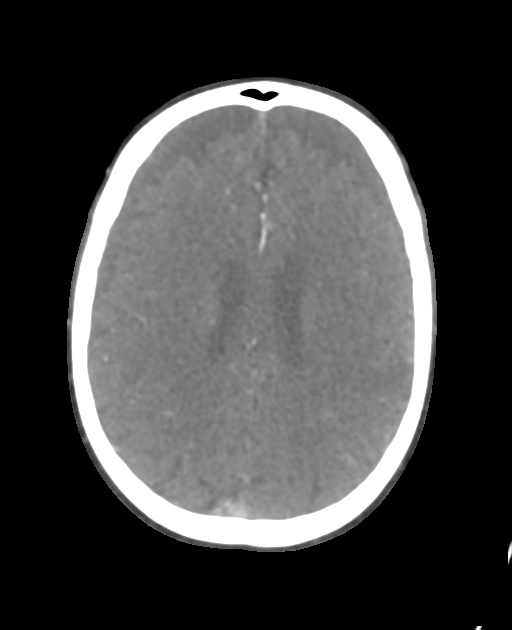

[5 of 33 positions shown; findings below may reference images not displayed]

RADIATION DOSE REDUCTION: This exam was performed according to the
departmental dose-optimization program which includes automated
exposure control, adjustment of the mA and/or kV according to
patient size and/or use of iterative reconstruction technique.

CONTRAST:  80mL OMNIPAQUE IOHEXOL 350 MG/ML SOLN
FINDINGS: CT HEAD FINDINGS

Brain: Atrophy with chronic microvascular ischemic disease and
multiple remote cerebellar infarcts. Evolving acute to early
subacute ischemic changes involving the left greater than right
cerebral and cerebellar hemispheres, better characterized on prior
brain MRI. No associated hemorrhage evident by CT. No significant
regional mass effect.

No mass lesion or midline shift. No hydrocephalus or extra-axial
fluid collection.

Vascular: No hyperdense vessel. Calcified atherosclerosis present at
skull base.

Skull: No new finding.

Sinuses: Paranasal sinuses are clear. Small right mastoid effusion
noted.

Orbits: Globes orbital soft tissues demonstrate no acute finding.

Review of the MIP images confirms the above findings

CTA NECK FINDINGS

Aortic arch: Visualized aortic arch normal caliber with normal 3
vessel morphology. Moderate atheromatous change about the arch and
origin of the great vessels. 50% stenoses involving the origins of
the brachiocephalic and left common carotid arteries, with 40%
stenosis involving the proximal left subclavian artery, stable.

Right carotid system: Right CCA remains patent to the bifurcation.
Estimated 50% stenosis involving the proximal cervical right ICA
noted, unchanged. Right ICA remains patent distally without new or
progressive finding.

Left carotid system: 50% stenosis at the origin of the left CCA.
Eccentric calcified plaque about the left carotid bulb/proximal left
ICA without hemodynamically significant stenosis. No evidence for
dissection or occlusion. Flow within the left ICA is somewhat
attenuated as compared to the contralateral right ICA on today's
exam.

Vertebral arteries: Both vertebral arteries arise from the
subclavian arteries. 40% proximal left subclavian artery stenosis.
50% stenosis at the origin of the right brachiocephalic artery.
Atheromatous change involving the proximal left vertebral artery
with associated moderate to severe left V1 stenoses, with more mild
to moderate distal left V2 and V3 stenoses. Mild atheromatous
narrowing at the origin of the right vertebral artery. Overall,
appearance is stable from prior. No evidence for dissection or
interval occlusion.

Skeleton: No discrete or worrisome osseous lesions. Median
sternotomy wires noted. Patient is edentulous.

Other neck: No other acute soft tissue abnormality within the neck.
Few scattered thyroid nodules measuring up to 1 cm noted, doubtful
significance given size and patient age, no follow-up imaging
recommended (ref: [HOSPITAL]. [DATE]): 143-50).

Upper chest: Emphysema. Multifocal consolidative opacities involving
the visualized right greater than left upper lobes, consistent with
pneumonia. Tiny filling defect within a right-sided subsegmental
pulmonary artery, consistent with previously identified pulmonary
emboli.

Review of the MIP images confirms the above findings

CTA HEAD FINDINGS

Anterior circulation: Petrous segments remain patent. Extensive
atheromatous change throughout the carotid siphons with associated
moderate to severe multifocal narrowing, right worse than left,
stable. A1 segments patent. Normal anterior communicating artery
complex. Anterior cerebral arteries remain patent to their distal
aspects without significant stenosis. No M1 stenosis or occlusion.
No proximal MCA branch occlusion. Distal MCA branches perfused and
symmetric. Distal small vessel atheromatous irregularity noted.

Posterior circulation: Both vertebral arteries patent as they course
into the cranial vault. Atheromatous change about the proximal right
V4 segment with associated moderate stenosis. Atheromatous change
about the left V4 segment with associated moderate to severe
multifocal stenoses, stable. Both PICA remain patent. Basilar
remains patent to its distal aspect without stenosis superior
cerebellar arteries remain patent bilaterally. Both PCAs primarily
supplied via the basilar. Short-segment severe right P2 stenosis,
stable. PCAs otherwise patent to their distal aspects without
significant stenosis.

Venous sinuses: Grossly patent allowing for timing the contrast
bolus.

Anatomic variants: None significant.  No aneurysm.

Review of the MIP images confirms the above findings
IMPRESSION: CT HEAD IMPRESSION:

1. Patchy multifocal evolving acute to early subacute ischemic
infarcts involving the left greater than right cerebral and
cerebellar hemispheres, better characterized on recent brain MRI. No
associated hemorrhage by CT. No significant regional mass effect.
2. Underlying atrophy with chronic microvascular ischemic disease,
with multiple remote cerebellar infarcts.

CTA HEAD AND NECK IMPRESSION:

1. Stable CTA of the head and neck as compared to [DATE]. No
large vessel occlusion or other interval emergent finding.
2. Atheromatous disease about the major arterial vasculature of the
neck with associated 50% stenoses at the origins of the
brachiocephalic and left common carotid arteries, 40% proximal left
subclavian artery stenosis, and 50% stenosis involving the proximal
cervical right ICA. Moderate to severe multifocal left vertebral
artery stenoses, most pronounced at the V1 segment.
3. Intracranial atherosclerotic disease with associated moderate to
severe stenoses about the right greater than left carotid siphons,
left greater than right V4 segments, and right P2 segment.
4. Multifocal consolidative opacities about the right greater than
left upper lobes, consistent with pneumonia.
5. Tiny filling defect within a visualized right-sided subsegmental
pulmonary artery, consistent with previously identified pulmonary
emboli.
6.  Emphysema ([B5]-[B5]).

## 2021-07-29 IMAGING — CT CT CHEST-ABD-PELV W/ CM
3 of 5 series · 14 of 36 positions shown, 16 images · IV contrast (APPLIED)
Comparison: [DATE]

CLINICAL DATA: Metastatic disease evaluation.

EXAM:
CT CHEST, ABDOMEN, AND PELVIS WITH CONTRAST
TECHNIQUE: Multidetector CT imaging of the chest, abdomen and pelvis was
performed following the standard protocol during bolus
administration of intravenous contrast.

[Series 5: cap with · axial · 0.71mm/px · z∈[+420,+975]mm · 9 of 139 slices shown, 11 images]
[im 14/139  mediastinal]
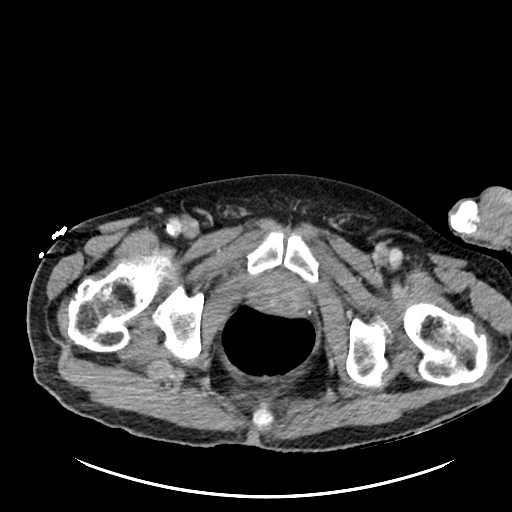
[im 14/139  bone]
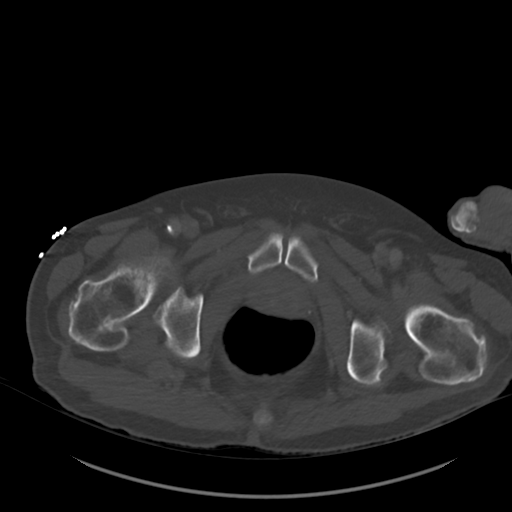
[im 28/139  mediastinal]
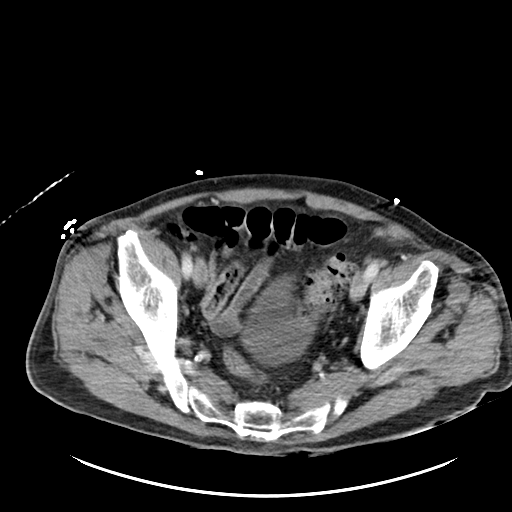
[im 42/139  mediastinal]
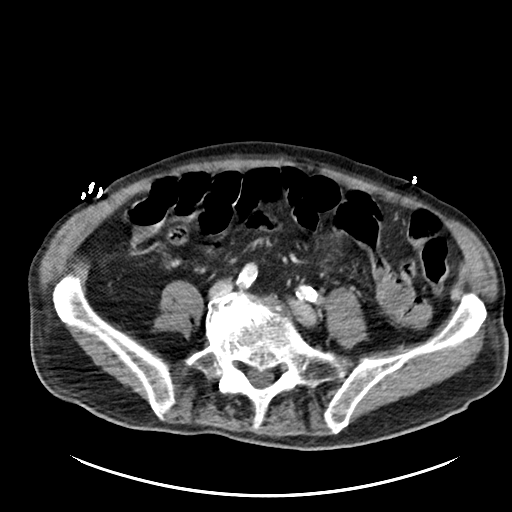
[im 56/139  mediastinal]
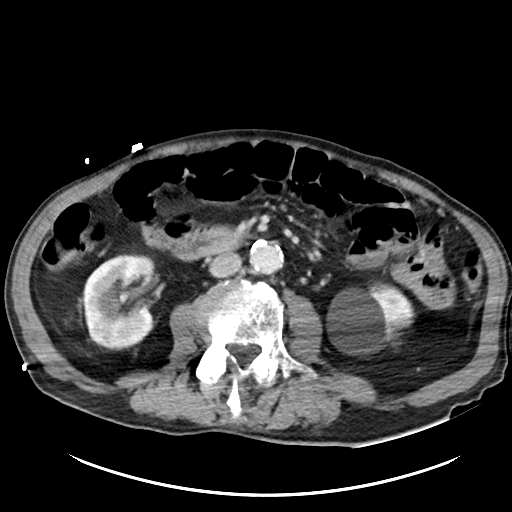
[im 70/139  mediastinal]
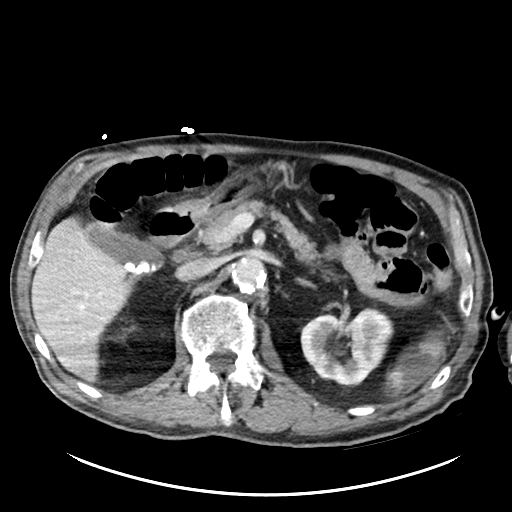
[im 83/139  mediastinal]
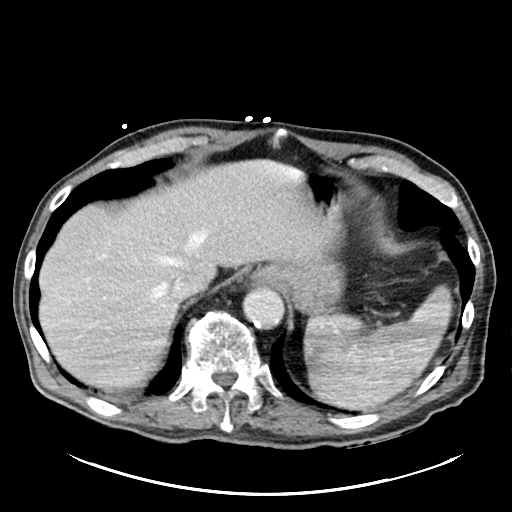
[im 97/139  mediastinal]
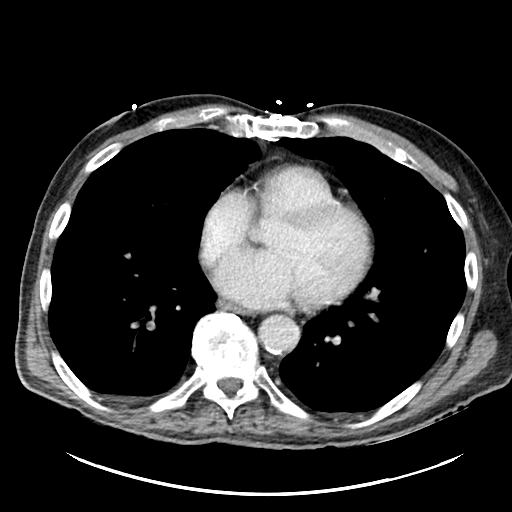
[im 111/139  mediastinal]
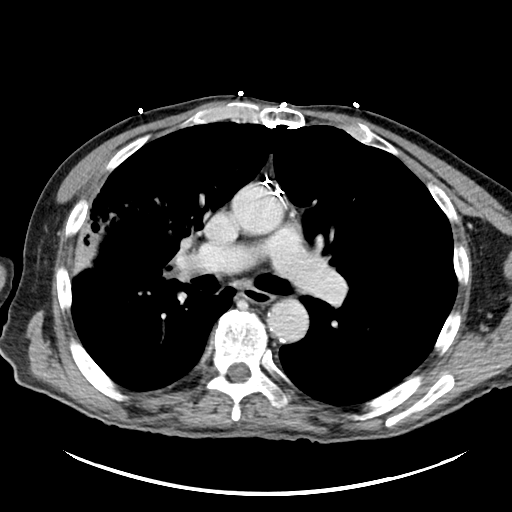
[im 125/139  mediastinal]
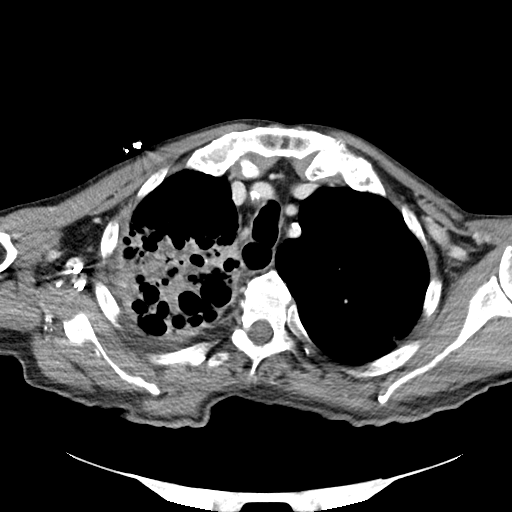
[im 125/139  bone]
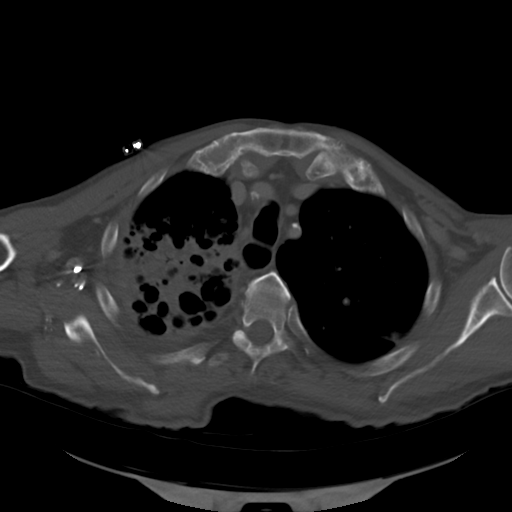

[Series 7: lungs · axial · 0.67mm/px · z∈[+741,+791]mm · 2 of 165 slices shown]
[im 13/165  mediastinal]
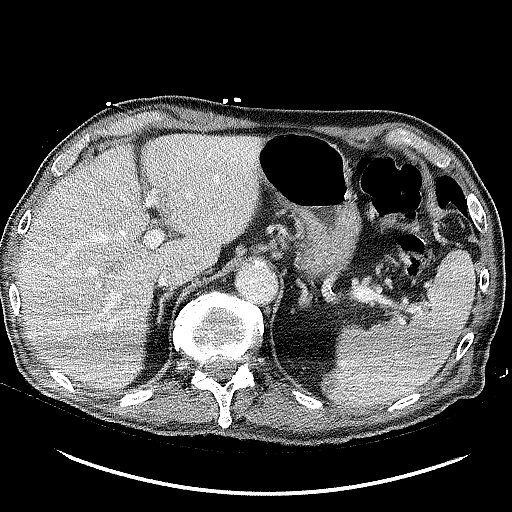
[im 38/165  mediastinal]
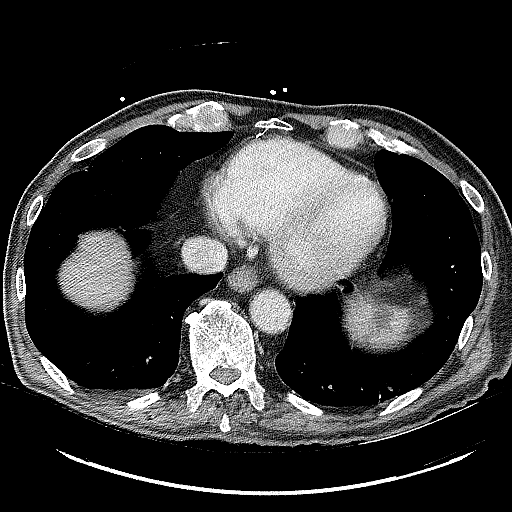

[Series 9: cor · coronal · 0.77mm/px · 3 of 85 slices shown]
[im 17/85  mediastinal]
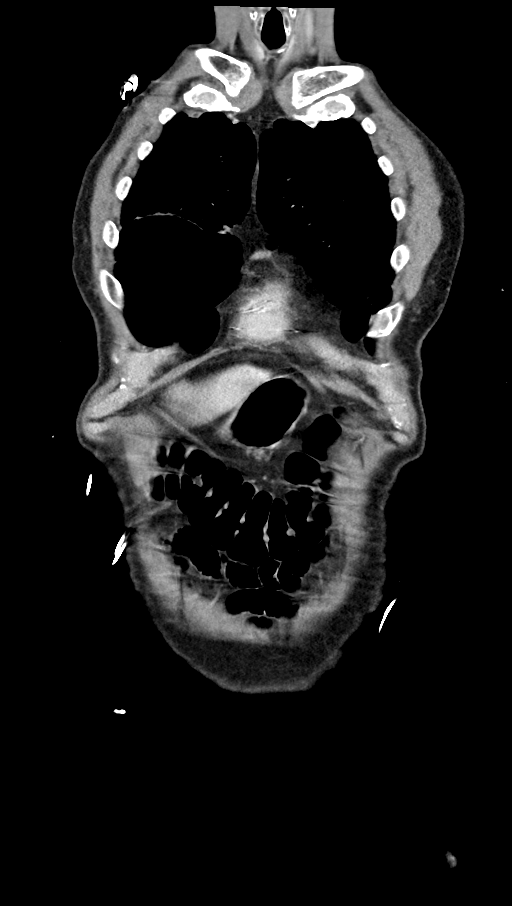
[im 34/85  mediastinal]
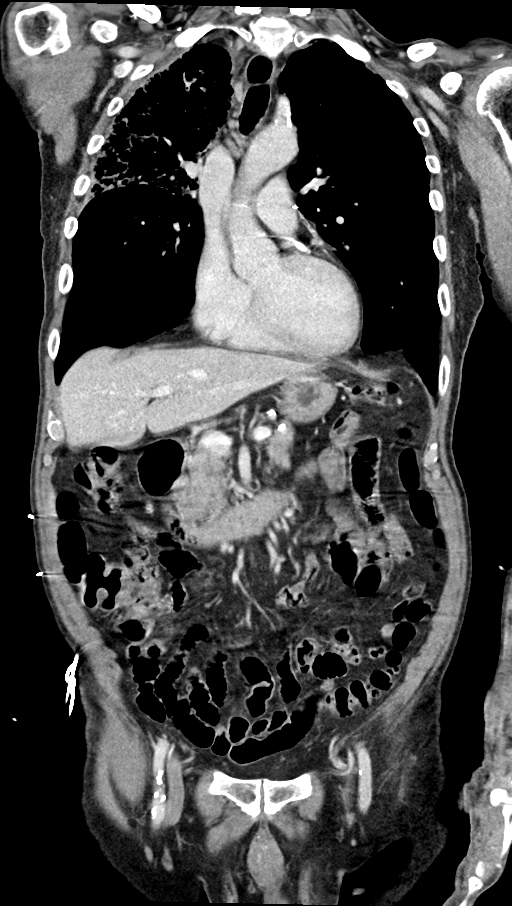
[im 51/85  mediastinal]
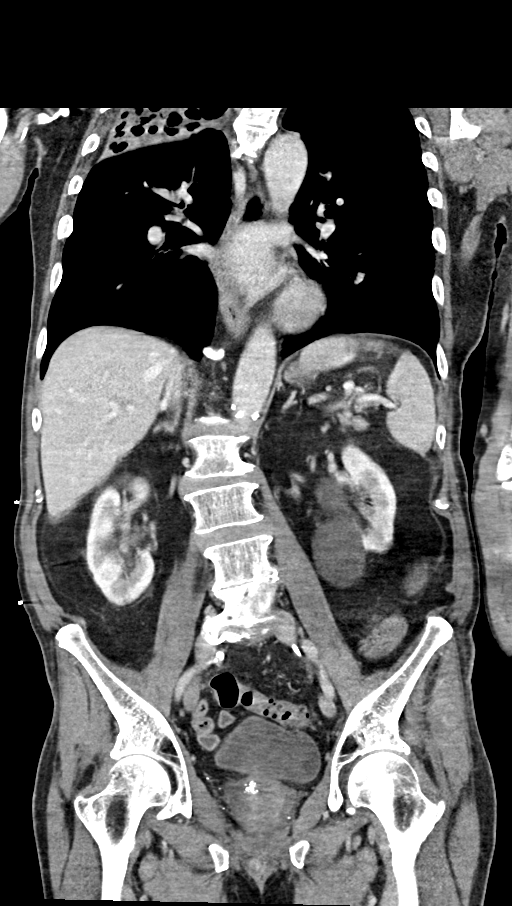

[14 of 36 positions shown; findings below may reference images not displayed]

RADIATION DOSE REDUCTION: This exam was performed according to the
departmental dose-optimization program which includes automated
exposure control, adjustment of the mA and/or kV according to
patient size and/or use of iterative reconstruction technique.

CONTRAST:  80mL OMNIPAQUE IOHEXOL 350 MG/ML SOLN
FINDINGS: CT CHEST FINDINGS

Cardiovascular: Diffuse aortic atherosclerosis. Heart is normal
size. Aorta is normal caliber. Prior CABG. Small right lower lobe
pulmonary embolus again noted as seen on prior CT.

Mediastinum/Nodes: Small scattered mediastinal lymph nodes, none
pathologically enlarged. No mediastinal, hilar, or axillary
adenopathy. Trachea and esophagus are unremarkable. Thyroid
unremarkable.

Lungs/Pleura: Moderate centrilobular emphysema. Continued extensive
airspace disease throughout the right upper lobe, now also seen in
the left upper lobe compatible with pneumonia. No effusions.

Musculoskeletal: Chest wall soft tissues are unremarkable. No acute
bony abnormality.

CT ABDOMEN PELVIS FINDINGS

Hepatobiliary: Small gallstones layering within the gallbladder,
unchanged. No focal hepatic abnormality.

Pancreas: No focal abnormality or ductal dilatation.

Spleen: Areas of wedge-shaped low densities throughout the spleen
compatible with splenic infarcts.

Adrenals/Urinary Tract: Bilateral renal simple cysts. Left
nephrolithiasis. No ureteral stones or hydronephrosis. Urinary
bladder unremarkable.

Stomach/Bowel: Normal appendix. Sigmoid diverticulosis. No active
diverticulitis. Stomach and small bowel decompressed, unremarkable.

Vascular/Lymphatic: Heavily calcified aorta and iliac vessels. No
evidence of aneurysm or adenopathy.

Reproductive: No visible focal abnormality.

Other: No free fluid or free air.

Musculoskeletal: Degenerative changes in the lumbar spine. No acute
bony abnormality.
IMPRESSION: Continued extensive consolidation in the right upper lobe. New areas
of consolidation in the left upper lobe. Findings compatible with
pneumonia.

Moderate to severe emphysema.

Areas of wedge-shaped low-density throughout the spleen compatible
with splenic infarcts. These are better seen on today's study than
prior study but at least some of these were present on prior study.

Cholelithiasis.

Left nephrolithiasis.

Sigmoid diverticulosis.

## 2021-07-29 IMAGING — DX DG CHEST 1V PORT
1 series · 2 of 2 positions shown · non-contrast
Comparison: Chest x-ray dated [DATE]

CLINICAL DATA: Weakness

EXAM:
PORTABLE CHEST 1 VIEW

[Series 1: chest · 0.14mm/px · 2 of 2 slices shown]
[im 1/2]
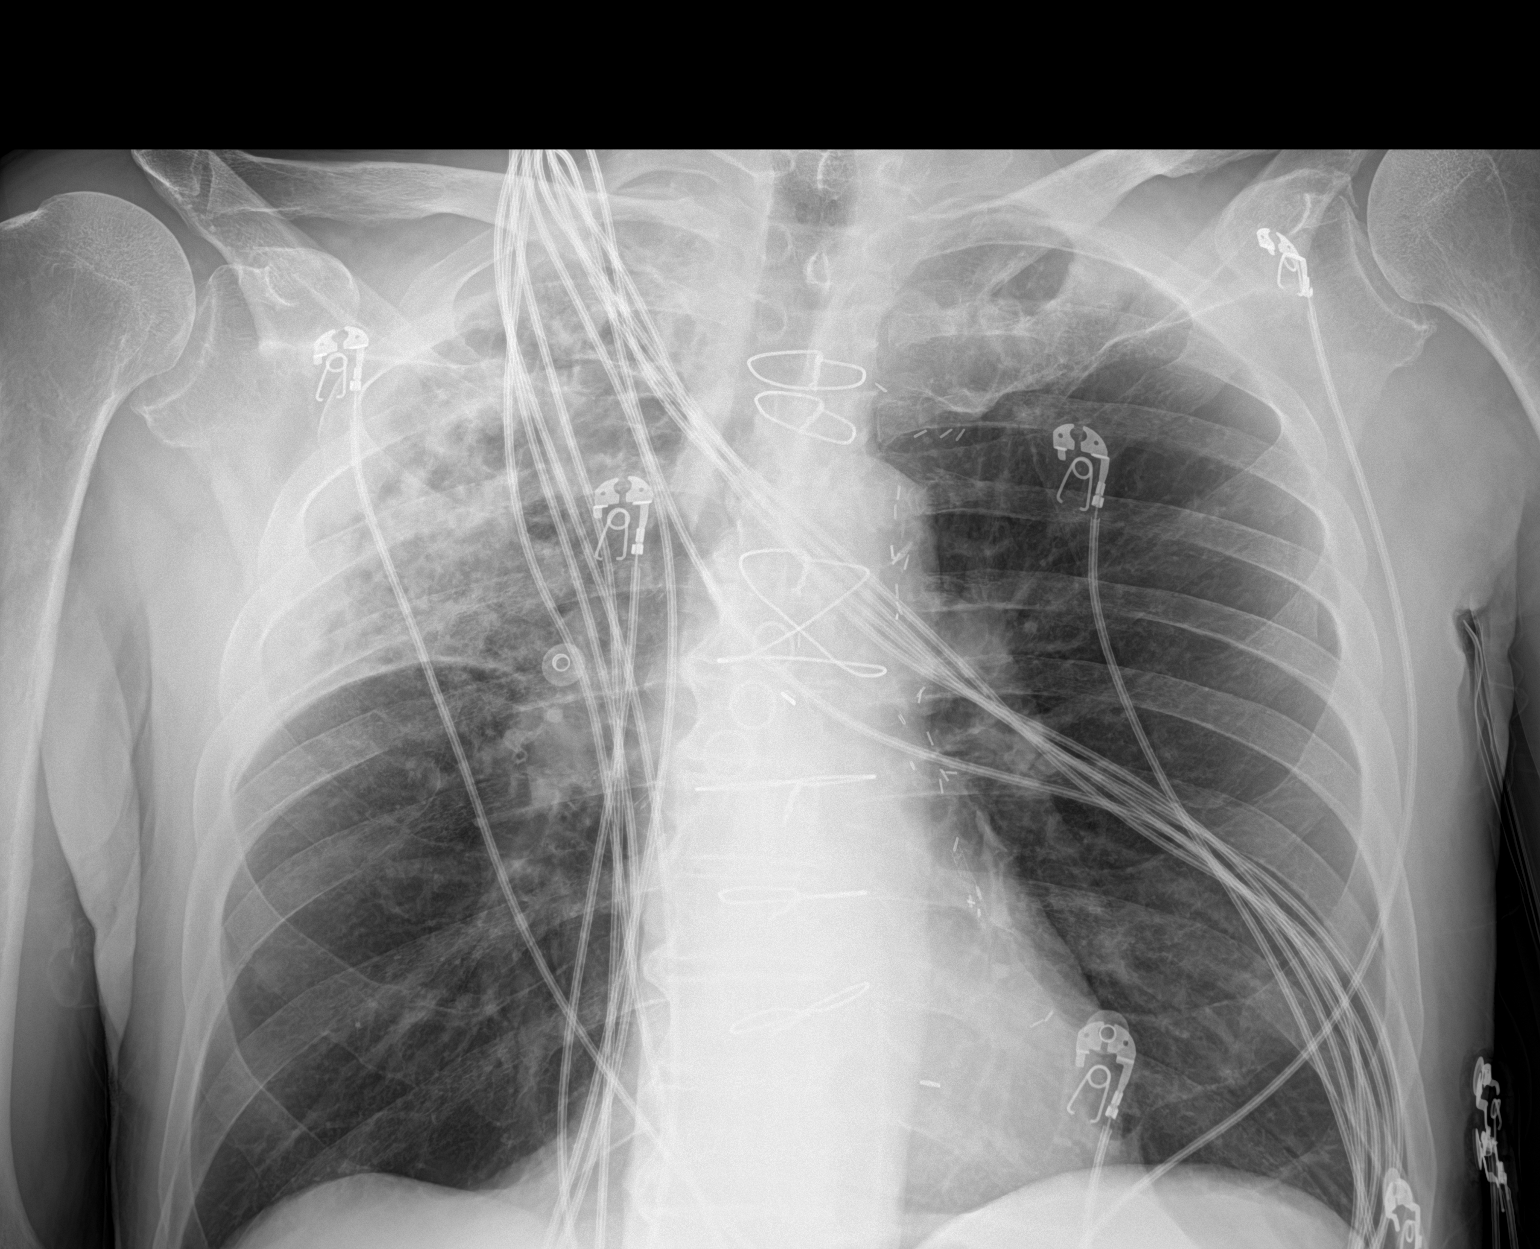
[im 2/2]
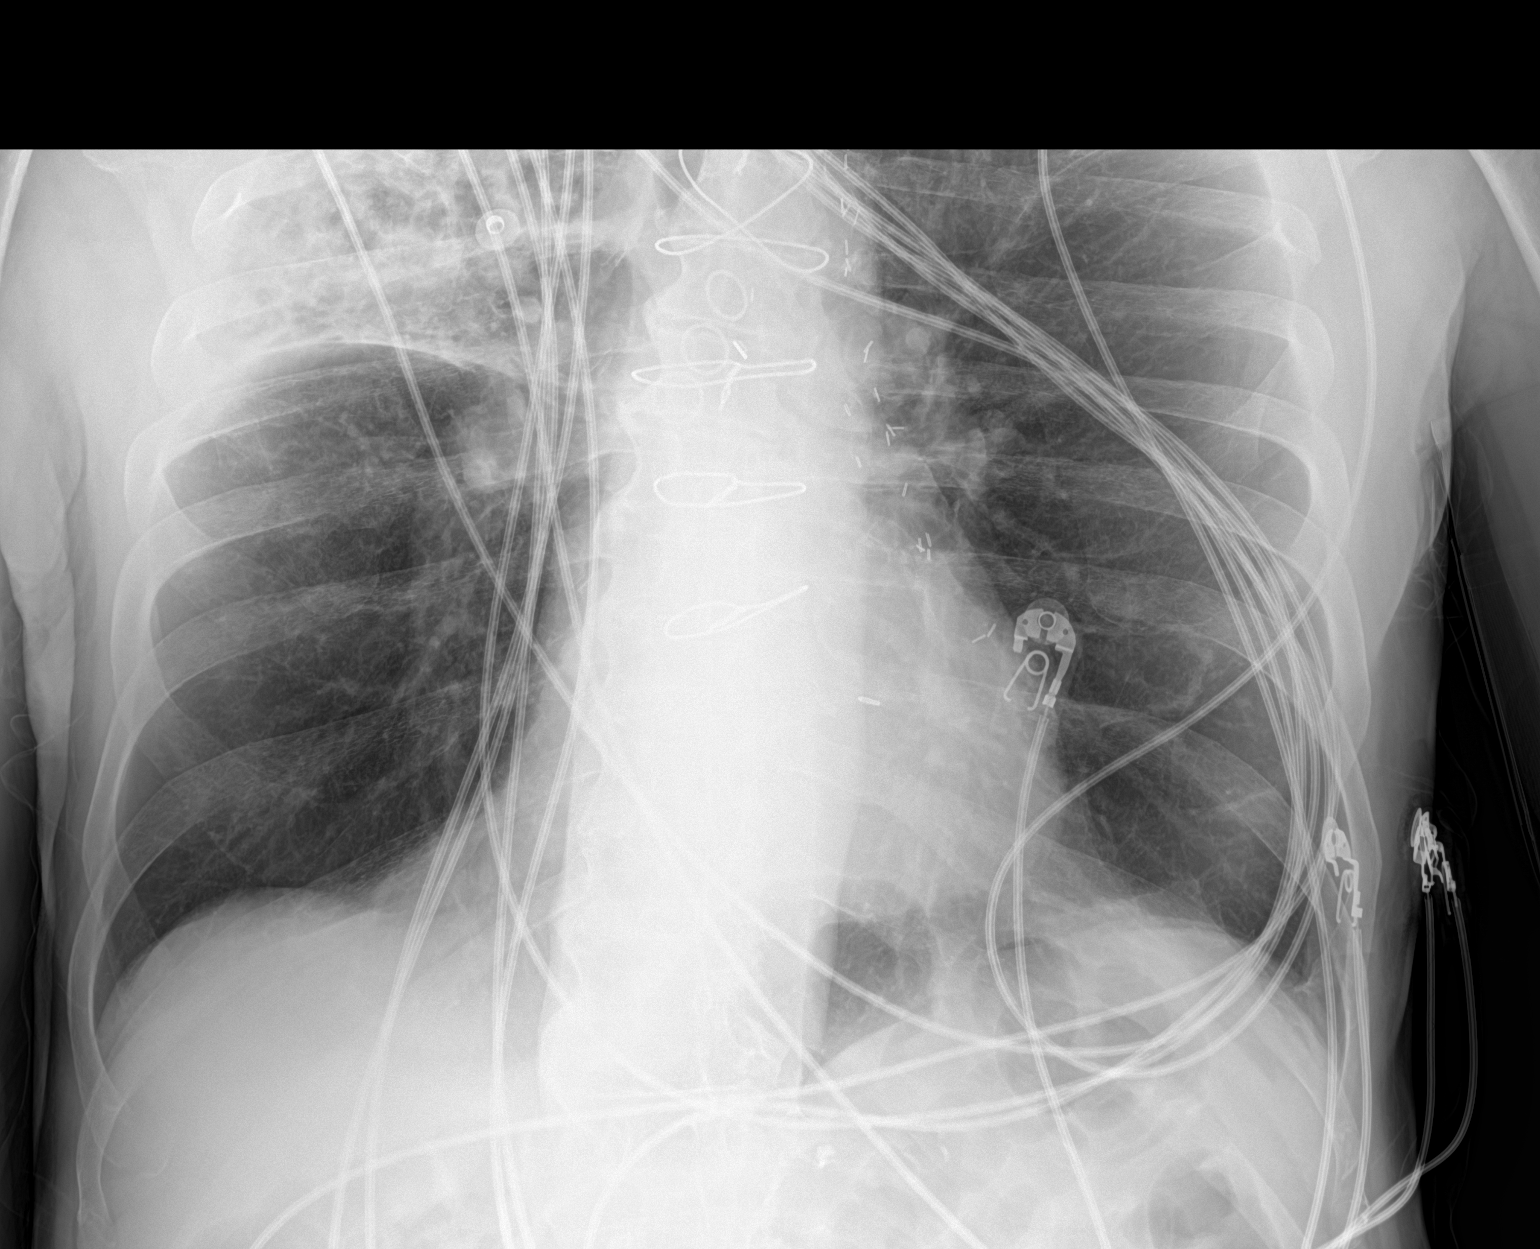

[2 of 2 positions shown; findings below may reference images not displayed]

FINDINGS: Cardiac and mediastinal contours are unchanged within normal limits
status post sternotomy and CABG. Unchanged consolidation of the
right upper lobe. No new parenchymal opacity. No large pleural
effusion or pneumothorax.
IMPRESSION: Unchanged right upper lobe consolidation. No new parenchymal
opacity. Recommend radiographic follow-up to ensure resolution.

## 2021-07-29 MED ORDER — HEPARIN (PORCINE) 25000 UT/250ML-% IV SOLN
1000.0000 [IU]/h | INTRAVENOUS | Status: DC
  Administered 2021-07-30: 1000 [IU]/h via INTRAVENOUS
  Filled 2021-07-29: qty 250

## 2021-07-29 MED ORDER — SENNOSIDES-DOCUSATE SODIUM 8.6-50 MG PO TABS: 1.0000 | ORAL_TABLET | Freq: Every evening | ORAL | Status: DC | PRN

## 2021-07-29 MED ORDER — ACETAMINOPHEN 650 MG RE SUPP: 650.0000 mg | Freq: Four times a day (QID) | RECTAL | Status: DC | PRN

## 2021-07-29 MED ORDER — SODIUM CHLORIDE 0.9 % IV BOLUS
500.0000 mL | Freq: Once | INTRAVENOUS | Status: AC
  Administered 2021-07-29: 500 mL via INTRAVENOUS

## 2021-07-29 MED ORDER — ROSUVASTATIN CALCIUM 20 MG PO TABS
20.0000 mg | ORAL_TABLET | Freq: Every day | ORAL | Status: DC
  Administered 2021-07-30: 20 mg via ORAL
  Filled 2021-07-29: qty 1

## 2021-07-29 MED ORDER — ACETAMINOPHEN 325 MG PO TABS
650.0000 mg | ORAL_TABLET | Freq: Four times a day (QID) | ORAL | Status: DC | PRN
Start: 2021-07-29 — End: 2021-07-30

## 2021-07-29 MED ORDER — ATENOLOL 25 MG PO TABS
50.0000 mg | ORAL_TABLET | Freq: Every day | ORAL | Status: DC
  Administered 2021-07-30 – 2021-07-31 (×2): 50 mg via ORAL
  Filled 2021-07-29 (×2): qty 2

## 2021-07-29 NOTE — ED Notes (Signed)
Patient transported to MRI 

## 2021-07-29 NOTE — ED Notes (Signed)
spoke with charge upstairs and stated ok to send him Up!  We are going to CT fist for CT Angios

## 2021-07-29 NOTE — Telephone Encounter (Signed)
Patients daughter [name] called she is requesting a call back [phone]

## 2021-07-29 NOTE — Consult Note (Signed)
Neurology Consultation Reason for Consult: Worsening right sided weakness Requesting Physician: Aletta Edouard  CC:   History is obtained from: Patient family and chart review   HPI: Xavier Giles is a 81 y.o. 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  He had initially presented on 5/17 with pleuritic chest pain and fever, improved during the hospital stay, but then on discharge was declining again and represented on 5/23 with fever and concern for worsening pneumonia.  He also had altered mental status for which neurology was consulted.  MRI brain revealed multifocal strokes concerning for a central embolic source.  He was additionally found to have a PE and started on anticoagulation.  TEE was performed and was negative for vegetations, blood cultures were also negative.  Family reports that since discharge she has been suicidal, asking for his gun and then upset when it was unloaded, hiding scissors in his pants with intent to harm himself, and refusing to eat.  However he has been agreeing to take his anticoagulation and they do not have any concern that he missed any doses of his apixaban.  They note that he has had worsening right-sided weakness since at least Sunday.  They deny any fevers, but noted he has been making odd blowing's movements when he is breathing without any clear shortness of breath.  He has also been sticking his tongue out intermittently and licking his lips frequently as well as touching the right side of his face frequently with his left hand.  They note he has denied any pain to them  LKW: 5/20 tPA given?: No, on  anticoagulation, recent stroke, out of the window Premorbid modified rankin scale:      4 - Moderately severe disability. Unable to attend to own bodily needs without assistance, and unable to walk unassisted.   ROS: Unable to obtain due to altered mental status.   Past Medical History:  Diagnosis Date   Asthma    CAD (coronary artery disease) of bypass graft 2003   (now known CTO of SVG-rPDA & SVG-D1 & 3 stents in SeqSVg-Om1-OM2 (1 in prox leg, overlapping DES-BMS from OM1-OM2 into OM2 - s/p PTCA for ISR).; LIMA-LAD patent    CAD S/P PTCA-PCI of SeqSVG-OM1-OM2 2003; 01/2012; 05/2013   a) '03 SeqSVG-OM1-OM2,  Zeta BMS 3.0x8 Seq limb into OM2), & PTCA of SVg-D1 anastomosis; b) 2013: Distal Limb of SeqSVG-OM1-OM2 from OM1-2 overlap BMS ->Xience DES 2.75 x 15 - 108m;; c) 05/2013 Proxlimb SVG--OM1-OM2 -> Resolute DES 3 x 26 - 3.218m Angiosculpt PTCA of ISR in Seq Limb overlaping stents (3.25 mm)   CAD, multiple vessel - Native CAD. 1998; 2015   a) Referred for CABGx5 ; by Cath 05/2013: dLM 60%, Ost LAD 70&. Prox -mid LAD extensive 70%( to-fro flow); D1 80%; OM1 & OM2 (CTO), follow-on LCx beyond AVG 90+% with small OM3/OM4; RCA TO (R-R collaterals);   Carotid bruit present 01/2012   a. 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 Also noted CTO of SVG-Diag; b) CRESCENDO ANGINA: 05/2013 - prox limb SVG-OM1-OM2 DES PCI & PTCA of 95% ISR in distal Limb overlapping DES-BMS; Myoview 08/25/13 - small non-T-mural Inf scar, No Ischemia   S/P CABG x 5 1998    LIMA-LAD, SeqSVG-OM1-OM2; SVG-rPDA, SVG-D1    Past Surgical History:  Procedure Laterality Date   BUBBLE STUDY  07/25/2021   Procedure: BUBBLE STUDY;  Surgeon: Skeet Latch, MD;  Location: [place];  Service: Cardiovascular;;   CATARACT EXTRACTION Bilateral    Dr. Dolores Lory   CATARACT EXTRACTION W/ INTRAOCULAR LENS  IMPLANT, BILATERAL Bilateral    [place]  2003   DR LITTLE: BMS 3.0 mm x 8 mm stent to Seq limb anastomosis of SeqSVG-OM1-OM2 into OM2; & cutting balloon SVG-Diag   CORONARY ANGIOPLASTY WITH STENT PLACEMENT  06/28/2013   Procedure: CORONARY ANGIOPLASTY WITH STENT PLACEMENT ;  Surgeon: Troy Sine, MD;  Location: [facility] CATH LAB;  Service: Cardiovascular: PCI to SVG-CxOM1-OM2 99% prox lesion (Resolute DES 3.0 x 26 - 3.4 mm) & PTCA of  95% ISR in sequential limb (3.0 mm Angiosculpt)    CORONARY ARTERY BYPASS GRAFT  1998   SVG-diagonal, DVG-OM, DVG-PDA, LIMA-LAD   CORONARY STENT INTERVENTION  01/13/2012     Procedure: CORONARY STENT INTERVENTION ;  Surgeon: Lorretta Harp, MD;  Location[facility] CATH LAB;  Service: Cardiovascular: PCI Seq limb of Seq SVG-OM1-OM2 90% -> 0% - Xience Xpedition DES 2.75 MM x15 MM (3 MM) overlaps prox edge of BMS into OM2   ESOPHAGOGASTRODUODENOSCOPY N/A 06/29/2013   Procedure: ESOPHAGOGASTRODUODENOSCOPY (EGD);  Surgeon: Beryle Beams, MD;  Location: [facility] ENDOSCOPY;  Service: Endoscopy;  Laterality: N/A;   EYE SURGERY Bilateral    Cat Sx - Dr. Dolores Lory   LEFT HEART CATH AND CORS/GRAFTS ANGIOGRAPHY  2003   ABN CARDIOLITE:  100% SVG-PDA, patent LIMA; 80% anastomotic lesion SVG-Diag; 99% anastomotic lesion of Seq limb SVG-OM1-OM2; Native LM OK - 50% & 80% prox LAD, 80% D1; Native OM1 & OM2 TO; Native RCA CTO (R_R collaterals). => PCI of SVG-OM2 & PTCA of SVG-Diag   LEFT HEART CATHETERIZATION WITH CORONARY/GRAFT ANGIOGRAM N/A 01/13/2012   Procedure: LEFT HEART CATHETERIZATION WITH Beatrix Fetters;  Surgeon: Lorretta Harp, MD;  Location: [facility] CATH LAB;  Service: Cardiovascular: SVG-D1 CTO (new), knosn SVG-RCA CTO.  Native mRCA CTO (R-R collaterals); 50% distal LM, 70-80% prox LAD and 90% mid.  LIMA-LAD patent w/  70% post anastomosis. OM1 &OM2 CTO. Seq limb of SeqSVG-OM1-OM2 90% @ OM1 to BMS into OM2.   LEFT HEART CATHETERIZATION WITH CORONARY/GRAFT ANGIOGRAM N/A 06/28/2013   Procedure: LEFT HEART CATHETERIZATION WITH Beatrix Fetters;  Surgeon: Troy Sine, MD;  Location: [facility] CATH LAB;  Service: Cardiovascular: Cres Angina: a) 60% dLM; 70% Ostial LAD; 90% Cx - no OMs seen; 100% RCA;; SVG-D1 & SVG--dRCA occluded; Patent LIMA-LAD with dLAD ~70-80%;; SeqSVG-OM1-OM2: 95% proximal limb, 50% mid & Diffuse ISR in overlapping DES/BMS in Seq Limb OM-OM2   NM MYOVIEW LTD  07/2013   Test looked good!! Evidence of possible old MI / scar, but nothing to suggest ischemia. Not Gated b/c ectopy .   NM MYOVIEW LTD  06/2017   Intermediate risk because of reduced EF but  stable.  Stable fixed inferior defect consistent with either diaphragmatic attenuation or inferior infarct.  Septal hypokinesis/dyskinesis due to LBBB and prior CABG.  EF is 46%.  Stable compared to 2015 following PCI   TEE WITHOUT CARDIOVERSION N/A 07/25/2021   Procedure: TRANSESOPHAGEAL ECHOCARDIOGRAM (TEE);  Surgeon: Skeet Latch, MD;  Location: [facility] ENDOSCOPY;  Service: Cardiovascular;  Laterality: N/A;   TONSILLECTOMY     Current Outpatient Medications  Medication Instructions   amoxicillin-clavulanate (AUGMENTIN) 875-125 MG tablet 1 tablet, Oral, 2 times daily   apixaban (ELIQUIS) 5 mg, Oral, 2 times daily   atenolol (TENORMIN) 50 mg, Oral, Daily   isosorbide mononitrate (IMDUR) 60 MG 24 hr tablet Take 1 tablet by mouth once daily   rosuvastatin (CRESTOR) 20 MG tablet Take 1 tablet by mouth once daily   Family History  Problem Relation Age of Onset   Cancer Mother    Heart attack Father    Social History:  reports that he quit smoking about  31 years ago. His smoking use included cigarettes. He has a 60.00 pack-year smoking history. He has never used smokeless tobacco. He reports that he does not currently use alcohol. He reports that he does not use drugs.   Exam: Current vital signs: BP (!) 133/54   Pulse 99   Temp 97.9 F (36.6 C) (Oral)   Resp (!) 22   SpO2 97%  Vital signs in last 24 hours: Temp:  [97.9 F (36.6 C)] 97.9 F (36.6 C) (05/30 1547) Pulse Rate:  [86-99] 99 (05/30 1946) Resp:  [17-25] 22 (05/30 1946) BP: (121-149)/(54-82) 133/54 (05/30 1946) SpO2:  [94 %-100 %] 97 % (05/30 1946)   Physical Exam  Constitutional: Appears thin but no acute distress Psych: Affect , calm, mildly frustrated secondary to his inability to hear me Eyes: No scleral injection HENT: No oropharyngeal obstruction.  MSK: no joint deformities.  Cardiovascular: Perfusing extremities well Respiratory: Effort normal, non-labored breathing, intermittently persistent slips and blows out like blowing a candle GI: Soft.  No distension. There is no tenderness.  Skin: Some bruising on the left side from his fall at home  Neuro: Mental Status: Examination extremely limited by patient's hearing loss.  He is able to communicate brief sentences of things he wants, but at times struggles with his words and becomes slightly more frustrated.  He cannot hear me well enough to follow any commands reliably, but does mimic and interact and participate Cranial Nerves: II: Visual Fields are notable for right hemianopia by blink to threat. Pupils are equal, round, and reactive to light.   III,IV, VI: Conservation officer, nature in all directions with slightly limited upgaze V: Facial sensation is symmetric to light eyelash brush VII: Facial movement is notable for mild right facial droop VIII: Nearly deaf at baseline Remainder unable to assess given deafness plus aphasia Motor: He can briefly lift his right arm antigravity, but it is noticeably weaker than my  prior evaluation.  His left arm can maintain antigravity at least 10 seconds.  His left leg can maintain antigravity at least 5 seconds.  His right leg is noticeably slightly weaker than his left, but at best he does repeatedly briefly lift it antigravity Sensory: Appears to react to touch in all 4 extremities Deep Tendon Reflexes: 2+ and symmetric in the brachioradialis and patellae.  Plantars: Toes are upgoing on the right, mute on the left Cerebellar: Unable to assess secondary to deafness  Gait:  Deferred  NIHSS total 12 --though extremely hampered by his  deafness Score breakdown: 2 points for not answering questions correctly, 2 points for not following commands, 2 points for right hemianopia, one-point for right facial droop, 2 points for right arm weakness, 2 points for right leg weakness, one-point for moderate aphasia Performed at approximately 8:30 PM     I have reviewed labs in epic and the results pertinent to this consultation are:  Basic Metabolic Panel: Recent Labs  Lab 07/23/21 0330 07/24/21 0138 07/25/21 0759 07/26/21 0353 07/29/21 1620  NA 134* 132* 136 133* 137  K 3.8 4.0 3.6 3.7 3.7  CL 103 102 105 102 103  CO2 23 22 19* 20* 24  GLUCOSE 195* 148* 139* 191* 113*  BUN '18 21 21 21 17  '$ CREATININE 1.02 1.07 1.00 1.02 1.10  CALCIUM 8.2* 8.1* 7.8* 7.7* 8.3*  MG  --   --   --  2.2  --     CBC: Recent Labs  Lab 07/23/21 0330 07/24/21 0138 07/25/21 0759 07/26/21 0353 07/29/21 1620  WBC 9.9 11.8* 11.6* 13.5* 11.3*  NEUTROABS  --   --   --   --  8.5*  HGB 12.4* 13.3 14.4 14.5 14.2  HCT 37.9* 41.6 44.0 43.5 43.7  MCV 86.7 86.8 85.4 83.8 85.5  PLT 259 271 269 291 323    Coagulation Studies: No results for input(s): LABPROT, INR in the last 72 hours.    I have reviewed the images obtained:  MRI brain personally reviewed, agree with radiology overall, although on my review there are additionally punctate foci of new ischemia in the right hemisphere as  well: This was reviewed by radiology, who confirmed my impression and stated they would addend the report 1. Interval expansion of acute ischemic changes involving the posterior left cerebral hemisphere, with multiple new areas of ischemia involving the left parieto-occipital region. Associated petechial hemorrhage without frank hemorrhagic transformation. 2. Otherwise normal expected interval evolution of previously identified multifocal ischemic infarcts involving the left greater than right cerebral and cerebellar hemispheres. 3. Circumferential T2/FLAIR signal abnormality about the distal cervical left ICA, new from prior. Finding could reflect changes of a proximal stenosis or possibly dissection. Further evaluation with dedicated CTA and/or MRA suggested for further evaluation. 4. Underlying atrophy with chronic ischemic changes as above.  Impression: This is an 81 year old gentleman with past medical history as above, presenting with suicidal ideation, found to have worsening stroke on MRI brain.  New strokes are predominantly in the left ICA distribution, but there are punctate foci also on the right side concerning for ongoing central embolic source versus hypercoagulable state in the setting of possible malignancy.  Discussed hemorrhage risk with family in the setting of ongoing anticoagulation.  As his neurological symptoms worsened 2 days ago and have been relatively stable today (though hard to be sure given his variable effort in the setting of his psychiatric state), and given highly hypercoagulable state as evidenced by further strokes on apixaban, I believe the risks of starting heparin drip will be outweighed by the benefits.  Recommendations: # Recurrent multifocal stroke - CTA head and neck to clarify MRI brain L ICA finding - CT chest/abdomen/pelvis w/ contrast to assess for occult malignancy - Frequent neuro checks - Risk factor modification - Telemetry monitoring; 30 day  event monitor on discharge if no arrythmias captured  - Blood pressure goal   - Normotension, out of the permissive hypertension window - Anticogaulation w/ heparin low goal no bolus protocol given apixaban failure (and for ability to quickly  stop drip and reverse in case of ICH) - PT consult, OT consult, Speech consult, unless patient is back to baseline - Stroke team to follow - Consider psychiatry consult - Appreciate management of remainder of comorbidities by primary team  Lesleigh Noe MD-PhD [facility] [phone] Available 7 PM to 7 AM, outside of these hours please call Neurologist on call as listed on Amion.

## 2021-07-29 NOTE — Progress Notes (Addendum)
ANTICOAGULATION CONSULT NOTE - Initial Consult  Pharmacy Consult for Heparin Indication:  CVA  Allergies  Allergen Reactions   Keflex [Cephalexin] Other (See Comments)    Unknown ;   Patient states he is not aware of being allergic to cephalexin per [facility]MT[facility] resident MD report.     Zocor [Simvastatin] Other (See Comments)    Unknown    Latex Rash   Lipitor [Atorvastatin] Other (See Comments)    Elevated blood sugar   Sulfa Antibiotics Nausea Only    Patient Measurements: Height: '5\' 11"'$  (180.3 cm) Weight: 77.3 kg (170 lb 6.7 oz) IBW/kg (Calculated) : 75.3 Heparin Dosing Weight: 77.3 kg  Vital Signs: Temp: 98.4 F (36.9 C) (05/30 2210) Temp Source: Axillary (05/30 2210) BP: 135/67 (05/30 2200) Pulse Rate: 88 (05/30 2200)  Labs: Recent Labs    07/29/21 1620  HGB 14.2  HCT 43.7  PLT 323  CREATININE 1.10    Estimated Creatinine Clearance: 55.1 mL/min (by C-G formula based on SCr of 1.1 mg/dL).   Medical History: Past Medical History:  Diagnosis Date   Asthma    CAD (coronary artery disease) of bypass graft 2003   (now known CTO of SVG-rPDA & SVG-D1 & 3 stents in SeqSVg-Om1-OM2 (1 in prox leg, overlapping DES-BMS from OM1-OM2 into OM2 - s/p PTCA for ISR).; LIMA-LAD patent    CAD S/P PTCA-PCI of SeqSVG-OM1-OM2 2003; 01/2012; 05/2013   a) '03 SeqSVG-OM1-OM2,  Zeta BMS 3.0x8 Seq limb into OM2), & PTCA of SVg-D1 anastomosis; b) 2013: Distal Limb of SeqSVG-OM1-OM2 from OM1-2 overlap BMS ->Xience DES 2.75 x 15 - 20m;; c) 05/2013 Proxlimb SVG--OM1-OM2 -> Resolute DES 3 x 26 - 3.250m Angiosculpt PTCA of ISR in Seq Limb overlaping stents (3.25 mm)   CAD, multiple vessel - Native CAD. 1998; 2015   a) Referred for CABGx5 ; by Cath 05/2013: dLM 60%, Ost LAD 70&. Prox -mid LAD extensive 70%( to-fro flow); D1 80%; OM1 & OM2 (CTO), follow-on LCx beyond AVG 90+% with small OM3/OM4; RCA TO (R-R collaterals);   Carotid bruit present 01/2012   a. 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 Also noted CTO of SVG-Diag; b) CRESCENDO ANGINA: 05/2013 - prox limb SVG-OM1-OM2 DES PCI & PTCA of 95% ISR in distal Limb overlapping DES-BMS; Myoview 08/25/13 - small non-T-mural Inf scar, No Ischemia   S/P CABG x 5 1998   LIMA-LAD, SeqSVG-OM1-OM2; SVG-rPDA, SVG-D1      Assessment: [age]o M presenting with recurrent multifocal stroke. He was recently admitted and discharged on 5/27 with Eliquis for RLL PE and acute DVT of LLE.  Last PTA Eliquis dose on 5/30 at ~0800.  Baseline Hgb 14.2, plt 323.  No signs/symptoms of bleeding noted.  Pharmacy consulted for heparin dosing.   Goal of Therapy:  Heparin level 0.3-0.5 units/ml aPTT 66-85 seconds Monitor platelets by anticoagulation protocol: Yes   Plan:  No bolus. Start heparin infusion at 1000 units/hr. Check ~8 hr heparin level, aPTT.  Daily CBC, heparin level,  aPTT. Monitor for signs/symptoms of bleeding.   Vance Peper, PharmD PGY1 Pharmacy Resident 07/29/2021 10:37 PM   Please check AMION for all [place] phone numbers After 10:00 PM, call [place] [phone]

## 2021-07-29 NOTE — H&P (Signed)
Date: 07/29/2021               Patient Name:  Xavier Giles MRN: [id number]  DOB: [birth year] Age / Sex: 81 y.o., male   PCP: [name], MD         Medical Service: Internal Medicine Teaching Service         Attending Physician: Dr. Johnnye Sima Doroteo Bradford, MD    First Contact: Mitzie Na, MD Pager: MB [phone]  Second Contact: Gaylan Gerold, DO Pager: Liliane Shi [phone]       After Hours (After 5p/  First Contact Pager: [phone]  weekends / holidays): Second Contact Pager: [phone]   SUBJECTIVE  Chief Complaint: SI  History of Present Illness: Xavier Giles is a 81 y.o. 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  The patient's daughter, [name], reports that since his discharge, the patient has been suicidal and was asking for his gun. The patient has also been refusing to eat over this time period and was found to have scissors in his pants, with intent to harm himself. The patient's daughter reports that he has been taking his Eliquis twice daily and has not missed any doses. This is the only medication he has been taking since his discharge. She also reports that today, the patient seemed worse than he had been the last couple of days. She describes that he had "given up", stating that he was not able to get out of bed, was no longer able to walk with his walker, nor was he able to put on his clothes. She is unsure if he physically was unable to do these tasks or if he did not want to. She does also endorse that the patient's speech appears to be more slurred than it was previously and that he had more difficulty with word finding.   When asking the patient what brought him to the hospital, he repeated "I don't know ma'am, I can't hear good" many times. The patient is unable to answer orientation questions   In the ED, the  patient was found to have interval expansion of acute ischemic changes involving the posterior left cerebral hemisphere with multiple new areas of ischemia in the left parieto-occipital region. There was also petechial hemorrhage noted, without any evidence of hemorrhagic transformation. Neuro was consulted for new CVA and IMTS was consulted for admission.   Medications:  No current facility-administered medications on file prior to encounter.   Current Outpatient Medications on File Prior to Encounter  Medication Sig Dispense Refill   amoxicillin-clavulanate (AUGMENTIN) 875-125 MG tablet Take 1 tablet by mouth 2 (two) times daily for 2 days. (Patient taking differently: Take 1 tablet by mouth See admin instructions. Bid x 2 days) 4 tablet 0   apixaban (ELIQUIS) 5 MG TABS tablet Take 1 tablet (5 mg total) by mouth 2 (two) times daily. 60 tablet 2   atenolol (TENORMIN) 50 MG tablet Take 1 tablet (50 mg total) by mouth daily. (Patient not taking: Reported on 07/29/2021) 90 tablet 3   isosorbide mononitrate (IMDUR) 60 MG 24 hr tablet Take 1 tablet by mouth once daily (Patient not taking: Reported on 07/29/2021) 30 tablet 5   rosuvastatin (CRESTOR) 20 MG tablet Take 1 tablet by mouth once daily (Patient not taking: Reported on 07/29/2021) 90 tablet 3  Did not finish  Augmentin  Has only been taking eliquis   Past Medical History:  Past Medical History:  Diagnosis Date   Asthma    CAD (coronary artery disease) of bypass graft 2003   (now known CTO of SVG-rPDA & SVG-D1 & 3 stents in SeqSVg-Om1-OM2 (1 in prox leg, overlapping DES-BMS from OM1-OM2 into OM2 - s/p PTCA for ISR).; LIMA-LAD patent    CAD S/P PTCA-PCI of SeqSVG-OM1-OM2 2003; 01/2012; 05/2013   a) '03 SeqSVG-OM1-OM2,  Zeta BMS 3.0x8 Seq limb into OM2), & PTCA of SVg-D1 anastomosis; b) 2013: Distal Limb of SeqSVG-OM1-OM2 from OM1-2 overlap BMS ->Xience DES 2.75 x 15 - 22m;; c) 05/2013 Proxlimb SVG--OM1-OM2 -> Resolute DES 3 x 26 - 3.256m  Angiosculpt PTCA of ISR in Seq Limb overlaping stents (3.25 mm)   CAD, multiple vessel - Native CAD. 1998; 2015   a) Referred for CABGx5 ; by Cath 05/2013: dLM 60%, Ost LAD 70&. Prox -mid LAD extensive 70%( to-fro flow); D1 80%; OM1 & OM2 (CTO), follow-on LCx beyond AVG 90+% with small OM3/OM4; RCA TO (R-R collaterals);   Carotid bruit present 01/2012   a. 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 Also noted CTO of SVG-Diag; b) CRESCENDO ANGINA: 05/2013 - prox limb SVG-OM1-OM2 DES PCI & PTCA of 95% ISR in distal Limb overlapping DES-BMS; Myoview 08/25/13 - small non-T-mural Inf scar, No Ischemia   S/P CABG x 5 1998   LIMA-LAD, SeqSVG-OM1-OM2; SVG-rPDA, SVG-D1     Social:  Lives - son has been living with patient during the evenings and granddaughter staying with him during day (was previously living alone prior to his CVA).  Level of function - Ambulates with walker since his stroke.  [facility] Previous tobacco use with 1.5 ppd for 30 years. No alcohol or illicit substance use.   Family History: Family History  Problem Relation Age of Onset   Cancer Mother    Heart attack Father     Allergies: Allergies as of 07/29/2021 - Review Complete 07/29/2021  Allergen Reaction Noted   Keflex [cephalexin] Other (See Comments) 01/13/2012   Zocor [simvastatin] Other (See Comments) 01/13/2012   Latex Rash 07/29/2021   Lipitor [atorvastatin] Other (See Comments) 01/13/2012   Sulfa antibiotics Nausea  Only 01/13/2012    Review of Systems: Unable to obtain complete ROS as patient has difficulty hearing/understanding at this time.   OBJECTIVE:  Physical Exam: Blood pressure 135/67, pulse 88, temperature 98.4 F (36.9 C), temperature source Axillary, resp. rate 19, height '5\' 11"'$  (1.803 m), weight 77.3 kg, SpO2 95 %.  General: Elderly male laying in bed. No acute distress. Hard of hearing.  Head: Normocephalic. Atraumatic. CV: RRR. No murmurs, rubs, or gallops. No LE edema Pulmonary: Lungs CTAB. Normal effort. No wheezing or rales. Abdominal: Soft, nontender, nondistended. Normal bowel sounds. Extremities: Palpable radial and DP pulses. Normal ROM. Skin: Warm and dry. Some scattered ecchymosis.  Neuro: Neuro exam is very limited 2/2 to patient's  hearing loss and inability to follow commands. Unable to assess orientation at this time. Not following commands (unsure if secondary to difficulty hearing or a true inability to follow commands).  Psych: Depressed mood and affect   Pertinent Labs: CBC    Component Value Date/Time   WBC 11.3 (H) 07/29/2021 1620   RBC 5.11 07/29/2021 1620   HGB 14.2 07/29/2021 1620   HGB 15.2 05/27/2021 0953   HCT 43.7 07/29/2021 1620   HCT 45.9 05/27/2021 0953   PLT 323 07/29/2021 1620   PLT 197 05/27/2021 0953   MCV 85.5 07/29/2021 1620   MCV 85 05/27/2021 0953   MCH 27.8 07/29/2021 1620   MCHC 32.5 07/29/2021 1620   RDW 13.5 07/29/2021 1620   RDW 13.6 05/27/2021 0953   LYMPHSABS 1.2 07/29/2021 1620   MONOABS 1.3 (H) 07/29/2021 1620   EOSABS 0.2 07/29/2021 1620   BASOSABS 0.0 07/29/2021 1620     CMP     Component Value Date/Time   NA 137 07/29/2021 1620   NA 141 05/27/2021 0953   K 3.7 07/29/2021 1620   CL 103 07/29/2021 1620   CO2 24 07/29/2021 1620   GLUCOSE 113 (H) 07/29/2021 1620   BUN 17 07/29/2021 1620   BUN 12 05/27/2021 0953   CREATININE 1.10 07/29/2021 1620   CREATININE 1.06 07/07/2016 0903   CALCIUM 8.3 (L) 07/29/2021 1620    PROT 5.4 (L) 07/29/2021 1620   PROT 6.9 05/27/2021 0953   ALBUMIN 2.3 (L) 07/29/2021 1620   ALBUMIN 4.9 (H) 05/27/2021 0953   AST 44 (H) 07/29/2021 1620   ALT 81 (H) 07/29/2021 1620   ALKPHOS 99 07/29/2021 1620   BILITOT 1.1 07/29/2021 1620   BILITOT 0.5 05/27/2021 0953   GFRNONAA >60 07/29/2021 1620   GFRAA 68 05/23/2019 0852    Pertinent Imaging: MR BRAIN WO CONTRAST  Result Date: 07/29/2021 CLINICAL DATA:  Initial evaluation for mental status change, unknown cause. EXAM: MRI HEAD WITHOUT CONTRAST TECHNIQUE: Multiplanar, multiecho pulse sequences of the brain and surrounding structures were obtained without intravenous contrast. COMPARISON:  Recent MRI from 07/23/2021. FINDINGS: Brain: There has been continued interval evolution of previously identified multifocal ischemic infarcts involving the left greater than right cerebral and cerebellar hemispheres. Since previous exam, there has been interval expansion of acute ischemic changes involving the posterior left cerebral hemisphere, with multiple new areas of ischemia involving the left parieto-occipital region (series 5, images 89, 78). The remainder of the evolving ischemic changes are otherwise relatively similar. Evidence for associated petechial hemorrhage at the posterior left frontoparietal region (series 14, image 37) Heidelberg classification 1a: HI1, scattered small petechiae, no mass effect. No frank hemorrhagic transformation by MRI. Underlying atrophy with chronic microvascular ischemic disease again noted. Few scattered remote cerebellar infarcts noted as well. No mass lesion, significant mass effect, or midline shift. No hydrocephalus or extra-axial fluid collection. Pituitary gland suprasellar region within normal limits. Midline structures intact and normally formed. Vascular: Circumferential T2/FLAIR signal abnormality now seen about the distal cervical left ICA, new from prior (series 10, image 1), and could reflect changes  of a proximal stenosis or possibly dissection. Major intracranial vascular flow voids are otherwise maintained. Skull and upper cervical spine: Craniocervical junction within normal limits. Bone marrow signal intensity normal. No scalp soft tissue abnormality. Sinuses/Orbits: Prior bilateral ocular lens replacement. Paranasal sinuses are clear. Small right greater than left mastoid effusions noted. Other: None. IMPRESSION: 1. Interval expansion of acute ischemic changes involving the posterior left cerebral hemisphere, with  multiple new areas of ischemia involving the left parieto-occipital region. Associated petechial hemorrhage without frank hemorrhagic transformation. 2. Otherwise normal expected interval evolution of previously identified multifocal ischemic infarcts involving the left greater than right cerebral and cerebellar hemispheres. 3. Circumferential T2/FLAIR signal abnormality about the distal cervical left ICA, new from prior. Finding could reflect changes of a proximal stenosis or possibly dissection. Further evaluation with dedicated CTA and/or MRA suggested for further evaluation. 4. Underlying atrophy with chronic ischemic changes as above. Electronically Signed   By: Jeannine Boga M.D.   On: 07/29/2021 20:04   DG Chest Port 1 View  Result Date: 07/29/2021 CLINICAL DATA:  Weakness EXAM: PORTABLE CHEST 1 VIEW COMPARISON:  Chest x-ray dated Jul 22, 2021 FINDINGS: Cardiac and mediastinal contours are unchanged within normal limits status post sternotomy and CABG. Unchanged consolidation of the right upper lobe. No new parenchymal opacity. No large pleural effusion or pneumothorax. IMPRESSION: Unchanged right upper lobe consolidation. No new parenchymal opacity. Recommend radiographic follow-up to ensure resolution. Electronically Signed   By: Yetta Glassman M.D.   On: 07/29/2021 16:30    EKG: personally reviewed my interpretation is sinus rhythm with new T wave inversions in II, III,  aVF, and T wave inversions V1-V2 (seen on previous ekg). Prolonged QT  ASSESSMENT & PLAN:  Assessment: Principal Problem:   Acute CVA (cerebrovascular accident) ([facility])   Xavier Giles is a 81 y.o. 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  Plan:  #Recurrent multifocal CVA Patient initially presented with suicidal ideations and was found to have worsening of his stroke as seen on MRI brain today. New CVA predominantly in left ICA distribution. There is a concern for ongoing embolic source vs hypercoagulable state in the setting of possible malignancy. Per family, the patient appeared to be weaker today and has also had more difficulty with speech. He has been adherent with his eliquis 5 mg bid, however, has not taken crestor or any antihypertensives, as family members state that the patient was told to stop these medications.  - Neuro consulted, appreciate recs - CTA head/neck pending - CT chest/abd/pelvis to assess for malignancy - Heparin initiated for anticoagulation - Resumed home crestor 20 mg daily  - Frequent neuro checks - Telemetry monitoring - BP goal normotensive - PT/OT/SLP eval and treat  #Depression #Suicidal ideations Prior to the patient's hospitalization he was very active and lived on his own. The patient had multiple strokes during his previous admission and was also found to have DVT, PE, and CAP. Since his discharge, the patient's family states that he has been very depressed and has expressed wanting to end his life. He has asked for his gun and has been refusing to eat, with intent to die. The patient was also found with scissors in his pants and intended to hurt himself.  - Consider psych consult in the AM  #RLL pulmonary embolism #DVT of LLE During last admission,  patient was diagnosed with DVT and PE, both of which were thought to be secondary to prolonged immobilization after recent infection (pneumonia), although possibility of malignancy was also considered. Recent imaging at that time was reassuring for no signs of malignancy. Patient was discharged with eliquis 5 mg bid and has been adherent. - Heparin as above  #Hypertension Goal BP normotensive- systolic  BP has been in the 120-130s thus far. During his last hospitalization, atenolol was resumed at discharge and amlodipine was held. Patient has not been taking either of these medications.  - Resumed home atenolol 50 mg daily - BP goal normotensive  #Elevated transaminases AST/ALT were elevated during the patient's last admission and this was thought to be secondary to sepsis. RUQ [place] was obtained at that time which showed no evidence of liver parenchymal pathology, but did show cholelithiasis without cholecystitis or choledocholithiasis. AST/ALT continue to downtrend. - Daily CMP  #CAD s/p CABG and PCIs Patient has been taking eliquis 5 mg bid since his brillinta was discontinued during his last hospitalization. - Heparin as above  #Diabetes A1c 7.7. Patient does not take any medications for this currently.   Best Practice: Diet: Cardiac diet IVF: Fluids: none VTE: heparin Code: Full AB: none Status: Inpatient with expected length of stay greater than 2 midnights. Anticipated Discharge Location: Home Barriers to Discharge: Medical stability  Signature: Buddy Duty, DO Internal Medicine Resident, PGY-1 [facility]  Pager: [phone] 11:03 PM, 07/29/2021   Please contact the on call pager after 5 pm and on weekends at [phone].

## 2021-07-29 NOTE — ED Triage Notes (Signed)
Pt bib [facility] from home where he lives with family. Family reports pt stating that he was going to starve himself or shoot himself because he "just didn't want to live anymore" since coming home from the hospital 3 days ago. Pt has history of multiple strokes and pna. Pt is very hard of hearing and has poor eye sight. Due to previous strokes, pt unable to move right side and is alert to self. Pt arrives denying pain at this time EMS vitals: 124/74, 126CBG, 85HR, 98%RA

## 2021-07-29 NOTE — Anesthesia Postprocedure Evaluation (Signed)
Anesthesia Post Note  Patient: Xavier Giles  Procedure(s) Performed: TRANSESOPHAGEAL ECHOCARDIOGRAM (TEE) BUBBLE STUDY     Patient location during evaluation: Endoscopy Anesthesia Type: MAC Level of consciousness: awake and alert Pain management: pain level controlled Vital Signs Assessment: post-procedure vital signs reviewed and stable Respiratory status: spontaneous breathing, nonlabored ventilation, respiratory function stable and patient connected to nasal cannula oxygen Cardiovascular status: stable and blood pressure returned to baseline Postop Assessment: no apparent nausea or vomiting Anesthetic complications: no   No notable events documented.  Last Vitals:  Vitals:   07/26/21 1224 07/26/21 1635  BP: 109/84 99/65  Pulse: 95 76  Resp: 18 18  Temp: 36.7 C 36.8 C  SpO2: 97% 97%    Last Pain:  Vitals:   07/26/21 1635  TempSrc: Oral  PainSc:                  [place]

## 2021-07-29 NOTE — ED Provider Notes (Signed)
[place] EMERGENCY DEPARTMENT Provider Note   CSN: [id number] Arrival date & time: 07/29/21  1529     History {Add pertinent medical, surgical, social history, OB history to HPI:1} Chief Complaint  Patient presents with   Psychiatric Evaluation    Xavier Giles is a 81 y.o. male.  He has a prior history of cardiac disease.  Was recently admitted for PE and stroke which left him with limited use of his right arm and leg, decreased vision.  Patient is very hard of hearing also.  He was started on  anticoagulation and he has been home for the last 3 days.  Patient brought in by EMS after patient told family that he was going to start himself so he could die.  The history is provided by the patient and the EMS personnel.  Mental Health Problem Presenting symptoms: depression and suicidal threats   Duration:  3 days Chronicity:  New Context: recent medication change and stressful life event   Associated symptoms: no abdominal pain and no chest pain       Home Medications Prior to Admission medications   Medication Sig Start Date End Date Taking? Authorizing Provider  amoxicillin-clavulanate (AUGMENTIN) 875-125 MG tablet Take 1 tablet by mouth 2 (two) times daily for 2 days. 07/27/21 07/29/21  Rick Duff, MD  apixaban (ELIQUIS) 5 MG TABS tablet Take 1 tablet (5 mg total) by mouth 2 (two) times daily. 07/26/21 10/24/21  Rick Duff, MD  atenolol (TENORMIN) 50 MG tablet Take 1 tablet (50 mg total) by mouth daily. 04/02/21   [name], MD  isosorbide mononitrate (IMDUR) 60 MG 24 hr tablet Take 1 tablet by mouth once daily 01/27/21   [name], MD  rosuvastatin (CRESTOR) 20 MG tablet Take 1 tablet by mouth once daily 10/01/20   [name], MD      Allergies    Keflex [cephalexin], Zocor [simvastatin], Lipitor [atorvastatin], and Sulfa antibiotics    Review of Systems   Review of Systems  Constitutional:  Negative for fever.  HENT:   Positive for hearing loss.   Eyes:  Positive for visual disturbance.  Cardiovascular:  Negative for chest pain.  Gastrointestinal:  Negative for abdominal pain.  Neurological:  Positive for weakness.   Physical Exam Updated Vital Signs BP 121/82 (BP Location: Right Arm)   Pulse 94   Temp 97.9 F (36.6 C) (Oral)   Resp 20   SpO2 100%  Physical Exam Vitals and nursing note reviewed.  Constitutional:      General: He is not in acute distress.    Appearance: Normal appearance. He is well-developed.  HENT:     Head: Normocephalic and atraumatic.  Eyes:     Conjunctiva/sclera: Conjunctivae normal.  Cardiovascular:     Rate and Rhythm: Normal rate and regular rhythm.     Heart sounds: No murmur heard. Pulmonary:     Effort: Pulmonary effort is normal. No respiratory distress.     Breath sounds: Normal breath sounds.  Abdominal:     Palpations: Abdomen is soft.     Tenderness: There is no abdominal tenderness. There is no guarding or rebound.  Musculoskeletal:        General: No deformity. Normal range of motion.     Cervical back: Neck supple.     Right lower leg: No edema.     Left lower leg: No edema.  Skin:    General: Skin is warm and dry.  Capillary Refill: Capillary refill takes less than 2 seconds.  Neurological:     Mental Status: He is alert.     Motor: Weakness present.     Comments: Patient has limited use of right arm right leg.  He is very hard of hearing.  Psychiatric:        Mood and Affect: Mood normal.    ED Results / Procedures / Treatments   Labs (all labs ordered are listed, but only abnormal results are displayed) Labs Reviewed - No data to display  EKG None  Radiology No results found.  Procedures Procedures  {Document cardiac monitor, telemetry assessment procedure when appropriate:1}  Medications Ordered in ED Medications - No data to display  ED Course/ Medical Decision Making/ A&P Clinical Course as of 07/29/21 2118  Tue Jul 29, 2021  1635 Patient has unchanged right upper lobe consolidation.  Awaiting radiology reading. [MB]  [place] Patient's daughter and son-in-law here now.  They said since his neurologic event his eyesight and hearing are worse.  He was okay for the first day or 2 at home but since yesterday has been very depressed and would like to die.  They found a pair of scissors in his pajama pockets.  He has been refusing to eat and has not taken his antibiotics that he was discharged with.  He is taking his Eliquis. [MB]  2118 Dr. Curly Shores has reviewed his imaging and feels he has had progressive stroke through his Eliquis.  She is recommending medicine admission.  Discussed with internal medicine teaching service who will evaluate the patient for admission. [MB]    Clinical Course User Index [MB] Hayden Rasmussen, MD                           Medical Decision Making Amount and/or Complexity of Data Reviewed Labs: ordered. Radiology: ordered.  This patient complains of ***; this involves an extensive number of treatment Options and is a complaint that carries with it a high risk of complications and morbidity. The differential includes ***  I ordered, reviewed and interpreted labs, which included *** I ordered medication *** and reviewed PMP when indicated. I ordered imaging studies which included *** and I independently    visualized and interpreted imaging which showed *** Additional history obtained from *** Previous records obtained and reviewed *** I consulted *** and discussed lab and imaging findings and discussed disposition.  Cardiac monitoring reviewed, *** Social determinants considered, *** Critical Interventions: ***  After the interventions stated above, I reevaluated the patient and found *** Admission and further testing considered, ***    {Document critical care time when appropriate:1} {Document review of labs and clinical decision tools ie heart score, Chads2Vasc2 etc:1}   {Document your independent review of radiology images, and any outside records:1} {Document your discussion with family members, caretakers, and with consultants:1} {Document social determinants of health affecting pt's care:1} {Document your decision making why or why not admission, treatments were needed:1} Final Clinical Impression(s) / ED Diagnoses Final diagnoses:  None    Rx / DC Orders ED Discharge Orders     None

## 2021-07-29 NOTE — Telephone Encounter (Addendum)
Received a call from daughter [name] in regards to Xavier Giles. Pt was discharged on Saturday from our service. She states that, since Sunday, the patient has become increasingly confused and is wanting to kill himself. States that he was trying to get to his gun; luckily family was able to lock it up. They were unsure what to do so called our clinic. Advised to return to the ED immediately.

## 2021-07-29 NOTE — ED Notes (Signed)
Pt back from imaging.  He is very hard of hearing and very limited sight (result from stroke).  He indicated that he does not want anything "I don't want to eat.... nothing for pain."  Family bedside.

## 2021-07-29 NOTE — ED Notes (Signed)
To CT

## 2021-07-30 ENCOUNTER — Encounter (HOSPITAL_COMMUNITY): Payer: Self-pay | Admitting: Infectious Diseases

## 2021-07-30 DIAGNOSIS — Z515 Encounter for palliative care: Secondary | ICD-10-CM

## 2021-07-30 DIAGNOSIS — Z7189 Other specified counseling: Secondary | ICD-10-CM

## 2021-07-30 DIAGNOSIS — R45851 Suicidal ideations: Secondary | ICD-10-CM

## 2021-07-30 DIAGNOSIS — I639 Cerebral infarction, unspecified: Secondary | ICD-10-CM | POA: Diagnosis not present

## 2021-07-30 LAB — LIPID PANEL
Cholesterol: 83 mg/dL (ref 0–200)
HDL: 20 mg/dL — ABNORMAL LOW (ref >40)
LDL Cholesterol: 43 mg/dL (ref 0–99)
Total CHOL/HDL Ratio: 4.2 RATIO
Triglycerides: 101 mg/dL (ref <150)
VLDL: 20 mg/dL (ref 0–40)

## 2021-07-30 LAB — COMPREHENSIVE METABOLIC PANEL
ALT: 69 U/L — ABNORMAL HIGH (ref 0–44)
AST: 37 U/L (ref 15–41)
Albumin: 2.2 g/dL — ABNORMAL LOW (ref 3.5–5.0)
Alkaline Phosphatase: 90 U/L (ref 38–126)
Anion gap: 9 (ref 5–15)
BUN: 16 mg/dL (ref 8–23)
CO2: 22 mmol/L (ref 22–32)
Calcium: 8.1 mg/dL — ABNORMAL LOW (ref 8.9–10.3)
Chloride: 107 mmol/L (ref 98–111)
Creatinine, Ser: 0.89 mg/dL (ref 0.61–1.24)
GFR, Estimated: >60 mL/min (ref >60)
Glucose, Bld: 105 mg/dL — ABNORMAL HIGH (ref 70–99)
Potassium: 3.9 mmol/L (ref 3.5–5.1)
Sodium: 138 mmol/L (ref 135–145)
Total Bilirubin: 0.9 mg/dL (ref 0.3–1.2)
Total Protein: 5.1 g/dL — ABNORMAL LOW (ref 6.5–8.1)

## 2021-07-30 LAB — CBC
HCT: 41.7 % (ref 39.0–52.0)
Hemoglobin: 13.6 g/dL (ref 13.0–17.0)
MCH: 27.6 pg (ref 26.0–34.0)
MCHC: 32.6 g/dL (ref 30.0–36.0)
MCV: 84.8 fL (ref 80.0–100.0)
Platelets: 304 10*3/uL (ref 150–400)
RBC: 4.92 MIL/uL (ref 4.22–5.81)
RDW: 13.4 % (ref 11.5–15.5)
WBC: 10.1 10*3/uL (ref 4.0–10.5)
nRBC: 0 % (ref 0.0–0.2)

## 2021-07-30 LAB — HEPARIN LEVEL (UNFRACTIONATED): Heparin Unfractionated: 0.8 IU/mL — ABNORMAL HIGH (ref 0.30–0.70)

## 2021-07-30 LAB — APTT: aPTT: 28 seconds (ref 24–36)

## 2021-07-30 MED ORDER — BIOTENE DRY MOUTH MT LIQD: 15.0000 mL | OROMUCOSAL | Status: DC | PRN

## 2021-07-30 MED ORDER — GLYCOPYRROLATE 1 MG PO TABS: 1.0000 mg | ORAL_TABLET | ORAL | Status: DC | PRN

## 2021-07-30 MED ORDER — LORAZEPAM 2 MG/ML IJ SOLN
1.0000 mg | INTRAMUSCULAR | Status: DC | PRN
  Administered 2021-07-30: 1 mg via INTRAVENOUS
  Filled 2021-07-30: qty 1

## 2021-07-30 MED ORDER — ONDANSETRON HCL 4 MG/2ML IJ SOLN: 4.0000 mg | Freq: Four times a day (QID) | INTRAMUSCULAR | Status: DC | PRN

## 2021-07-30 MED ORDER — HALOPERIDOL LACTATE 2 MG/ML PO CONC: 0.5000 mg | ORAL | Status: DC | PRN

## 2021-07-30 MED ORDER — AMOXICILLIN-POT CLAVULANATE 875-125 MG PO TABS: 1.0000 | ORAL_TABLET | ORAL | Status: DC

## 2021-07-30 MED ORDER — HALOPERIDOL 0.5 MG PO TABS: 0.5000 mg | ORAL_TABLET | ORAL | Status: DC | PRN

## 2021-07-30 MED ORDER — POLYVINYL ALCOHOL 1.4 % OP SOLN: 1.0000 [drp] | Freq: Four times a day (QID) | OPHTHALMIC | Status: DC | PRN

## 2021-07-30 MED ORDER — APIXABAN 5 MG PO TABS
5.0000 mg | ORAL_TABLET | Freq: Two times a day (BID) | ORAL | Status: DC
  Administered 2021-07-30: 5 mg via ORAL
  Filled 2021-07-30: qty 1

## 2021-07-30 MED ORDER — LORAZEPAM 2 MG/ML PO CONC: 1.0000 mg | ORAL | Status: DC | PRN

## 2021-07-30 MED ORDER — HALOPERIDOL LACTATE 5 MG/ML IJ SOLN: 0.5000 mg | INTRAMUSCULAR | Status: DC | PRN

## 2021-07-30 MED ORDER — MORPHINE SULFATE (CONCENTRATE) 10 MG/0.5ML PO SOLN: 5.0000 mg | ORAL | Status: DC | PRN

## 2021-07-30 MED ORDER — ONDANSETRON 4 MG PO TBDP: 4.0000 mg | ORAL_TABLET | Freq: Four times a day (QID) | ORAL | Status: DC | PRN

## 2021-07-30 MED ORDER — GLYCOPYRROLATE 0.2 MG/ML IJ SOLN: 0.2000 mg | INTRAMUSCULAR | Status: DC | PRN

## 2021-07-30 MED ORDER — LORAZEPAM 1 MG PO TABS
1.0000 mg | ORAL_TABLET | ORAL | Status: DC | PRN
  Filled 2021-07-30: qty 1

## 2021-07-30 MED ORDER — IOHEXOL 350 MG/ML SOLN
80.0000 mL | Freq: Once | INTRAVENOUS | Status: AC | PRN
  Administered 2021-07-30: 80 mL via INTRAVENOUS

## 2021-07-30 MED ORDER — ACETAMINOPHEN 325 MG PO TABS: 650.0000 mg | ORAL_TABLET | Freq: Four times a day (QID) | ORAL | Status: DC | PRN

## 2021-07-30 MED ORDER — SERTRALINE HCL 50 MG PO TABS
25.0000 mg | ORAL_TABLET | Freq: Every day | ORAL | Status: DC
  Administered 2021-07-30: 25 mg via ORAL
  Filled 2021-07-30: qty 1

## 2021-07-30 MED ORDER — ACETAMINOPHEN 650 MG RE SUPP: 650.0000 mg | Freq: Four times a day (QID) | RECTAL | Status: DC | PRN

## 2021-07-30 NOTE — Evaluation (Signed)
Physical Therapy Evaluation Patient Details Name: Xavier Giles MRN: [id number] DOB: [birth year] Today's Date: 07/30/2021  History of Present Illness  Xavier Giles is a 81 y.o. male presents to [facility] with suicidal ideations and decline in function. Imaging revealed multipe new infarcts including, acute ischemia in R cerebral hemispheres, R caudate and R parieto-occipital regions, and expansion of acute ischemic changed in post cerebral hemisphesr and parieto-occipital region and PNA in bilat lobes of lungs. 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   Clinical Impression  Pt admitted with above. Pt extremely HOH, dysarthria, and impaired vision limiting accurate cognitive assessment however pt overall able to follow majority of simple commands. Pt with noted significant R sided weakness and now requiring maxA for transfers and is unable to ambulate. Spoke with patients daughter who states the family can not provide maximum level of assist. Recommend ST-SNF at this time however aware pt with multiple new infarcts in addition to bilat lobe PNA, unsure of medical prognosis. Palliative consult may be beneficial to address goals of care as pt with reports suicidal ideations and pt with rapid functional decline over the last 2 weeks. Acute PT to cont to follow.       Recommendations for follow up therapy are one component of a multi-disciplinary discharge planning process, led by the attending physician.  Recommendations may be updated based on patient status, additional functional criteria and insurance authorization.  Follow Up Recommendations Skilled nursing-short term rehab (<3 hours/day)    Assistance Recommended at Discharge Frequent or constant Supervision/Assistance  Patient can return home with the following  A lot of help with walking and/or transfers;Two  people to help with bathing/dressing/bathroom;Assistance with feeding;Assistance with cooking/housework;Direct supervision/assist for medications management;Direct supervision/assist for financial management;Assist for transportation;Help with stairs or ramp for entrance    Equipment Recommendations None recommended by PT  Recommendations for Other Services       Functional Status Assessment Patient has had a recent decline in their functional status and/or demonstrates limited ability to make significant improvements in function in a reasonable and predictable amount of time     Precautions / Restrictions Precautions Precautions: Fall Restrictions Weight Bearing Restrictions: No      Mobility  Bed Mobility Overal bed mobility: Needs Assistance Bed Mobility: Supine to Sit     Supine to sit: Max assist     General bed mobility comments: max tactile and verbal cues due to [facility], pt did initiate LE movement off EOB but required maxA for trunk elevation and then scooting to EOB    Transfers Overall transfer level: Needs assistance Equipment used: 1 person hand held assist (2nd person to manage lines) Transfers: Sit to/from Stand, Bed to chair/wheelchair/BSC Sit to Stand: Max assist, +2 safety/equipment   Step pivot transfers: Max assist, +2 safety/equipment       General transfer comment: maxA to power up, R knee blocked, once up pt unable to use R UE functionally to support self, pt holding on PT, maxA to advance R LE, R knee buckling in stance phase    Ambulation/Gait               General Gait Details: unable  Stairs            Wheelchair Mobility    Modified Rankin (Stroke Patients Only) Modified Rankin (Stroke Patients Only) Pre-Morbid Rankin Score: Slight disability Modified  Rankin: Severe disability     Balance Overall balance assessment: Needs assistance Sitting-balance support: Single extremity supported, Feet supported Sitting balance-Leahy  Scale: Fair Sitting balance - Comments: posterior bias   Standing balance support: Single extremity supported, During functional activity Standing balance-Leahy Scale: Zero Standing balance comment: dependent on external assist                             Pertinent Vitals/Pain Pain Assessment Pain Assessment: No/denies pain    Home Living Family/patient expects to be discharged to:: Skilled nursing facility   Available Help at Discharge: Family;Available 24 hours/day Type of Home: House             Additional Comments: pt's son was staying at night with patient and granddaughter was staying during the day however family can not provide 24/7 assist [place] term or provide greater than minimal assist    Prior Function Prior Level of Function : Needs assist             Mobility Comments: pt required assist for transfers and ambulation with RW, recent d/c from hospital on 5/27and was walking with therapy about 73' with RW ADLs Comments: assist required     Hand Dominance   Dominant Hand: Right    Extremity/Trunk Assessment   Upper Extremity Assessment Upper Extremity Assessment: RUE deficits/detail RUE Deficits / Details: grip 2/5, elbow flex 3-/5, minimal shld, neglect RUE Coordination: decreased fine motor;decreased gross motor    Lower Extremity Assessment Lower Extremity Assessment: RLE deficits/detail RLE Deficits / Details: grossly 3-/5, R knee buckles in standing RLE Sensation: decreased light touch RLE Coordination: decreased gross motor    Cervical / Trunk Assessment Cervical / Trunk Assessment: Normal  Communication   Communication: HOH (extremely HOH, impaired vision)  Cognition Arousal/Alertness: Awake/alert Behavior During Therapy: Flat affect Overall Cognitive Status: Difficult to assess                                 General Comments: pt is extremely HOH greatly limiting cognitive assessment however when yelling into  pt's R ear pt did follow simple commands, able to state name, pt also with noted R inattention but also has prior vision deficits as well        General Comments General comments (skin integrity, edema, etc.): pt with noted blood in urine, RN notified, HR 90s, RR 25 and SPO2 >92% on RA    Exercises     Assessment/Plan    PT Assessment Patient needs continued PT services  PT Problem List Decreased strength;Decreased activity tolerance;Decreased balance;Decreased mobility;Decreased coordination;Decreased cognition;Decreased knowledge of use of DME;Decreased safety awareness       PT Treatment Interventions Gait training;Functional mobility training;Therapeutic activities;Therapeutic exercise;Balance training;Neuromuscular re-education    PT Goals (Current goals can be found in the Care Plan section)  Acute Rehab PT Goals PT Goal Formulation: With patient/family Time For Goal Achievement: 08/13/21 Potential to Achieve Goals: Fair    Frequency Min 3X/week     Co-evaluation               AM-PAC PT "6 Clicks" Mobility  Outcome Measure Help needed turning from your back to your side while in a flat bed without using bedrails?: A Lot Help needed moving from lying on your back to sitting on the side of a flat bed without using bedrails?: A Lot Help needed moving to  and from a bed to a chair (including a wheelchair)?: A Lot Help needed standing up from a chair using your arms (e.g., wheelchair or bedside chair)?: A Lot Help needed to walk in hospital room?: Total Help needed climbing 3-5 steps with a railing? : Total 6 Click Score: 10    End of Session Equipment Utilized During Treatment: Gait belt Activity Tolerance: Patient tolerated treatment well Patient left: in chair;with call bell/phone within reach;with chair alarm set Nurse Communication: Mobility status (blood in urine) PT Visit Diagnosis: Muscle weakness (generalized) (M62.81);Difficulty in walking, not elsewhere  classified (R26.2)    Time: 0932-3557 PT Time Calculation (min) (ACUTE ONLY): 34 min   Charges:   PT Evaluation $PT Eval Moderate Complexity: 1 Mod PT Treatments $Therapeutic Activity: 8-22 mins        Kittie Plater, PT, DPT Acute Rehabilitation Services Secure chat preferred Office #: [phone]   Berline Lopes 07/30/2021, 10:06 AM

## 2021-07-30 NOTE — Evaluation (Signed)
Speech Language Pathology Evaluation Patient Details Name: JARRIS KORTZ MRN: [id number] DOB: 1940/08/07 Today's Date: 07/30/2021 Time: 0810-0857 SLP Time Calculation (min) (ACUTE ONLY): 47 min  Problem List:  Patient Active Problem List   Diagnosis Date Noted   Acute CVA (cerebrovascular accident) ([place]) 07/29/2021   Cerebral embolism with cerebral infarction 07/23/2021   Elevated troponin    Pulmonary embolism ([place])    Acute encephalopathy 07/22/2021   HAP (hospital-acquired pneumonia) 07/16/2021   New onset right bundle branch block (RBBB) 05/27/2021   Stable angina ([place]) 07/07/2016   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   Encounter for [place]-term (current) use of other medications 10/28/2012   Coronary artery disease involving coronary bypass graft of native heart with angina pectoris (HCC)    S/P CABG x 3    Presence of drug coated stent in SVG-OM    Dyslipidemia, goal LDL below 70    History of: NSTEMI (non-ST elevated myocardial infarction) 01/13/2012    Class: History of   CAD, multiple vessel;  CABG in 1998; CTO SVG-RCA & SVG-Diag, multiple PCI SVG-OM, patent LIMA-LAD with dLAD 80% 01/13/2012   Essential hypertension 01/13/2012   Past Medical History:  Past Medical History:  Diagnosis Date   Asthma    CAD (coronary artery disease) of bypass graft 2003   (now known CTO of SVG-rPDA & SVG-D1 & 3 stents in SeqSVg-Om1-OM2 (1 in prox leg, overlapping DES-BMS from OM1-OM2 into OM2 - s/p PTCA for ISR).; LIMA-LAD patent    CAD S/P PTCA-PCI of SeqSVG-OM1-OM2 2003; 01/2012; 05/2013   a) '03 SeqSVG-OM1-OM2,  Zeta BMS 3.0x8 Seq limb into OM2), & PTCA of SVg-D1 anastomosis; b) 2013: Distal Limb of SeqSVG-OM1-OM2 from OM1-2 overlap BMS ->Xience DES 2.75 x 15 - 30m;; c) 05/2013 Proxlimb SVG--OM1-OM2 -> Resolute DES 3 x 26 - 3.227m Angiosculpt PTCA of ISR in Seq Limb overlaping stents (3.25 mm)   CAD, multiple vessel - Native CAD. 1998; 2015   a) Referred for CABGx5 ; by Cath 05/2013: dLM  60%, Ost LAD 70&. Prox -mid LAD extensive 70%( to-fro flow); D1 80%; OM1 & OM2 (CTO), follow-on LCx beyond AVG 90+% with small OM3/OM4; RCA TO (R-R collaterals);   Carotid bruit present 01/2012   a. 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 Also noted CTO of SVG-Diag; b) CRESCENDO ANGINA: 05/2013 - prox limb SVG-OM1-OM2 DES PCI & PTCA of 95% ISR in distal Limb overlapping DES-BMS; Myoview 08/25/13 - small non-T-mural Inf scar, No Ischemia   S/P CABG x 5 1998   LIMA-LAD, SeqSVG-OM1-OM2; SVG-rPDA, SVG-D1    Past Surgical History:  Past Surgical History:  Procedure Laterality Date   BUBBLE STUDY  07/25/2021   Procedure: BUBBLE STUDY;  Surgeon: RaSkeet LatchMD;  [facility] [facility] Service: Cardiovascular;;   CATARACT EXTRACTION Bilateral    Dr. EpDolores Lory CATARACT EXTRACTION W/ INTRAOCULAR LENS  IMPLANT, BILATERAL Bilateral    CORONARY ANGIOPLASTY WITH STENT PLACEMENT  2003   DR LITTLE: BMS 3.0 mm x 8 mm  stent to Seq limb anastomosis of SeqSVG-OM1-OM2 into OM2; & cutting balloon SVG-Diag   CORONARY ANGIOPLASTY WITH STENT PLACEMENT  06/28/2013   Procedure: CORONARY ANGIOPLASTY WITH STENT PLACEMENT ;  Surgeon: Troy Sine, MD;  Location: [facility] CATH LAB;  Service: Cardiovascular: PCI to SVG-CxOM1-OM2 99% prox lesion (Resolute DES 3.0 x 26 - 3.4 mm) & PTCA of  95% ISR in sequential limb (3.0 mm Angiosculpt)    CORONARY ARTERY BYPASS GRAFT  1998   SVG-diagonal, DVG-OM, DVG-PDA, LIMA-LAD   CORONARY STENT INTERVENTION  01/13/2012      Procedure: CORONARY STENT INTERVENTION ;  Surgeon: Lorretta Harp, MD;  Location: [facility] CATH LAB;  Service: Cardiovascular: PCI Seq limb of Seq SVG-OM1-OM2 90% -> 0% - Xience Xpedition DES 2.75 MM x15 MM (3 MM) overlaps prox edge of BMS into OM2   ESOPHAGOGASTRODUODENOSCOPY N/A 06/29/2013   Procedure: ESOPHAGOGASTRODUODENOSCOPY (EGD);  Surgeon: Beryle Beams, MD;  Location: [facility] ENDOSCOPY;  Service: Endoscopy;  Laterality: N/A;   EYE SURGERY Bilateral    Cat Sx - Dr. Dolores Lory   LEFT HEART CATH AND CORS/GRAFTS ANGIOGRAPHY  2003   ABN CARDIOLITE:  100% SVG-PDA, patent LIMA; 80% anastomotic lesion SVG-Diag; 99% anastomotic lesion of Seq limb SVG-OM1-OM2; Native LM OK - 50% & 80% prox LAD, 80% D1; Native OM1 & OM2 TO; Native RCA CTO (R_R collaterals). => PCI of SVG-OM2 & PTCA of SVG-Diag   LEFT HEART CATHETERIZATION WITH CORONARY/GRAFT ANGIOGRAM N/A 01/13/2012   Procedure: LEFT HEART CATHETERIZATION WITH Beatrix Fetters;  Surgeon: Lorretta Harp, MD;  Location: [facility] CATH LAB;  Service: Cardiovascular: SVG-D1 CTO (new), knosn SVG-RCA CTO.  Native mRCA CTO (R-R collaterals); 50% distal LM, 70-80% prox LAD and 90% mid.  LIMA-LAD patent w/  70% post anastomosis. OM1 &OM2 CTO. Seq limb of SeqSVG-OM1-OM2 90% @ OM1 to BMS into OM2.   LEFT HEART CATHETERIZATION WITH CORONARY/GRAFT ANGIOGRAM N/A 06/28/2013   Procedure: LEFT HEART CATHETERIZATION WITH Beatrix Fetters;  Surgeon: Troy Sine, MD;  Location: [facility] CATH LAB;  Service: Cardiovascular: Cres Angina: a) 60% dLM; 70% Ostial LAD; 90% Cx - no OMs seen; 100% RCA;; SVG-D1 & SVG--dRCA occluded; Patent LIMA-LAD with dLAD ~70-80%;; SeqSVG-OM1-OM2: 95% proximal limb, 50% mid & Diffuse ISR in overlapping DES/BMS in Seq Limb OM-OM2   NM MYOVIEW LTD  07/2013   Test looked good!! Evidence of possible old MI / scar, but nothing to suggest ischemia. Not Gated b/c ectopy .   NM MYOVIEW LTD  06/2017   Intermediate risk because of reduced EF but stable.  Stable  fixed inferior defect consistent with either diaphragmatic attenuation or inferior infarct.  Septal hypokinesis/dyskinesis due to LBBB and prior CABG.  EF is 46%.  Stable compared to 2015 following PCI   TEE WITHOUT CARDIOVERSION N/A 07/25/2021   Procedure: TRANSESOPHAGEAL ECHOCARDIOGRAM (TEE);  Surgeon: Skeet Latch, MD;  Location: [facility] ENDOSCOPY;  Service: Cardiovascular;  Laterality: N/A;   TONSILLECTOMY     HPI:  Mr. 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  Now admitted with decreased participation and concern for depresssion as pt was carrying around scissors in his pants and was asking for a gun PTA.  Recently admitted with CVA, weakness, decreased participation.  He is VERY HOH and has severe vision loss, thus evaluation was challenging.  Prior MRI  acute posterior right frontal lobe and posterior insula with smaller areas of diffusion in bil frontal, parietal and occipital lobes and well as cerebellar hemispheres. Chest CT found PE, right upperl obe and right middle lobe infiltrate on prior admit. Pt seen by SLP for swallow evaluation during prior admission.  Speech evaluation ordered during this admit, pt passed a 3 ounce [place] water screen.   Current imaging shows expansionof prior CVAs and acute ischemia noted within the contralateral right cerebral hemisphere as well, involving the right  caudate and right parieto-occipital region.   Assessment / Plan / Recommendation Clinical Impression  SLP administered speech language evaluation to optimal ability given pt's severe hearing loss and vision impairment.  He demonstrates expressive more than receptive language deficits.  His expressive communication is limited to maximum a few words only with phonemic paraphasias causing severe dysfluency.  He was able to  state his name and answer yes/no questions appropriately re: personal needs but also current president and orientation.  Verbal choice of two when SLP *answer known* allowed him to repeat correct answer.  His attention and problem solving was impaired during the session and he did not try to locate call bell that SLP placed tape to push call bell for assist. ? proprioception deficits.  Will follow up for functional cognitive linguistic treatments, compensation strategies within the restrictions of his hearing loss and vision deficits.    SLP Assessment  SLP Recommendation/Assessment: Patient needs continued Speech Lanaguage Pathology Services SLP Visit Diagnosis: Cognitive communication deficit (R41.841);Aphasia (R47.01);Attention and concentration deficit Attention and concentration deficit following: Nontraumatic intracerebral hemorrhage    Recommendations for follow up therapy are one component of a multi-disciplinary discharge planning process, led by the attending physician.  Recommendations may be updated based on patient status, additional functional criteria and insurance authorization.    Follow Up Recommendations  Skilled nursing-short term rehab (<3 hours/day)    Assistance Recommended at Discharge  Frequent or constant Supervision/Assistance  Functional Status Assessment Patient has had a recent decline in their functional status and/or demonstrates limited ability to make significant improvements in function in a reasonable and predictable amount of time  Frequency and Duration min 2x/week  2 weeks      SLP Evaluation Cognition  Overall Cognitive Status: No family/caregiver present to determine baseline cognitive functioning Arousal/Alertness: Awake/alert Year: Other (Comment) (did not state or repeat with choice of w) Month:  (did not verbalize with choice of 2) Attention: Focused;Sustained Sustained Attention: Impaired Sustained Attention Impairment: Functional  basic Memory: Impaired Memory Impairment: Retrieval deficit;Storage deficit (functional use of call bell was not recalled by pt) Awareness: Impaired Problem Solving: Impaired Problem Solving Impairment: Functional basic (did not scan around bed with hands or visually to locate call bell after use demonstrated)       Comprehension  Auditory Comprehension Overall Auditory Comprehension: Impaired Yes/No Questions: Impaired Basic Biographical Questions: 76-100% accurate Basic Immediate Environment Questions: 75-100% accurate Complex Questions: 25-49% accurate Other Conversation Comments: pt followed simple basic directions; his hearing and vision loss impair his ability to participate in his health care Interfering Components: Attention;Hearing;Visual impairments;Other (comment) (suspect motor planning contributing to issues) Visual Recognition/Discrimination Discrimination: Not tested (pt can not see well) Reading Comprehension Reading Status: Not tested (vision deficits)    Expression Expression Primary Mode of Expression: Verbal Verbal Expression Overall Verbal Expression: Impaired Initiation: Impaired Automatic Speech: Counting (counting fluent once initiated, MOY required assist after 4th month -) Repetition: Impaired Level of Impairment: Word level;Phrase level Naming: Impairment Other Naming  Comments: pt correctly stated "cup" but not "straw", did not verbalize "call bell" Verbal Errors: Phonemic paraphasias;Not aware of errors Pragmatics: No impairment (pt very appropriate re: pragmatics, polite and cooperative) Effective Techniques: Sentence completion;Other (Comment) (repetition - but not of fricatives, higher ptich sounds due to his hearing loss) Written Expression Dominant Hand: Right Written Expression: Not tested (right handed)   Oral / Motor  Oral Motor/Sensory Function Overall Oral Motor/Sensory Function: Other (comment) (no focal weakness) Motor  Speech Respiration: Impaired (at times pt becomes dyspneic with po and talking) Level of Impairment: Phrase Articulation: Within functional limitis Intelligibility: Intelligible Motor Planning:  (difficult to assess but appears functional)            Macario Golds 07/30/2021, 9:33 AM Kathleen Lime, MS [place] Office [phone] Pager [phone]

## 2021-07-30 NOTE — Evaluation (Signed)
Occupational Therapy Evaluation Patient Details Name: Xavier Giles MRN: [id number] DOB: 10-25-1940 Today's Date: 07/30/2021   History of Present Illness Xavier Giles is a 81 y.o. male presents to [facility] with suicidal ideations and decline in function. Imaging revealed multipe new infarcts including, acute ischemia in R cerebral hemispheres, R caudate and R parieto-occipital regions, and expansion of acute ischemic changed in post cerebral hemisphesr and parieto-occipital region and PNA in bilat lobes of lungs. 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   Clinical Impression   Pt requiring assistance at baseline for ADLs and functional mobility since recent d/c on 5/27. Pt HOH, presenting with receptive and expressive difficulties limiting pt's ability to follow commands during session and perform full visual/cognitive assessment. Pt incorrectly identifies numbers held up in central visual field. Pt with RUE weakness requring mod-total A for ADLs, able to grasp washcloth and built up handles to bring to face, requiring hand over hand assistance for guidance/accuracy due to impaired coordination. Pt presenting with impairments listed below, will follow acutely. Recommend SNF at d/c.     Recommendations for follow up therapy are one component of a multi-disciplinary discharge planning process, led by the attending physician.  Recommendations may be updated based on patient status, additional functional criteria and insurance authorization.   Follow Up Recommendations  Skilled nursing-short term rehab (<3 hours/day)    Assistance Recommended at Discharge Frequent or constant Supervision/Assistance  Patient can return home with the following Two people to help with walking and/or transfers;Two people to help with bathing/dressing/bathroom;Assistance with  cooking/housework;Assistance with feeding;Direct supervision/assist for medications management;Direct supervision/assist for financial management;Assist for transportation;Help with stairs or ramp for entrance    Functional Status Assessment  Patient has had a recent decline in their functional status and demonstrates the ability to make significant improvements in function in a reasonable and predictable amount of time.  Equipment Recommendations  None recommended by OT;Other (comment) (defer to next venue of care)    Recommendations for Other Services PT consult     Precautions / Restrictions Precautions Precautions: Fall Restrictions Weight Bearing Restrictions: No      Mobility Bed Mobility               General bed mobility comments: pt sitting up in chair upon arrival    Transfers                   General transfer comment: pt wanting to remain in chair      Balance Overall balance assessment: Needs assistance Sitting-balance support: Single extremity supported, Feet supported Sitting balance-Leahy Scale: Fair Sitting balance - Comments: can sit unsupported up in chair for brief periods of time                                   ADL either performed or assessed with clinical judgement   ADL Overall ADL's : Needs assistance/impaired Eating/Feeding: Moderate assistance;Cueing for safety;Cueing for sequencing;With adaptive utensils   Grooming: Maximal assistance;Sitting;With adaptive equipment Grooming Details (indicate cue type and reason): simulated with built up handle on toothbrush Upper Body Bathing: Moderate assistance;Sitting   Lower Body Bathing: Sitting/lateral leans;Total assistance   Upper Body Dressing : Maximal assistance;Sitting   Lower Body Dressing: Sitting/lateral leans;Total assistance   Toilet Transfer: Maximal assistance;+2 for physical assistance;BSC/3in1;Stand-pivot  Toileting- Clothing Manipulation and  Hygiene: Total assistance;Sitting/lateral lean Toileting - Clothing Manipulation Details (indicate cue type and reason): catheter     Functional mobility during ADLs: Maximal assistance;+2 for physical assistance       Vision   Vision Assessment?: Vision impaired- to be further tested in functional context Additional Comments: difficutly performing visual assessment due to [facility], pt unable to recognize numbers held up in central visual field, undershoots/overshooots when reaching for objects and bringing them to a target     Perception Perception Comments: R inattention; will further assess   Praxis      Pertinent Vitals/Pain Pain Assessment Pain Assessment: No/denies pain     Hand Dominance Right   Extremity/Trunk Assessment Upper Extremity Assessment Upper Extremity Assessment: RUE deficits/detail RUE Deficits / Details: can actively flex R shoulder ~75*,decreased strength 3-/5, grip 2/5, decreased control and coordiantion of RUE during ADL tasks requring Hand over hand guidance initially RUE Coordination: decreased fine motor;decreased gross motor   Lower Extremity Assessment Lower Extremity Assessment: Defer to PT evaluation RLE Deficits / Details: grossly 3-/5, R knee buckles in standing RLE Sensation: decreased light touch RLE Coordination: decreased gross motor   Cervical / Trunk Assessment Cervical / Trunk Assessment: Normal   Communication Communication Communication: Receptive difficulties;Expressive difficulties;HOH   Cognition Arousal/Alertness: Awake/alert Behavior During Therapy: Flat affect Overall Cognitive Status: Difficult to assess                                 General Comments: pt with HOH limiting cognitive assessment, follows single step commands inconsistently during session.     General Comments  VSS on RA during session    Exercises Exercises: General Upper Extremity General Exercises - Upper Extremity Shoulder Flexion:  Right, 5 reps, Seated Elbow Flexion: AAROM, PROM, AROM, 5 reps, Seated, Right Elbow Extension: PROM, AROM, AAROM, Right, 5 reps, Seated   Shoulder Instructions      Home Living Family/patient expects to be discharged to:: Skilled nursing facility Living Arrangements: Alone Available Help at Discharge: Family;Available 24 hours/day Type of Home: House Home Access: Stairs to enter [employer] of Steps: 2   Home Layout: One level     Bathroom Shower/Tub: Teacher, early years/pre: Handicapped height     Home Equipment: None   Additional Comments: pt's son was staying at night with patient and granddaughter was staying during the day however family can not provide 24/7 assist [place] term or provide greater than minimal assist; home set up obtained from chart review from recent previous admission      Prior Functioning/Environment Prior Level of Function : Needs assist             Mobility Comments: pt required assist for transfers and ambulation with RW, recent d/c from hospital on 5/27and was walking with therapy about 24' with RW ADLs Comments: assist required        OT Problem List: Decreased strength;Decreased activity tolerance;Decreased safety awareness;Decreased knowledge of use of DME or AE;Decreased knowledge of precautions      OT Treatment/Interventions: Self-care/ADL training;Therapeutic exercise;DME and/or AE instruction;Therapeutic activities;Balance training;Patient/family education;Visual/perceptual remediation/compensation    OT Goals(Current goals can be found in the care plan section) Acute Rehab OT Goals Patient Stated Goal: none stated OT Goal Formulation: With patient Time For Goal Achievement: 08/13/21 Potential to Achieve Goals: Fair  OT Frequency: Min 2X/week    Co-evaluation  AM-PAC OT "6 Clicks" Daily Activity     Outcome Measure Help from another person eating meals?: A Lot Help from another person  taking care of personal grooming?: A Lot Help from another person toileting, which includes using toliet, bedpan, or urinal?: Total Help from another person bathing (including washing, rinsing, drying)?: A Lot Help from another person to put on and taking off regular upper body clothing?: A Lot Help from another person to put on and taking off regular lower body clothing?: Total 6 Click Score: 10   End of Session Nurse Communication: Mobility status  Activity Tolerance: Patient tolerated treatment well Patient left: in chair;with call bell/phone within reach;with chair alarm set  OT Visit Diagnosis: Other abnormalities of gait and mobility (R26.89);Muscle weakness (generalized) (M62.81);Other symptoms and signs involving the nervous system (R29.898);Other symptoms and signs involving cognitive function;Feeding difficulties (R63.3);Low vision, both eyes (H54.2);Hemiplegia and hemiparesis;Cognitive communication deficit (G50.037)                Time: 0488-8916 OT Time Calculation (min): 21 min Charges:  OT General Charges $OT Visit: 1 Visit OT Evaluation $OT Eval Moderate Complexity: 1 [address] OTD, OTR/L Acute Rehab [phone] - [place] 07/30/2021, 11:29 AM

## 2021-07-30 NOTE — Consult Note (Addendum)
[facility] New Face-to-Face Psychiatric Evaluation   Service Date: Jul 30, 2021 LOS:  LOS: 1 day    Assessment  Xavier Giles is a 81 y.o. male admitted medically for 07/29/2021  3:29 PM for findings of new stroke after presenting to ED for suicidality. 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 Psychiatry was consulted for suicidality by Alberteen Sam, MD.    His current presentation of suicidality in setting of significant and rapid functional decline is most consistent with pt's [place]-standing values and does not independently meet criteria for inpt psychiatric admission.  Exact semiology of symptoms hard to determine (pt with significant difficulty with expressive speech) although daughter denies any psychiatric symptoms prior to stroke; on further discussion with daughter pt has had multiple conversations emphasizing quality of life. Daughter consented (on his behalf) to trial of sertraline for reasons documented below (largely to aid in agitation, emotional outbursts, unlikely to affect pt's desire to die in setting of significant functional decline given pt's underlying values); also recommending palliative care consult.   On initial examination, patient had difficulty engaging due to poor hearing, difficulty speaking but clearly stated his desire to die. Please see plan below for detailed recommendations.   Level 3 caveat: pt with prominent difficulty speaking  Diagnoses:  Active Hospital problems: Principal Problem:   Acute CVA (cerebrovascular accident) [name])     Plan  ## Safety and Observation Level:  - Based on my clinical  evaluation, I estimate the patient to be at fairly high risk of self harm in the current setting, although lacks current access to means - keeping sitter 2/2 ligature risks inherent in medical hospital setting.  - At this time, we recommend a 1:1 or telesitter level of observation. This decision is based on my review of the chart including patient's history and current presentation, interview of the patient, mental status examination, and consideration of suicide risk including evaluating suicidal ideation, plan, intent, suicidal or self-harm behaviors, risk factors, and protective factors. This judgment is based on our ability to directly address suicide risk, implement suicide prevention strategies and develop a safety plan while the patient is in the clinical setting. Please contact our team if there is a concern that risk level has changed.   ## Medications:  -- s sertraline 25 mg (agitation, emotional outbursts, etc)  ## Medical Decision Making Capacity:  Did not formally assess. Functionally pt lacks ability to participate in lengthy risk/benefit discussions.   ## Further Work-up:  -- palliative care consult   -- most recent EKG on 5/30 had QtC of 530 (sertraline does not typically prolong qtc and unclear how significant prolongation is in setting of widened QRS)  -- Pertinent labwork reviewed earlier this admission includes: CBC essentially wnl. U/a no evidence UTI.   ## Disposition:  -- per primary   Thank you for this consult request. Recommendations have been communicated to the primary team.  We will sign off at this time (d/w primary, palliative care).   Xavier Giles   History  Relevant Aspects of Hospital Course:  Admitted on 07/29/2021 for findings of new strokes after presenting to ED for suicidality.  Patient Report:  Patient reports he has difficulty hearing and does not answer  direct questions; unable to formally assess attention/orientation. Seemed to  understand most things said to him. He repeats throughout interview that he wants to die, and that he is "done" with life. States he wants no medications, although did later seem to accept medications that would make him more comfortable. Responded well to being invited to reflect on his accomplishments, values, and relationships with the people he loved.   ROS:  Unable to perform  Collateral Spoke to daughter. Had a stroke last week - had had a stroke while home alone and was found looking for a gun prior to first hospital stay after stroke. Pt always had guns laying by his bed, has a big gun collection, etc. Has been trying to get at the gun cabinet (children have locked up guns) intermittently since discharge, also has been hiding scissors to self-harm. Has been begging daughter to kill him since his stroke. This is all new behavior since his stroke.   Has had many conversations with family through course of life about emphasis on quality of life - ie when he went deaf stated "if I go blind too I am going to kill myself", other similar statements throughout life.  Daughter shares that she understands his desire to die and thinks it is congruent with his Sleep standing values although is clearly bothered by frequency of pt expressing it, begging her to kill him, etc. She shares that prior to the stroke he was fully independent - taught himself how to grocery shop, operate a debit card, etc in his late 64s after his wife/her mother passes away. Had valued independence and been fairly prideful throughout his life. Shares that family has made decision (she has paperwork although did not personally review) to put him in a home and she is worried how he will take it. Had [place] discussion of treatment options available and shared professional recommendation against psychiatric hospitalization due to information above - pt's suicidality is clearly expression of [place]-standing underlying values, and harms of  psychiatric hospitalization (time away from family, increased likelyhood of care over objection/need for restraints, etc) do not outweigh benefits, which are questionable in this case for reasons above.   Did discuss sertraline (best data for post-stroke depression, also convincing data in agitation in elderly) vs mirtazapine (appetite, sleep) - ultimately selected trial of sertraline understanding pt likely to refuse this mediation as increasing appetite in pt determined to starve himself can increase pain/suffering. Daughter reflects that her father in law died of VSED in his 44s after a period of declining health.   Notes her personal pastor is coming to visit today, declines chaplain consult.   Psychiatric History:  Essentially no psychiatric history (dx, meds, inpt, therapy) prior to this visit.    Social History:  Wife died 3 years ago Living alone prior to hospital stay   Daughter denied any significant substance use  Family History:  The patient's family history includes Cancer in his mother; Heart attack in his father.  Medical History: Past Medical History:  Diagnosis Date   Asthma    CAD (coronary artery disease) of bypass graft 2003   (now known CTO of SVG-rPDA & SVG-D1 & 3 stents in SeqSVg-Om1-OM2 (1 in prox leg, overlapping DES-BMS from OM1-OM2 into OM2 - s/p PTCA for ISR).; LIMA-LAD patent    CAD S/P PTCA-PCI of SeqSVG-OM1-OM2 2003; 01/2012; 05/2013   a) '03 SeqSVG-OM1-OM2,  Zeta BMS 3.0x8 Seq limb into OM2), & PTCA of SVg-D1 anastomosis; b) 2013: Distal Limb of SeqSVG-OM1-OM2  from OM1-2 overlap BMS ->Xience DES 2.75 x 15 - 49m;; c) 05/2013 Proxlimb SVG--OM1-OM2 -> Resolute DES 3 x 26 - 3.264m Angiosculpt PTCA of ISR in Seq Limb overlaping stents (3.25 mm)   CAD, multiple vessel - Native CAD. 1998; 2015   a) Referred for CABGx5 ; by Cath 05/2013: dLM 60%, Ost LAD 70&. Prox -mid LAD extensive 70%( to-fro flow); D1 80%; OM1 & OM2 (CTO), follow-on LCx beyond AVG 90+% with small  OM3/OM4; RCA TO (R-R collaterals);   Carotid bruit present 01/2012   a. 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 Also noted CTO of SVG-Diag; b) CRESCENDO ANGINA: 05/2013 - prox limb SVG-OM1-OM2 DES PCI & PTCA of 95% ISR in distal Limb overlapping DES-BMS; Myoview 08/25/13 - small non-T-mural Inf scar, No Ischemia   S/P CABG x 5 1998   LIMA-LAD, SeqSVG-OM1-OM2; SVG-rPDA, SVG-D1     Surgical History: Past Surgical History:  Procedure Laterality Date   BUBBLE STUDY  07/25/2021   Procedure: BUBBLE STUDY;  Surgeon: RaSkeet LatchMD;  [facility] [facility]: [facility];;   CATARACT EXTRACTION Bilateral    Dr. EpDolores Lory CATARACT EXTRACTION W/ INTRAOCULAR LENS  IMPLANT, BILATERAL Bilateral    [place]   [place]: BMS 3.0 mm x 8 mm stent to Seq limb anastomosis of SeqSVG-OM1-OM2 into OM2; & cutting balloon SVG-Diag   CORONARY ANGIOPLASTY WITH STENT PLACEMENT  06/28/2013   Procedure: CORONARY ANGIOPLASTY WITH STENT PLACEMENT ;  Surgeon: ThTroy SineMD;  [facility];  Service: Cardiovascular: PCI to SVG-CxOM1-OM2 99% prox lesion (Resolute DES 3.0 x 26 - 3.4 mm) & PTCA of  95% ISR in sequential limb (3.0 mm Angiosculpt)    CORONARY ARTERY BYPASS GRAFT  1998   SVG-diagonal, DVG-OM, DVG-PDA, LIMA-LAD   CORONARY STENT INTERVENTION  01/13/2012     Procedure: CORONARY STENT INTERVENTION ;  Surgeon: JoLorretta HarpMD;  [facility];  Service: Cardiovascular: PCI Seq limb  of Seq SVG-OM1-OM2 90% -> 0% - Xience Xpedition DES 2.75 MM x15 MM (3 MM) overlaps prox edge of BMS into OM2   ESOPHAGOGASTRODUODENOSCOPY N/A 06/29/2013   Procedure: ESOPHAGOGASTRODUODENOSCOPY (EGD);  Surgeon: PaBeryle BeamsMD;  [facility];  Service: Endoscopy;  Laterality: N/A;   EYE SURGERY Bilateral    Cat Sx - Dr. EpDolores Lory LEFT HEART CATH AND CORS/GRAFTS ANGIOGRAPHY  2003   ABN CARDIOLITE:  100% SVG-PDA, patent LIMA; 80% anastomotic lesion SVG-Diag; 99% anastomotic lesion of Seq limb SVG-OM1-OM2; Native LM OK - 50% & 80% prox LAD, 80% D1; Native OM1 & OM2 TO; Native RCA CTO (R_R collaterals). => PCI of SVG-OM2 & PTCA of SVG-Diag   LEFT HEART CATHETERIZATION WITH CORONARY/GRAFT ANGIOGRAM N/A 01/13/2012   Procedure: LEFT HEART CATHETERIZATION WITH COBeatrix Fetters Surgeon: JoLorretta HarpMD;  [facility];  Service: Cardiovascular: SVG-D1 CTO (new), knosn SVG-RCA CTO.  Native mRCA CTO (R-R collaterals); 50% distal  LM, 70-80% prox LAD and 90% mid.  LIMA-LAD patent w/  70% post anastomosis. OM1 &OM2 CTO. Seq limb of SeqSVG-OM1-OM2 90% @ OM1 to BMS into OM2.   LEFT HEART CATHETERIZATION WITH CORONARY/GRAFT ANGIOGRAM N/A 06/28/2013   Procedure: LEFT HEART CATHETERIZATION WITH Beatrix Fetters;  Surgeon: Troy Sine, MD;  [facility];  Service: Cardiovascular: Cres Angina: a) 60% dLM; 70% Ostial LAD; 90% Cx - no OMs seen; 100% RCA;; SVG-D1 & SVG--dRCA occluded; Patent LIMA-LAD with dLAD ~70-80%;; SeqSVG-OM1-OM2: 95% proximal limb, 50% mid & Diffuse ISR in overlapping DES/BMS in Seq Limb OM-OM2   NM MYOVIEW LTD  07/2013   Test looked good!! Evidence of possible old MI / scar, but nothing to suggest ischemia. Not Gated b/c ectopy .   NM MYOVIEW LTD  06/2017   Intermediate risk because of reduced EF but stable.  Stable fixed inferior defect consistent with either diaphragmatic attenuation or inferior infarct.  Septal hypokinesis/dyskinesis due to LBBB and  prior CABG.  EF is 46%.  Stable compared to 2015 following PCI   TEE WITHOUT CARDIOVERSION N/A 07/25/2021   Procedure: TRANSESOPHAGEAL ECHOCARDIOGRAM (TEE);  Surgeon: Skeet Latch, MD;  [facility];  Service: Cardiovascular;  Laterality: N/A;   TONSILLECTOMY      Medications:   Current Facility-Administered Medications:    acetaminophen (TYLENOL) tablet 650 mg, 650 mg, Oral, Q6H PRN **OR** acetaminophen (TYLENOL) suppository 650 mg, 650 mg, Rectal, Q6H PRN, Atway, Rayann N, DO   apixaban (ELIQUIS) tablet 5 mg, 5 mg, Oral, BID, Orvis Brill, MD   atenolol (TENORMIN) tablet 50 mg, 50 mg, Oral, Daily, Atway, Rayann N, DO, 50 mg at 07/30/21 1021   rosuvastatin (CRESTOR) tablet 20 mg, 20 mg, Oral, Daily, Atway, Rayann N, DO, 20 mg at 07/30/21 1021   senna-docusate (Senokot-S) tablet 1 tablet, 1 tablet, Oral, QHS PRN, Atway, Rayann N, DO  Allergies: Allergies  Allergen Reactions   Keflex [Cephalexin] Other (See Comments)    Unknown ;   Patient states he is not aware of being allergic to cephalexin per [facility] resident MD report.     Zocor [Simvastatin] Other (See Comments)    Unknown    Latex Rash   Lipitor [Atorvastatin] Other (See Comments)    Elevated blood sugar   Sulfa Antibiotics Nausea Only       Objective  Vital signs:  Temp:  [97.9 F (36.6 C)-98.6 F (37 C)] 98.5 F (36.9 C) (05/31 0700) Pulse Rate:  [86-99] 99 (05/31 0700) Resp:  [17-25] 18 (05/31 0700) BP: (121-149)/(54-82) 137/71 (05/31 0700) SpO2:  [94 %-100 %] 94 % (05/31 0700) Weight:  [77.3 kg] 77.3 kg (05/30 2210)  Psychiatric Specialty Exam:  Presentation  Patient in hospital attire, fair eye contact through exam. Appears his stated age. Has difficulty with expressive communication, could not assess orientation, attention,  mood, insight, judgment, thought process, fund of knowledge, abstraction, memory, etc. Has coherent speech although pt struggles with word selection and often gives up  in frustration. Perseverative on desire to die. Not overtly responding to internal stimuli.    Physical Exam: Physical Exam Constitutional:      Appearance: He is normal weight.  HENT:     Head: Normocephalic.  Pulmonary:     Effort: Pulmonary effort is normal.  Neurological:     Mental Status: He is alert.     Comments: Repetitive orofacial movements, sticking tongue out, tapping cheek, etc    Blood pressure 137/71, pulse 99, temperature 98.5 F (  36.9 C), temperature source Oral, resp. rate 18, height '5\' 11"'$  (1.803 m), weight 77.3 kg, SpO2 94 %. Body mass index is 23.77 kg/m.

## 2021-07-30 NOTE — Consult Note (Signed)
Consultation Note Date: 07/30/2021   Patient Name: Xavier Giles  DOB: March 21, 1940  MRN: [id number]  Age / Sex: 81 y.o., male  PCP: Xavier Man, MD Referring Physician: Campbell Riches, MD  Reason for Consultation: Establishing goals of care and Hospice Evaluation  HPI/Patient Profile: 81 y.o. male  with past medical history of CAD status post CABG at age 81 with multiple PCIs afterwards, DVT/PE on Eliquis, HTN/HLD, CVA 1 week Giles with right-sided deficits, admitted on 07/29/2021 with recurrent multifocal CVA.   Clinical Assessment and Goals of Care: I have reviewed medical records including EPIC notes, labs and imaging, received report from RN, assessed the patient.  Xavier Giles is lying quietly in bed.  He appears relatively strong, but acutely ill.  He will make an somewhat keep eye contact.  He is difficult to understand due to dysarthria, and I am not sure that he can make his basic needs known.  His daughter, Xavier Giles, is present at bedside.  We meet at bedside to discuss diagnosis prognosis, GOC, EOL wishes, disposition and options.  I introduced Palliative Medicine as specialized medical care for people living with serious illness. It focuses on providing relief from the symptoms and stress of a serious illness. The goal is to improve quality of life for both the patient and the family.  We discussed a brief life review of the patient.  Xavier Giles worked for 40 years for [employer] retiring at age 68 after heart attack and bypass surgery.  His wife died suddenly 4 years Giles.  He has been living independently since, independent with ADLs.  Daughter manages most of the driving and IADLs.  Since the stroke 1 week Giles he has been unable to walk without a walker or dress himself.  He has experienced severe limitations.  Daughter shares that Xavier Giles has, throughout his life, expressed the importance of quality of  life.  We then focused on their current illness.  Daughter/healthcare agent, Xavier Giles shares that Xavier Giles was relatively independent until his recent stroke.  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  The natural disease trajectory and expectations at EOL were discussed.  The difference between aggressive medical intervention and comfort care was considered in light of the patient's goals of care.  At this point both patient and family are requesting comfort and dignity at end-of-life, "to let nature take its course".  Xavier Giles and her first husband also died at [employer].  Patient and family are agreeable to unburden Xavier Giles from medicines and treatments that are not changing what is happening.  No further IV fluids/antibiotics, no further by mouth medicines that are not focused on comfort.  No further lab draws or needle sticks.  Orders adjusted.  Advanced directives, concepts specific to code status, artifical feeding and hydration, and rehospitalization were considered and discussed.  DNR, orders adjusted.  Hospice Care services  outpatient were explained and offered.  Both Xavier Giles and Xavier Giles are familiar with hospice care at [place].  They are requesting services through [facility], [employer].  [facility] rep notified.  Discussed the importance of continued conversation with family and the medical providers regarding overall plan of care and treatment options, ensuring decisions are within the context of the patient's values and GOCs.  Questions and concerns were addressed.   The family was encouraged to call with questions or concerns.  PMT will continue to support holistically.  Conference with attending, bedside nursing staff, transition of care team related to patient condition, needs, goals of care.   HCPOA HCPOA -Xavier Giles is a widower, his wife  died 4 years Giles.  His daughter, Xavier Giles, is his healthcare surrogate.  He has another child, son Xavier Giles.    SUMMARY OF RECOMMENDATIONS   Requesting comfort and dignity at end-of-life  Residential hospice at [facility] to let nature take its course.   Code Status/Advance Care Planning: DNR  Symptom Management:  End-of-life order set implemented  Palliative Prophylaxis:  Frequent Pain Assessment and Oral Care  Additional Recommendations (Limitations, Scope, Preferences): Full Comfort Care  Psycho-social/Spiritual:  Desire for further Chaplaincy support:no Additional Recommendations: Caregiving  Support/Resources and Education on Hospice  Prognosis:  < 2 weeks would be expected based on acute stroke with recurrence, chronic illness burden, palliative performance scale declined from 60% 1 month Giles to 20% now, albumin of 2.2 with poor by mouth intake by patient with the goal of end-of-life, sips of liquids only.  Discharge Planning:  Requesting comfort and dignity at end-of-life, residential hospice with [facility].  His wife had care at [facility] 4 years Giles and Xavier Giles shares that her first husband also had care at [facility].       Primary Diagnoses: Present on Admission:  Acute CVA (cerebrovascular accident) ([place])   I have reviewed the medical record, interviewed the patient and family, and examined the patient. The following aspects are pertinent.  Past Medical History:  Diagnosis Date   Asthma    CAD (coronary artery disease) of bypass graft 2003   (now known CTO of SVG-rPDA & SVG-D1 & 3 stents in SeqSVg-Om1-OM2 (1 in prox leg, overlapping DES-BMS from OM1-OM2 into OM2 - s/p PTCA for ISR).; LIMA-LAD patent    CAD S/P PTCA-PCI of SeqSVG-OM1-OM2 2003; 01/2012; 05/2013   a) '03 SeqSVG-OM1-OM2,  Zeta BMS 3.0x8 Seq limb into OM2), & PTCA of SVg-D1 anastomosis; b) 2013: Distal Limb of SeqSVG-OM1-OM2 from OM1-2 overlap BMS ->Xience DES 2.75 x 15 - 59m;; c) 05/2013  Proxlimb SVG--OM1-OM2 -> Resolute DES 3 x 26 - 3.241m Angiosculpt PTCA of ISR in Seq Limb overlaping stents (3.25 mm)   CAD, multiple vessel - Native CAD. 1998; 2015   a) Referred for CABGx5 ; by Cath 05/2013: dLM 60%, Ost LAD 70&. Prox -mid LAD extensive 70%( to-fro flow); D1 80%; OM1 & OM2 (CTO), follow-on LCx beyond AVG 90+% with small OM3/OM4; RCA TO (R-R collaterals);   Carotid bruit present 01/2012   a. asymptomatic; carotid doppler 01/2012 - R bulb/prox ICA mild-mod amt of fibrous plaque, L subclavian 70-99% diameter reduction, L bulb/ICA mild amt of fibrous plaque   Chronic low back pain    Dyslipidemia, goal LDL below 70    Hypertension, essential    Hypertensive retinopathy    OU   Macular degeneration    Dry OD, Wet OS   Non-ST elevated myocardial infarction (non-STEMI) / Crescendo Angina  01/2012   Cath noted 90% stenosis from OM1-OM2 in Seq Limb of SVG-OM1-OM2 --> DES PCI overlaps prior BMS. Also noted CTO of SVG-Diag; b) CRESCENDO ANGINA: 05/2013 - prox limb SVG-OM1-OM2 DES PCI & PTCA of 95% ISR in distal Limb overlapping DES-BMS; Myoview 08/25/13 - small non-T-mural Inf scar, No Ischemia   S/P CABG x 5 1998   LIMA-LAD, SeqSVG-OM1-OM2; SVG-rPDA, SVG-D1    Social History   Socioeconomic History   Marital status: Married    Spouse name: Not on file   Number of children: Not on file   Years of education: Not on file   Highest education level: Not on file  Occupational History   Not on file  Tobacco Use   Smoking status: Former    Packs/day: 2.00    Years: 30.00    Pack years: 60.00    Types: Cigarettes    Quit date: 03/02/1990    Years since quitting: 31.4   Smokeless tobacco: Never  Vaping Use   Vaping Use: Never used  Substance and Sexual Activity   Alcohol use: Not Currently    Comment: 06/28/2013 "aien't drank in years; used to drink a little beer"   Drug use: No   Sexual activity: Never  Other Topics Concern   Not on file  Social History Narrative   Retired  Married father of 2, grandfather tube.  Exercises routinely walking a roughly an hour a day.  Does not smoke does not drink.  He quit smoking many years Giles.   Social Determinants of Health   Financial Resource Strain: Not on file  Food Insecurity: Not on file  Transportation Needs: Not on file  Physical Activity: Not on file  Stress: Not on file  Social Connections: Not on file   Family History  Problem Relation Age of Onset   Cancer Mother    Heart attack Father    Scheduled Meds:  apixaban  5 mg Oral BID   atenolol  50 mg Oral Daily   rosuvastatin  20 mg Oral Daily   sertraline  25 mg Oral Daily   Continuous Infusions: PRN Meds:.acetaminophen **OR** acetaminophen, senna-docusate Medications Prior to Admission:  Prior to Admission medications   Medication Sig Start Date End Date Taking? Authorizing Provider  apixaban (ELIQUIS) 5 MG TABS tablet Take 1 tablet (5 mg total) by mouth 2 (two) times daily. 07/26/21 10/24/21 Yes Rick Duff, MD  atenolol (TENORMIN) 50 MG tablet Take 1 tablet (50 mg total) by mouth daily. Patient not taking: Reported on 07/29/2021 04/02/21   Xavier Man, MD  isosorbide mononitrate (IMDUR) 60 MG 24 hr tablet Take 1 tablet by mouth once daily Patient not taking: Reported on 07/29/2021 01/27/21   Xavier Man, MD  rosuvastatin (CRESTOR) 20 MG tablet Take 1 tablet by mouth once daily Patient not taking: Reported on 07/29/2021 10/01/20   Xavier Man, MD   Allergies  Allergen Reactions   Keflex [Cephalexin] Other (See Comments)    Unknown ;   Patient states he is not aware of being allergic to cephalexin per IMTS resident MD report.     Zocor [Simvastatin] Other (See Comments)    Unknown    Latex Rash   Lipitor [Atorvastatin] Other (See Comments)    Elevated blood sugar   Sulfa Antibiotics Nausea Only   Review of Systems  Unable to perform ROS: Age   Physical Exam Vitals and nursing note reviewed.  Constitutional:      General: He is  not in acute distress.    Appearance: He is not ill-appearing.  HENT:     Mouth/Throat:     Mouth: Mucous membranes are moist.  Cardiovascular:     Rate and Rhythm: Normal rate.  Pulmonary:     Effort: Pulmonary effort is normal. No respiratory distress.  Musculoskeletal:        General: No swelling.     Comments: Right hemiaplasia  Skin:    General: Skin is warm and dry.  Neurological:     Mental Status: He is alert.     Comments: Unable to fully answer orientation questions  Psychiatric:     Comments: Mostly calm, disgruntled at times    Vital Signs: BP 132/64   Pulse 71   Temp 98.2 F (36.8 C) (Oral)   Resp (!) 21   Ht '5\' 11"'$  (1.803 m)   Wt 77.3 kg   SpO2 95%   BMI 23.77 kg/m  Pain Scale: 0-10   Pain Score: 0-No pain   SpO2: SpO2: 95 % O2 Device:SpO2: 95 % O2 Flow Rate: .   IO: Intake/output summary:  Intake/Output Summary (Last 24 hours) at 07/30/2021 1454 Last data filed at 07/30/2021 1200 Gross per 24 hour  Intake 680 ml  Output 400 ml  Net 280 ml    LBM: Last BM Date :  (PTA) Baseline Weight: Weight: 77.3 kg Most recent weight: Weight: 77.3 kg     Palliative Assessment/Data:   Flowsheet Rows    Flowsheet Row Most Recent Value  Intake Tab   Referral Department Hospitalist  Unit at Time of Referral Med/Surg Unit  Palliative Care Primary Diagnosis Neurology  Date Notified 07/30/21  Palliative Care Type New Palliative care  Reason for referral Counsel Regarding Hospice, Clarify Goals of Care  Date of Admission 07/29/21  Date first seen by Palliative Care 07/30/21  # of days Palliative referral response time 0 Day(s)  # of days IP prior to Palliative referral 1  Clinical Assessment   Palliative Performance Scale Score 20%  Pain Max last 24 hours Not able to report  Pain Min Last 24 hours Not able to report  Dyspnea Max Last 24 Hours Not able to report  Dyspnea Min Last 24 hours Not able to report  Psychosocial & Spiritual Assessment    Palliative Care Outcomes        Time In: 1400 Time Out: 1515 Time Total: 75 minutes  Greater than 50%  of this time was spent counseling and coordinating care related to the above assessment and plan.  Signed by: Drue Novel, NP   Please contact Palliative Medicine Team phone at [phone] for questions and concerns.  For individual provider: See Shea Evans

## 2021-07-30 NOTE — Hospital Course (Addendum)
#  Recurrent multifocal CVA in the setting of LLE DVT and large PFO #Comfort care Patient was recently hospitalized for multifocal strokes and found to have a DVT and large PFO. Then, he was discharged on eliquis regimen which he endorses taking as prescribed. He presented here several days after his discharge with suicidal ideations. He was HDS and majority of his workup was unremarkable (UDS, ethanol) with the exception of worsening infarcts - MRI showed new CVA predominantly in left ICA distribution. Due to concern for underlying malignancy, Full body imaging was obtained and found to be reassuring for no signs of malignancy.  Neurology was consulted and initially started the patient on heparin low goal no bolus protocol. Also restarted his home crestor. The following day, patient was transitioned back to his home eliquis (unclear if pt has actually had apixaban failure given short amount of time he has been on this medication).   PT, OT, and SLP evaluated the patient and recommended SNF. After psychiatry evaluation (see below for more info), palliative care was consulted. Patient was transitioned to comfort care on 05/31 with plan for discharge to residential hospice at [facility].***   #Depression #Suicidal ideations Prior to the patient's hospitalization he was very active and lived on his own. The patient had multiple strokes prior to his last admission and was also found to have DVT, PE, and CAP. Since his discharge, the patient's family states that he has been very depressed and has expressed wanting to end his life. He has asked for his gun and has been refusing to eat, with intent to die. The patient was also found with scissors in his pants and intended to hurt himself. Psychiatry was consulted and recommended a 1:1 sitter for observation and initiation of sertraline 25 mg. After psychiatry discussion with other family members, made a decision against psychiatric hospitalization as the pt's  suicidality was clearly expression of [place]-standing underlying values, and harms of psychiatric hospitalization (time away from family, increased likelyhood of care over objection/need for restraints, etc) do not outweigh benefits.   #RLL pulmonary embolism #DVT of LLE During last admission, patient was diagnosed with DVT and PE, both of which were thought to be secondary to prolonged immobilization after recent infection (pneumonia), although the possibility of malignancy was also considered. Recent imaging however has been reassuring for no signs of malignancy. Adherent with eliquis at home. Started heparin and transitioned back to home eliquis as mentioned above.   #Hypertension Systolic BP's were normotensive for the majority of this hospitalization*** with goal normotensive after his CVAs. He was continued on his home atenolol regimen throughout this hospitalization.    #Elevated transaminases, improving AST/ALT were elevated during the patient's last admission and this was thought to be secondary to sepsis. RUQ [place] was obtained at that time and showed no evidence of liver parenchymal pathology. AST/ALT continue to downtrend on daily CMPs obtained in the hospital.   #CAD s/p CABG and PCIs Patient has been taking eliquis 5 mg bid since his brillinta was discontinued during his last hospitalization. On heparin for one day and subsequently transitioned back to eliquis.   #Diabetes A1c 7.7. Patient does not take any medications for this currently.

## 2021-07-30 NOTE — Progress Notes (Signed)
[facility] ([facility]) Hospital Liaison Note  Referral received for patient/family interest in [facility]. Chart under review by [facility] physician.   Hospice eligibility pending.   Please call with any questions or concerns. Thank you  Shanita Wicker, LCSW [facility] Hospital Liaison [phone] 

## 2021-07-30 NOTE — Progress Notes (Signed)
Subjective: The patient was assessed at bedside this AM. Repeats multiple times times that he can't hear very well. Also reports he doesn't care but does not specifically say regarding what.   Per daughter, patient was normal just 2 weeks ago. Since last hospitalizaiton reports that the vision is very blurry. 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  Objective:  Vital signs in last 24 hours: Vitals:   07/29/21 2200 07/29/21 2210 07/30/21 0036 07/30/21 0502  BP: 135/67  (!) 147/71 (!) 147/76  Pulse: 88  90 96  Resp: '19  19 19  '$ Temp:  98.4 F (36.9 C) 98 F (36.7 C) 98.6 F (37 C)  TempSrc:  Axillary Oral Axillary  SpO2: 95%   94%  Weight:  77.3 kg    Height:  '5\' 11"'$  (1.803 m)     General: Elderly male sitting comfortably in chair. No acute distress. Very hard of hearing.  Head: Normocephalic. Atraumatic.  CV: RRR. No murmurs, rubs, or gallops. No LE edema  Pulmonary: Lungs CTAB. Normal effort. No wheezing or rales.  Abdominal: Soft, nontender, nondistended. Normal bowel sounds.  Extremities: Normal ROM.  Skin: Warm and dry. Some scattered ecchymosis.  Neuro: Neuro exam is very limited 2/2 to patient's hearing loss and inability to follow commands. Unable to assess orientation at this time.  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 We was discharged on eliquis regimen which he endorses taking as prescribed. He presented here several days after his discharge with suicidal ideations and was found to have worsening of his stroke as noted on brain MRI. New CVA predominantly in left ICA distribution. Recent imaging has been reassuring for no signs of malignancy. Neurology is on board and recently started patient on heparin  low goal no bolus protocol. Discussed with neurology today - unclear if pt has actually had apixaban failure given short amount of time he has been on this medication. Will DC heparin and restart his home eliquis and continue to closely monitor.  - Neuro consulted, appreciate recs - Eliquis 5 mg BID started today - Could consider placement of IVC filter given his DVT and large PFO - Continue home crestor 20 mg daily  - Frequent neuro checks - Telemetry monitoring - BP goal normotensive - OT eval pending, PT and SLP are recommending SNF  #Depression #Suicidal ideations Prior to the patient's hospitalization he was very active and lived on his own. The patient had multiple strokes during his previous admission and was also found to have DVT, PE, and CAP. Since his discharge, the patient's family states that he has been very depressed and has expressed wanting to end his life. He has asked for his gun and has been refusing to eat, with intent to die. The patient was also found with scissors in his pants and intended to hurt himself.  - Consulted psychiatry, appreciate recs   #RLL pulmonary embolism #DVT of LLE During last admission, patient was diagnosed with DVT and PE, both of which were thought to be secondary to prolonged immobilization after recent infection (pneumonia), although the possibility of malignancy was also considered. Recent imaging however has been reassuring for no signs of malignancy. Adherent with eliquis at home. Will DC heparin and restart  eliquis at this time. - Resumed home Eliquis 5 mg BID  #Hypertension Systolic BP's have been in the 120s-140s since admit. Pt is asymptomatic and is without signs of volume overload. - Continue home atenolol 50 mg daily  - Goal BP normotensive   #Elevated transaminases, improving AST/ALT were elevated during the patient's last admission and this was thought to be secondary to sepsis. RUQ [place] was obtained at that time and showed no  evidence of liver parenchymal pathology. AST/ALT continue to downtrend. - Daily CMP   #CAD s/p CABG and PCIs Patient has been taking eliquis 5 mg bid since his brillinta was discontinued during his last hospitalization. - DC heparin today - Resume home eliquis as above  #Diabetes A1c 7.7. Patient does not take any medications for this currently.  -CTM   Prior to Admission Living Arrangement: Home with family Anticipated Discharge Location: Pending Barriers to Discharge: Medical stability, Psychiatry, PT/OT evals Dispo: Anticipated discharge pending medical stability, psychiatry and PT/OT evals  Orvis Brill, MD 07/30/2021, 8:13 AM Pager: [phone]  After 5pm on weekdays and 1pm on weekends: On Call pager [phone]

## 2021-07-30 NOTE — Progress Notes (Addendum)
STROKE TEAM PROGRESS NOTE   INTERVAL HISTORY Patient is seen in his room with his daughter at the bedside.  He has had a decline in his functional status over the last two weeks with on hospital admission on 5/17 for pleuritic chest pain and another on 5/23 for pneumonia.  Patient has had suicidal ideation since discharge and has been asking for his gun and concealing scissors with intent to harm himself.  On Sunday, he developed increased right sided weakness.  MRI reveals expansion of ischemic changes in left cerebral hemisphere with new areas of ischemia in left parieto-occipital region.    Vitals:   07/29/21 2210 07/30/21 0036 07/30/21 0502 07/30/21 0700  BP:  (!) 147/71 (!) 147/76 137/71  Pulse:  90 96 99  Resp:  '19 19 18  '$ Temp: 98.4 F (36.9 C) 98 F (36.7 C) 98.6 F (37 C) 98.5 F (36.9 C)  TempSrc: Axillary Oral Axillary Oral  SpO2:   94% 94%  Weight: 77.3 kg     Height: '5\' 11"'$  (1.803 m)      CBC:  Recent Labs  Lab 07/29/21 1620 07/30/21 0309  WBC 11.3* 10.1  NEUTROABS 8.5*  --   HGB 14.2 13.6  HCT 43.7 41.7  MCV 85.5 84.8  PLT 323 967   Basic Metabolic Panel:  Recent Labs  Lab 07/26/21 0353 07/29/21 1620 07/30/21 0309  NA 133* 137 138  K 3.7 3.7 3.9  CL 102 103 107  CO2 20* 24 22  GLUCOSE 191* 113* 105*  BUN '21 17 16  '$ CREATININE 1.02 1.10 0.89  CALCIUM 7.7* 8.3* 8.1*  MG 2.2  --   --    Lipid Panel:  Recent Labs  Lab 07/30/21 0309  CHOL 83  TRIG 101  HDL 20*  CHOLHDL 4.2  VLDL 20  LDLCALC 43   HgbA1c: No results for input(s): HGBA1C in the last 168 hours. Urine Drug Screen:  Recent Labs  Lab 07/29/21 2200  LABOPIA NONE DETECTED  COCAINSCRNUR NONE DETECTED  LABBENZ NONE DETECTED  AMPHETMU NONE DETECTED  THCU NONE DETECTED  LABBARB NONE DETECTED    Alcohol Level  Recent Labs  Lab 07/29/21 1620  ETH <10    IMAGING past 24 hours CT ANGIO HEAD NECK W WO CM  Result Date: 07/30/2021 CLINICAL DATA:  Follow-up examination for acute  stroke. EXAM: CT ANGIOGRAPHY HEAD AND NECK TECHNIQUE: Multidetector CT imaging of the head and neck was performed using the standard protocol during bolus administration of intravenous contrast. Multiplanar CT image reconstructions and MIPs were obtained to evaluate the vascular anatomy. Carotid stenosis measurements (when applicable) are obtained utilizing NASCET criteria, using the distal internal carotid diameter as the denominator. RADIATION DOSE REDUCTION: This exam was performed according to the departmental dose-optimization program which includes automated exposure control, adjustment of the mA and/or kV according to patient size and/or use of iterative reconstruction technique. CONTRAST:  57m OMNIPAQUE IOHEXOL 350 MG/ML SOLN COMPARISON:  Prior study from earlier the same day as well as previous exam from 07/24/2021. FINDINGS: CT HEAD FINDINGS Brain: Atrophy with chronic microvascular ischemic disease and multiple remote cerebellar infarcts. Evolving acute to early subacute ischemic changes involving the left greater than right cerebral and cerebellar hemispheres, better characterized on prior brain MRI. No associated hemorrhage evident by CT. No significant regional mass effect. No mass lesion or midline shift. No hydrocephalus or extra-axial fluid collection. Vascular: No hyperdense vessel. Calcified atherosclerosis present at skull base. Skull: No new finding. Sinuses: Paranasal sinuses  are clear. Small right mastoid effusion noted. Orbits: Globes orbital soft tissues demonstrate no acute finding. Review of the MIP images confirms the above findings CTA NECK FINDINGS Aortic arch: Visualized aortic arch normal caliber with normal 3 vessel morphology. Moderate atheromatous change about the arch and origin of the great vessels. 50% stenoses involving the origins of the brachiocephalic and left common carotid arteries, with 40% stenosis involving the proximal left subclavian artery, stable. Right carotid  system: Right CCA remains patent to the bifurcation. Estimated 50% stenosis involving the proximal cervical right ICA noted, unchanged. Right ICA remains patent distally without new or progressive finding. Left carotid system: 50% stenosis at the origin of the left CCA. Eccentric calcified plaque about the left carotid bulb/proximal left ICA without hemodynamically significant stenosis. No evidence for dissection or occlusion. Flow within the left ICA is somewhat attenuated as compared to the contralateral right ICA on today's exam. Vertebral arteries: Both vertebral arteries arise from the subclavian arteries. 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 50% stenosis at the origin of the right brachiocephalic artery. Atheromatous change involving the proximal left vertebral artery with associated moderate to severe left V1 stenoses, with more mild to moderate distal left V2 and V3 stenoses. Mild atheromatous narrowing at the origin of the right vertebral artery. Overall, appearance is stable from prior. No evidence for dissection or interval occlusion. Skeleton: No discrete or worrisome osseous lesions. Median sternotomy wires noted. Patient is edentulous. Other neck: No other acute soft tissue abnormality within the neck. Few scattered thyroid nodules measuring up to 1 cm noted, doubtful significance given size and patient age, no follow-up imaging recommended (ref: J Am Coll Radiol. 2015 Feb;12(2): 143-50). Upper chest: Emphysema. Multifocal consolidative opacities involving the visualized right greater than left upper lobes, consistent with pneumonia. Tiny filling defect within a right-sided subsegmental pulmonary artery, consistent with previously identified pulmonary emboli. Review of the MIP images confirms the above findings CTA HEAD FINDINGS Anterior circulation: Petrous segments remain patent. Extensive atheromatous change throughout the carotid siphons with associated moderate to severe multifocal  narrowing, right worse than left, stable. A1 segments patent. Normal anterior communicating artery complex. Anterior cerebral arteries remain patent to their distal aspects without significant stenosis. No M1 stenosis or occlusion. No proximal MCA branch occlusion. Distal MCA branches perfused and symmetric. Distal small vessel atheromatous irregularity noted. Posterior circulation: Both vertebral arteries patent as they course into the cranial vault. Atheromatous change about the proximal right V4 segment with associated moderate stenosis. Atheromatous change about the left V4 segment with associated moderate to severe multifocal stenoses, stable. Both PICA remain patent. Basilar remains patent to its distal aspect without stenosis superior cerebellar arteries remain patent bilaterally. Both PCAs primarily supplied via the basilar. Short-segment severe right P2 stenosis, stable. PCAs otherwise patent to their distal aspects without significant stenosis. Venous sinuses: Grossly patent allowing for timing the contrast bolus. Anatomic variants: None significant.  No aneurysm. Review of the MIP images confirms the above findings IMPRESSION: CT HEAD IMPRESSION: 1. Patchy multifocal evolving acute to early subacute ischemic infarcts involving the left greater than right cerebral and cerebellar hemispheres, better characterized on recent brain MRI. No associated hemorrhage by CT. No significant regional mass effect. 2. Underlying atrophy with chronic microvascular ischemic disease, with multiple remote cerebellar infarcts. CTA HEAD AND NECK IMPRESSION: 1. Stable CTA of the head and neck as compared to 07/24/2021. No large vessel occlusion or other interval emergent finding. 2. Atheromatous disease about the major arterial vasculature of the neck with  associated 50% stenoses at the origins of the brachiocephalic and left common carotid arteries, 40% proximal left subclavian artery stenosis, and 50% stenosis involving the  proximal cervical right ICA. Moderate to severe multifocal left vertebral artery stenoses, most pronounced at the V1 segment. 3. Intracranial atherosclerotic disease with associated moderate to severe stenoses about the right greater than left carotid siphons, left greater than right V4 segments, and right P2 segment. 4. Multifocal consolidative opacities about the right greater than left upper lobes, consistent with pneumonia. 5. Tiny filling defect within a visualized right-sided subsegmental pulmonary artery, consistent with previously identified pulmonary emboli. 6.  Emphysema (ICD10-J43.9). Electronically Signed   By: Jeannine Boga M.D.   On: 07/30/2021 02:36   MR BRAIN WO CONTRAST  Addendum Date: 07/30/2021   ADDENDUM REPORT: 07/30/2021 07:51 ADDENDUM: In addition to the initially described findings, a few additional new subcentimeter foci of acute ischemia noted within the contralateral right cerebral hemisphere as well, involving the right caudate and right parieto-occipital region. Electronically Signed   By: Jeannine Boga M.D.   On: 07/30/2021 07:51   Result Date: 07/30/2021 CLINICAL DATA:  Initial evaluation for mental status change, unknown cause. EXAM: MRI HEAD WITHOUT CONTRAST TECHNIQUE: Multiplanar, multiecho pulse sequences of the brain and surrounding structures were obtained without intravenous contrast. COMPARISON:  Recent MRI from 07/23/2021. FINDINGS: Brain: There has been continued interval evolution of previously identified multifocal ischemic infarcts involving the left greater than right cerebral and cerebellar hemispheres. Since previous exam, there has been interval expansion of acute ischemic changes involving the posterior left cerebral hemisphere, with multiple new areas of ischemia involving the left parieto-occipital region (series 5, images 89, 78). The remainder of the evolving ischemic changes are otherwise relatively similar. Evidence for associated petechial  hemorrhage at the posterior left frontoparietal region (series 14, image 37) Heidelberg classification 1a: HI1, scattered small petechiae, no mass effect. No frank hemorrhagic transformation by MRI. Underlying atrophy with chronic microvascular ischemic disease again noted. Few scattered remote cerebellar infarcts noted as well. No mass lesion, significant mass effect, or midline shift. No hydrocephalus or extra-axial fluid collection. Pituitary gland suprasellar region within normal limits. Midline structures intact and normally formed. Vascular: Circumferential T2/FLAIR signal abnormality now seen about the distal cervical left ICA, new from prior (series 10, image 1), and could reflect changes of a proximal stenosis or possibly dissection. Major intracranial vascular flow voids are otherwise maintained. Skull and upper cervical spine: Craniocervical junction within normal limits. Bone marrow signal intensity normal. No scalp soft tissue abnormality. Sinuses/Orbits: Prior bilateral ocular lens replacement. Paranasal sinuses are clear. Small right greater than left mastoid effusions noted. Other: None. IMPRESSION: 1. Interval expansion of acute ischemic changes involving the posterior left cerebral hemisphere, with multiple new areas of ischemia involving the left parieto-occipital region. Associated petechial hemorrhage without frank hemorrhagic transformation. 2. Otherwise normal expected interval evolution of previously identified multifocal ischemic infarcts involving the left greater than right cerebral and cerebellar hemispheres. 3. Circumferential T2/FLAIR signal abnormality about the distal cervical left ICA, new from prior. Finding could reflect changes of a proximal stenosis or possibly dissection. Further evaluation with dedicated CTA and/or MRA suggested for further evaluation. 4. Underlying atrophy with chronic ischemic changes as above. Electronically Signed: By: Jeannine Boga M.D. On:  07/29/2021 20:04   CT CHEST ABDOMEN PELVIS W CONTRAST  Result Date: 07/30/2021 CLINICAL DATA:  Metastatic disease evaluation. EXAM: CT CHEST, ABDOMEN, AND PELVIS WITH CONTRAST TECHNIQUE: Multidetector CT imaging of the chest, abdomen and pelvis  was performed following the standard protocol during bolus administration of intravenous contrast. RADIATION DOSE REDUCTION: This exam was performed according to the departmental dose-optimization program which includes automated exposure control, adjustment of the mA and/or kV according to patient size and/or use of iterative reconstruction technique. CONTRAST:  23m OMNIPAQUE IOHEXOL 350 MG/ML SOLN COMPARISON:  07/22/2021 FINDINGS: CT CHEST FINDINGS Cardiovascular: Diffuse aortic atherosclerosis. Heart is normal size. Aorta is normal caliber. Prior CABG. Small right lower lobe pulmonary embolus again noted as seen on prior CT. Mediastinum/Nodes: Small scattered mediastinal lymph nodes, none pathologically enlarged. No mediastinal, hilar, or axillary adenopathy. Trachea and esophagus are unremarkable. Thyroid unremarkable. Lungs/Pleura: Moderate centrilobular emphysema. Continued extensive airspace disease throughout the right upper lobe, now also seen in the left upper lobe compatible with pneumonia. No effusions. Musculoskeletal: Chest wall soft tissues are unremarkable. No acute bony abnormality. CT ABDOMEN PELVIS FINDINGS Hepatobiliary: Small gallstones layering within the gallbladder, unchanged. No focal hepatic abnormality. Pancreas: No focal abnormality or ductal dilatation. Spleen: Areas of wedge-shaped low densities throughout the spleen compatible with splenic infarcts. Adrenals/Urinary Tract: Bilateral renal simple cysts. Left nephrolithiasis. No ureteral stones or hydronephrosis. Urinary bladder unremarkable. Stomach/Bowel: Normal appendix. Sigmoid diverticulosis. No active diverticulitis. Stomach and small bowel decompressed, unremarkable.  Vascular/Lymphatic: Heavily calcified aorta and iliac vessels. No evidence of aneurysm or adenopathy. Reproductive: No visible focal abnormality. Other: No free fluid or free air. Musculoskeletal: Degenerative changes in the lumbar spine. No acute bony abnormality. IMPRESSION: Continued extensive consolidation in the right upper lobe. New areas of consolidation in the left upper lobe. Findings compatible with pneumonia. Moderate to severe emphysema. Areas of wedge-shaped low-density throughout the spleen compatible with splenic infarcts. These are better seen on today's study than prior study but at least some of these were present on prior study. Cholelithiasis. Left nephrolithiasis. Sigmoid diverticulosis. Electronically Signed   By: KRolm BaptiseM.D.   On: 07/30/2021 00:17   DG Chest Port 1 View  Result Date: 07/29/2021 CLINICAL DATA:  Weakness EXAM: PORTABLE CHEST 1 VIEW COMPARISON:  Chest x-ray dated Jul 22, 2021 FINDINGS: Cardiac and mediastinal contours are unchanged within normal limits status post sternotomy and CABG. Unchanged consolidation of the right upper lobe. No new parenchymal opacity. No large pleural effusion or pneumothorax. IMPRESSION: Unchanged right upper lobe consolidation. No new parenchymal opacity. Recommend radiographic follow-up to ensure resolution. Electronically Signed   By: LYetta GlassmanM.D.   On: 07/29/2021 16:30    PHYSICAL EXAM General:  Alert, thin-appearing elderly patient in no acute distress Respiratory:  Regular, unlabored respirations on room air Neurological:  Uncooperative with exam, stating he just wants to die.  Difficult to communicate with patient given hearing impairment.  PERRL, does not blink to threat on left.  Speech clear but does not always provide appropriate answers to questions.  Will move LUE and LLE spontaneously, does not move RUE and RLE  ASSESSMENT/PLAN Mr. CFREDY GLADUis a 81y.o. 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  He has also had recent suicidal ideation.  MRI reveals expansion of ischemic changes in left cerebral hemisphere with new areas of ischemia in left parieto-occipital region.  After discussion with family and patient, it is apparent that patient has been a very independent person and does not  wish to live with significant disability.  Psychiatry consulted, stated that patient is difficult to evaluate given hearing loss but that according to family, he has always emphasized quality of life.  Will consult palliative care to discuss [place] and options for patient.  Stroke: expansion of stroke in left MCA and PCA with new areas of ischemia likely due to embolism even with Eliquis vs. Intra- and extracranial stenosis in the setting of soft BP CT head Patchy multifocal acute to early subacute ischemic infarcts in bilateral cerebral and cerebellar hemispheres, atrophy and microvascular ischemic disease CTA head & neck no LVO or emergent finding, 50% stenosis of left common carotid artery, 50% stenosis of right ICA, bilateral vertebral artery stenosis MRI  expansion of ischemic changes in posterior left cerebral hemisphere with multiple new areas of ischemia in left parieto-occipital region and right cerebral hemisphere, atrophy and chronic ischemic changes 2D Echo EF 40-45%, global hypokinesis of left ventricle, mildly dilated left atrium, small PFO with bidirectional shunting LDL 43 HgbA1c 7.7 VTE prophylaxis - fully anticoagulated with Eliquis Eliquis (apixaban) daily prior to admission, now on Eliquis (apixaban) daily.  Therapy recommendations:  SNF  Disposition:  pending, palliative care on board  History of stroke 07/23/2021 admitted for altered mental status and  fever.  MRI showed embolic shower.  CT head and neck right ICA 50% stenosis, 65% brachiocephalic artery stenosis, left CCA 50% stenosis, bilateral siphon atherosclerosis.  EF 40 to 45%.  EEG no seizure.  LDL 45, A1c 7.7.  Blood culture negative.  LE venous Doppler showed left peroneal vein DVT, CTA chest showed positive PE.  TEE showed large PFO but no endocarditis.  Patient discharged on Eliquis and Crestor 20.  Hypertension Home meds:  atenolol 50 mg daily Stable Alejo-term BP goal normotensive  Hyperlipidemia Home meds:  rosuvastatin 20 mg daily, resumed in hospital LDL 43, goal < 70 Continue statin at discharge  Diabetes type II Uncontrolled Home meds:  none HgbA1c 7.7, goal < 7.0 CBGs SSI  Other Stroke Risk Factors Advanced Age >/= 62  Former cigarette smoker Hx stroke Coronary artery disease Congestive heart failure  Other Active Problems DVT and PE, Continue anticoagulation with Eliquis Suicidal ideation  Hospital day # [place] , MSN, AGACNP-BC [facility] See Amion for schedule and pager information 07/30/2021 1:18 PM  ATTENDING NOTE: I reviewed above note and agree with the assessment and plan. Pt was seen and examined.   81 year old male with history of CAD status post CABG, PE and DVT on Eliquis, hypertension, hyperlipidemia, right eye blind, left eye vision loss, hard of hearing, recent stroke admitted for worsening right-sided weakness, abnormal facial movement.   Patient had recent admission on 07/23/2021 with altered mental status and fever.  MRI showed embolic shower.  CT head and neck right ICA 50% stenosis, 78% brachiocephalic artery stenosis, left CCA 50% stenosis, bilateral siphon atherosclerosis.  EF 40 to 45%.  EEG no seizure.  LDL 45, A1c 7.7.  Blood culture negative.  LE venous Doppler showed left peroneal vein DVT, CTA chest showed positive PE.  TEE showed large PFO but no endocarditis.  Patient discharged on Eliquis and  Crestor 20.  On this admission, MRI showed extension of left MCA and PCA infarcts.  CTA head and neck similar as last admission but more prominent severe bilateral siphon atherosclerosis.  CT C/A/P bilateral pneumonia.  Creatinine 0.89.  Etiology for patient extension of stroke likely due to severe intracranial and extracranial stenosis in  the setting of soft BP at home.  Currently heparin IV switched back to Eliquis.  On Crestor.  However patient developed suicidal ideation since last discharge, and currently palliative care on board to discuss with patient and family about [place].  Of note, now pt in comfort care measures.   For detailed assessment and plan, please refer to above as I have made changes wherever appropriate.   Neurology will sign off. Please call with questions. Thanks for the consult.   Rosalin Hawking, MD PhD Stroke Neurology 07/30/2021 5:42 PM    To contact Stroke Continuity provider, please refer to [email or web address] After hours, contact General Neurology

## 2021-07-31 DIAGNOSIS — I639 Cerebral infarction, unspecified: Secondary | ICD-10-CM | POA: Diagnosis not present

## 2021-07-31 DIAGNOSIS — Z515 Encounter for palliative care: Secondary | ICD-10-CM | POA: Diagnosis not present

## 2021-07-31 DIAGNOSIS — Z7189 Other specified counseling: Secondary | ICD-10-CM | POA: Diagnosis not present

## 2021-07-31 MED ORDER — LORAZEPAM 2 MG/ML PO CONC: 1.0000 mg | ORAL | 0 refills | Status: AC | PRN

## 2021-07-31 MED ORDER — ONDANSETRON 4 MG PO TBDP: 4.0000 mg | ORAL_TABLET | Freq: Four times a day (QID) | ORAL | 0 refills | Status: AC | PRN

## 2021-07-31 MED ORDER — POLYVINYL ALCOHOL 1.4 % OP SOLN: 1.0000 [drp] | Freq: Four times a day (QID) | OPHTHALMIC | 0 refills | Status: AC | PRN

## 2021-07-31 MED ORDER — GLYCOPYRROLATE 0.2 MG/ML IJ SOLN: 0.2000 mg | INTRAMUSCULAR | Status: AC | PRN

## 2021-07-31 MED ORDER — GLYCOPYRROLATE 1 MG PO TABS: 1.0000 mg | ORAL_TABLET | ORAL | Status: AC | PRN

## 2021-07-31 MED ORDER — MORPHINE SULFATE (CONCENTRATE) 10 MG/0.5ML PO SOLN: 5.0000 mg | ORAL | 0 refills | Status: AC | PRN

## 2021-07-31 MED ORDER — LORAZEPAM 1 MG PO TABS: 1.0000 mg | ORAL_TABLET | ORAL | 0 refills | Status: AC | PRN

## 2021-07-31 MED ORDER — ONDANSETRON HCL 4 MG/2ML IJ SOLN: 4.0000 mg | Freq: Four times a day (QID) | INTRAMUSCULAR | 0 refills | Status: AC | PRN

## 2021-07-31 MED ORDER — HALOPERIDOL 0.5 MG PO TABS: 0.5000 mg | ORAL_TABLET | ORAL | Status: AC | PRN

## 2021-07-31 MED ORDER — HALOPERIDOL LACTATE 2 MG/ML PO CONC: 0.5000 mg | ORAL | 0 refills | Status: AC | PRN

## 2021-07-31 MED ORDER — BIOTENE DRY MOUTH MT LIQD: 15.0000 mL | OROMUCOSAL | Status: AC | PRN

## 2021-07-31 MED ORDER — ACETAMINOPHEN 325 MG PO TABS: 650.0000 mg | ORAL_TABLET | Freq: Four times a day (QID) | ORAL | Status: AC | PRN

## 2021-07-31 MED ORDER — HALOPERIDOL LACTATE 5 MG/ML IJ SOLN: 0.5000 mg | INTRAMUSCULAR | Status: AC | PRN

## 2021-07-31 MED ORDER — LORAZEPAM 2 MG/ML IJ SOLN: 1.0000 mg | INTRAMUSCULAR | 0 refills | Status: AC | PRN

## 2021-07-31 MED ORDER — SENNOSIDES-DOCUSATE SODIUM 8.6-50 MG PO TABS: 1.0000 | ORAL_TABLET | Freq: Every evening | ORAL | Status: AC | PRN

## 2021-07-31 MED ORDER — ACETAMINOPHEN 650 MG RE SUPP: 650.0000 mg | Freq: Four times a day (QID) | RECTAL | 0 refills | Status: AC | PRN

## 2021-07-31 NOTE — TOC Transition Note (Signed)
Transition of Care [facility]) - CM/SW Discharge Note   Patient Details  Name: Xavier Giles MRN: [id number] Date of Birth: Feb 23, 1941  Transition of Care [facility]) CM/SW Contact:  Geralynn Ochs, [place] Phone Number: 07/31/2021, 12:22 PM   Clinical Narrative:   CSW participate in conference call this morning to discuss patient's case and confirm hospice eligibility and bed availability. Hospice can admit today, transport arranged with [facility] for next available. Family in agreement, consents have been completed.   Nurse to call report to [phone].    Final next level of care: [place] Barriers to Discharge: Barriers Resolved   Patient Goals and CMS Choice        Discharge Placement              Patient chooses bed at:  [facility]) Patient to be transferred to facility by: [place] Name of family member notified: [name] Patient and family notified of of transfer: 07/31/21  Discharge Plan and Services                                     Social Determinants of Health (SDOH) Interventions     Readmission Risk Interventions     View : No data to display.

## 2021-07-31 NOTE — Progress Notes (Signed)
Manufacturing [employer] [facility]) Hospital Liaison Note  Bed offered and accepted for transfer today to [facility]. Unit RN please call report to [phone] prior to the patient leaving. Please send signed DNR and paperwork.  Please call with any questions or concerns. Thank you  Roselee Nova, [place] Hospital Liaison  [phone]

## 2021-07-31 NOTE — Progress Notes (Signed)
Palliative: Mr. Morozov is lying quietly in bed.  He appears acutely/chronically ill and quite frail.  He does not interact with me in any meaningful way today.  I do not believe that he can make his basic needs known.  His son, John/Eddie, is present at bedside.  We talk about residential hospice, comfort and dignity at end-of-life.  Jenny Reichmann is experienced with residential hospice care with his grandparents and his mother.  He shares a story of relatively quick decline for Mr. Kolodziejski who had been independent prior to the stroke.  John shares that Mr. Yankowski has always made it clear that he valued quality over quantity.  He shares that his father is very distraught that he is no longer able to care for his own physical needs.  He shares that Mr. Wieneke has made it clear that he is ready to die.  Conference with attending, bedside nursing staff, transition of care team, local hospice liaison rated related to patient condition, needs, disposition. DNR/goldenrod form completed and placed on chart.  Plan: Comfort and dignity at end-of-life, requesting residential hospice at [facility] Prognosis: 10 to 14 days or less anticipated based on acute stroke, chronic illness burden, poor by mouth intake, patient and family's desire to focus on comfort and dignity "let nature take its course".   43 minutes Quinn Axe, NP Palliative medicine team Team phone [phone] Greater than 50% of this time was spent counseling and coordinating care related to the above assessment and plan.

## 2021-07-31 NOTE — Discharge Summary (Addendum)
Name: Xavier Giles MRN: [id number] DOB: 11/26/1940 81 y.o. PCP: [name], MD  Date of Admission: 07/29/2021  3:29 PM Date of Discharge: 07/31/21 Attending Physician: Dr. Philipp Ovens  Discharge Diagnosis: Acute CVA (cerebrovascular accident) [facility])  End of life care     Discharge Medications: Allergies as of 07/31/2021       Reactions   Keflex [cephalexin] Other (See Comments)   Unknown ;   Patient states he is not aware of being allergic to cephalexin per [facility]MT[facility] resident MD report.     Zocor [simvastatin] Other (See Comments)   Unknown    Latex Rash   Lipitor [atorvastatin] Other (See Comments)   Elevated blood sugar   Sulfa Antibiotics Nausea Only        Medication List     STOP taking these medications    amoxicillin-clavulanate 875-125 MG tablet Commonly known as: AUGMENTIN   apixaban 5 MG Tabs tablet Commonly known as: ELIQUIS   atenolol 50 MG tablet Commonly known as: TENORMIN   isosorbide mononitrate 60 MG 24 hr tablet Commonly known as: IMDUR   rosuvastatin 20 MG tablet Commonly known as: CRESTOR       TAKE these medications    acetaminophen 325 MG tablet Commonly known as: TYLENOL Take 2 tablets (650 mg total) by mouth every 6 (six) hours as needed for mild pain (or Fever >/= 101).   acetaminophen 650 MG suppository Commonly known as: TYLENOL Place 1 suppository (650 mg total) rectally every 6 (six) hours as needed for mild pain (or Fever >/= 101).   antiseptic oral rinse Liqd Apply 15 mLs topically as needed for dry mouth.   glycopyrrolate 1 MG tablet Commonly known as: ROBINUL Take 1 tablet (1 mg total) by mouth every 4 (four) hours as needed (excessive secretions).   glycopyrrolate 0.2 MG/ML injection Commonly known as: ROBINUL Inject 1 mL (0.2 mg total) into the skin every 4 (four) hours as needed (excessive secretions).   glycopyrrolate 0.2 MG/ML injection Commonly known as: ROBINUL Inject 1 mL (0.2 mg total) into the vein  every 4 (four) hours as needed (excessive secretions).   haloperidol 0.5 MG tablet Commonly known as: HALDOL Take 1 tablet (0.5 mg total) by mouth every 4 (four) hours as needed for agitation (or delirium).   haloperidol 2 MG/ML solution Commonly known as: HALDOL Place 0.3 mLs (0.6 mg total) under the tongue every 4 (four) hours as needed for agitation (or delirium).   haloperidol lactate 5 MG/ML injection Commonly known as: HALDOL Inject 0.1 mLs (0.5 mg total) into the vein every 4 (four) hours as needed (or delirium).   LORazepam 1 MG tablet Commonly known as: ATIVAN Take 1 tablet (1 mg total) by mouth every 4 (four) hours as needed for anxiety.   LORazepam 2 MG/ML concentrated solution Commonly known as: ATIVAN Place 0.5 mLs (1 mg total) under the tongue every 4 (four) hours as needed for anxiety.   LORazepam 2 MG/ML injection Commonly known as: ATIVAN Inject 0.5 mLs (1 mg total) into the vein every 4 (four) hours as needed for anxiety.   morphine CONCENTRATE 10 MG/0.5ML Soln concentrated solution Take 0.25 mLs (5 mg total) by mouth every 2 (two) hours as needed for moderate pain (or dyspnea).   morphine CONCENTRATE 10 MG/0.5ML Soln concentrated solution Place 0.25 mLs (5 mg total) under the tongue every 2 (two) hours as needed for moderate pain (or dyspnea).   ondansetron 4 MG disintegrating tablet Commonly known as: ZOFRAN-ODT Take  1 tablet (4 mg total) by mouth every 6 (six) hours as needed for nausea.   ondansetron 4 MG/2ML Soln injection Commonly known as: ZOFRAN Inject 2 mLs (4 mg total) into the vein every 6 (six) hours as needed for nausea.   polyvinyl alcohol 1.4 % ophthalmic solution Commonly known as: LIQUIFILM TEARS Place 1 drop into both eyes 4 (four) times daily as needed for dry eyes.   senna-docusate 8.6-50 MG tablet Commonly known as: Senokot-S Take 1 tablet by mouth at bedtime as needed for mild constipation.        Disposition and follow-up:    Mr.Xavier Giles was discharged from [facility] in [place] condition.  At the hospital follow up visit please address:  1.  Follow-up: None  2.  Labs / imaging needed at time of follow-up: None   3.  Pending labs/ test needing follow-up: None  4.  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 Then, he was discharged on eliquis regimen which he endorses taking as prescribed. He presented here several days after his discharge with suicidal ideations. He was HDS and majority of his workup was unremarkable (UDS, ethanol) with the exception of worsening infarcts - MRI showed new CVA predominantly in left ICA distribution. Due to concern for underlying malignancy, Full body imaging was obtained and found to be reassuring for no signs of malignancy.  Neurology was consulted and initially started the patient on heparin low goal no bolus protocol. Also restarted his home crestor. The following day, patient was transitioned back to his home eliquis (unclear if pt has actually had apixaban failure given short amount of time he has been on this medication).   PT, OT, and SLP evaluated the patient and recommended SNF. After psychiatry evaluation (see below for more info), palliative care was consulted. Patient was transitioned to comfort care on 05/31 with plan for discharge to residential hospice at [facility].   #Depression #Suicidal ideations Prior to the patient's hospitalization he was very active and lived on his own. The patient had multiple strokes prior to his last admission and was also found to have DVT, PE, and CAP. Since his discharge, the patient's family states that he has been very depressed and has expressed wanting to end  his life. He has asked for his gun and has been refusing to eat, with intent to die. The patient was also found with scissors in his pants and intended to hurt himself. Psychiatry was consulted and recommended a 1:1 sitter for observation and initiation of sertraline 25 mg. After psychiatry discussion with other family members, made a decision against psychiatric hospitalization as the pt's suicidality was clearly expression of Truss-standing underlying values, and harms of psychiatric hospitalization (time away from family, increased likelyhood of care over objection/need for restraints, etc) do not outweigh benefits.    #RLL pulmonary embolism #DVT of LLE During last admission, patient was diagnosed with DVT and PE, both of which were thought to be secondary to prolonged immobilization after recent infection (pneumonia), although the possibility of malignancy was also considered. Recent imaging however has been reassuring for no signs of malignancy. Adherent with eliquis at home. Started heparin and transitioned back to home eliquis as mentioned above.   #  Hypertension Systolic BP's were normotensive for the majority of this hospitalization with goal normotensive after his CVAs. He was continued on his home atenolol regimen throughout this hospitalization.    #Elevated transaminases, improving AST/ALT were elevated during the patient's last admission and this was thought to be secondary to sepsis. RUQ [place] was obtained at that time and showed no evidence of liver parenchymal pathology. AST/ALT continue to downtrend on daily CMPs obtained in the hospital.   #CAD s/p CABG and PCIs Patient has been taking eliquis 5 mg bid since his brillinta was discontinued during his last hospitalization. On heparin for one day and subsequently transitioned back to eliquis.   #Diabetes A1c 7.7. Patient does not take any medications for this currently.      Discharge Subjective: He appeared calm and weak. He denies  any pain at this time.  Discharge Exam:   BP (!) 134/57 (BP Location: Right Arm)   Pulse 66   Temp 98.2 F (36.8 C) (Oral)   Resp 18   Ht '5\' 11"'$  (1.803 m)   Wt 77.3 kg   SpO2 98%   BMI 23.77 kg/m  Constitutional: ill-appearing elderly gentleman, lying in bed, in no acute distress HENT: normocephalic atraumatic, mucous membranes moist Eyes: conjunctiva non-erythematous Neck: supple Cardiovascular: regular rate and rhythm, no m/r/g Pulmonary/Chest: normal work of breathing on room air Abdominal: soft, non-tender, non-distended MSK: normal bulk and tone Neurological: alert & oriented x 2, fatigue; easily aroused  Skin: warm and dry Psych: Answer simple questions   Pertinent Labs, Studies, and Procedures:     Latest Ref Rng & Units 07/30/2021    3:09 AM 07/29/2021    4:20 PM 07/26/2021    3:53 AM  CBC  WBC 4.0 - 10.5 K/uL 10.1   11.3   13.5    Hemoglobin 13.0 - 17.0 g/dL 13.6   14.2   14.5    Hematocrit 39.0 - 52.0 % 41.7   43.7   43.5    Platelets 150 - 400 K/uL 304   323   291         Latest Ref Rng & Units 07/30/2021    3:09 AM 07/29/2021    4:20 PM 07/26/2021    3:53 AM  CMP  Glucose 70 - 99 mg/dL 105   113   191    BUN 8 - 23 mg/dL '16   17   21    '$ Creatinine 0.61 - 1.24 mg/dL 0.89   1.10   1.02    Sodium 135 - 145 mmol/L 138   137   133    Potassium 3.5 - 5.1 mmol/L 3.9   3.7   3.7    Chloride 98 - 111 mmol/L 107   103   102    CO2 22 - 32 mmol/L '22   24   20    '$ Calcium 8.9 - 10.3 mg/dL 8.1   8.3   7.7    Total Protein 6.5 - 8.1 g/dL 5.1   5.4   5.3    Total Bilirubin 0.3 - 1.2 mg/dL 0.9   1.1   0.7    Alkaline Phos 38 - 126 U/L 90   99   94    AST 15 - 41 U/L 37   44   61    ALT 0 - 44 U/L 69   81   119      CT ANGIO HEAD NECK W WO CM  Result Date: 07/30/2021 CLINICAL  DATA:  Follow-up examination for acute stroke. EXAM: CT ANGIOGRAPHY HEAD AND NECK TECHNIQUE: Multidetector CT imaging of the head and neck was performed using the standard protocol during bolus  administration of intravenous contrast. Multiplanar CT image reconstructions and MIPs were obtained to evaluate the vascular anatomy. Carotid stenosis measurements (when applicable) are obtained utilizing NASCET criteria, using the distal internal carotid diameter as the denominator. RADIATION DOSE REDUCTION: This exam was performed according to the departmental dose-optimization program which includes automated exposure control, adjustment of the mA and/or kV according to patient size and/or use of iterative reconstruction technique. CONTRAST:  70m OMNIPAQUE IOHEXOL 350 MG/ML SOLN COMPARISON:  Prior study from earlier the same day as well as previous exam from 07/24/2021. FINDINGS: CT HEAD FINDINGS Brain: Atrophy with chronic microvascular ischemic disease and multiple remote cerebellar infarcts. Evolving acute to early subacute ischemic changes involving the left greater than right cerebral and cerebellar hemispheres, better characterized on prior brain MRI. No associated hemorrhage evident by CT. No significant regional mass effect. No mass lesion or midline shift. No hydrocephalus or extra-axial fluid collection. Vascular: No hyperdense vessel. Calcified atherosclerosis present at skull base. Skull: No new finding. Sinuses: Paranasal sinuses are clear. Small right mastoid effusion noted. Orbits: Globes orbital soft tissues demonstrate no acute finding. Review of the MIP images confirms the above findings CTA NECK FINDINGS Aortic arch: Visualized aortic arch normal caliber with normal 3 vessel morphology. Moderate atheromatous change about the arch and origin of the great vessels. 50% stenoses involving the origins of the brachiocephalic and left common carotid arteries, with 40% stenosis involving the proximal left subclavian artery, stable. Right carotid system: Right CCA remains patent to the bifurcation. Estimated 50% stenosis involving the proximal cervical right ICA noted, unchanged. Right ICA remains  patent distally without new or progressive finding. Left carotid system: 50% stenosis at the origin of the left CCA. Eccentric calcified plaque about the left carotid bulb/proximal left ICA without hemodynamically significant stenosis. No evidence for dissection or occlusion. Flow within the left ICA is somewhat attenuated as compared to the contralateral right ICA on today's exam. Vertebral arteries: Both vertebral arteries arise from the subclavian arteries. 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 50% stenosis at the origin of the right brachiocephalic artery. Atheromatous change involving the proximal left vertebral artery with associated moderate to severe left V1 stenoses, with more mild to moderate distal left V2 and V3 stenoses. Mild atheromatous narrowing at the origin of the right vertebral artery. Overall, appearance is stable from prior. No evidence for dissection or interval occlusion. Skeleton: No discrete or worrisome osseous lesions. Median sternotomy wires noted. Patient is edentulous. Other neck: No other acute soft tissue abnormality within the neck. Few scattered thyroid nodules measuring up to 1 cm noted, doubtful significance given size and patient age, no follow-up imaging recommended (ref: J Am Coll Radiol. 2015 Feb;12(2): 143-50). Upper chest: Emphysema. Multifocal consolidative opacities involving the visualized right greater than left upper lobes, consistent with pneumonia. Tiny filling defect within a right-sided subsegmental pulmonary artery, consistent with previously identified pulmonary emboli. Review of the MIP images confirms the above findings CTA HEAD FINDINGS Anterior circulation: Petrous segments remain patent. Extensive atheromatous change throughout the carotid siphons with associated moderate to severe multifocal narrowing, right worse than left, stable. A1 segments patent. Normal anterior communicating artery complex. Anterior cerebral arteries remain patent to their  distal aspects without significant stenosis. No M1 stenosis or occlusion. No proximal MCA branch occlusion. Distal MCA branches perfused and symmetric. Distal  small vessel atheromatous irregularity noted. Posterior circulation: Both vertebral arteries patent as they course into the cranial vault. Atheromatous change about the proximal right V4 segment with associated moderate stenosis. Atheromatous change about the left V4 segment with associated moderate to severe multifocal stenoses, stable. Both PICA remain patent. Basilar remains patent to its distal aspect without stenosis superior cerebellar arteries remain patent bilaterally. Both PCAs primarily supplied via the basilar. Short-segment severe right P2 stenosis, stable. PCAs otherwise patent to their distal aspects without significant stenosis. Venous sinuses: Grossly patent allowing for timing the contrast bolus. Anatomic variants: None significant.  No aneurysm. Review of the MIP images confirms the above findings IMPRESSION: CT HEAD IMPRESSION: 1. Patchy multifocal evolving acute to early subacute ischemic infarcts involving the left greater than right cerebral and cerebellar hemispheres, better characterized on recent brain MRI. No associated hemorrhage by CT. No significant regional mass effect. 2. Underlying atrophy with chronic microvascular ischemic disease, with multiple remote cerebellar infarcts. CTA HEAD AND NECK IMPRESSION: 1. Stable CTA of the head and neck as compared to 07/24/2021. No large vessel occlusion or other interval emergent finding. 2. Atheromatous disease about the major arterial vasculature of the neck with associated 50% stenoses at the origins of the brachiocephalic and left common carotid arteries, 40% proximal left subclavian artery stenosis, and 50% stenosis involving the proximal cervical right ICA. Moderate to severe multifocal left vertebral artery stenoses, most pronounced at the V1 segment. 3. Intracranial atherosclerotic  disease with associated moderate to severe stenoses about the right greater than left carotid siphons, left greater than right V4 segments, and right P2 segment. 4. Multifocal consolidative opacities about the right greater than left upper lobes, consistent with pneumonia. 5. Tiny filling defect within a visualized right-sided subsegmental pulmonary artery, consistent with previously identified pulmonary emboli. 6.  Emphysema (ICD10-J43.9). Electronically Signed   By: Jeannine Boga M.D.   On: 07/30/2021 02:36   MR BRAIN WO CONTRAST  Addendum Date: 07/30/2021   ADDENDUM REPORT: 07/30/2021 07:51 ADDENDUM: In addition to the initially described findings, a few additional new subcentimeter foci of acute ischemia noted within the contralateral right cerebral hemisphere as well, involving the right caudate and right parieto-occipital region. Electronically Signed   By: Jeannine Boga M.D.   On: 07/30/2021 07:51   Result Date: 07/30/2021 CLINICAL DATA:  Initial evaluation for mental status change, unknown cause. EXAM: MRI HEAD WITHOUT CONTRAST TECHNIQUE: Multiplanar, multiecho pulse sequences of the brain and surrounding structures were obtained without intravenous contrast. COMPARISON:  Recent MRI from 07/23/2021. FINDINGS: Brain: There has been continued interval evolution of previously identified multifocal ischemic infarcts involving the left greater than right cerebral and cerebellar hemispheres. Since previous exam, there has been interval expansion of acute ischemic changes involving the posterior left cerebral hemisphere, with multiple new areas of ischemia involving the left parieto-occipital region (series 5, images 89, 78). The remainder of the evolving ischemic changes are otherwise relatively similar. Evidence for associated petechial hemorrhage at the posterior left frontoparietal region (series 14, image 37) Heidelberg classification 1a: HI1, scattered small petechiae, no mass effect. No  frank hemorrhagic transformation by MRI. Underlying atrophy with chronic microvascular ischemic disease again noted. Few scattered remote cerebellar infarcts noted as well. No mass lesion, significant mass effect, or midline shift. No hydrocephalus or extra-axial fluid collection. Pituitary gland suprasellar region within normal limits. Midline structures intact and normally formed. Vascular: Circumferential T2/FLAIR signal abnormality now seen about the distal cervical left ICA, new from prior (series 10, image 1), and  could reflect changes of a proximal stenosis or possibly dissection. Major intracranial vascular flow voids are otherwise maintained. Skull and upper cervical spine: Craniocervical junction within normal limits. Bone marrow signal intensity normal. No scalp soft tissue abnormality. Sinuses/Orbits: Prior bilateral ocular lens replacement. Paranasal sinuses are clear. Small right greater than left mastoid effusions noted. Other: None. IMPRESSION: 1. Interval expansion of acute ischemic changes involving the posterior left cerebral hemisphere, with multiple new areas of ischemia involving the left parieto-occipital region. Associated petechial hemorrhage without frank hemorrhagic transformation. 2. Otherwise normal expected interval evolution of previously identified multifocal ischemic infarcts involving the left greater than right cerebral and cerebellar hemispheres. 3. Circumferential T2/FLAIR signal abnormality about the distal cervical left ICA, new from prior. Finding could reflect changes of a proximal stenosis or possibly dissection. Further evaluation with dedicated CTA and/or MRA suggested for further evaluation. 4. Underlying atrophy with chronic ischemic changes as above. Electronically Signed: By: Jeannine Boga M.D. On: 07/29/2021 20:04   CT CHEST ABDOMEN PELVIS W CONTRAST  Result Date: 07/30/2021 CLINICAL DATA:  Metastatic disease evaluation. EXAM: CT CHEST, ABDOMEN, AND PELVIS  WITH CONTRAST TECHNIQUE: Multidetector CT imaging of the chest, abdomen and pelvis was performed following the standard protocol during bolus administration of intravenous contrast. RADIATION DOSE REDUCTION: This exam was performed according to the departmental dose-optimization program which includes automated exposure control, adjustment of the mA and/or kV according to patient size and/or use of iterative reconstruction technique. CONTRAST:  59m OMNIPAQUE IOHEXOL 350 MG/ML SOLN COMPARISON:  07/22/2021 FINDINGS: CT CHEST FINDINGS Cardiovascular: Diffuse aortic atherosclerosis. Heart is normal size. Aorta is normal caliber. Prior CABG. Small right lower lobe pulmonary embolus again noted as seen on prior CT. Mediastinum/Nodes: Small scattered mediastinal lymph nodes, none pathologically enlarged. No mediastinal, hilar, or axillary adenopathy. Trachea and esophagus are unremarkable. Thyroid unremarkable. Lungs/Pleura: Moderate centrilobular emphysema. Continued extensive airspace disease throughout the right upper lobe, now also seen in the left upper lobe compatible with pneumonia. No effusions. Musculoskeletal: Chest wall soft tissues are unremarkable. No acute bony abnormality. CT ABDOMEN PELVIS FINDINGS Hepatobiliary: Small gallstones layering within the gallbladder, unchanged. No focal hepatic abnormality. Pancreas: No focal abnormality or ductal dilatation. Spleen: Areas of wedge-shaped low densities throughout the spleen compatible with splenic infarcts. Adrenals/Urinary Tract: Bilateral renal simple cysts. Left nephrolithiasis. No ureteral stones or hydronephrosis. Urinary bladder unremarkable. Stomach/Bowel: Normal appendix. Sigmoid diverticulosis. No active diverticulitis. Stomach and small bowel decompressed, unremarkable. Vascular/Lymphatic: Heavily calcified aorta and iliac vessels. No evidence of aneurysm or adenopathy. Reproductive: No visible focal abnormality. Other: No free fluid or free air.  Musculoskeletal: Degenerative changes in the lumbar spine. No acute bony abnormality. IMPRESSION: Continued extensive consolidation in the right upper lobe. New areas of consolidation in the left upper lobe. Findings compatible with pneumonia. Moderate to severe emphysema. Areas of wedge-shaped low-density throughout the spleen compatible with splenic infarcts. These are better seen on today's study than prior study but at least some of these were present on prior study. Cholelithiasis. Left nephrolithiasis. Sigmoid diverticulosis. Electronically Signed   By: KRolm BaptiseM.D.   On: 07/30/2021 00:17   DG Chest Port 1 View  Result Date: 07/29/2021 CLINICAL DATA:  Weakness EXAM: PORTABLE CHEST 1 VIEW COMPARISON:  Chest x-ray dated Jul 22, 2021 FINDINGS: Cardiac and mediastinal contours are unchanged within normal limits status post sternotomy and CABG. Unchanged consolidation of the right upper lobe. No new parenchymal opacity. No large pleural effusion or pneumothorax. IMPRESSION: Unchanged right upper lobe consolidation. No new parenchymal  opacity. Recommend radiographic follow-up to ensure resolution. Electronically Signed   By: Yetta Glassman M.D.   On: 07/29/2021 16:30     Discharge Instructions: Discharge Instructions     Call MD for:  difficulty breathing, headache or visual disturbances   Complete by: As directed    Call MD for:  severe uncontrolled pain   Complete by: As directed    Diet - low sodium heart healthy   Complete by: As directed    Discharge instructions   Complete by: As directed    Mr. Morandi,  It was a pleasure taking care of you during this admission.  We will transfer you [facility] for continuing of care.  Dr. Alfonse Spruce   Increase activity slowly   Complete by: As directed       Discharged to [facility]  Signed: Timothy Lasso, MD Internal Medicine Resident Pager: [phone]

## 2021-08-30 DEATH — deceased

## 2021-09-25 ENCOUNTER — Encounter (INDEPENDENT_AMBULATORY_CARE_PROVIDER_SITE_OTHER): Payer: Medicare Other | Admitting: Ophthalmology
